# Patient Record
Sex: Female | Born: 1935
Health system: Southern US, Community
[De-identification: ages and names within clinical notes are randomized; demographics above are authoritative.]

## PROBLEM LIST (undated history)

## (undated) DIAGNOSIS — M722 Plantar fascial fibromatosis: Secondary | ICD-10-CM

## (undated) DIAGNOSIS — J45909 Unspecified asthma, uncomplicated: Secondary | ICD-10-CM

## (undated) DIAGNOSIS — I5189 Other ill-defined heart diseases: Secondary | ICD-10-CM

## (undated) DIAGNOSIS — E785 Hyperlipidemia, unspecified: Secondary | ICD-10-CM

## (undated) DIAGNOSIS — I2510 Atherosclerotic heart disease of native coronary artery without angina pectoris: Secondary | ICD-10-CM

## (undated) DIAGNOSIS — Z780 Asymptomatic menopausal state: Secondary | ICD-10-CM

## (undated) DIAGNOSIS — M1990 Unspecified osteoarthritis, unspecified site: Secondary | ICD-10-CM

## (undated) DIAGNOSIS — K219 Gastro-esophageal reflux disease without esophagitis: Secondary | ICD-10-CM

## (undated) DIAGNOSIS — L920 Granuloma annulare: Secondary | ICD-10-CM

## (undated) DIAGNOSIS — N1832 Chronic kidney disease, stage 3b: Secondary | ICD-10-CM

## (undated) DIAGNOSIS — R0600 Dyspnea, unspecified: Secondary | ICD-10-CM

## (undated) DIAGNOSIS — I480 Paroxysmal atrial fibrillation: Secondary | ICD-10-CM

## (undated) DIAGNOSIS — I10 Essential (primary) hypertension: Secondary | ICD-10-CM

## (undated) HISTORY — DX: Paroxysmal atrial fibrillation: I48.0

## (undated) HISTORY — DX: Plantar fascial fibromatosis: M72.2

## (undated) HISTORY — DX: Other ill-defined heart diseases: I51.89

## (undated) HISTORY — PX: APPENDECTOMY: SHX54

## (undated) HISTORY — DX: Granuloma annulare: L92.0

## (undated) HISTORY — DX: Atherosclerotic heart disease of native coronary artery without angina pectoris: I25.10

## (undated) HISTORY — DX: Hyperlipidemia, unspecified: E78.5

## (undated) HISTORY — PX: CATARACT EXTRACTION, BILATERAL: SHX1313

## (undated) HISTORY — PX: DILATION AND CURETTAGE OF UTERUS: SHX78

## (undated) HISTORY — DX: Essential (primary) hypertension: I10

## (undated) HISTORY — PX: VENOUS ABLATION: SHX2656

## (undated) HISTORY — DX: Asymptomatic menopausal state: Z78.0

---

## 2003-05-07 ENCOUNTER — Other Ambulatory Visit: Payer: Self-pay

## 2004-06-08 ENCOUNTER — Encounter: Payer: Self-pay | Admitting: Rheumatology

## 2004-07-06 ENCOUNTER — Encounter: Payer: Self-pay | Admitting: Rheumatology

## 2004-08-06 ENCOUNTER — Encounter: Payer: Self-pay | Admitting: Rheumatology

## 2005-04-02 ENCOUNTER — Ambulatory Visit: Payer: Self-pay | Admitting: Endocrinology

## 2005-07-08 ENCOUNTER — Ambulatory Visit: Payer: Self-pay | Admitting: Endocrinology

## 2005-07-11 ENCOUNTER — Other Ambulatory Visit: Payer: Self-pay

## 2005-07-11 ENCOUNTER — Emergency Department: Payer: Self-pay | Admitting: Unknown Physician Specialty

## 2005-07-12 ENCOUNTER — Ambulatory Visit: Payer: Self-pay | Admitting: General Practice

## 2005-08-03 ENCOUNTER — Inpatient Hospital Stay: Payer: Self-pay | Admitting: Endocrinology

## 2006-11-25 LAB — HM COLONOSCOPY: HM Colonoscopy: NORMAL

## 2007-08-11 ENCOUNTER — Ambulatory Visit: Payer: Self-pay | Admitting: Podiatry

## 2008-03-08 HISTORY — PX: SPINE SURGERY: SHX786

## 2008-06-16 ENCOUNTER — Encounter: Admission: RE | Admit: 2008-06-16 | Discharge: 2008-06-16 | Payer: Self-pay | Admitting: Neurosurgery

## 2008-07-04 ENCOUNTER — Inpatient Hospital Stay (HOSPITAL_COMMUNITY): Admission: RE | Admit: 2008-07-04 | Discharge: 2008-07-08 | Payer: Self-pay | Admitting: Neurosurgery

## 2009-03-05 ENCOUNTER — Ambulatory Visit: Payer: Self-pay | Admitting: Internal Medicine

## 2009-03-20 ENCOUNTER — Ambulatory Visit: Payer: Self-pay | Admitting: Internal Medicine

## 2009-07-15 ENCOUNTER — Ambulatory Visit: Payer: Self-pay | Admitting: Ophthalmology

## 2010-01-26 ENCOUNTER — Ambulatory Visit: Payer: Self-pay | Admitting: Internal Medicine

## 2010-01-27 LAB — HM PAP SMEAR

## 2010-06-16 LAB — URINALYSIS, ROUTINE W REFLEX MICROSCOPIC
Bilirubin Urine: NEGATIVE
Glucose, UA: NEGATIVE mg/dL
Hgb urine dipstick: NEGATIVE
Ketones, ur: NEGATIVE mg/dL
Nitrite: NEGATIVE
Protein, ur: NEGATIVE mg/dL
Specific Gravity, Urine: 1.029 (ref 1.005–1.030)
Urobilinogen, UA: 1 mg/dL (ref 0.0–1.0)
pH: 6 (ref 5.0–8.0)

## 2010-06-16 LAB — URINE MICROSCOPIC-ADD ON

## 2010-06-16 LAB — URINE CULTURE
Colony Count: NO GROWTH
Culture: NO GROWTH

## 2010-06-17 LAB — DIFFERENTIAL
Basophils Absolute: 0 10*3/uL (ref 0.0–0.1)
Eosinophils Absolute: 0.1 10*3/uL (ref 0.0–0.7)
Eosinophils Relative: 2 % (ref 0–5)
Lymphocytes Relative: 19 % (ref 12–46)
Lymphs Abs: 1.3 10*3/uL (ref 0.7–4.0)
Neutrophils Relative %: 71 % (ref 43–77)

## 2010-06-17 LAB — PROTIME-INR
INR: 1 (ref 0.00–1.49)
Prothrombin Time: 13.1 seconds (ref 11.6–15.2)

## 2010-06-17 LAB — CBC
HCT: 37.5 % (ref 36.0–46.0)
Hemoglobin: 13.1 g/dL (ref 12.0–15.0)
MCHC: 35 g/dL (ref 30.0–36.0)
MCV: 89.5 fL (ref 78.0–100.0)
RBC: 4.19 MIL/uL (ref 3.87–5.11)
WBC: 6.6 10*3/uL (ref 4.0–10.5)

## 2010-06-17 LAB — URINALYSIS, ROUTINE W REFLEX MICROSCOPIC
Bilirubin Urine: NEGATIVE
Glucose, UA: NEGATIVE mg/dL
Hgb urine dipstick: NEGATIVE
Specific Gravity, Urine: 1.01 (ref 1.005–1.030)
pH: 7 (ref 5.0–8.0)

## 2010-06-17 LAB — COMPREHENSIVE METABOLIC PANEL
CO2: 26 mEq/L (ref 19–32)
Calcium: 8.9 mg/dL (ref 8.4–10.5)
Chloride: 106 mEq/L (ref 96–112)
Creatinine, Ser: 1.05 mg/dL (ref 0.4–1.2)
GFR calc non Af Amer: 52 mL/min — ABNORMAL LOW (ref >60)
Glucose, Bld: 101 mg/dL — ABNORMAL HIGH (ref 70–99)
Total Bilirubin: 0.7 mg/dL (ref 0.3–1.2)

## 2010-07-21 NOTE — Op Note (Signed)
NAMEESTALEE, MCCANDLISH                ACCOUNT NO.:  [id number]   MEDICAL RECORD NO.:  [id number]          PATIENT TYPE:  INP   LOCATION:  3017                         FACILITY:  [facility]   PHYSICIAN:  Clydene Fake, M.D.  DATE OF BIRTH:  01-29-1936   DATE OF PROCEDURE:  07/04/2008  DATE OF DISCHARGE:                               OPERATIVE REPORT   PREOPERATIVE DIAGNOSES:  Lumbar stenosis, spondylosis, herniated nucleus  pulposus L3-4 and 4-5 with radiculopathy.   POSTOPERATIVE DIAGNOSES:  Lumbar stenosis, spondylosis, herniated  nucleus pulposus L3-4 and 4-5 with radiculopathy.   PROCEDURES:  Decompressive laminectomy decompressing the L3, L4, and L5  roots bilaterally (3 levels), left.  Bilateral L4-5 diskectomy,  microdissection with microscope.   SURGEON:  Clydene Fake, MD   ASSISTANT:  Hilda Lias, MD   ANESTHESIA:  General endotracheal tube anesthesia.   ESTIMATED BLOOD LOSS:  200 mL.   BLOOD GIVEN:  None.   DRAINS:  None.   COMPLICATIONS:  Dural tear occurred left side L4-5, repaired with  Tisseel tissue glue.   REASON FOR PROCEDURE:  The patient is a 75 year old woman with back and  leg pain, worse than the right, who has lumbar stenosis, spondylosis L3-  4 and L4-5 with a large chronic spondylotic disk protrusion at L4-5  centrally, also enhanced the stenosis with newer free fragment to the  right side causing a compression, the patient brought in for  decompression.   PROCEDURE IN DETAIL:  The patient was brought into the operating room  and general anesthesia was induced.  The patient was placed in prone  position on Wilson frame with all pressure points padded.  Needle was  placed in interspace.  X-rays obtained showing the needle was pointed at  the L4 spinous process.  An incision was then made centered where the  needle was.  Incision was taken down to the fascia.  Hemostasis was  obtained with Bovie cauterization.  The fascia was incised, L3, 4, and  5  spinous process and subperiosteal dissection was done over the 3, 4, and  5 spinous processes and lamina out to the facets bilaterally.  Self-  retaining retractor was placed.  Markers were placed at the 3-4 and 4-5  interspaces.  X-rays were obtained and this confirmed our positioning.  Starting at the left side, decompressive hemilaminectomies were started  first at 3-4 and 4-5.  A high-speed drill was used to remove the lamina  of medial facet and this completed with Kerrison punch, and ligamentum  flavum was removed to decompress the central canal.  Anterior  foraminotomy was done over the inferior root, it was done both at the 3-  4 and 4-5 space and then explored the nerve roots at 3-4 and 4-5 roots.  Starting at 3 and 4, roots were well decompressed and 4 was well  decompressed and 5 was impinged from the disk herniation there.  We  carefully dissected down the epidural space and disk space.  Large  subligamentous disk herniation seen and incised the disk space starting  the diskectomy.  This area was  very tight due to the central disk  herniation and as we started removing some disk material.  Retractor  caused a small tear in the dura and CSF was obtained.  When we removed  the retractor, no further CSF was seen.  We explored the area more and  saw the small dural tear, it was either a partial-thickness or the  arachnoid was left laying against it and no further CSF into this area.  At this point, we packed that with Gelfoam.  We went to the right side  and there we continued with the decompressive laminotomies at 3-4 and 4-  5.  Just like the opposite side, a high-speed drill with Kerrison  punches removing hypertrophic ligament of medial facets at 3-4 level.  We had good decompression of central canal and the 3 and 4 roots at 4-5  level.  We got good decompression of the central canal.  We did a wide  foraminotomy inferiorly at the L5 lamina and over the 5 root because it   was under significant compression, and 4 was decompressed, explored the  epidural space, and a subligamentous disk herniation and feeling up  under the L5 nerve root was a free fragment disk though this was  difficult to mobilize, and incising the disk space, we started  diskectomy and then reached up onto the L5 root and we were able to pull  fragments of disk out and removing this free fragment, we loosened up  the dura and then the nerve roots, we could retract a little bit easier.  We used curettes, Kerrison punches, and pituitary rongeurs to continue  pushing disk material back down the disk space, removing the disk, and  performing the diskectomy.  When we were finished, we had good central  decompression, good decompression of all canal, and the 4 and 5 roots on  the left side.  Good hemostasis with Gelfoam and thrombin.  This was  then irrigated out.  We turned our attention back to the right side and  explored 4-5 epidural space, 5 root was well decompressed, central canal  decompressed along with the L4 root, saw the dural tear.  Again, no CSF  was coming out of this.  It was either partial-thickness or it was  thought that some arachnoid compressing up against it.  We placed  Tisseel tissue glue along the area to seal the area further.  The  retractor was removed.  We had good hemostasis.  The fascia was closed  with 0 Vicryl interrupted sutures.  Subcutaneous tissue was closed with  0, 2-0, and 3-0 Vicryl interrupted sutures.  Skin was closed with  benzoin and Steri-Strips.  Dressing was placed.  The patient was placed  back in supine position, awakened from anesthesia, and transferred to  recovery room in stable condition.           ______________________________  Clydene Fake, M.D.     JRH/MEDQ  D:  07/04/2008  T:  07/05/2008  Job:  [id number]

## 2010-07-21 NOTE — Discharge Summary (Signed)
Jamie Anderson, Jamie Anderson                ACCOUNT NO.:  [id number]   MEDICAL RECORD NO.:  [id number]          PATIENT TYPE:  INP   LOCATION:  3017                         FACILITY:  [facility]   PHYSICIAN:  Hilda Lias, M.D.   DATE OF BIRTH:  December 03, 1935   DATE OF ADMISSION:  07/04/2008  DATE OF DISCHARGE:  07/08/2008                               DISCHARGE SUMMARY   ADMISSION DIAGNOSIS:  L3-4, 4-5 stenosis with a herniated disk.   FINAL DIAGNOSIS:  L3-4, 4-5 stenosis with a herniated disk.   CLINICAL HISTORY:  The patient was admitted because of back pain with  radiation to both legs.  The patient has failed conservative treatment.  Surgery was advised.  Laboratory normal.   COURSE IN THE HOSPITAL:  The patient was taken to surgery and  decompression laminectomy at L3, 4, 5 with diskectomy at 4-5 was done.  Today, she is doing well, has no headache.  She is ambulating, and she  is ready to go home.   CONDITION ON DISCHARGE:  Improvement.   MEDICATIONS:  Percocet, diazepam.   DIET:  Regular.   ACTIVITY:  Not to drive for at least 2 weeks.   FOLLOWUP:  To call Dr. Phoebe Perch for a followup appointment in 2-3 weeks.           ______________________________  Hilda Lias, M.D.     EB/MEDQ  D:  07/08/2008  T:  07/08/2008  Job:  [id number]

## 2010-11-20 ENCOUNTER — Telehealth: Payer: Self-pay | Admitting: Internal Medicine

## 2010-11-20 ENCOUNTER — Ambulatory Visit: Payer: Medicare Other

## 2010-11-20 DIAGNOSIS — N390 Urinary tract infection, site not specified: Secondary | ICD-10-CM

## 2010-11-20 LAB — POCT URINALYSIS DIPSTICK
Ketones, UA: NEGATIVE
Protein, UA: NEGATIVE
Spec Grav, UA: 1.015
Urobilinogen, UA: 1
pH, UA: 6.5

## 2010-11-20 MED ORDER — CIPROFLOXACIN HCL 250 MG PO TABS: 250.0000 mg | ORAL_TABLET | Freq: Two times a day (BID) | ORAL | Status: DC

## 2010-11-20 NOTE — Telephone Encounter (Signed)
Patient dropped off a urine specimen, she wanted me to ask you if you could call something in before the weekend.   I do not know the symptoms she was having because she did not make the appt with me.  Please advise.

## 2010-11-21 ENCOUNTER — Other Ambulatory Visit: Payer: Self-pay | Admitting: *Deleted

## 2010-11-21 DIAGNOSIS — N390 Urinary tract infection, site not specified: Secondary | ICD-10-CM

## 2010-11-21 MED ORDER — CIPROFLOXACIN HCL 250 MG PO TABS: 250.0000 mg | ORAL_TABLET | Freq: Two times a day (BID) | ORAL | Status: DC

## 2010-11-21 MED ORDER — CIPROFLOXACIN HCL 250 MG PO TABS: 250.0000 mg | ORAL_TABLET | Freq: Two times a day (BID) | ORAL | Status: AC

## 2010-11-21 NOTE — Telephone Encounter (Signed)
Patient apparently dropped off urine specimen at the [facility] office yesterday, but there was no pharmacy on file for the patient. She called today to advise her pharmacy. Abx that were ordered yesterday by Dr. Darrick Huntsman were sent electronically to [facility] as requested by the patient.

## 2010-11-21 NOTE — Telephone Encounter (Signed)
Resent electronically. Printed in error initially.

## 2010-11-21 NOTE — Telephone Encounter (Signed)
Rx for cipro faxed to [facility].

## 2010-11-21 NOTE — Telephone Encounter (Signed)
Resent electronically. 

## 2010-11-22 DIAGNOSIS — N390 Urinary tract infection, site not specified: Secondary | ICD-10-CM | POA: Insufficient documentation

## 2010-11-22 NOTE — Assessment & Plan Note (Signed)
Symptomatic, positive UA.    Will treat  Empirically with ciprofloxacin.

## 2010-11-24 LAB — URINE CULTURE

## 2011-01-05 ENCOUNTER — Other Ambulatory Visit: Payer: Self-pay | Admitting: Internal Medicine

## 2011-01-05 MED ORDER — LOSARTAN POTASSIUM-HCTZ 100-25 MG PO TABS: 1.0000 | ORAL_TABLET | Freq: Every day | ORAL | Status: DC

## 2011-01-06 ENCOUNTER — Other Ambulatory Visit: Payer: Self-pay | Admitting: Internal Medicine

## 2011-01-06 MED ORDER — LOSARTAN POTASSIUM-HCTZ 100-25 MG PO TABS: 1.0000 | ORAL_TABLET | Freq: Every day | ORAL | Status: DC

## 2011-01-29 ENCOUNTER — Encounter: Payer: Medicare Other | Admitting: Internal Medicine

## 2011-03-15 DIAGNOSIS — H2510 Age-related nuclear cataract, unspecified eye: Secondary | ICD-10-CM | POA: Diagnosis not present

## 2011-05-11 ENCOUNTER — Encounter: Payer: Medicare Other | Admitting: Internal Medicine

## 2011-05-11 DIAGNOSIS — L989 Disorder of the skin and subcutaneous tissue, unspecified: Secondary | ICD-10-CM | POA: Diagnosis not present

## 2011-05-11 DIAGNOSIS — L820 Inflamed seborrheic keratosis: Secondary | ICD-10-CM | POA: Diagnosis not present

## 2011-05-25 ENCOUNTER — Encounter: Payer: Self-pay | Admitting: Internal Medicine

## 2011-05-25 ENCOUNTER — Ambulatory Visit (INDEPENDENT_AMBULATORY_CARE_PROVIDER_SITE_OTHER): Payer: Medicare Other | Admitting: Internal Medicine

## 2011-05-25 VITALS — BP 120/80 | HR 78 | Temp 97.7°F | Wt 195.0 lb

## 2011-05-25 DIAGNOSIS — M25519 Pain in unspecified shoulder: Secondary | ICD-10-CM | POA: Diagnosis not present

## 2011-05-25 DIAGNOSIS — F320 Major depressive disorder, single episode, mild: Secondary | ICD-10-CM | POA: Insufficient documentation

## 2011-05-25 DIAGNOSIS — E785 Hyperlipidemia, unspecified: Secondary | ICD-10-CM | POA: Insufficient documentation

## 2011-05-25 DIAGNOSIS — F329 Major depressive disorder, single episode, unspecified: Secondary | ICD-10-CM

## 2011-05-25 DIAGNOSIS — I10 Essential (primary) hypertension: Secondary | ICD-10-CM | POA: Insufficient documentation

## 2011-05-25 DIAGNOSIS — M898X1 Other specified disorders of bone, shoulder: Secondary | ICD-10-CM

## 2011-05-25 NOTE — Assessment & Plan Note (Signed)
She will return for fasting lipids and CMEt since she is taking a statin .

## 2011-05-25 NOTE — Assessment & Plan Note (Addendum)
Her pain has been present for over  6 months,  Not aggravated by motion  She has had an ultrasound in the past which was normal.  Will review and repeat vs do HIDA scan followed by  CT abdonen and pelvis

## 2011-05-25 NOTE — Progress Notes (Signed)
Patient ID: Jamie Anderson, female   DOB: 04-18-1935, 76 y.o.   MRN: [id number]  Patient Active Problem List  Diagnoses  . Urinary tract infection  . Other and unspecified hyperlipidemia  . Hypertension  . Depression  . Pain of right scapula    Subjective:  CC:   No chief complaint on file.   HPI:   Jamie Anderson a 76 y.o. female who presents with Right shoulder pain.   starts in shoulder blade,  at times radiates down his right arm.  Worried about her gallbladder being the source of her pain.  Her pain is constant but becomes more intense after meals at times,  Not sharp. Not bothered by exercise or range of motion.  2) Persistent Left lateral thigh rash which has been present for months,  Itchy, not palpale   Past Medical History  Diagnosis Date  . Hyperlipidemia   . Hypertension     Past Surgical History  Procedure Date  . Abdominal hysterectomy   . Spine surgery 2010         The following portions of the patient's history were reviewed and updated as appropriate: Allergies, current medications, and problem list.    Review of Systems:   12 Pt  review of systems was negative except those addressed in the HPI,     History   Social History  . Marital Status: Married    Spouse Name: N/A    Number of Children: N/A  . Years of Education: N/A   Occupational History  . Not on file.   Social History Main Topics  . Smoking status: Never Smoker   . Smokeless tobacco: Not on file  . Alcohol Use:   . Drug Use:   . Sexually Active:    Other Topics Concern  . Not on file   Social History Narrative  . No narrative on file    Objective:  BP 120/80  Pulse 78  Temp(Src) 97.7 F (36.5 C) (Oral)  Wt 195 lb (88.451 kg)  General appearance: alert, cooperative and appears stated age Ears: normal TM's and external ear canals both ears Throat: lips, mucosa, and tongue normal; teeth and gums normal Neck: no adenopathy, no carotid bruit, supple, symmetrical,  trachea midline and thyroid not enlarged, symmetric, no tenderness/mass/nodules Back: symmetric, no curvature. ROM normal. No CVA tenderness. Lungs: clear to auscultation bilaterally Heart: regular rate and rhythm, S1, S2 normal, no murmur, click, rub or gallop Abdomen: soft, non-tender; bowel sounds normal; no masses,  no organomegaly Pulses: 2+ and symmetric Skin: Skin color, texture, turgor normal. No rashes or lesions Lymph nodes: Cervical, supraclavicular, and axillary nodes normal.  Assessment and Plan:  Pain of right scapula Her pain has been present for over  6 months,  Not aggravated by motion  She has had an ultrasound in the past which was normal.  Will review and repeat vs do HIDA scan followed by  CT abdonen and pelvis  Other and unspecified hyperlipidemia She will return for fasting lipids and CMEt since she is taking a statin .     Updated Medication List Outpatient Encounter Prescriptions as of 05/25/2011  Medication Sig Dispense Refill  . citalopram (CELEXA) 20 MG tablet Take 20 mg by mouth daily.       Marland Kitchen losartan-hydrochlorothiazide (HYZAAR) 100-25 MG per tablet Take 1 tablet by mouth daily.  90 tablet  3  . omeprazole (PRILOSEC OTC) 20 MG tablet Take 20 mg by mouth daily.      Marland Kitchen  simvastatin (ZOCOR) 40 MG tablet          No orders of the defined types were placed in this encounter.    No Follow-up on file.

## 2011-05-25 NOTE — Patient Instructions (Addendum)
Your dry patch appears to be extremely dry skin.  Try a better moisturizer , for example  Eucerin or Lac Hydrin, twice daily for one week or two   I will review your prior ultrasound of the liver and repeat imaging and functional studies  ________________________________________________________________________________  Return for fasting lipids at your leisure ______________________________________________________________________________________  Consider the Low Glycemic Index Diet and 6 smaller meals daily to help manage your weight,  Triglycerides, sugar and cholesterol:   7 AM Low carbohydrate Protein  Shakes (EAS Carb Control  Or Atkins ,  Available everywhere,   In  cases at [employer] )  2.5 carbs  (Add or substitute a toasted sandwhich thin w/ peanut butter)  10 AM: Protein bar by Atkins (snack size,  Chocolate lover's variety at  [employer])    Lunch: sandwich on pita bread or flatbread (Joseph's makes a pita bread and a flat bread , available at [employer] and [employer]; Toufayah makes a low carb flatbread available at [employer] and [employer])   3 PM:  Mid day :  Another protein bar,  Or a  cheese stick, 1/4 cup of almonds, walnuts, pistachios, pecans, peanuts,  Macadamia nuts  6 PM  Dinner:  "mean and green:"  Meat/chicken/fish (unbreaded), salad, and green veggie : use ranch, vinagrette,  Blue cheese, etc  9 PM snack : Breyer's low carb fudgiscle or  ice cream bar (Carb Smart) Weight Watcher's ice cream bar , or another protein shake

## 2011-05-26 ENCOUNTER — Other Ambulatory Visit: Payer: Self-pay | Admitting: Internal Medicine

## 2011-05-26 DIAGNOSIS — R198 Other specified symptoms and signs involving the digestive system and abdomen: Secondary | ICD-10-CM | POA: Diagnosis not present

## 2011-05-26 DIAGNOSIS — Z79899 Other long term (current) drug therapy: Secondary | ICD-10-CM | POA: Diagnosis not present

## 2011-05-26 DIAGNOSIS — G8929 Other chronic pain: Secondary | ICD-10-CM

## 2011-05-26 DIAGNOSIS — E785 Hyperlipidemia, unspecified: Secondary | ICD-10-CM | POA: Diagnosis not present

## 2011-05-26 DIAGNOSIS — R1011 Right upper quadrant pain: Secondary | ICD-10-CM

## 2011-05-27 LAB — COMPREHENSIVE METABOLIC PANEL
ALT: 10 IU/L (ref 0–32)
Albumin/Globulin Ratio: 1.8 (ref 1.1–2.5)
Albumin: 4.6 g/dL (ref 3.5–4.8)
Calcium: 9.8 mg/dL (ref 8.6–10.2)
Creatinine, Ser: 1.04 mg/dL — ABNORMAL HIGH (ref 0.57–1.00)
GFR calc Af Amer: 62 mL/min/{1.73_m2} (ref >59)
GFR calc non Af Amer: 53 mL/min/{1.73_m2} — ABNORMAL LOW (ref >59)
Globulin, Total: 2.5 g/dL (ref 1.5–4.5)
Glucose: 90 mg/dL (ref 65–99)
Total Bilirubin: 0.5 mg/dL (ref 0.0–1.2)
Total Protein: 7.1 g/dL (ref 6.0–8.5)

## 2011-05-27 LAB — LIPASE: Lipase: 80 U/L — ABNORMAL HIGH (ref 0–59)

## 2011-05-27 LAB — LIPID PANEL W/O CHOL/HDL RATIO
HDL: 89 mg/dL (ref >39)
LDL Calculated: 83 mg/dL (ref 0–99)
VLDL Cholesterol Cal: 16 mg/dL (ref 5–40)

## 2011-05-27 LAB — TSH: TSH: 2.66 u[IU]/mL (ref 0.450–4.500)

## 2011-05-28 NOTE — Progress Notes (Signed)
Addended by: Duncan Dull on: 05/28/2011 01:16 PM   Modules accepted: Orders

## 2011-05-31 ENCOUNTER — Ambulatory Visit (HOSPITAL_COMMUNITY)
Admission: RE | Admit: 2011-05-31 | Discharge: 2011-05-31 | Disposition: A | Discharge status: Home / Self Care | Payer: Medicare Other | Source: Ambulatory Visit | Attending: Internal Medicine | Admitting: Internal Medicine

## 2011-05-31 ENCOUNTER — Encounter (HOSPITAL_COMMUNITY): Payer: Self-pay

## 2011-05-31 ENCOUNTER — Other Ambulatory Visit: Payer: Self-pay | Admitting: Internal Medicine

## 2011-05-31 DIAGNOSIS — M549 Dorsalgia, unspecified: Secondary | ICD-10-CM | POA: Diagnosis not present

## 2011-05-31 DIAGNOSIS — K7689 Other specified diseases of liver: Secondary | ICD-10-CM | POA: Insufficient documentation

## 2011-05-31 DIAGNOSIS — R109 Unspecified abdominal pain: Secondary | ICD-10-CM | POA: Diagnosis not present

## 2011-05-31 DIAGNOSIS — D259 Leiomyoma of uterus, unspecified: Secondary | ICD-10-CM | POA: Diagnosis not present

## 2011-05-31 DIAGNOSIS — R1011 Right upper quadrant pain: Secondary | ICD-10-CM

## 2011-05-31 MED ORDER — IOHEXOL 300 MG/ML  SOLN
100.0000 mL | Freq: Once | INTRAMUSCULAR | Status: AC | PRN
  Administered 2011-05-31: 100 mL via INTRAVENOUS

## 2011-06-01 ENCOUNTER — Other Ambulatory Visit: Payer: Self-pay | Admitting: Internal Medicine

## 2011-06-01 DIAGNOSIS — M546 Pain in thoracic spine: Secondary | ICD-10-CM

## 2011-06-03 ENCOUNTER — Other Ambulatory Visit: Payer: Self-pay | Admitting: Internal Medicine

## 2011-06-03 MED ORDER — FLUTICASONE PROPIONATE 50 MCG/ACT NA SUSP: 2.0000 | Freq: Every day | NASAL | Status: DC

## 2011-06-11 ENCOUNTER — Encounter: Payer: Self-pay | Admitting: Internal Medicine

## 2011-06-23 ENCOUNTER — Telehealth: Payer: Self-pay | Admitting: Internal Medicine

## 2011-06-23 NOTE — Telephone Encounter (Signed)
yES THANK YOU 

## 2011-06-23 NOTE — Telephone Encounter (Signed)
Dr Darrick Huntsman i was going through the open orders and called ms Weissman  About her chest 2 view/scapula right/thoracic spine w/swimmers xrays you ordered in march.  Pt stated she was using heating pad and feels better she doesn't want to go get the xrays.  Can we cancel this order Thanks  Zella Ball

## 2011-07-07 ENCOUNTER — Encounter: Payer: Medicare Other | Admitting: Internal Medicine

## 2011-07-19 ENCOUNTER — Telehealth: Payer: Self-pay | Admitting: Internal Medicine

## 2011-07-19 MED ORDER — CITALOPRAM HYDROBROMIDE 20 MG PO TABS: 20.0000 mg | ORAL_TABLET | Freq: Every day | ORAL | Status: DC

## 2011-07-19 MED ORDER — SIMVASTATIN 40 MG PO TABS: 40.0000 mg | ORAL_TABLET | Freq: Every day | ORAL | Status: DC

## 2011-07-19 MED ORDER — ESTRADIOL 0.5 MG PO TABS: 0.5000 mg | ORAL_TABLET | Freq: Every day | ORAL | Status: DC

## 2011-07-19 NOTE — Telephone Encounter (Signed)
Patient stated she also needs a refill on her estradiol but insurance doesn't want to pat for it anymore so she has to pay over $200 for it.  She wanted to know if there was something else that could be called in instead of estradiol. Please advise.

## 2011-07-20 MED ORDER — CITALOPRAM HYDROBROMIDE 20 MG PO TABS: 20.0000 mg | ORAL_TABLET | Freq: Every day | ORAL | Status: DC

## 2011-07-20 NOTE — Telephone Encounter (Signed)
Correction.,  Ok to refill celexa ,  Not celebrex

## 2011-07-20 NOTE — Telephone Encounter (Signed)
Addended by: Jobie Quaker on: 07/20/2011 04:33 PM   Modules accepted: Orders

## 2011-07-20 NOTE — Telephone Encounter (Signed)
Please let me know about the medication.

## 2011-07-20 NOTE — Telephone Encounter (Signed)
Generic estradiol is on the $4 /month drug plan at [facility] and [facility].  There is no reason her insurance should be charging her that much unless she is getting name brand only,   Print her out new rx and [facility] it to [facility] or [facility].  Ok to refill celebrex with 3 refills

## 2011-07-23 ENCOUNTER — Telehealth: Payer: Self-pay | Admitting: Internal Medicine

## 2011-07-23 NOTE — Telephone Encounter (Signed)
Pl offer Jamie Anderson  An rx for valium.  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  5 mg one tablet every 8 hours prn muscle spasm  #30. No refills  DO NOT COMBINE WITH ALCOHOL OR DRIVING.

## 2011-07-23 NOTE — Telephone Encounter (Signed)
Caller: [place]/Patient; PCP: Ronna Polio; CB#: [phone]; ; ; Call regarding Stiff Neck for Several Days.  Started using heat for relief.  States she had fallen asleep on the sofa, and woke up stiff.  States has continued to be painful.  States has been stressful, as husband was diagnosed with cancer recently.  Taking ibuprofen 400mg  q 8-12  hours; advised to continue ibuprofen, but do 600mg  q 6-8 h prn pain.  Per protocol, emergent symptoms denied; advised appt within 72 hours.  Home care advised for over the weekend; appt sched 07/26/11 1100 with Dr. Darrick Huntsman.   Patient also asking about estrace Rx.  Per Epic, Dr. Darrick Huntsman suggested [facility] or [facility], as it is on the $4/month list.  Has not been called in to [facility] per Epic.  Per Epic, Rx for estradiol 0.5mg  tab, 1 daily, RF x 11, called in to [facility] [phone].  Patient advised confirmation of $4/month; Rx will be ready for pick up within 30 min.

## 2011-07-23 NOTE — Telephone Encounter (Signed)
Left message asking patient to return my call.

## 2011-07-26 ENCOUNTER — Encounter: Payer: Self-pay | Admitting: Internal Medicine

## 2011-07-26 ENCOUNTER — Ambulatory Visit (INDEPENDENT_AMBULATORY_CARE_PROVIDER_SITE_OTHER): Payer: Medicare Other | Admitting: Internal Medicine

## 2011-07-26 VITALS — BP 154/88 | HR 73 | Temp 98.3°F | Resp 20 | Ht 65.0 in | Wt 192.8 lb

## 2011-07-26 DIAGNOSIS — M542 Cervicalgia: Secondary | ICD-10-CM | POA: Insufficient documentation

## 2011-07-26 MED ORDER — DIAZEPAM 10 MG PO TABS: 5.0000 mg | ORAL_TABLET | Freq: Two times a day (BID) | ORAL | Status: AC | PRN

## 2011-07-26 MED ORDER — PREDNISONE (PAK) 10 MG PO TABS: ORAL_TABLET | ORAL | Status: AC

## 2011-07-26 MED ORDER — HYDROCODONE-ACETAMINOPHEN 10-325 MG PO TABS: 1.0000 | ORAL_TABLET | Freq: Three times a day (TID) | ORAL | Status: AC | PRN

## 2011-07-26 NOTE — Telephone Encounter (Signed)
If pt having unbearable 10/10 pain, should go to ED.

## 2011-07-26 NOTE — Progress Notes (Signed)
Patient ID: Jamie Anderson, female   DOB: [birth year], 76 y.o.   MRN: [id number]  Patient Active Problem List  Diagnoses  . Urinary tract infection  . Other and unspecified hyperlipidemia  . Hypertension  . Depression  . Pain of right scapula  . Cervicalgia    Subjective:  CC:   Chief Complaint  Patient presents with  . neck spasm and pain    HPI:   Jamie Anderson a 76 y.o. female who presents with a 6 day history of neck spasm accompanied by occipital and tension headaches ,  Has been treating with ibuprofen.   Her husband has recently been diagnosed with cancer of lung by CT and undwent a lung biopsy, with results pending . She is emotionally stressed and cannot sleep due to neck pain and spasm   Past Medical History  Diagnosis Date  . Hyperlipidemia   . Hypertension   . Menopause     Past Surgical History  Procedure Date  . Abdominal hysterectomy   . Spine surgery 2010  . Appendectomy   . Venous ablation     bilateral  . Dilation and curettage of uterus          The following portions of the patient's history were reviewed and updated as appropriate: Allergies, current medications, and problem list.    Review of Systems:   12 Pt  review of systems was negative except those addressed in the HPI,     History   Social History  . Marital Status: Married    Spouse Name: N/A    Number of Children: N/A  . Years of Education: N/A   Occupational History  . Not on file.   Social History Main Topics  . Smoking status: Never Smoker   . Smokeless tobacco: Not on file  . Alcohol Use:   . Drug Use:   . Sexually Active:    Other Topics Concern  . Not on file   Social History Narrative  . No narrative on file    Objective:  BP 154/88  Pulse 73  Temp(Src) 98.3 F (36.8 C) (Oral)  Resp 20  Ht 5\' 5"  (1.651 m)  Wt 192 lb 12 oz (87.431 kg)  BMI 32.08 kg/m2  SpO2 99%  General appearance: alert, cooperative and appears stated age Ears: normal TM's  and external ear canals both ears Throat: lips, mucosa, and tongue normal; teeth and gums normal Neck: no adenopathy, no carotid bruit, supple, symmetrical, trachea midline and thyroid not enlarged, symmetric, no tenderness/mass/nodules Back: symmetric, no curvature. ROM normal. No CVA tenderness. Lungs: clear to auscultation bilaterally Heart: regular rate and rhythm, S1, S2 normal, no murmur, click, rub or gallop Abdomen: soft, non-tender; bowel sounds normal; no masses,  no organomegaly Pulses: 2+ and symmetric Skin: Skin color, texture, turgor normal. No rashes or lesions Lymph nodes: Cervical, supraclavicular, and axillary nodes normal.  Assessment and Plan:  Cervicalgia With left arm pain ,  shoulder pain and headache. history of bone spur.  Prednisone taper and analgesics.     Updated Medication List Outpatient Encounter Prescriptions as of 07/26/2011  Medication Sig Dispense Refill  . citalopram (CELEXA) 20 MG tablet Take 1 tablet (20 mg total) by mouth daily.  90 tablet  3  . estradiol (ESTRACE) 0.5 MG tablet Take 1 tablet (0.5 mg total) by mouth daily.  90 tablet  3  . fluticasone (FLONASE) 50 MCG/ACT nasal spray Place 2 sprays into the nose daily.  16 g  6  .  losartan-hydrochlorothiazide (HYZAAR) 100-25 MG per tablet Take 1 tablet by mouth daily.  90 tablet  3  . omeprazole (PRILOSEC OTC) 20 MG tablet Take 20 mg by mouth daily.      . simvastatin (ZOCOR) 40 MG tablet Take 1 tablet (40 mg total) by mouth at bedtime.  90 tablet  2  . diazepam (VALIUM) 10 MG tablet Take 0.5 tablets (5 mg total) by mouth every 12 (twelve) hours as needed for anxiety.  30 tablet  0  . HYDROcodone-acetaminophen (NORCO) 10-325 MG per tablet Take 1 tablet by mouth every 8 (eight) hours as needed for pain.  30 tablet  0  . predniSONE (STERAPRED UNI-PAK) 10 MG tablet 6 tablets on Day 1 , then reduce by 1 tablet daily until gone  21 tablet  0     No orders of the defined types were placed in this  encounter.    No Follow-up on file.

## 2011-07-26 NOTE — Assessment & Plan Note (Addendum)
With left arm pain ,  shoulder pain and headache. history of bone spur.  Prednisone taper and analgesics.

## 2011-07-26 NOTE — Patient Instructions (Signed)
Stop the ibuprofren.  We will start a prednisone taper for the inflammation,   Valium as a muscle relaxer (and for anxiety) , and  vicodin for the pain

## 2011-07-26 NOTE — Telephone Encounter (Signed)
Spoke w/patient, she is "a bit better" and was instructed by our triage to come in around 10:30am for 11:00 am OV, her transportation has arrived at pt's home and given the choice of ED or PCP OV patient will continue to rest until time to come in to our office/SLS

## 2011-07-26 NOTE — Telephone Encounter (Signed)
Caller: [place]/Patient; PCP: Ronna Polio; CB#: [phone];  Call regarding Head and Neck Pain.; Relates that she called on 07/23/11; has appt. at 11:00 and states she does not want to wait that long to be seen due    Caller relates pain is 10 of 10; dulled by Ibuprofen to 8 of 10. Afebrile/subjective.  Per Neck Pain or Injury protocol patient is ED disposition due to unbearable pain.  Contacted office for direction.  Spoke with Morrie Sheldon; rec'd instructions to keep office appointment. Caller informed of same.  Home care for the interim and parameters for callback per Neck Pain or Injury protocol.

## 2011-08-02 ENCOUNTER — Encounter: Payer: Self-pay | Admitting: Internal Medicine

## 2011-08-04 ENCOUNTER — Telehealth: Payer: Self-pay | Admitting: Internal Medicine

## 2011-08-04 NOTE — Telephone Encounter (Signed)
Caller: Jamie Anderson; PCP: Duncan Dull; CB#: [phone]; ; ; Call regarding Neck Pain; 1) Cont'd neck pain/tightness. OV on 08/02/11 for same. Hydrocodone and Diazepam did reduce her pain but she stopped the meds on 08/02/11. Neck feels stiff.  "I'm just aware of my neck." 2) Mild ST onset 07/31/11. Afebrile per pt. Homecare advised per Neck Pain/ST Protocols. Call back parameters reviewed. Pt will resume her meds prn.

## 2011-09-21 ENCOUNTER — Ambulatory Visit: Payer: Medicare Other | Admitting: Internal Medicine

## 2011-09-23 ENCOUNTER — Ambulatory Visit: Payer: Medicare Other | Admitting: Internal Medicine

## 2011-09-23 DIAGNOSIS — L570 Actinic keratosis: Secondary | ICD-10-CM | POA: Diagnosis not present

## 2011-09-23 DIAGNOSIS — L259 Unspecified contact dermatitis, unspecified cause: Secondary | ICD-10-CM | POA: Diagnosis not present

## 2011-10-04 ENCOUNTER — Encounter: Payer: Self-pay | Admitting: Internal Medicine

## 2011-10-27 ENCOUNTER — Telehealth: Payer: Self-pay | Admitting: Internal Medicine

## 2011-10-27 ENCOUNTER — Other Ambulatory Visit (INDEPENDENT_AMBULATORY_CARE_PROVIDER_SITE_OTHER): Payer: Medicare Other | Admitting: *Deleted

## 2011-10-27 DIAGNOSIS — N390 Urinary tract infection, site not specified: Secondary | ICD-10-CM | POA: Diagnosis not present

## 2011-10-27 LAB — POCT URINALYSIS DIPSTICK
Blood, UA: NEGATIVE
Ketones, UA: NEGATIVE
Protein, UA: 30
Spec Grav, UA: 1.015
Urobilinogen, UA: NEGATIVE

## 2011-10-27 NOTE — Telephone Encounter (Signed)
Will call her once we have resulted this.

## 2011-10-27 NOTE — Telephone Encounter (Signed)
Error

## 2011-10-27 NOTE — Telephone Encounter (Signed)
Pt wanted you to call her cell phone with ua results

## 2011-10-29 ENCOUNTER — Other Ambulatory Visit: Payer: Self-pay | Admitting: Internal Medicine

## 2011-10-29 LAB — URINE CULTURE
Colony Count: NO GROWTH
Organism ID, Bacteria: NO GROWTH

## 2011-10-29 MED ORDER — CIPROFLOXACIN HCL 250 MG PO TABS: 250.0000 mg | ORAL_TABLET | Freq: Two times a day (BID) | ORAL | Status: AC

## 2011-11-05 DIAGNOSIS — L570 Actinic keratosis: Secondary | ICD-10-CM | POA: Diagnosis not present

## 2011-11-05 DIAGNOSIS — L259 Unspecified contact dermatitis, unspecified cause: Secondary | ICD-10-CM | POA: Diagnosis not present

## 2011-11-25 ENCOUNTER — Ambulatory Visit (INDEPENDENT_AMBULATORY_CARE_PROVIDER_SITE_OTHER): Payer: Medicare Other | Admitting: Internal Medicine

## 2011-11-25 ENCOUNTER — Encounter: Payer: Self-pay | Admitting: Internal Medicine

## 2011-11-25 VITALS — BP 128/62 | HR 80 | Temp 98.0°F | Resp 16 | Ht 64.0 in | Wt 193.5 lb

## 2011-11-25 DIAGNOSIS — Z0000 Encounter for general adult medical examination without abnormal findings: Secondary | ICD-10-CM

## 2011-11-25 DIAGNOSIS — M810 Age-related osteoporosis without current pathological fracture: Secondary | ICD-10-CM

## 2011-11-25 DIAGNOSIS — M25551 Pain in right hip: Secondary | ICD-10-CM | POA: Insufficient documentation

## 2011-11-25 DIAGNOSIS — M25559 Pain in unspecified hip: Secondary | ICD-10-CM | POA: Diagnosis not present

## 2011-11-25 DIAGNOSIS — Z23 Encounter for immunization: Secondary | ICD-10-CM

## 2011-11-25 DIAGNOSIS — Z1239 Encounter for other screening for malignant neoplasm of breast: Secondary | ICD-10-CM | POA: Diagnosis not present

## 2011-11-25 DIAGNOSIS — Z79899 Other long term (current) drug therapy: Secondary | ICD-10-CM | POA: Diagnosis not present

## 2011-11-25 LAB — COMPREHENSIVE METABOLIC PANEL
ALT: 12 U/L (ref 0–35)
Alkaline Phosphatase: 100 U/L (ref 39–117)
CO2: 27 mEq/L (ref 19–32)
Creatinine, Ser: 1 mg/dL (ref 0.4–1.2)
GFR: 61.1 mL/min (ref >60.00)
Sodium: 136 mEq/L (ref 135–145)
Total Bilirubin: 0.8 mg/dL (ref 0.3–1.2)

## 2011-11-25 MED ORDER — DICLOFENAC SODIUM 75 MG PO TBEC: 75.0000 mg | DELAYED_RELEASE_TABLET | Freq: Two times a day (BID) | ORAL | Status: DC

## 2011-11-25 MED ORDER — ESTRADIOL 0.5 MG PO TABS: 0.5000 mg | ORAL_TABLET | Freq: Every day | ORAL | Status: DC

## 2011-11-25 NOTE — Progress Notes (Signed)
Patient ID: Jamie Anderson, female   DOB: [birth year], 76 y.o.   MRN: [id number] The patient is here for annual Medicare wellness examination and management of other chronic and acute problems.   New cc: Right leg pain upper thigh for several weeks ,  Resolved then recurred .  Aggravated by internal rotatin of hip,  Also having right sided sacral pain   For the past week,  Some yard work lately .  Prior back surgery 3 yrs ago,  This pain is different.  priro pain was radiating to left lateral leg.     The risk factors are reflected in the social history.  The roster of all physicians providing medical care to patient - is listed in the Snapshot section of the chart.  Activities of daily living:  The patient is 100% independent in all ADLs: dressing, toileting, feeding as well as independent mobility  Home safety : The patient has smoke detectors in the home. They wear seatbelts.  There are no firearms at home. There is no violence in the home.   There is no risks for hepatitis, STDs or HIV. There is no   history of blood transfusion. They have no travel history to infectious disease endemic areas of the world.  The patient has seen their dentist in the last six month. They have seen their eye doctor in the last year. They admit to slight hearing difficulty with regard to whispered voices and some television programs.  They have deferred audiologic testing in the last year.  They do not  have excessive sun exposure. Discussed the need for sun protection: hats, long sleeves and use of sunscreen if there is significant sun exposure.   Diet: the importance of a healthy diet is discussed. They do have a healthy diet.  The benefits of regular aerobic exercise were discussed. She walks 4 times per week ,  20 minutes.   Depression screen: there are no signs or vegative symptoms of depression- irritability, change in appetite, anhedonia, sadness/tearfullness.  Cognitive assessment: the patient manages all  their financial and personal affairs and is actively engaged. They could relate day,date,year and events; recalled 2/3 objects at 3 minutes; performed clock-face test normally.  The following portions of the patient's history were reviewed and updated as appropriate: allergies, current medications, past family history, past medical history,  past surgical history, past social history  and problem list.  Visual acuity was not assessed per patient preference since she has regular follow up with her ophthalmologist. Hearing and body mass index were assessed and reviewed.   During the course of the visit the patient was educated and counseled about appropriate screening and preventive services including : fall prevention , diabetes screening, nutrition counseling, colorectal cancer screening, and recommended immunizations.    General appearance: alert, cooperative and appears stated age Ears: normal TM's and external ear canals both ears Throat: lips, mucosa, and tongue normal; teeth and gums normal Neck: no adenopathy, no carotid bruit, supple, symmetrical, trachea midline and thyroid not enlarged, symmetric, no tenderness/mass/nodules Back: symmetric, no curvature. ROM normal. No CVA tenderness. Breasts: symmetric, no masses or tenderness.   Lungs: clear to auscultation bilaterally Heart: regular rate and rhythm, S1, S2 normal, no murmur, click, rub or gallop Abdomen: soft, non-tender; bowel sounds normal; no masses,  no organomegaly Pulses: 2+ and symmetric MSK:  Right hip ROM painful with internal rotation.  No swelling or point tenderness. Skin: Skin color, texture, turgor normal. No rashes or lesions Lymph nodes: Cervical,  supraclavicular, and axillary nodes normal.   Assessment and Plan  Hip pain, right Symptoms appear to be arthritis versus early DJD.   we discussed an empiric Trial of diclofenac. If no improvement with use of nonsteroidals we'll start workup for her degenerative joint  disease with x-rays of hip.  Routine general medical examination at a health care facility Annularis exam was done today breast exam was done the pelvic was deferred since this was done last year.   Updated Medication List Outpatient Encounter Prescriptions as of 11/25/2011  Medication Sig Dispense Refill  . citalopram (CELEXA) 20 MG tablet Take 1 tablet (20 mg total) by mouth daily.  90 tablet  3  . estradiol (ESTRACE) 0.5 MG tablet Take 1 tablet (0.5 mg total) by mouth daily.  90 tablet  3  . fluticasone (FLONASE) 50 MCG/ACT nasal spray Place 2 sprays into the nose daily.  16 g  6  . losartan-hydrochlorothiazide (HYZAAR) 100-25 MG per tablet Take 1 tablet by mouth daily.  90 tablet  3  . simvastatin (ZOCOR) 40 MG tablet Take 1 tablet (40 mg total) by mouth at bedtime.  90 tablet  2  . DISCONTD: estradiol (ESTRACE) 0.5 MG tablet Take 1 tablet (0.5 mg total) by mouth daily.  90 tablet  3  . diclofenac (VOLTAREN) 75 MG EC tablet Take 1 tablet (75 mg total) by mouth 2 (two) times daily. As needed for joint pain  60 tablet  1  . DISCONTD: diclofenac (VOLTAREN) 75 MG EC tablet Take 1 tablet (75 mg total) by mouth 2 (two) times daily. As needed for joint pain  60 tablet  1  . DISCONTD: omeprazole (PRILOSEC OTC) 20 MG tablet Take 20 mg by mouth daily.

## 2011-11-25 NOTE — Assessment & Plan Note (Addendum)
Symptoms appear to be arthritis versus early DJD.   we discussed an empiric Trial of diclofenac. If no improvement with use of nonsteroidals we'll start workup for her degenerative joint disease with x-rays of hip.

## 2011-11-25 NOTE — Patient Instructions (Addendum)
I am prescribing diclofenac which is a strong anti-inflammatory to help with your hip pain.   if it does not improve with using this medication daily for 2 weeks we will image your r hip to see if you have degenerative  Joint disease. Use okay to ibuprofen or Motrin    You have received the influenza  and the Pneumovax vaccines today.  You still need the tetanus/diphtheria/pertussis vaccine (Tdap). It costs $110 to get it done here. I suggest you call the health department to see if they will give it to you for a lesser price.  We will set you up for a mammogram and a DEXA scan at [facility].

## 2011-11-26 DIAGNOSIS — Z0000 Encounter for general adult medical examination without abnormal findings: Secondary | ICD-10-CM | POA: Insufficient documentation

## 2011-11-26 NOTE — Assessment & Plan Note (Signed)
Annularis exam was done today breast exam was done the pelvic was deferred since this was done last year.

## 2011-11-29 ENCOUNTER — Encounter: Payer: Self-pay | Admitting: *Deleted

## 2011-11-29 ENCOUNTER — Emergency Department: Payer: Self-pay | Admitting: Emergency Medicine

## 2011-11-29 ENCOUNTER — Telehealth: Payer: Self-pay | Admitting: Internal Medicine

## 2011-11-29 DIAGNOSIS — R109 Unspecified abdominal pain: Secondary | ICD-10-CM | POA: Diagnosis not present

## 2011-11-29 DIAGNOSIS — I10 Essential (primary) hypertension: Secondary | ICD-10-CM | POA: Diagnosis not present

## 2011-11-29 DIAGNOSIS — R079 Chest pain, unspecified: Secondary | ICD-10-CM | POA: Diagnosis not present

## 2011-11-29 DIAGNOSIS — K219 Gastro-esophageal reflux disease without esophagitis: Secondary | ICD-10-CM | POA: Diagnosis not present

## 2011-11-29 DIAGNOSIS — R1013 Epigastric pain: Secondary | ICD-10-CM | POA: Diagnosis not present

## 2011-11-29 DIAGNOSIS — R1011 Right upper quadrant pain: Secondary | ICD-10-CM | POA: Diagnosis not present

## 2011-11-29 DIAGNOSIS — E785 Hyperlipidemia, unspecified: Secondary | ICD-10-CM | POA: Diagnosis not present

## 2011-11-29 DIAGNOSIS — Z9089 Acquired absence of other organs: Secondary | ICD-10-CM | POA: Diagnosis not present

## 2011-11-29 LAB — COMPREHENSIVE METABOLIC PANEL
Albumin: 3.4 g/dL (ref 3.4–5.0)
Alkaline Phosphatase: 115 U/L (ref 50–136)
Anion Gap: 10 (ref 7–16)
Calcium, Total: 9.2 mg/dL (ref 8.5–10.1)
Chloride: 106 mmol/L (ref 98–107)
Co2: 26 mmol/L (ref 21–32)
EGFR (Non-African Amer.): 51 — ABNORMAL LOW
Glucose: 92 mg/dL (ref 65–99)
Osmolality: 288 (ref 275–301)
SGOT(AST): 19 U/L (ref 15–37)
Sodium: 142 mmol/L (ref 136–145)

## 2011-11-29 LAB — CBC
HCT: 38.3 % (ref 35.0–47.0)
HGB: 13.2 g/dL (ref 12.0–16.0)
MCH: 30.1 pg (ref 26.0–34.0)
MCHC: 34.5 g/dL (ref 32.0–36.0)
RBC: 4.39 10*6/uL (ref 3.80–5.20)

## 2011-11-29 LAB — CK TOTAL AND CKMB (NOT AT ARMC): CK, Total: 76 U/L (ref 21–215)

## 2011-11-29 NOTE — Telephone Encounter (Signed)
EMERGENT CALL: Caller: Costella/Patient; Patient Name: Jamie Anderson; PCP: Duncan Dull (Adults only); Best Callback Phone Number: [phone]; Reason for call: Chest Pain/Chest Discomfort; pt denies chest pain just states that she is having some heartburn; symptom started 11/26/11; was seen in the office on 11/25/11 and was feeling okay; new arthritis medication was started on 11/25/11; at present time pt is complaining of shortness of breath; chest tightness noted now as well; symptoms started as soon as she got up at 0900;  took Pepto and not helping; Triaged per Chest Pain Guideline; call 911 due to pressure, fullness or pain lasting 5 minutes or more  minutes now or wiothin the last hour; refusing 911; pt will go to [facility] via husband driving; instructed to chew ASA 325mg  now; will comply and go to ED now; office called and made aware of sending patient per practice orders

## 2011-12-02 ENCOUNTER — Telehealth: Payer: Self-pay | Admitting: Internal Medicine

## 2011-12-02 MED ORDER — DEXLANSOPRAZOLE 60 MG PO CPDR: 60.0000 mg | DELAYED_RELEASE_CAPSULE | Freq: Every day | ORAL | Status: DC

## 2011-12-02 NOTE — Telephone Encounter (Signed)
Spoke with patient via telephone, she stated that she is not having chest pain but a burning in her chest that feels like acid reflux.  She stated that she doesn't have pain in her arm.  She thinks that her discomfort is coming from starting Diclofenac.  She has not taking it today and she wants to stop it altogether.  She stated that she was not going to the ED.

## 2011-12-02 NOTE — Telephone Encounter (Signed)
Spoke with patient.  EKG and ER labs reviewed,  Nothing to suggest cardiac issue.  Will treat for gastritis with samples of Dexilant for 2 weeks.  If no better, make appt for further workup.  Samples set aside for patient

## 2011-12-02 NOTE — Telephone Encounter (Signed)
Caller: Vale/Patient is calling with a question about chest burning.  RN call back made to check on caller with burning/aching substernal chest pain who refused 911. Reluctant to go back to ED since she was there 11/29/11 for the day and diagnosed with heartburn.  Reports constant chest discomfort with belching and feeling poorly since 11/26/11.  MD stared her on new RX, Dicloranac 11/26/11 for hip pain.  RN stressed importance of immediate care for evaluation of heart muscle.  Discussed risk of cardiac irregularities, heart damage and even sudden  death without treatment.  Remains reluctant to go now; stated "will see how I feel later" before decides if will return to ED.  Information noted and sent to [facility] for follow up to recent contact, no triage required per Information Only Call guideline.

## 2011-12-02 NOTE — Telephone Encounter (Signed)
Caller: Lechelle/Patient; Patient Name: Jamie Anderson; PCP: Duncan Dull (Adults only); Best Callback Phone Number: [phone].  Reports constant burning/aching substernal chest that radiates to Right arm.  No diaphorsis.  Mild shortness of breath she relates to asthma.  Has not felt well all week with > fatigue. Called 11/29/11 seen in [place] ED; cardiac issues ruled out; diagnosed with GERD and treated with Zantac BID.  Started new RX for Diclofanac 11/26/11 for hip pain. Advised to call 911 now for chest pain lasting > 5 min now per Chest Pain guideline. Caller reluctant to go back "and spend all day in the ED"; uncertian if she will call 911 or go to ED.  Call back scheduled for 10 minutes.

## 2011-12-06 DIAGNOSIS — E2839 Other primary ovarian failure: Secondary | ICD-10-CM | POA: Diagnosis not present

## 2011-12-06 DIAGNOSIS — N951 Menopausal and female climacteric states: Secondary | ICD-10-CM | POA: Diagnosis not present

## 2011-12-06 DIAGNOSIS — N958 Other specified menopausal and perimenopausal disorders: Secondary | ICD-10-CM | POA: Diagnosis not present

## 2011-12-06 DIAGNOSIS — Z1231 Encounter for screening mammogram for malignant neoplasm of breast: Secondary | ICD-10-CM | POA: Diagnosis not present

## 2011-12-06 LAB — HM DEXA SCAN: HM Dexa Scan: NORMAL

## 2011-12-07 ENCOUNTER — Encounter: Payer: Self-pay | Admitting: Internal Medicine

## 2011-12-14 ENCOUNTER — Encounter: Payer: Self-pay | Admitting: Internal Medicine

## 2012-01-21 ENCOUNTER — Other Ambulatory Visit: Payer: Self-pay | Admitting: Internal Medicine

## 2012-01-21 MED ORDER — LOSARTAN POTASSIUM-HCTZ 100-25 MG PO TABS: 1.0000 | ORAL_TABLET | Freq: Every day | ORAL | Status: DC

## 2012-01-21 NOTE — Telephone Encounter (Signed)
Refill request for Losartan 100-25 mg. #90 3 R sent electronic to [facility].

## 2012-01-21 NOTE — Telephone Encounter (Signed)
Pt is needing refills on Losartin for b/p. She uses [facility]. She almost completely out and will need that as soon as possible.

## 2012-02-15 DIAGNOSIS — L259 Unspecified contact dermatitis, unspecified cause: Secondary | ICD-10-CM | POA: Diagnosis not present

## 2012-02-15 DIAGNOSIS — L738 Other specified follicular disorders: Secondary | ICD-10-CM | POA: Diagnosis not present

## 2012-03-16 DIAGNOSIS — H2510 Age-related nuclear cataract, unspecified eye: Secondary | ICD-10-CM | POA: Diagnosis not present

## 2012-03-21 DIAGNOSIS — R21 Rash and other nonspecific skin eruption: Secondary | ICD-10-CM | POA: Diagnosis not present

## 2012-03-21 DIAGNOSIS — L299 Pruritus, unspecified: Secondary | ICD-10-CM | POA: Diagnosis not present

## 2012-03-21 DIAGNOSIS — L538 Other specified erythematous conditions: Secondary | ICD-10-CM | POA: Diagnosis not present

## 2012-03-28 DIAGNOSIS — L538 Other specified erythematous conditions: Secondary | ICD-10-CM | POA: Diagnosis not present

## 2012-04-03 ENCOUNTER — Telehealth: Payer: Self-pay | Admitting: Internal Medicine

## 2012-04-03 ENCOUNTER — Ambulatory Visit (INDEPENDENT_AMBULATORY_CARE_PROVIDER_SITE_OTHER): Payer: Medicare Other | Admitting: Adult Health

## 2012-04-03 ENCOUNTER — Encounter: Payer: Self-pay | Admitting: Adult Health

## 2012-04-03 VITALS — BP 150/84 | HR 89 | Temp 97.7°F | Resp 16 | Ht 65.0 in | Wt 190.0 lb

## 2012-04-03 DIAGNOSIS — M1711 Unilateral primary osteoarthritis, right knee: Secondary | ICD-10-CM

## 2012-04-03 DIAGNOSIS — M25569 Pain in unspecified knee: Secondary | ICD-10-CM | POA: Diagnosis not present

## 2012-04-03 HISTORY — DX: Unilateral primary osteoarthritis, right knee: M17.11

## 2012-04-03 MED ORDER — HYDROCODONE-ACETAMINOPHEN 5-325 MG PO TABS: 1.0000 | ORAL_TABLET | Freq: Four times a day (QID) | ORAL | Status: DC | PRN

## 2012-04-03 NOTE — Telephone Encounter (Signed)
Patient Information:  Caller Name: Braley  Phone: [phone]  Patient: Jamie Anderson, Jamie Anderson  Gender: Female  DOB: 02-Mar-1936  Age: 77 Years  PCP: Duncan Dull (Adults only)  Office Follow Up:  Does the office need to follow up with this patient?: No  Instructions For The Office: N/A   Symptoms  Reason For Call & Symptoms: Has new pain behind her left knee.  Has been sitting a lot at the hospital with her husband who is in ICU.  Limping due to the pain, area in red.    Reviewed Health History In EMR: Yes  Reviewed Medications In EMR: Yes  Reviewed Allergies In EMR: Yes  Reviewed Surgeries / Procedures: Yes  Date of Onset of Symptoms: 04/02/2012  Guideline(s) Used:  Leg Pain  Disposition Per Guideline:   Go to Office Now  Reason For Disposition Reached:   Severe pain (e.g., excruciating, unable to do any normal activities)  Advice Given:  N/A  Appointment Scheduled:  04/03/2012 09:00:00 Appointment Scheduled Provider:  Orville Govern

## 2012-04-03 NOTE — Assessment & Plan Note (Signed)
Symptoms consistent with inflammation of popliteal bursa on the left. Small amount of effusion noted on left knee. Conservative treatment with ice, ROM exercises. If no resolution of symptoms may need aspiration and/or injection of joint. Norco provided for pain relief.

## 2012-04-03 NOTE — Patient Instructions (Addendum)
  Your left knee has some fluid accumulation. This can accumulate in the back of the knee and cause additional pain.  Apply ice to the area 3-4 time per day for 20 minutes at a time.  Elevate the knee/leg several times throughout the day.  At the same time, stay mobile as tolerated.  I have prescribed some Norco for your pain. Take this with food.  If the pain persists, then we will refer you to an orthopedic doctor for possible aspiration of the fluid in the knee.

## 2012-04-03 NOTE — Progress Notes (Signed)
  Subjective:    Patient ID: Jamie Anderson, female    DOB: 03-03-1936, 77 y.o.   MRN: [id number]  HPI  Patient is a 77 y/o female patient who presents to clinic with c/o of pain behind left knee. She denies any injury to the area. Her husband is currently hospitalized at [facility] and she has been staying with him at the hospital. She reports decrease of her regular activities. Pain worse with extension, bending and ambulation. She denies pain the the calf or radiating of pain anywhere other than back of knee.  Current Outpatient Prescriptions on File Prior to Visit  Medication Sig Dispense Refill  . estradiol (ESTRACE) 0.5 MG tablet Take 1 tablet (0.5 mg total) by mouth daily.  90 tablet  3  . fluticasone (FLONASE) 50 MCG/ACT nasal spray Place 2 sprays into the nose daily.  16 g  6  . losartan-hydrochlorothiazide (HYZAAR) 100-25 MG per tablet Take 1 tablet by mouth daily.  90 tablet  3  . simvastatin (ZOCOR) 40 MG tablet Take 1 tablet (40 mg total) by mouth at bedtime.  90 tablet  2  . citalopram (CELEXA) 20 MG tablet Take 1 tablet (20 mg total) by mouth daily.  90 tablet  3  . dexlansoprazole (DEXILANT) 60 MG capsule Take 1 capsule (60 mg total) by mouth daily.  13 capsule  0      Review of Systems  Constitutional: Negative for fever and chills.  Respiratory: Negative.   Cardiovascular: Negative.   Musculoskeletal: Positive for joint swelling.       Pain behind left knee  Neurological: Negative.   Psychiatric/Behavioral: Negative.     BP 150/84  Pulse 89  Temp 97.7 F (36.5 C) (Oral)  Resp 16  Ht 5\' 5"  (1.651 m)  Wt 190 lb (86.183 kg)  BMI 31.62 kg/m2  SpO2 99%     Objective:   Physical Exam  Constitutional: She is oriented to person, place, and time. She appears well-developed and well-nourished. No distress.  Cardiovascular: Normal rate and regular rhythm.   Pulmonary/Chest: Effort normal.  Musculoskeletal: She exhibits edema and tenderness.       Positive bulge sign  left knee. No pain at joint line. No crepitus appreciated.   Neurological: She is alert and oriented to person, place, and time. Coordination normal.  Skin: No rash noted. No erythema.  Psychiatric: She has a normal mood and affect. Her behavior is normal. Judgment and thought content normal.           Assessment & Plan:

## 2012-04-03 NOTE — Telephone Encounter (Signed)
i singned the refill rx for vicodin

## 2012-04-08 HISTORY — PX: RIGHT OOPHORECTOMY: SHX2359

## 2012-04-12 ENCOUNTER — Other Ambulatory Visit: Payer: Self-pay | Admitting: Internal Medicine

## 2012-04-12 NOTE — Telephone Encounter (Signed)
Med filled.  

## 2012-04-13 ENCOUNTER — Telehealth: Payer: Self-pay | Admitting: Internal Medicine

## 2012-04-13 ENCOUNTER — Encounter: Payer: Self-pay | Admitting: Adult Health

## 2012-04-13 ENCOUNTER — Ambulatory Visit (INDEPENDENT_AMBULATORY_CARE_PROVIDER_SITE_OTHER): Payer: Medicare Other | Admitting: Adult Health

## 2012-04-13 VITALS — BP 124/68 | HR 81 | Temp 97.9°F | Ht 64.0 in | Wt 187.0 lb

## 2012-04-13 DIAGNOSIS — M109 Gout, unspecified: Secondary | ICD-10-CM | POA: Diagnosis not present

## 2012-04-13 DIAGNOSIS — L089 Local infection of the skin and subcutaneous tissue, unspecified: Secondary | ICD-10-CM | POA: Diagnosis not present

## 2012-04-13 MED ORDER — MELOXICAM 7.5 MG PO TABS: 7.5000 mg | ORAL_TABLET | Freq: Every day | ORAL | Status: DC

## 2012-04-13 MED ORDER — PREDNISONE 10 MG PO TABS: 10.0000 mg | ORAL_TABLET | Freq: Every day | ORAL | Status: DC

## 2012-04-13 MED ORDER — PREDNISONE 10 MG PO TABS: ORAL_TABLET | ORAL | Status: DC

## 2012-04-13 NOTE — Progress Notes (Signed)
  Subjective:    Patient ID: Jamie Anderson, female    DOB: Oct 05, 1935, 77 y.o.   MRN: [id number]  HPI   Patient is a pleasant 77 y/o female who presents to clinic today with c/o pain L great toe. She reports eating seafood last night. She denies fever, chills. Pain has woken her during the night.   Current Outpatient Prescriptions on File Prior to Visit  Medication Sig Dispense Refill  . citalopram (CELEXA) 20 MG tablet Take 1 tablet (20 mg total) by mouth daily.  90 tablet  3  . clobetasol cream (TEMOVATE) 0.05 % Apply 1 application topically 2 (two) times daily.      Marland Kitchen estradiol (ESTRACE) 0.5 MG tablet Take 1 tablet (0.5 mg total) by mouth daily.  90 tablet  3  . fluticasone (FLONASE) 50 MCG/ACT nasal spray Place 2 sprays into the nose daily.  16 g  6  . HYDROcodone-acetaminophen (NORCO) 5-325 MG per tablet Take 1 tablet by mouth every 6 (six) hours as needed for pain.  30 tablet  0  . losartan-hydrochlorothiazide (HYZAAR) 100-25 MG per tablet Take 1 tablet by mouth daily.  90 tablet  3  . omeprazole (PRILOSEC) 20 MG capsule Take 20 mg by mouth daily.      . simvastatin (ZOCOR) 40 MG tablet TAKE 1 TABLET BY MOUTH AT BEDTIME.  90 tablet  0  . diazepam (VALIUM) 10 MG tablet Take 10 mg by mouth every 12 (twelve) hours as needed. Take 1/2 tablet every 12 hours as needed.          Review of Systems  Constitutional: Negative for fever and chills.  Respiratory: Negative.   Cardiovascular: Negative.   Musculoskeletal: Positive for joint swelling.       Pain L great toe; erythema  Neurological: Negative.   Psychiatric/Behavioral: Negative.     BP 124/68  Pulse 81  Temp 97.9 F (36.6 C)  Ht 5\' 4"  (1.626 m)  Wt 187 lb (84.823 kg)  BMI 32.10 kg/m2  SpO2 98%     Objective:   Physical Exam  Constitutional: She is oriented to person, place, and time. She appears well-developed and well-nourished.  Cardiovascular: Normal rate and regular rhythm.   Pulmonary/Chest: Effort normal.   Abdominal: Soft. Bowel sounds are normal.  Musculoskeletal: Normal range of motion. She exhibits edema and tenderness.       L great toe with erythema, inflammation and painful to touch.  Neurological: She is alert and oriented to person, place, and time.  Psychiatric: She has a normal mood and affect. Her behavior is normal. Judgment and thought content normal.          Assessment & Plan:

## 2012-04-13 NOTE — Patient Instructions (Addendum)
  Please have your labs drawn prior to leaving the office.  I am treating you for gout of your L great toe.  Start the prednisone taper. You will take 60 mg on the first day and then decrease by 10 mg daily until you are done.  Also, take meloxicam 7.5 mg daily for 10 - 14 days. Please take this with food.  Gout may be aggravated by red meats, seafood and alcohol consumption. Try to reduce the amount of these foods especially during an acute attack of gout.

## 2012-04-13 NOTE — Assessment & Plan Note (Signed)
Pain, inflammation and erythema of L great toe. Patient reports having seafood last evening. Suspect gout. Check uric acid levels and sed rate. Discussed with patient that only real way to diagnose is to aspirate from joint. We are treating empirically with prednisone taper and meloxicam x 10-14 days. She can also take Norco, which she has at home, for pain.

## 2012-04-13 NOTE — Telephone Encounter (Signed)
Just got this pt placed on your schedule this pm.

## 2012-04-13 NOTE — Telephone Encounter (Signed)
Patient Information:  Caller Name: Abi  Phone: [phone]  Patient: Jamie Anderson, Jamie Anderson  Gender: Female  DOB: Oct 31, 1935  Age: 77 Years  PCP: Duncan Dull (Adults only)  Office Follow Up:  Does the office need to follow up with this patient?: No  Instructions For The Office: N/A   Symptoms  Reason For Call & Symptoms: Left knee swelling and discomfort, seen in office Wed 1/29, given hydrocodone for pain.  Now pain in entire Left leg when got up during the night last night 2/5, big toe and part of foot swollen, red, hot, burning sensation, painful to walk on foot so limping.  Reviewed Health History In EMR: Yes  Reviewed Medications In EMR: Yes  Reviewed Allergies In EMR: Yes  Reviewed Surgeries / Procedures: Yes  Date of Onset of Symptoms: 04/12/2012  Treatments Tried: Took Hydrocodone that was prescribed for knee and has gotten some relief  Treatments Tried Worked: Yes  Guideline(s) Used:  Leg Pain  Toe Pain  Disposition Per Guideline:   See Today in Office  Reason For Disposition Reached:   Looks like a boil, infected sore, deep ulcer, or other infected rash (spreading redness, pus)  Advice Given:  N/A  Appointment Scheduled:  04/13/2012 15:15:00 Appointment Scheduled Provider:  Orville Govern

## 2012-04-18 ENCOUNTER — Other Ambulatory Visit: Payer: Self-pay | Admitting: General Practice

## 2012-04-18 MED ORDER — LOSARTAN POTASSIUM-HCTZ 100-25 MG PO TABS: 1.0000 | ORAL_TABLET | Freq: Every day | ORAL | Status: DC

## 2012-04-25 ENCOUNTER — Telehealth: Payer: Self-pay | Admitting: Internal Medicine

## 2012-04-25 NOTE — Telephone Encounter (Signed)
Post menopausal bleeding is never normal but not an emergency.  She needs either appt with me for pelvic exam.

## 2012-04-25 NOTE — Telephone Encounter (Signed)
Patient Information:  Caller Name: Krisanne  Phone: [phone]  Patient: Jamie Anderson, Jamie Anderson  Gender: Female  DOB: Jul 08, 1935  Age: 77 Years  PCP: Duncan Dull (Adults only)  Office Follow Up:  Does the office need to follow up with this patient?: No  Instructions For The Office: N/A  RN Note:  RN triaged  and reached See in 2 weeks Disposition  for continous hormone replacement therapy for more than 4 months and mild vaginal bleeding per Vaginal Bleeding  on Menopausal Hormone therapy protocol per [facility]. Pt takes Estradiol daily.    Pt stating her husband has appt  near the office 04/27/2012  and would like to make appt for that day if possible. RN advised to call back if heavy vaginal bleeding , or unbearable abdomen pain occurs and transferred to [facility] at office for appt scheduling.  Symptoms  Reason For Call & Symptoms: Today, 04/25/2012 , pt calling stating on she had onset  bright vaginal bleeding that lasted  4 days and then stopped. Today the vaginal bleeding started back this afternoon . She noticed  blood in underwear and with wiping after urinating.  Reviewed Health History In EMR: Yes  Reviewed Medications In EMR: Yes  Reviewed Allergies In EMR: Yes  Reviewed Surgeries / Procedures: Yes  Date of Onset of Symptoms: 04/19/2012  Guideline(s) Used:  No Protocol Available - Sick Adult  Disposition Per Guideline:   See Within 2 Weeks in Office  Reason For Disposition Reached:   Nursing judgment  Advice Given:  N/A

## 2012-04-26 ENCOUNTER — Ambulatory Visit (INDEPENDENT_AMBULATORY_CARE_PROVIDER_SITE_OTHER): Payer: Medicare Other | Admitting: Internal Medicine

## 2012-04-26 ENCOUNTER — Encounter: Payer: Self-pay | Admitting: Internal Medicine

## 2012-04-26 ENCOUNTER — Other Ambulatory Visit (HOSPITAL_COMMUNITY)
Admission: RE | Admit: 2012-04-26 | Discharge: 2012-04-26 | Disposition: A | Discharge status: Home / Self Care | Payer: Medicare Other | Source: Ambulatory Visit | Attending: Internal Medicine | Admitting: Internal Medicine

## 2012-04-26 VITALS — BP 124/64 | HR 97 | Temp 98.0°F | Resp 16 | Wt 188.2 lb

## 2012-04-26 DIAGNOSIS — Z124 Encounter for screening for malignant neoplasm of cervix: Secondary | ICD-10-CM | POA: Insufficient documentation

## 2012-04-26 DIAGNOSIS — Z1151 Encounter for screening for human papillomavirus (HPV): Secondary | ICD-10-CM | POA: Diagnosis not present

## 2012-04-26 DIAGNOSIS — N950 Postmenopausal bleeding: Secondary | ICD-10-CM

## 2012-04-26 NOTE — Progress Notes (Signed)
Patient ID: Jamie Anderson, female   DOB: [birth year], 77 y.o.   MRN: [id number]   Patient Active Problem List  Diagnosis  . Other and unspecified hyperlipidemia  . Hypertension  . Depression  . Pain of right scapula  . Cervicalgia  . Hip pain, right  . Routine general medical examination at a health care facility  . Knee pain  . Gout  . Postmenopausal vaginal bleeding    Subjective:  CC:   Chief Complaint  Patient presents with  . Vaginal Bleeding    HPI:   Jamie Anderson a 77 y.o. female who presents Post menopausal vaginal bleeding.  Symptoms started last week, with no history of vaginal discharge, pain, or recent sexual activity. She as been on HRT for years but her Prempro was inadvertently changed to generic estradiol in September due to insurance noncoverage for Prempro and chart inaccurately representing her surgical history as status post hysterectomy.     Past Medical History  Diagnosis Date  . Hyperlipidemia   . Hypertension   . Menopause   . Granuloma annulare     Past Surgical History  Procedure Laterality Date  . Spine surgery  2010  . Appendectomy    . Venous ablation      bilateral  . Dilation and curettage of uterus         The following portions of the patient's history were reviewed and updated as appropriate: Allergies, current medications, and problem list.    Review of Systems:   Patient denies headache, fevers, malaise, unintentional weight loss, skin rash, eye pain, sinus congestion and sinus pain, sore throat, dysphagia,  hemoptysis , cough, dyspnea, wheezing, chest pain, palpitations, orthopnea, edema, abdominal pain, nausea, melena, diarrhea, constipation, flank pain, dysuria, hematuria, urinary  Frequency, nocturia, numbness, tingling, seizures,  Focal weakness, Loss of consciousness,  Tremor, insomnia, depression, anxiety, and suicidal ideation.        History   Social History  . Marital Status: Married    Spouse Name: N/A    Number of Children: N/A  . Years of Education: N/A   Occupational History  . Not on file.   Social History Main Topics  . Smoking status: Never Smoker   . Smokeless tobacco: Not on file  . Alcohol Use: 2.4 oz/week    4 Glasses of wine per week  . Drug Use: Not on file  . Sexually Active: Not on file   Other Topics Concern  . Not on file   Social History Narrative  . No narrative on file    Objective:  BP 124/64  Pulse 97  Temp(Src) 98 F (36.7 C) (Oral)  Resp 16  Wt 188 lb 4 oz (85.39 kg)  BMI 32.3 kg/m2  SpO2 96%  General appearance: alert, cooperative and appears stated age Neck: no adenopathy, no carotid bruit, supple, symmetrical, trachea midline and thyroid not enlarged, symmetric, no tenderness/mass/nodules Lungs: clear to auscultation bilaterally Heart: regular rate and rhythm, S1, S2 normal, no murmur, click, rub or gallop Abdomen: soft, non-tender; bowel sounds normal; no masses,  no organomegaly Skin: Skin color, texture, turgor normal. No rashes or lesions Lymph nodes: Cervical, supraclavicular, and axillary nodes normal. GYN: Pelvic: cervix normal in appearance, external genitalia normal, no adnexal masses or tenderness, no cervical motion tenderness, positive findings: blood in vault, mucosa pale, rectovaginal septum normal, uterus normal size, shape, and consistency and vagina normal without discharge  Assessment and Plan:  Postmenopausal vaginal bleeding [place] due to endometrial hyperplasia from use of  unopposed oral estrogen since her insurance forced a medication change in September to her  HRT .  She has [employer] and used premarin vaginal cream for years for persistewnt hot flashes and vaginal atrophy.  Her surgical history was inaccurate; she has not had a hysterectomy, only a D & C in the past. Referral to GYN for endometrial biopsy in process.     Updated Medication List Outpatient Encounter Prescriptions as of 04/26/2012  Medication Sig  Dispense Refill  . citalopram (CELEXA) 20 MG tablet Take 1 tablet (20 mg total) by mouth daily.  90 tablet  3  . clobetasol cream (TEMOVATE) 0.05 % Apply 1 application topically 2 (two) times daily.      . diazepam (VALIUM) 10 MG tablet Take 10 mg by mouth every 12 (twelve) hours as needed. Take 1/2 tablet every 12 hours as needed.      . fluticasone (FLONASE) 50 MCG/ACT nasal spray Place 2 sprays into the nose daily.  16 g  6  . HYDROcodone-acetaminophen (NORCO) 5-325 MG per tablet Take 1 tablet by mouth every 6 (six) hours as needed for pain.  30 tablet  0  . losartan-hydrochlorothiazide (HYZAAR) 100-25 MG per tablet Take 1 tablet by mouth daily.  90 tablet  3  . meloxicam (MOBIC) 7.5 MG tablet Take 1 tablet (7.5 mg total) by mouth daily.  30 tablet  0  . omeprazole (PRILOSEC) 20 MG capsule Take 20 mg by mouth daily.      . simvastatin (ZOCOR) 40 MG tablet TAKE 1 TABLET BY MOUTH AT BEDTIME.  90 tablet  0  . [DISCONTINUED] estradiol (ESTRACE) 0.5 MG tablet Take 1 tablet (0.5 mg total) by mouth daily.  90 tablet  3  . [DISCONTINUED] predniSONE (DELTASONE) 10 MG tablet Take 60 mg on the first day and then decrease by 10 mg daily until done.  21 tablet  0   No facility-administered encounter medications on file as of 04/26/2012.     Orders Placed This Encounter  Procedures  . HM MAMMOGRAPHY  . CBC with Differential  . Protime-INR  . PTT  . HM PAP SMEAR  . Ambulatory referral to Gynecology    No Follow-up on file.

## 2012-04-26 NOTE — Telephone Encounter (Signed)
Pt has an appt this afternoon. 

## 2012-04-29 ENCOUNTER — Encounter: Payer: Self-pay | Admitting: Internal Medicine

## 2012-04-29 NOTE — Assessment & Plan Note (Signed)
Jamie Anderson due to endometrial hyperplasia from use of unopposed oral estrogen since her insurance forced a medication change in September to her  HRT .  She has [employer] and used premarin vaginal cream for years for persistewnt hot flashes and vaginal atrophy.  Her surgical history was inaccurate; she has not had a hysterectomy, only a D & C in the past. Referral to GYN for endometrial biopsy in process.

## 2012-05-05 ENCOUNTER — Other Ambulatory Visit: Payer: Self-pay | Admitting: *Deleted

## 2012-05-05 LAB — HM PAP SMEAR: HM Pap smear: NEGATIVE

## 2012-05-05 MED ORDER — MELOXICAM 7.5 MG PO TABS: 7.5000 mg | ORAL_TABLET | Freq: Every day | ORAL | Status: DC

## 2012-05-05 NOTE — Telephone Encounter (Addendum)
Received request from mail order pharmacy for meloxicam. Is this ok to refill.?

## 2012-05-05 NOTE — Telephone Encounter (Signed)
Yes, it's ok.

## 2012-05-05 NOTE — Telephone Encounter (Signed)
Last filled 04/13/12 for #30 at local pharmacy.

## 2012-05-08 DIAGNOSIS — L570 Actinic keratosis: Secondary | ICD-10-CM | POA: Diagnosis not present

## 2012-05-10 ENCOUNTER — Ambulatory Visit: Payer: Self-pay | Admitting: Ophthalmology

## 2012-05-29 ENCOUNTER — Ambulatory Visit: Payer: Self-pay | Admitting: Obstetrics & Gynecology

## 2012-05-29 DIAGNOSIS — N83209 Unspecified ovarian cyst, unspecified side: Secondary | ICD-10-CM | POA: Diagnosis not present

## 2012-05-29 DIAGNOSIS — I10 Essential (primary) hypertension: Secondary | ICD-10-CM | POA: Diagnosis not present

## 2012-05-29 DIAGNOSIS — N950 Postmenopausal bleeding: Secondary | ICD-10-CM | POA: Diagnosis not present

## 2012-05-29 DIAGNOSIS — Z01818 Encounter for other preprocedural examination: Secondary | ICD-10-CM | POA: Diagnosis not present

## 2012-05-29 DIAGNOSIS — Z01812 Encounter for preprocedural laboratory examination: Secondary | ICD-10-CM | POA: Diagnosis not present

## 2012-05-29 DIAGNOSIS — Z01810 Encounter for preprocedural cardiovascular examination: Secondary | ICD-10-CM | POA: Diagnosis not present

## 2012-05-29 DIAGNOSIS — I119 Hypertensive heart disease without heart failure: Secondary | ICD-10-CM | POA: Diagnosis not present

## 2012-05-29 LAB — BASIC METABOLIC PANEL
Anion Gap: 5 — ABNORMAL LOW (ref 7–16)
Creatinine: 0.95 mg/dL (ref 0.60–1.30)
Osmolality: 280 (ref 275–301)
Potassium: 3.8 mmol/L (ref 3.5–5.1)

## 2012-05-29 LAB — PROTIME-INR: INR: 0.9

## 2012-05-29 LAB — CBC
HCT: 38.2 % (ref 35.0–47.0)
HGB: 13.1 g/dL (ref 12.0–16.0)
Platelet: 198 10*3/uL (ref 150–440)
RDW: 14.9 % — ABNORMAL HIGH (ref 11.5–14.5)

## 2012-05-29 LAB — APTT: Activated PTT: 30.5 secs (ref 23.6–35.9)

## 2012-06-06 ENCOUNTER — Ambulatory Visit: Payer: Self-pay | Admitting: Obstetrics & Gynecology

## 2012-06-06 DIAGNOSIS — N950 Postmenopausal bleeding: Secondary | ICD-10-CM | POA: Diagnosis not present

## 2012-06-06 DIAGNOSIS — R1031 Right lower quadrant pain: Secondary | ICD-10-CM | POA: Diagnosis not present

## 2012-06-06 DIAGNOSIS — D279 Benign neoplasm of unspecified ovary: Secondary | ICD-10-CM | POA: Diagnosis not present

## 2012-06-06 DIAGNOSIS — Z79899 Other long term (current) drug therapy: Secondary | ICD-10-CM | POA: Diagnosis not present

## 2012-06-06 DIAGNOSIS — J449 Chronic obstructive pulmonary disease, unspecified: Secondary | ICD-10-CM | POA: Diagnosis not present

## 2012-06-06 DIAGNOSIS — I10 Essential (primary) hypertension: Secondary | ICD-10-CM | POA: Diagnosis not present

## 2012-06-06 DIAGNOSIS — M129 Arthropathy, unspecified: Secondary | ICD-10-CM | POA: Diagnosis not present

## 2012-06-06 DIAGNOSIS — N838 Other noninflammatory disorders of ovary, fallopian tube and broad ligament: Secondary | ICD-10-CM | POA: Diagnosis not present

## 2012-06-06 DIAGNOSIS — K219 Gastro-esophageal reflux disease without esophagitis: Secondary | ICD-10-CM | POA: Diagnosis not present

## 2012-06-06 DIAGNOSIS — N920 Excessive and frequent menstruation with regular cycle: Secondary | ICD-10-CM | POA: Diagnosis not present

## 2012-06-06 DIAGNOSIS — E785 Hyperlipidemia, unspecified: Secondary | ICD-10-CM | POA: Diagnosis not present

## 2012-06-08 LAB — PATHOLOGY REPORT

## 2012-06-14 ENCOUNTER — Telehealth: Payer: Self-pay | Admitting: Internal Medicine

## 2012-06-14 MED ORDER — NYSTATIN 100000 UNIT/ML MT SUSP: 500000.0000 [IU] | Freq: Four times a day (QID) | OROMUCOSAL | Status: DC

## 2012-06-14 NOTE — Telephone Encounter (Signed)
I will treat with an oral medication called nystatin liquid suspension to [place] to use four times daily for one week , if it does not resolve she will need to be seen

## 2012-06-14 NOTE — Telephone Encounter (Signed)
Patient Information:  Caller Name: Baya  Phone: [phone]  Patient: Jamie Anderson, Jamie Anderson  Gender: Female  DOB: 24-Jul-1935  Age: 77 Years  PCP: Duncan Dull (Adults only)  Office Follow Up:  Does the office need to follow up with this patient?: Yes  Instructions For The Office: Please call to advise if MD will treat for possible thrush without evaluation.  RN Note:  Sore/raw mouth. Tongue and gum irritated; raw under denture. Asking for Diflucan per RPh recommendation for "thrush" he feels may have resulted from use of Prednisone eye drops.  Does not see any white patches in mouth.  Two recent surgeries.    Advised to see MD within 72 hours for burning sensation in mouth without any signs of lesions per Mouth Lesions guideline. Requests MD order RX without visit. Agreed to be seen if MD will not treat without evaluation. [facility] [phone].  Please call back.  Symptoms  Reason For Call & Symptoms: Called for Rx for thrush.  Symptoms present after eye surgery 05/10/12 and use of Predisilone drops for 10 days.  Then had 2nd surgery,oophrectomy and D&C on 06/06/12. Reports sore/raw mouth, tongue very irritated.  Raw under denture.  No white patches seen.  Reviewed Health History In EMR: Yes  Reviewed Medications In EMR: Yes  Reviewed Allergies In EMR: Yes  Reviewed Surgeries / Procedures: Yes  Date of Onset of Symptoms: 05/24/2012  Treatments Tried: salt water rinse  Treatments Tried Worked: No  Guideline(s) Used:  No Protocol Available - Information Only  Disposition Per Guideline:   Discuss with PCP and Callback by Nurse Today  Reason For Disposition Reached:   Nursing judgment  Advice Given:  N/A  Patient Will Follow Care Advice:  YES

## 2012-06-14 NOTE — Telephone Encounter (Signed)
Patient notified

## 2012-07-10 DIAGNOSIS — R1031 Right lower quadrant pain: Secondary | ICD-10-CM | POA: Diagnosis not present

## 2012-07-20 ENCOUNTER — Other Ambulatory Visit: Payer: Self-pay | Admitting: *Deleted

## 2012-07-20 MED ORDER — SIMVASTATIN 40 MG PO TABS: ORAL_TABLET | ORAL | Status: DC

## 2012-07-26 ENCOUNTER — Telehealth: Payer: Self-pay | Admitting: Internal Medicine

## 2012-07-26 NOTE — Telephone Encounter (Signed)
citalopram (CELEXA) 20 MG tablet #90

## 2012-07-27 MED ORDER — CITALOPRAM HYDROBROMIDE 20 MG PO TABS: 20.0000 mg | ORAL_TABLET | Freq: Every day | ORAL | Status: DC

## 2012-07-27 NOTE — Telephone Encounter (Signed)
Ok to refill,  Authorized in epic and sent  

## 2012-07-27 NOTE — Telephone Encounter (Signed)
Last OV 2/14 please advise as to refill.

## 2012-09-02 ENCOUNTER — Other Ambulatory Visit: Payer: Self-pay | Admitting: Internal Medicine

## 2012-10-07 DIAGNOSIS — Q359 Cleft palate, unspecified: Secondary | ICD-10-CM | POA: Diagnosis not present

## 2012-10-30 ENCOUNTER — Telehealth: Payer: Self-pay | Admitting: Internal Medicine

## 2012-10-30 NOTE — Telephone Encounter (Signed)
Patient Information:  Caller Name: Jamie Anderson  Phone: [phone]  Patient: Jamie Anderson, Jamie Anderson  Gender: Female  DOB: 1936/02/02  Age: 77 Years  PCP: Duncan Dull (Adults only)  Office Follow Up:  Does the office need to follow up with this patient?: No  Instructions For The Office: N/A  RN Note:  Pt has severe Headache w/ Body aches and chills. Pt concerned, would like to know if it's the flu.  Appt scheduled.  Symptoms  Reason For Call & Symptoms: Headache and Body aches w/ Chills  Reviewed Health History In EMR: Yes  Reviewed Medications In EMR: Yes  Reviewed Allergies In EMR: Yes  Reviewed Surgeries / Procedures: Yes  Date of Onset of Symptoms: 10/29/2012  Treatments Tried: Ibuprofen 400mg  taken before bed  Treatments Tried Worked: No  Guideline(s) Used:  Influenza - Seasonal  Disposition Per Guideline:   See Today or Tomorrow in Office  Reason For Disposition Reached:   Patient wants to be seen  Advice Given:  N/A  Patient Will Follow Care Advice:  YES  Appointment Scheduled:  10/31/2012 09:30:00 Appointment Scheduled Provider:  Orville Govern

## 2012-10-30 NOTE — Telephone Encounter (Signed)
FYI appointment scheduled with Raquel Rey NP 10/31/12

## 2012-10-31 ENCOUNTER — Encounter: Payer: Self-pay | Admitting: *Deleted

## 2012-10-31 ENCOUNTER — Ambulatory Visit: Payer: Self-pay | Admitting: Adult Health

## 2012-11-09 ENCOUNTER — Telehealth: Payer: Self-pay | Admitting: *Deleted

## 2012-11-09 DIAGNOSIS — Z79899 Other long term (current) drug therapy: Secondary | ICD-10-CM

## 2012-11-09 DIAGNOSIS — E785 Hyperlipidemia, unspecified: Secondary | ICD-10-CM

## 2012-11-09 NOTE — Telephone Encounter (Signed)
Patient called wanting refill on simvastatin patient was advised in MAY that office visit needed for further refills, patient scheduled 11/21/12 after missed appointment on 10/31/12 patient will be in before medication runs out. Appointment was scheduled today. Patient was told to come fasting in case blood work needed. FYI

## 2012-11-10 NOTE — Telephone Encounter (Signed)
Labs ordered,  If she Makes fasting lab appt prior to ov,  Ok to refill zocor for 30 days

## 2012-11-14 DIAGNOSIS — L299 Pruritus, unspecified: Secondary | ICD-10-CM | POA: Diagnosis not present

## 2012-11-14 DIAGNOSIS — R209 Unspecified disturbances of skin sensation: Secondary | ICD-10-CM | POA: Diagnosis not present

## 2012-11-14 DIAGNOSIS — L538 Other specified erythematous conditions: Secondary | ICD-10-CM | POA: Diagnosis not present

## 2012-11-16 DIAGNOSIS — L299 Pruritus, unspecified: Secondary | ICD-10-CM | POA: Diagnosis not present

## 2012-11-20 DIAGNOSIS — L299 Pruritus, unspecified: Secondary | ICD-10-CM | POA: Diagnosis not present

## 2012-11-21 NOTE — Telephone Encounter (Signed)
Patient did not keep appointment.

## 2012-11-22 ENCOUNTER — Ambulatory Visit (INDEPENDENT_AMBULATORY_CARE_PROVIDER_SITE_OTHER): Payer: Medicare Other | Admitting: Internal Medicine

## 2012-11-22 ENCOUNTER — Encounter: Payer: Self-pay | Admitting: Internal Medicine

## 2012-11-22 VITALS — BP 138/72 | HR 77 | Temp 97.6°F | Resp 14 | Ht 64.0 in | Wt 184.2 lb

## 2012-11-22 DIAGNOSIS — E785 Hyperlipidemia, unspecified: Secondary | ICD-10-CM

## 2012-11-22 DIAGNOSIS — F32A Depression, unspecified: Secondary | ICD-10-CM

## 2012-11-22 DIAGNOSIS — M109 Gout, unspecified: Secondary | ICD-10-CM | POA: Diagnosis not present

## 2012-11-22 DIAGNOSIS — Z79899 Other long term (current) drug therapy: Secondary | ICD-10-CM

## 2012-11-22 DIAGNOSIS — I10 Essential (primary) hypertension: Secondary | ICD-10-CM

## 2012-11-22 DIAGNOSIS — F09 Unspecified mental disorder due to known physiological condition: Secondary | ICD-10-CM

## 2012-11-22 DIAGNOSIS — F3289 Other specified depressive episodes: Secondary | ICD-10-CM

## 2012-11-22 DIAGNOSIS — L299 Pruritus, unspecified: Secondary | ICD-10-CM | POA: Diagnosis not present

## 2012-11-22 DIAGNOSIS — F329 Major depressive disorder, single episode, unspecified: Secondary | ICD-10-CM

## 2012-11-22 DIAGNOSIS — Z23 Encounter for immunization: Secondary | ICD-10-CM

## 2012-11-22 DIAGNOSIS — N9489 Other specified conditions associated with female genital organs and menstrual cycle: Secondary | ICD-10-CM

## 2012-11-22 DIAGNOSIS — R413 Other amnesia: Secondary | ICD-10-CM

## 2012-11-22 LAB — COMPREHENSIVE METABOLIC PANEL
ALT: 14 U/L (ref 0–35)
Albumin: 4 g/dL (ref 3.5–5.2)
CO2: 29 mEq/L (ref 19–32)
Calcium: 9.8 mg/dL (ref 8.4–10.5)
Chloride: 101 mEq/L (ref 96–112)
GFR: 57.44 mL/min — ABNORMAL LOW (ref >60.00)
Sodium: 139 mEq/L (ref 135–145)
Total Protein: 7 g/dL (ref 6.0–8.3)

## 2012-11-22 MED ORDER — ZOSTER VACCINE LIVE 19400 UNT/0.65ML ~~LOC~~ SOLR: 0.6500 mL | Freq: Once | SUBCUTANEOUS | Status: DC

## 2012-11-22 MED ORDER — TETANUS-DIPHTH-ACELL PERTUSSIS 5-2.5-18.5 LF-MCG/0.5 IM SUSP: 0.5000 mL | Freq: Once | INTRAMUSCULAR | Status: DC

## 2012-11-22 NOTE — Progress Notes (Signed)
Patient ID: Jamie Anderson, female   DOB: [birth year], 77 y.o.   MRN: [id number]   Patient Active Problem List   Diagnosis Date Noted  . Postmenopausal vaginal bleeding 04/29/2012  . Gout 04/13/2012  . Knee pain 04/03/2012  . Routine general medical examination at a health care facility 11/26/2011  . Hip pain, right 11/25/2011  . Cervicalgia 07/26/2011  . Other and unspecified hyperlipidemia 05/25/2011  . Hypertension 05/25/2011  . Depression 05/25/2011    Subjective:  CC:   Chief Complaint  Patient presents with  . Follow-up    medication refills,  patient is fasting    HPI:   Jamie Anderson a 77 y.o. female who presents for followup on chronic medical issues. She underwent a right oophorectomy in March after she was referred to Dr. Tiburcio Pea for postmenopausal bleeding while on hormone therapy.  Her left ovary was left intact for unclear reasons.she is no longer taking hormone therapy and has not had any bleeding.   She continues to have recurrent episodes of gout affecting the right great toe. Her last episode was this weekend and she was treated in the walk-in clinic with Vicodin. Her pain has improved.   she feels she is developing memory loss for short-term memory. She often forgets her she is going when she is driving.     Past Medical History  Diagnosis Date  . Hyperlipidemia   . Hypertension   . Menopause   . Granuloma annulare     Past Surgical History  Procedure Laterality Date  . Spine surgery  2010  . Appendectomy    . Venous ablation      bilateral  . Dilation and curettage of uterus    . Right oophorectomy Right feb 2014    [name]       The following portions of the patient's history were reviewed and updated as appropriate: Allergies, current medications, and problem list.    Review of Systems:   12 Pt  review of systems was negative except those addressed in the HPI,     History   Social History  . Marital Status: Married    Spouse  Name: N/A    Number of Children: N/A  . Years of Education: N/A   Occupational History  . Not on file.   Social History Main Topics  . Smoking status: Never Smoker   . Smokeless tobacco: Not on file  . Alcohol Use: 2.4 oz/week    4 Glasses of wine per week  . Drug Use: Not on file  . Sexual Activity: Not on file   Other Topics Concern  . Not on file   Social History Narrative  . No narrative on file    Objective:  Filed Vitals:   11/22/12 0815  BP: 138/72  Pulse: 77  Temp: 97.6 F (36.4 C)  Resp: 14     General appearance: alert, cooperative and appears stated age Ears: normal TM's and external ear canals both ears Throat: lips, mucosa, and tongue normal; teeth and gums normal Neck: no adenopathy, no carotid bruit, supple, symmetrical, trachea midline and thyroid not enlarged, symmetric, no tenderness/mass/nodules Back: symmetric, no curvature. ROM normal. No CVA tenderness. Lungs: clear to auscultation bilaterally Heart: regular rate and rhythm, S1, S2 normal, no murmur, click, rub or gallop Abdomen: soft, non-tender; bowel sounds normal; no masses,  no organomegaly Pulses: 2+ and symmetric Skin: Skin color, texture, turgor normal. No rashes or lesions. Lymph nodes: Cervical, supraclavicular, and axillary nodes normal. MSK: WNL  except for right great toe slightly swollen and red   Assessment and Plan:  Gout Recurrent with uric acid level elevated at 8.0. Starting allopurinol 100 mg daily. Advised to take meloxicam 15 mg daily simultaneously to avoid gout flare.  Other and unspecified hyperlipidemia LDL and triglycerides are at goal on current medications. sHe has no side effects and liver enzymes are normal. No changes today   Hypertension Changing losartan HCTZ to losartan to reduce chances of recurrent gout flares.  Depression Well controlled on current regimen. no changes today.  Memory loss, short term screening for were stable causes was done today  with B12 RPR and TSH. All normal. She will return for her annual wellness exam and a full Mini-Mental Status will be done  A total of 40 minutes was spent with patient more than half of which was spent in counseling, reviewing records on [place] and coordination of care.  Updated Medication List Outpatient Encounter Prescriptions as of 11/22/2012  Medication Sig Dispense Refill  . citalopram (CELEXA) 20 MG tablet Take 1 tablet (20 mg total) by mouth daily.  90 tablet  3  . clobetasol cream (TEMOVATE) 0.05 % Apply 1 application topically 2 (two) times daily.      Marland Kitchen esomeprazole (NEXIUM) 20 MG capsule Take 20 mg by mouth daily before breakfast.      . fluticasone (FLONASE) 50 MCG/ACT nasal spray USE 2 SPRAYS IN EACH NOSTRIL EVERY DAY  48 g  6  . HYDROcodone-acetaminophen (NORCO) 5-325 MG per tablet Take 1 tablet by mouth every 6 (six) hours as needed for pain.  30 tablet  0  . [DISCONTINUED] losartan-hydrochlorothiazide (HYZAAR) 100-25 MG per tablet Take 1 tablet by mouth daily.  90 tablet  3  . allopurinol (ZYLOPRIM) 100 MG tablet Take 1 tablet (100 mg total) by mouth daily.  30 tablet  5  . diazepam (VALIUM) 10 MG tablet Take 10 mg by mouth every 12 (twelve) hours as needed. Take 1/2 tablet every 12 hours as needed.      Marland Kitchen losartan (COZAAR) 100 MG tablet Take 1 tablet (100 mg total) by mouth daily. Keep on file for next refill of losartan (without hct)  90 tablet  3  . meloxicam (MOBIC) 15 MG tablet Take 1 tablet (15 mg total) by mouth daily.  30 tablet  2  . nystatin (MYCOSTATIN) 100000 UNIT/ML suspension Take 5 mLs (500,000 Units total) by mouth 4 (four) times daily.  100 mL  0  . omeprazole (PRILOSEC) 20 MG capsule Take 20 mg by mouth daily.      . simvastatin (ZOCOR) 40 MG tablet TAKE 1 TABLET BY MOUTH AT BEDTIME.  90 tablet  2  . TDaP (BOOSTRIX) 5-2.5-18.5 LF-MCG/0.5 injection Inject 0.5 mLs into the muscle once.  0.5 mL  0  . zoster vaccine live, PF, (ZOSTAVAX) 16109 UNT/0.65ML injection  Inject 19,400 Units into the skin once.  1 each  0  . [DISCONTINUED] meloxicam (MOBIC) 7.5 MG tablet Take 1 tablet (7.5 mg total) by mouth daily.  30 tablet  0  . [DISCONTINUED] simvastatin (ZOCOR) 40 MG tablet TAKE 1 TABLET BY MOUTH AT BEDTIME.  90 tablet  0   No facility-administered encounter medications on file as of 11/22/2012.

## 2012-11-22 NOTE — Patient Instructions (Addendum)
We are chanign your losartin hct to losartan only with your next refill  Will refill the simvastatin once I see your labs from today  If your uric acid level is high I will recommend starting a daily medication  Called allopurinol to lower it,   so your gout flares occur less often   PLEASE GO GET YOUR TDAP  VACCINE!

## 2012-11-23 ENCOUNTER — Encounter: Payer: Self-pay | Admitting: Internal Medicine

## 2012-11-23 DIAGNOSIS — D270 Benign neoplasm of right ovary: Secondary | ICD-10-CM | POA: Insufficient documentation

## 2012-11-23 DIAGNOSIS — R413 Other amnesia: Secondary | ICD-10-CM | POA: Insufficient documentation

## 2012-11-23 HISTORY — DX: Benign neoplasm of right ovary: D27.0

## 2012-11-23 LAB — RPR

## 2012-11-23 MED ORDER — MELOXICAM 15 MG PO TABS: 15.0000 mg | ORAL_TABLET | Freq: Every day | ORAL | Status: DC

## 2012-11-23 MED ORDER — ALLOPURINOL 100 MG PO TABS: 100.0000 mg | ORAL_TABLET | Freq: Every day | ORAL | Status: DC

## 2012-11-23 MED ORDER — SIMVASTATIN 40 MG PO TABS: ORAL_TABLET | ORAL | Status: DC

## 2012-11-23 MED ORDER — LOSARTAN POTASSIUM 100 MG PO TABS: 100.0000 mg | ORAL_TABLET | Freq: Every day | ORAL | Status: DC

## 2012-11-23 NOTE — Assessment & Plan Note (Signed)
LDL and triglycerides are at goal on current medications. sHe has no side effects and liver enzymes are normal. No changes today  

## 2012-11-23 NOTE — Assessment & Plan Note (Signed)
screening for were stable causes was done today with B12 RPR and TSH. All normal. She will return for her annual wellness exam and a full Mini-Mental Status will be done

## 2012-11-23 NOTE — Assessment & Plan Note (Signed)
Changing losartan HCTZ to losartan to reduce chances of recurrent gout flares.

## 2012-11-23 NOTE — Assessment & Plan Note (Signed)
Recurrent with uric acid level elevated at 8.0. Starting allopurinol 100 mg daily. Advised to take meloxicam 15 mg daily simultaneously to avoid gout flare.

## 2012-11-23 NOTE — Assessment & Plan Note (Signed)
Well controlled on current regimen.  no changes today.   

## 2012-11-24 ENCOUNTER — Telehealth: Payer: Self-pay | Admitting: Internal Medicine

## 2012-11-24 ENCOUNTER — Encounter: Payer: Self-pay | Admitting: *Deleted

## 2012-11-24 ENCOUNTER — Other Ambulatory Visit: Payer: Self-pay | Admitting: *Deleted

## 2012-11-24 DIAGNOSIS — E785 Hyperlipidemia, unspecified: Secondary | ICD-10-CM

## 2012-11-24 DIAGNOSIS — L299 Pruritus, unspecified: Secondary | ICD-10-CM | POA: Diagnosis not present

## 2012-11-24 MED ORDER — SIMVASTATIN 40 MG PO TABS: ORAL_TABLET | ORAL | Status: DC

## 2012-11-24 NOTE — Telephone Encounter (Signed)
Patient came to the office regarding simvastatin Rx.  States at appt on 9/17 she had several meds needing refills.  Pt states simvastatin was to be called to her mail order pharmacy but also to be called to [facility] in [place] for at least a few days supply because she was almost out.  Pt states she is completely out as of yesterday and that [place] does not have a prescription for her.  Pt waited in office for approximately 40 minutes before leaving.  Pt asking if this can be called to [facility] in [place] asap as she is out.

## 2012-11-24 NOTE — Telephone Encounter (Signed)
0 day supply sent to [facility],

## 2012-11-27 DIAGNOSIS — L299 Pruritus, unspecified: Secondary | ICD-10-CM | POA: Diagnosis not present

## 2012-11-30 DIAGNOSIS — L299 Pruritus, unspecified: Secondary | ICD-10-CM | POA: Diagnosis not present

## 2012-12-05 DIAGNOSIS — L299 Pruritus, unspecified: Secondary | ICD-10-CM | POA: Diagnosis not present

## 2012-12-07 DIAGNOSIS — L299 Pruritus, unspecified: Secondary | ICD-10-CM | POA: Diagnosis not present

## 2012-12-11 DIAGNOSIS — L299 Pruritus, unspecified: Secondary | ICD-10-CM | POA: Diagnosis not present

## 2012-12-13 DIAGNOSIS — L299 Pruritus, unspecified: Secondary | ICD-10-CM | POA: Diagnosis not present

## 2012-12-15 DIAGNOSIS — L299 Pruritus, unspecified: Secondary | ICD-10-CM | POA: Diagnosis not present

## 2012-12-20 DIAGNOSIS — L299 Pruritus, unspecified: Secondary | ICD-10-CM | POA: Diagnosis not present

## 2012-12-21 DIAGNOSIS — H2510 Age-related nuclear cataract, unspecified eye: Secondary | ICD-10-CM | POA: Diagnosis not present

## 2012-12-21 DIAGNOSIS — Z961 Presence of intraocular lens: Secondary | ICD-10-CM | POA: Diagnosis not present

## 2012-12-27 DIAGNOSIS — L299 Pruritus, unspecified: Secondary | ICD-10-CM | POA: Diagnosis not present

## 2012-12-29 ENCOUNTER — Encounter: Payer: Self-pay | Admitting: Internal Medicine

## 2012-12-29 ENCOUNTER — Ambulatory Visit (INDEPENDENT_AMBULATORY_CARE_PROVIDER_SITE_OTHER): Payer: Medicare Other | Admitting: Internal Medicine

## 2012-12-29 VITALS — BP 172/88 | HR 83 | Temp 98.0°F | Resp 14 | Ht 65.0 in | Wt 187.2 lb

## 2012-12-29 DIAGNOSIS — Z1239 Encounter for other screening for malignant neoplasm of breast: Secondary | ICD-10-CM | POA: Diagnosis not present

## 2012-12-29 DIAGNOSIS — I10 Essential (primary) hypertension: Secondary | ICD-10-CM

## 2012-12-29 DIAGNOSIS — K9041 Non-celiac gluten sensitivity: Secondary | ICD-10-CM | POA: Insufficient documentation

## 2012-12-29 DIAGNOSIS — N9489 Other specified conditions associated with female genital organs and menstrual cycle: Secondary | ICD-10-CM

## 2012-12-29 DIAGNOSIS — Z79899 Other long term (current) drug therapy: Secondary | ICD-10-CM | POA: Diagnosis not present

## 2012-12-29 DIAGNOSIS — M25551 Pain in right hip: Secondary | ICD-10-CM

## 2012-12-29 DIAGNOSIS — E669 Obesity, unspecified: Secondary | ICD-10-CM

## 2012-12-29 DIAGNOSIS — F429 Obsessive-compulsive disorder, unspecified: Secondary | ICD-10-CM | POA: Insufficient documentation

## 2012-12-29 DIAGNOSIS — F411 Generalized anxiety disorder: Secondary | ICD-10-CM

## 2012-12-29 DIAGNOSIS — L299 Pruritus, unspecified: Secondary | ICD-10-CM | POA: Diagnosis not present

## 2012-12-29 DIAGNOSIS — Z0000 Encounter for general adult medical examination without abnormal findings: Secondary | ICD-10-CM | POA: Diagnosis not present

## 2012-12-29 LAB — BASIC METABOLIC PANEL
BUN: 22 mg/dL (ref 6–23)
Chloride: 105 mEq/L (ref 96–112)
Glucose, Bld: 83 mg/dL (ref 70–99)
Potassium: 4.7 mEq/L (ref 3.5–5.1)

## 2012-12-29 NOTE — Progress Notes (Signed)
Patient ID: Jamie Anderson, female   DOB: 08-Mar-1936, 77 y.o.   MRN: [id number]  The patient is here for annual Medicare wellness examination and management of other chronic and acute problems.  Has noticed her memory is worse since she stopped HRT sand is having hot flashes  FH has become more strongly positive for heart disease l father, half sister and brother   The risk factors are reflected in the social history.  The roster of all physicians providing medical care to patient - is listed in the Snapshot section of the chart.  Activities of daily living:  The patient is 100% independent in all ADLs: dressing, toileting, feeding as well as independent mobility  Home safety : The patient has smoke detectors in the home. They wear seatbelts.  There are no firearms at home. There is no violence in the home.   There is no risks for hepatitis, STDs or HIV. There is no   history of blood transfusion. They have no travel history to infectious disease endemic areas of the world.  The patient has seen their dentist in the last six month. They have seen their eye doctor in the last year. They admit to slight hearing difficulty with regard to whispered voices and some television programs.  They have deferred audiologic testing in the last year.  They do not  have excessive sun exposure. Discussed the need for sun protection: hats, long sleeves and use of sunscreen if there is significant sun exposure.   Diet: the importance of a healthy diet is discussed. They do have a healthy diet.  The benefits of regular aerobic exercise were discussed. She walks 4 times per week ,  20 minutes.   Depression screen: there are no signs or vegative symptoms of depression- irritability, change in appetite, anhedonia, sadness/tearfullness.  Cognitive assessment: the patient manages all their financial and personal affairs and is actively engaged. They could relate day,date,year and events; recalled 2/3 objects at 3  minutes; performed clock-face test normally.  The following portions of the patient's history were reviewed and updated as appropriate: allergies, current medications, past family history, past medical history,  past surgical history, past social history  and problem list.  Visual acuity was not assessed per patient preference since she has regular follow up with her ophthalmologist. Hearing and body mass index were assessed and reviewed.   During the course of the visit the patient was educated and counseled about appropriate screening and preventive services including : fall prevention , diabetes screening, nutrition counseling, colorectal cancer screening, and recommended immunizations.    Objective:  BP 172/88  Pulse 83  Temp(Src) 98 F (36.7 C) (Oral)  Resp 14  Ht 5\' 5"  (1.651 m)  Wt 187 lb 4 oz (84.936 kg)  BMI 31.16 kg/m2  SpO2 98%  General Appearance:    Alert, cooperative, no distress, appears stated age  Head:    Normocephalic, without obvious abnormality, atraumatic  Eyes:    PERRL, conjunctiva/corneas clear, EOM's intact, fundi    benign, both eyes  Ears:    Normal TM's and external ear canals, both ears  Nose:   Nares normal, septum midline, mucosa normal, no drainage    or sinus tenderness  Throat:   Lips, mucosa, and tongue normal; teeth and gums normal  Neck:   Supple, symmetrical, trachea midline, no adenopathy;    thyroid:  no enlargement/tenderness/nodules; no carotid   bruit or JVD  Back:     Symmetric, no curvature, ROM normal,  no CVA tenderness  Lungs:     Clear to auscultation bilaterally, respirations unlabored  Chest Wall:    No tenderness or deformity   Heart:    Regular rate and rhythm, S1 and S2 normal, no murmur, rub   or gallop  Breast Exam:    No tenderness, masses, or nipple abnormality  Abdomen:     Soft, non-tender, bowel sounds active all four quadrants,    no masses, no organomegaly  Genitalia:    Normal female without lesion, discharge or  tenderness  Rectal:    Normal tone, normal prostate, no masses or tenderness;   guaiac negative stool  Extremities:   Extremities normal, atraumatic, no cyanosis or edema  Pulses:   2+ and symmetric all extremities  Skin:   Skin color, texture, turgor normal, no rashes or lesions  Lymph nodes:   Cervical, supraclavicular, and axillary nodes normal  Neurologic:   CNII-XII intact, normal strength, sensation and reflexes    throughout   Assessment and Plan:  Cystic teratoma of right ovary She underwent right salpingo-oophorectomy on April 1 by Dr. Tiburcio Pea for removal of what turned out to be a cystic teratoma. Hysteroscopy was also done at the time due to postmenopausal bleeding. It is unclear  why the left ovary was not taken out since she is postmenopausal. Records were requested after her last visit but have not been received and  the patient has been charged $30 by their office for the requested records.  I have drafted a letter to Dr. Tiburcio Pea requesting the information as a courtesy since I am her PCP   Anxiety state, unspecified After the 45 minute visit was complete and patient passed me in the hallway,  She stopped me to ask  why she "counts " all the time. I suggested it may be due to anxiety and have asked her to make another appt to discuss if her sympotms were severe or plan to discuss at next regularly scheduled meeting in 6 months     Gluten-sensitive enteropathy Not  Confirmed.  Symptoms are not consistent and are more suggestive of IBS  Hypertension Elevated today.  Reviewed list of meds, patient is not taking OTC meds that could be causing,. It.  Have asked patient to recheck bp at home a minimum of 5 times over the next 4 weeks and call readings to office for adjustment of medications.    Hip pain, right Managed with meloxicam, however her BP is elevated.  Will suspend NSAIDs and use tramadol if needed while BP issues are clarified.   Routine general medical examination at  a health care facility Annual comprehensive exam was done including breast without pelvic and PAP smear was done . All screenings have been addressed .   Obesity, unspecified I have addressed  BMI and recommended a low glycemic index diet utilizing smaller more frequent meals to increase metabolism.  I have also recommended that patient start exercising with a goal of 30 minutes of aerobic exercise a minimum of 5 days per week. Screening for lipid disorders, thyroid and diabetes to be done today.     Updated Medication List Outpatient Encounter Prescriptions as of 12/29/2012  Medication Sig Dispense Refill  . allopurinol (ZYLOPRIM) 100 MG tablet Take 1 tablet (100 mg total) by mouth daily.  30 tablet  5  . citalopram (CELEXA) 20 MG tablet Take 1 tablet (20 mg total) by mouth daily.  90 tablet  3  . fluticasone (FLONASE) 50 MCG/ACT nasal spray  USE 2 SPRAYS IN EACH NOSTRIL EVERY DAY  48 g  6  . simvastatin (ZOCOR) 40 MG tablet TAKE 1 TABLET BY MOUTH AT BEDTIME.  30 tablet  0  . [DISCONTINUED] meloxicam (MOBIC) 15 MG tablet Take 1 tablet (15 mg total) by mouth daily.  30 tablet  2  . clobetasol cream (TEMOVATE) 0.05 % Apply 1 application topically 2 (two) times daily.      . diazepam (VALIUM) 10 MG tablet Take 10 mg by mouth every 12 (twelve) hours as needed. Take 1/2 tablet every 12 hours as needed.      Marland Kitchen esomeprazole (NEXIUM) 20 MG capsule Take 20 mg by mouth daily before breakfast.      . omeprazole (PRILOSEC) 20 MG capsule Take 20 mg by mouth daily.      . TDaP (BOOSTRIX) 5-2.5-18.5 LF-MCG/0.5 injection Inject 0.5 mLs into the muscle once.  0.5 mL  0  . zoster vaccine live, PF, (ZOSTAVAX) 40981 UNT/0.65ML injection Inject 19,400 Units into the skin once.  1 each  0  . [DISCONTINUED] HYDROcodone-acetaminophen (NORCO) 5-325 MG per tablet Take 1 tablet by mouth every 6 (six) hours as needed for pain.  30 tablet  0  . [DISCONTINUED] losartan (COZAAR) 100 MG tablet Take 1 tablet (100 mg total)  by mouth daily. Keep on file for next refill of losartan (without hct)  90 tablet  3  . [DISCONTINUED] nystatin (MYCOSTATIN) 100000 UNIT/ML suspension Take 5 mLs (500,000 Units total) by mouth 4 (four) times daily.  100 mL  0   No facility-administered encounter medications on file as of 12/29/2012.

## 2012-12-29 NOTE — Patient Instructions (Signed)
You had your annual Medicare wellness exam today  We will schedule your mammogram soon.  Please use the stool kit to send [place] back a sample to test for blood.  This is your colon CA screening test.   You need to have a TDaP vaccine.   I have given you prescriptions for these because they will be cheaper at the health Dept or at your  local pharmacy because Medicare will not reimburse for them.     We will contact you with the bloodwork results

## 2012-12-31 ENCOUNTER — Encounter: Payer: Self-pay | Admitting: Internal Medicine

## 2012-12-31 DIAGNOSIS — E669 Obesity, unspecified: Secondary | ICD-10-CM | POA: Insufficient documentation

## 2012-12-31 NOTE — Assessment & Plan Note (Signed)
I have addressed  BMI and recommended a low glycemic index diet utilizing smaller more frequent meals to increase metabolism.  I have also recommended that patient start exercising with a goal of 30 minutes of aerobic exercise a minimum of 5 days per week. Screening for lipid disorders, thyroid and diabetes to be done today.   

## 2012-12-31 NOTE — Assessment & Plan Note (Signed)
After the 45 minute visit was complete and patient passed me in the hallway,  She stopped me to ask  why she "counts " all the time. I suggested it may be due to anxiety and have asked her to make another appt to discuss if her sympotms were severe or plan to discuss at next regularly scheduled meeting in 6 months

## 2012-12-31 NOTE — Assessment & Plan Note (Signed)
Elevated today.  Reviewed list of meds, patient is not taking OTC meds that could be causing,. It.  Have asked patient to recheck bp at home a minimum of 5 times over the next 4 weeks and call readings to office for adjustment of medications.   

## 2012-12-31 NOTE — Assessment & Plan Note (Signed)
Not  Confirmed.  Symptoms are not consistent and are more suggestive of IBS

## 2012-12-31 NOTE — Assessment & Plan Note (Addendum)
She underwent right salpingo-oophorectomy on April 1 by Dr. Tiburcio Pea for removal of what turned out to be a cystic teratoma. Hysteroscopy was also done at the time due to postmenopausal bleeding. It is unclear  why the left ovary was not taken out since she is postmenopausal. Records were requested after her last visit but have not been received and  the patient has been charged $30 by their office for the requested records.  I have drafted a letter to Dr. Tiburcio Pea requesting the information as a courtesy since I am her PCP

## 2012-12-31 NOTE — Assessment & Plan Note (Signed)
Managed with meloxicam, however her BP is elevated.  Will suspend NSAIDs and use tramadol if needed while BP issues are clarified.

## 2012-12-31 NOTE — Assessment & Plan Note (Signed)
Annual comprehensive exam was done including breast without pelvic and PAP smear was done . All screenings have been addressed .

## 2013-01-02 NOTE — Telephone Encounter (Signed)
Pt notified of labs and appointment scheduled for BP check. Pt verbalized understanding of stopping Meloxicam, any NSAID x 1 week

## 2013-01-03 DIAGNOSIS — L299 Pruritus, unspecified: Secondary | ICD-10-CM | POA: Diagnosis not present

## 2013-01-10 ENCOUNTER — Telehealth: Payer: Self-pay | Admitting: Internal Medicine

## 2013-01-10 DIAGNOSIS — L299 Pruritus, unspecified: Secondary | ICD-10-CM | POA: Diagnosis not present

## 2013-01-10 NOTE — Telephone Encounter (Signed)
Dr. Darrick Huntsman I have printed out the letter again and will be glad to fax to [facility] if that is where it needs to go. The letter is on your desk in the red folder for you to sign and advise if Dr. Tiburcio Pea is at [facility].

## 2013-01-10 NOTE — Telephone Encounter (Signed)
Dr. Johnathan Hausen office from [facility] was calling and said they had received a letter on Jamie Anderson about getting records on an operation she had on her ovaries but they are saying that they have never seen this patient before. She was thinking that the letter was sent to the wrong Dr. Tiburcio Pea it should have been sent to Dr. Tiburcio Pea with [facility] ???? I wasn't sure about letters that are sent out ???

## 2013-01-11 DIAGNOSIS — Z1231 Encounter for screening mammogram for malignant neoplasm of breast: Secondary | ICD-10-CM | POA: Diagnosis not present

## 2013-01-16 DIAGNOSIS — L299 Pruritus, unspecified: Secondary | ICD-10-CM | POA: Diagnosis not present

## 2013-01-16 DIAGNOSIS — L538 Other specified erythematous conditions: Secondary | ICD-10-CM | POA: Diagnosis not present

## 2013-01-17 ENCOUNTER — Ambulatory Visit (INDEPENDENT_AMBULATORY_CARE_PROVIDER_SITE_OTHER): Payer: Medicare Other | Admitting: *Deleted

## 2013-01-17 ENCOUNTER — Telehealth: Payer: Self-pay | Admitting: *Deleted

## 2013-01-17 VITALS — BP 179/74

## 2013-01-17 DIAGNOSIS — I10 Essential (primary) hypertension: Secondary | ICD-10-CM

## 2013-01-17 MED ORDER — LOSARTAN POTASSIUM-HCTZ 50-12.5 MG PO TABS: 1.0000 | ORAL_TABLET | Freq: Every day | ORAL | Status: DC

## 2013-01-17 NOTE — Telephone Encounter (Signed)
Patient notified as requested will callback for an appointment.

## 2013-01-17 NOTE — Telephone Encounter (Signed)
Patient came to have her BP checked today, it was 179/74 on our machine and on her machine it was 188/90. However she did cough while her machine was taking her BP. Stated she was taken off her Mobic at her last visit because you wanted to make sure that wasn't the cause of her BP being high. She has not taken any since her last visit as instructed but she has been having a lot of joint aches. Wanted me to make you aware of the joint aches she has been having lately. Awaiting a response on what she should do next.

## 2013-01-17 NOTE — Telephone Encounter (Signed)
She can resume the meloxicam  since it has not made a difference in her BP.  I am going to call in a medication for her to start taking for hypertension losartan Hct,  One tablet daily in the morning.  She needs to return for a RN Visit  and lab draw one week after starting it.

## 2013-01-19 ENCOUNTER — Telehealth: Payer: Self-pay | Admitting: Internal Medicine

## 2013-01-19 NOTE — Telephone Encounter (Signed)
Pt left vm 11/13.  Has questions about medication she picked up.  Please call.

## 2013-01-25 ENCOUNTER — Ambulatory Visit: Payer: Medicare Other | Admitting: *Deleted

## 2013-01-25 ENCOUNTER — Telehealth: Payer: Self-pay | Admitting: *Deleted

## 2013-01-25 ENCOUNTER — Other Ambulatory Visit (INDEPENDENT_AMBULATORY_CARE_PROVIDER_SITE_OTHER): Payer: Medicare Other

## 2013-01-25 VITALS — BP 168/82

## 2013-01-25 DIAGNOSIS — I10 Essential (primary) hypertension: Secondary | ICD-10-CM

## 2013-01-25 DIAGNOSIS — L299 Pruritus, unspecified: Secondary | ICD-10-CM | POA: Diagnosis not present

## 2013-01-25 NOTE — Progress Notes (Deleted)
  Subjective:    Patient ID: Jamie Anderson, female    DOB: 1935/12/12, 77 y.o.   MRN: [id number]  HPI    Review of Systems     Objective:   Physical Exam        Assessment & Plan:

## 2013-01-25 NOTE — Telephone Encounter (Signed)
I would like her to increase it to 2 tablets daily for goal BP < 140/80.  Return for bp check in one week after increasing dose

## 2013-01-25 NOTE — Progress Notes (Signed)
Pt presented for BP check. Has been taking Hyzaar 50/12.5 mg 1 tablet daily x 1 week without problems. Pt restarted her Meloxicam. No other medication changes.  BP 168/82. Dr. Tullo notified. Pt having labs rechecked today as well.  

## 2013-01-25 NOTE — Telephone Encounter (Signed)
Pt presented for BP check. Has been taking Hyzaar 50/12.5 mg 1 tablet daily x 1 week without problems. Pt restarted her Meloxicam. No other medication changes.  BP 168/82. Dr. Darrick Huntsman notified. Pt having labs rechecked today as well.

## 2013-01-26 ENCOUNTER — Ambulatory Visit: Payer: Medicare Other

## 2013-01-26 ENCOUNTER — Encounter: Payer: Self-pay | Admitting: Internal Medicine

## 2013-01-26 LAB — MICROALBUMIN / CREATININE URINE RATIO
Creatinine,U: 93.7 mg/dL
Microalb Creat Ratio: 3 mg/g (ref 0.0–30.0)
Microalb, Ur: 2.8 mg/dL — ABNORMAL HIGH (ref 0.0–1.9)

## 2013-01-26 LAB — BASIC METABOLIC PANEL
Calcium: 9.6 mg/dL (ref 8.4–10.5)
Chloride: 102 mEq/L (ref 96–112)
Creatinine, Ser: 1.1 mg/dL (ref 0.4–1.2)
Sodium: 136 mEq/L (ref 135–145)

## 2013-01-26 NOTE — Telephone Encounter (Signed)
Pt notified and verbalized understanding. Repeat BP check scheduled for 02/05/13. Advised will call with labs once they are resulted.

## 2013-01-28 MED ORDER — LOSARTAN POTASSIUM-HCTZ 50-12.5 MG PO TABS: 2.0000 | ORAL_TABLET | Freq: Every day | ORAL | Status: DC

## 2013-01-28 NOTE — Addendum Note (Signed)
Addended by: Sherlene Shams on: 01/28/2013 08:37 AM   Modules accepted: Orders

## 2013-01-31 ENCOUNTER — Other Ambulatory Visit: Payer: Medicare Other

## 2013-02-05 ENCOUNTER — Telehealth: Payer: Self-pay | Admitting: *Deleted

## 2013-02-05 ENCOUNTER — Other Ambulatory Visit (INDEPENDENT_AMBULATORY_CARE_PROVIDER_SITE_OTHER): Payer: Medicare Other

## 2013-02-05 ENCOUNTER — Ambulatory Visit: Payer: Medicare Other | Admitting: *Deleted

## 2013-02-05 DIAGNOSIS — L299 Pruritus, unspecified: Secondary | ICD-10-CM | POA: Diagnosis not present

## 2013-02-05 DIAGNOSIS — I10 Essential (primary) hypertension: Secondary | ICD-10-CM

## 2013-02-05 NOTE — Telephone Encounter (Signed)
FYI-  Pt presented to office for BP recheck. Has been taking losartan HCT 100/25 mg daily without difficulty. Denies any other changes in her medications. BP at office is 128/68. Pt also had labs done today.

## 2013-02-05 NOTE — Progress Notes (Signed)
Pt presented to office for BP recheck. Has been taking losartan HCT 100/25 mg daily without difficulty. Denies any other changes in her medications. BP at office is 128/68. Pt also had labs done today. Dr. Darrick Huntsman made aware.

## 2013-02-06 ENCOUNTER — Other Ambulatory Visit: Payer: Self-pay | Admitting: Internal Medicine

## 2013-02-06 DIAGNOSIS — R195 Other fecal abnormalities: Secondary | ICD-10-CM

## 2013-02-06 LAB — BASIC METABOLIC PANEL
BUN: 28 mg/dL — ABNORMAL HIGH (ref 6–23)
Calcium: 9.8 mg/dL (ref 8.4–10.5)
Creatinine, Ser: 1 mg/dL (ref 0.4–1.2)
GFR: 56.75 mL/min — ABNORMAL LOW (ref >60.00)
Glucose, Bld: 92 mg/dL (ref 70–99)
Sodium: 137 mEq/L (ref 135–145)

## 2013-02-13 DIAGNOSIS — L299 Pruritus, unspecified: Secondary | ICD-10-CM | POA: Diagnosis not present

## 2013-02-14 DIAGNOSIS — R197 Diarrhea, unspecified: Secondary | ICD-10-CM | POA: Diagnosis not present

## 2013-02-14 DIAGNOSIS — R195 Other fecal abnormalities: Secondary | ICD-10-CM | POA: Diagnosis not present

## 2013-02-15 ENCOUNTER — Other Ambulatory Visit: Payer: Self-pay | Admitting: Internal Medicine

## 2013-02-15 NOTE — Telephone Encounter (Signed)
Refill

## 2013-03-13 DIAGNOSIS — L299 Pruritus, unspecified: Secondary | ICD-10-CM | POA: Diagnosis not present

## 2013-03-19 DIAGNOSIS — L538 Other specified erythematous conditions: Secondary | ICD-10-CM | POA: Diagnosis not present

## 2013-03-19 DIAGNOSIS — L820 Inflamed seborrheic keratosis: Secondary | ICD-10-CM | POA: Diagnosis not present

## 2013-03-19 DIAGNOSIS — R209 Unspecified disturbances of skin sensation: Secondary | ICD-10-CM | POA: Diagnosis not present

## 2013-03-19 DIAGNOSIS — L299 Pruritus, unspecified: Secondary | ICD-10-CM | POA: Diagnosis not present

## 2013-03-19 DIAGNOSIS — L821 Other seborrheic keratosis: Secondary | ICD-10-CM | POA: Diagnosis not present

## 2013-03-21 ENCOUNTER — Other Ambulatory Visit: Payer: Self-pay | Admitting: Internal Medicine

## 2013-04-02 DIAGNOSIS — L299 Pruritus, unspecified: Secondary | ICD-10-CM | POA: Diagnosis not present

## 2013-04-04 ENCOUNTER — Telehealth: Payer: Self-pay | Admitting: *Deleted

## 2013-04-04 DIAGNOSIS — L299 Pruritus, unspecified: Secondary | ICD-10-CM | POA: Diagnosis not present

## 2013-04-04 NOTE — Telephone Encounter (Signed)
Losartan hct  (per chart)  1 tabl daily

## 2013-04-04 NOTE — Telephone Encounter (Signed)
Pharmacy Note:  Patient is requesting refill for losartan 100 mg with directions 1 tab daily. Pt history of losartan/ctz 100/25 mg with directions 1 tab daily, please clarify current therapy below

## 2013-04-04 NOTE — Telephone Encounter (Signed)
Please advise on which she should be taking

## 2013-04-05 NOTE — Telephone Encounter (Signed)
Pharmacy notified.

## 2013-04-11 DIAGNOSIS — L299 Pruritus, unspecified: Secondary | ICD-10-CM | POA: Diagnosis not present

## 2013-05-02 DIAGNOSIS — L299 Pruritus, unspecified: Secondary | ICD-10-CM | POA: Diagnosis not present

## 2013-05-24 ENCOUNTER — Other Ambulatory Visit: Payer: Self-pay | Admitting: Internal Medicine

## 2013-05-24 NOTE — Telephone Encounter (Signed)
Ok refill? 

## 2013-05-29 ENCOUNTER — Telehealth: Payer: Self-pay | Admitting: Internal Medicine

## 2013-05-29 DIAGNOSIS — I10 Essential (primary) hypertension: Secondary | ICD-10-CM

## 2013-05-29 MED ORDER — LOSARTAN POTASSIUM-HCTZ 50-12.5 MG PO TABS: 2.0000 | ORAL_TABLET | Freq: Every day | ORAL | Status: DC

## 2013-05-29 MED ORDER — CITALOPRAM HYDROBROMIDE 20 MG PO TABS: 20.0000 mg | ORAL_TABLET | Freq: Every day | ORAL | Status: DC

## 2013-05-29 NOTE — Telephone Encounter (Signed)
Patient notified script sent to pharmacy,

## 2013-05-29 NOTE — Telephone Encounter (Signed)
Ok to refill,  Refill sent to [facility]

## 2013-05-29 NOTE — Telephone Encounter (Signed)
Refill sent for Losartan HCTZ others filled previously stated by patient, Patient needs citalopram ok to fill last OV 10/14.

## 2013-05-29 NOTE — Telephone Encounter (Signed)
Pt states [facility] has faxed [place] requesting citalopram, allopurinol, losartan HCTZ with no response.  States everything is running out.

## 2013-06-04 ENCOUNTER — Other Ambulatory Visit: Payer: Self-pay | Admitting: Internal Medicine

## 2013-06-25 DIAGNOSIS — L299 Pruritus, unspecified: Secondary | ICD-10-CM | POA: Diagnosis not present

## 2013-06-25 DIAGNOSIS — T148 Other injury of unspecified body region: Secondary | ICD-10-CM | POA: Diagnosis not present

## 2013-06-25 DIAGNOSIS — L538 Other specified erythematous conditions: Secondary | ICD-10-CM | POA: Diagnosis not present

## 2013-06-25 DIAGNOSIS — W57XXXA Bitten or stung by nonvenomous insect and other nonvenomous arthropods, initial encounter: Secondary | ICD-10-CM | POA: Diagnosis not present

## 2013-07-02 DIAGNOSIS — L299 Pruritus, unspecified: Secondary | ICD-10-CM | POA: Diagnosis not present

## 2013-07-04 DIAGNOSIS — L299 Pruritus, unspecified: Secondary | ICD-10-CM | POA: Diagnosis not present

## 2013-07-06 ENCOUNTER — Other Ambulatory Visit: Payer: Self-pay | Admitting: *Deleted

## 2013-07-06 DIAGNOSIS — I10 Essential (primary) hypertension: Secondary | ICD-10-CM

## 2013-07-06 MED ORDER — LOSARTAN POTASSIUM-HCTZ 50-12.5 MG PO TABS: 2.0000 | ORAL_TABLET | Freq: Every day | ORAL | Status: DC

## 2013-07-09 ENCOUNTER — Other Ambulatory Visit: Payer: Self-pay | Admitting: *Deleted

## 2013-07-09 DIAGNOSIS — I10 Essential (primary) hypertension: Secondary | ICD-10-CM

## 2013-07-11 DIAGNOSIS — L299 Pruritus, unspecified: Secondary | ICD-10-CM | POA: Diagnosis not present

## 2013-07-20 ENCOUNTER — Other Ambulatory Visit: Payer: Self-pay | Admitting: Internal Medicine

## 2013-07-20 NOTE — Telephone Encounter (Signed)
Last visit 12/29/12, refill?

## 2013-07-24 NOTE — Telephone Encounter (Signed)
Ok to refill,  Refill sent  

## 2013-07-25 DIAGNOSIS — L299 Pruritus, unspecified: Secondary | ICD-10-CM | POA: Diagnosis not present

## 2013-08-01 DIAGNOSIS — L299 Pruritus, unspecified: Secondary | ICD-10-CM | POA: Diagnosis not present

## 2013-08-08 DIAGNOSIS — L299 Pruritus, unspecified: Secondary | ICD-10-CM | POA: Diagnosis not present

## 2013-08-21 ENCOUNTER — Ambulatory Visit: Payer: Medicare Other | Admitting: Adult Health

## 2013-08-22 DIAGNOSIS — L299 Pruritus, unspecified: Secondary | ICD-10-CM | POA: Diagnosis not present

## 2013-08-27 ENCOUNTER — Encounter: Payer: Self-pay | Admitting: Adult Health

## 2013-08-27 ENCOUNTER — Ambulatory Visit (INDEPENDENT_AMBULATORY_CARE_PROVIDER_SITE_OTHER): Payer: Medicare Other | Admitting: Adult Health

## 2013-08-27 VITALS — BP 143/77 | HR 81 | Temp 97.7°F | Resp 14 | Wt 186.8 lb

## 2013-08-27 DIAGNOSIS — M546 Pain in thoracic spine: Secondary | ICD-10-CM | POA: Diagnosis not present

## 2013-08-27 DIAGNOSIS — R1011 Right upper quadrant pain: Secondary | ICD-10-CM

## 2013-08-27 MED ORDER — BACLOFEN 10 MG PO TABS: 10.0000 mg | ORAL_TABLET | Freq: Two times a day (BID) | ORAL | Status: DC | PRN

## 2013-08-27 NOTE — Progress Notes (Signed)
Pre visit review using our clinic review tool, if applicable. No additional management support is needed unless otherwise documented below in the visit note. 

## 2013-08-27 NOTE — Patient Instructions (Signed)
  Start Baclofen for muscle spasms twice a day as needed. May cause sedation. Do not drive while taking this medication.  Do not take Aleve, ibuprofen, motrin while taking meloxicam. You may take tylenol  I am sending you for thoracic spine xray and for an ultrasound of the gallbladder.  We will contact you once the results are available.

## 2013-08-27 NOTE — Progress Notes (Signed)
Subjective:    Patient ID: Jamie Anderson, female    DOB: 01-15-1936, 78 y.o.   MRN: [id number]  Back Pain    Pleasant 77 yo caucasian female with a history of hypertension and hyperlipidemia presents today for right sided thoracic pain. This has been going on since about March/April and has stayed about the same. Describes the pain with a variety including sharp, dull, achy and occasionally itchy. Does not hurt with any motions. Has used ibuprofen which has dulled the pain, but not relieved it. Concerned for a pulled muscle. Also questions whether her gall bladder could have an effect as she was previously worked up for gall stones and fluctuates between constipation and diarrhea.    Past Medical History  Diagnosis Date  . Hyperlipidemia   . Hypertension   . Menopause   . Granuloma annulare     Current Outpatient Prescriptions on File Prior to Visit  Medication Sig Dispense Refill  . allopurinol (ZYLOPRIM) 100 MG tablet TAKE 1 TABLET EVERY DAY  90 tablet  3  . citalopram (CELEXA) 20 MG tablet Take 1 tablet (20 mg total) by mouth daily.  90 tablet  3  . clobetasol cream (TEMOVATE) 3.53 % Apply 1 application topically 2 (two) times daily.      . diazepam (VALIUM) 10 MG tablet Take 10 mg by mouth every 12 (twelve) hours as needed. Take 1/2 tablet every 12 hours as needed.      Marland Kitchen esomeprazole (NEXIUM) 20 MG capsule Take 20 mg by mouth daily before breakfast.      . fluticasone (FLONASE) 50 MCG/ACT nasal spray USE 2 SPRAYS IN EACH NOSTRIL EVERY DAY  48 g  6  . losartan-hydrochlorothiazide (HYZAAR) 50-12.5 MG per tablet Take 2 tablets by mouth daily.  180 tablet  0  . meloxicam (MOBIC) 15 MG tablet TAKE 1 TABLET EVERY DAY  90 tablet  1  . omeprazole (PRILOSEC) 20 MG capsule Take 20 mg by mouth daily.      . simvastatin (ZOCOR) 40 MG tablet TAKE 1 TABLET AT BEDTIME  (NEED OFFICE VISIT FOR FURTHER REFILLS)  90 tablet  1  . TDaP (BOOSTRIX) 5-2.5-18.5 LF-MCG/0.5 injection Inject 0.5 mLs into  the muscle once.  0.5 mL  0  . zoster vaccine live, PF, (ZOSTAVAX) 29924 UNT/0.65ML injection Inject 19,400 Units into the skin once.  1 each  0   No current facility-administered medications on file prior to visit.     Review of Systems  Constitutional: Negative.   HENT: Negative.   Respiratory: Negative.   Cardiovascular: Negative.   Gastrointestinal: Positive for diarrhea and constipation.  Musculoskeletal: Positive for back pain (right sided mid-thoracic).  Allergic/Immunologic: Negative.   Neurological: Negative.   Psychiatric/Behavioral: Negative.   All other systems reviewed and are negative.      Objective:  BP 143/77  Pulse 81  Temp(Src) 97.7 F (36.5 C) (Oral)  Resp 14  Wt 186 lb 12 oz (84.709 kg)  SpO2 98%   Physical Exam  Constitutional: She is oriented to person, place, and time. No distress.  HENT:  Head: Normocephalic.  Neck: Normal range of motion. Neck supple.  Cardiovascular: Normal rate, regular rhythm and normal heart sounds.   Pulmonary/Chest: Effort normal and breath sounds normal.  Abdominal: Soft.  Murphy's sign resulted in discomfortt  Musculoskeletal: Normal range of motion. She exhibits tenderness.  Bilateral shoulder AROM/PROM full and appropriate. RROM 4+/5 in all directions bilaterally, does not reproduce pain  Neurological: She is alert  and oriented to person, place, and time.  Skin: Skin is warm and dry.  Psychiatric: She has a normal mood and affect. Her behavior is normal. Judgment and thought content normal.          Assessment & Plan:   1. Right-sided thoracic back pain R/O fracture. Given baclofen 10 mg PO BID prn for spasm. May take tylenol as needed. Educated to not take meloxicam if taking ibuprofen or vice versa.   - DG Thoracic Spine 2 View; Future  2. Abdominal pain, right upper quadrant RUQ tender to touch and Murphy's sign increased discomfort. Obtain US to r/o referred pain from gall bladder to thoracic pain.    - US Abdomen Limited RUQ; Future

## 2013-08-27 NOTE — Progress Notes (Signed)
   Subjective:    Patient ID: Jamie Anderson, female    DOB: 03/08/1936, 78 y.o.   MRN: [id number]  HPI Pleasant 78 year old Caucasian female with history of hypertension and hyperlipidemia presents today for right sided thoracic pain. This has been ongoing since March/April and has stayed about the same. Describes the pain with a variety including sharp, dull, achy and occasionally itchy. Does not hurt with any motions. Has used ibuprofen which has controlled the pain, but not relieved it. Concerned for a pulled muscle. Also questions whether her gallbladder could have an effect as she was previously worked up for gallstones and it fluctuates between constipation and diarrhea.   Review of Systems  Gastrointestinal: Positive for abdominal pain (RUQ pain/discomfort), diarrhea and constipation.  Musculoskeletal: Positive for back pain (thoracic spine area).  All other systems reviewed and are negative.      Objective:   Physical Exam  Constitutional: She is oriented to person, place, and time. No distress.  Cardiovascular: Normal rate and regular rhythm.   Pulmonary/Chest: Effort normal. No respiratory distress.  Abdominal: Soft. She exhibits no mass. There is tenderness (with palpation of RUQ). There is no rebound and no guarding.  Musculoskeletal: Normal range of motion. She exhibits tenderness (with palpation of thoracic spine. Mainly towards the right side.).  Neurological: She is alert and oriented to person, place, and time.  Skin: Skin is warm and dry.  Psychiatric: She has a normal mood and affect. Her behavior is normal. Judgment and thought content normal.       Assessment & Plan:

## 2013-08-29 DIAGNOSIS — L299 Pruritus, unspecified: Secondary | ICD-10-CM | POA: Diagnosis not present

## 2013-08-31 ENCOUNTER — Ambulatory Visit: Payer: Self-pay | Admitting: Adult Health

## 2013-08-31 DIAGNOSIS — R1011 Right upper quadrant pain: Secondary | ICD-10-CM | POA: Diagnosis not present

## 2013-08-31 DIAGNOSIS — IMO0002 Reserved for concepts with insufficient information to code with codable children: Secondary | ICD-10-CM | POA: Diagnosis not present

## 2013-08-31 DIAGNOSIS — M47814 Spondylosis without myelopathy or radiculopathy, thoracic region: Secondary | ICD-10-CM | POA: Diagnosis not present

## 2013-09-05 ENCOUNTER — Other Ambulatory Visit: Payer: Self-pay | Admitting: Adult Health

## 2013-09-05 ENCOUNTER — Telehealth: Payer: Self-pay | Admitting: Internal Medicine

## 2013-09-05 MED ORDER — TRAMADOL HCL 50 MG PO TABS: 50.0000 mg | ORAL_TABLET | Freq: Three times a day (TID) | ORAL | Status: DC | PRN

## 2013-09-05 NOTE — Telephone Encounter (Signed)
Spoke with in office to notify her of results. Patient verbalized understanding and agreed to try PT. Patient states that she does not believe the cause of her back pain is from arthritis, She would like medication to help relieve this pain. A follow up appt has been scheduled to see her PCP. Please advise about medication.

## 2013-09-05 NOTE — Telephone Encounter (Signed)
The patient is still experiencing back pain and she wanting to know what her imaging results read.

## 2013-09-05 NOTE — Progress Notes (Signed)
Rx faxed to pharmacy. Tried to notify patient no answer, unable to leave a message. Voicemail box full.

## 2013-09-06 NOTE — Telephone Encounter (Signed)
Medication prescribed - Tramadol

## 2013-09-17 ENCOUNTER — Encounter: Payer: Self-pay | Admitting: Internal Medicine

## 2013-09-17 ENCOUNTER — Ambulatory Visit (INDEPENDENT_AMBULATORY_CARE_PROVIDER_SITE_OTHER): Payer: Medicare Other | Admitting: Internal Medicine

## 2013-09-17 VITALS — BP 140/70 | HR 64 | Temp 97.7°F | Resp 16 | Ht 64.0 in | Wt 185.5 lb

## 2013-09-17 DIAGNOSIS — M899 Disorder of bone, unspecified: Secondary | ICD-10-CM

## 2013-09-17 DIAGNOSIS — M949 Disorder of cartilage, unspecified: Secondary | ICD-10-CM

## 2013-09-17 DIAGNOSIS — M898X1 Other specified disorders of bone, shoulder: Secondary | ICD-10-CM

## 2013-09-17 MED ORDER — DIAZEPAM 10 MG PO TABS: 10.0000 mg | ORAL_TABLET | Freq: Two times a day (BID) | ORAL | Status: DC | PRN

## 2013-09-17 MED ORDER — MELOXICAM 15 MG PO TABS: ORAL_TABLET | ORAL | Status: DC

## 2013-09-17 NOTE — Patient Instructions (Signed)
Your back pain seems to be from a muscle spasm  meloxicam for anti inflammatory one daily  use the diazepam twice daly for muscle relaxer  Heating pad for 15 minutes  before you stretch,  Ice afterward   Ok to add tylenol up to 2000 mg daily

## 2013-09-17 NOTE — Progress Notes (Signed)
Patient ID: Jamie Anderson, female   DOB: Jan 24, 1936, 78 y.o.   MRN: [id number]  Patient Active Problem List   Diagnosis Date Noted  . Obesity, unspecified 12/31/2012  . Gluten-sensitive enteropathy 12/29/2012  . Anxiety state, unspecified 12/29/2012  . Memory loss, short term 11/23/2012  . Cystic teratoma of right ovary 11/23/2012  . Postmenopausal vaginal bleeding 04/29/2012  . Gout 04/13/2012  . Knee pain 04/03/2012  . Routine general medical examination at a health care facility 11/26/2011  . Hip pain, right 11/25/2011  . Cervicalgia 07/26/2011  . Other and unspecified hyperlipidemia 05/25/2011  . Hypertension 05/25/2011  . Depression 05/25/2011    Subjective:  CC:   Chief Complaint  Patient presents with  . Back Pain    upper back to right scapula.    HPI:   Jamie Anderson is a 78 y.o. female who presents for Persistent pain in her Upper back over the right scapula.  Has been present  for several months with no significant improvement.  No history of falls or unusual activity but she is Very active outside and inside and may have lifted something with an outstretched arm that was too heavy.  She has been taking  Baclofen without improvement.  Gallbladder and pancreatic disease considered by [facility] and ruled out with abd ultrasound,  Fractures of vertebra ruled out with thoracic spine films.    Past Medical History  Diagnosis Date  . Hyperlipidemia   . Hypertension   . Menopause   . Granuloma annulare     Past Surgical History  Procedure Laterality Date  . Spine surgery  2010  . Appendectomy    . Venous ablation      bilateral  . Dilation and curettage of uterus    . Right oophorectomy Right feb 2014    [name]       The following portions of the patient's history were reviewed and updated as appropriate: Allergies, current medications, and problem list.    Review of Systems:   Patient denies headache, fevers, malaise, unintentional weight loss,  skin rash, eye pain, sinus congestion and sinus pain, sore throat, dysphagia,  hemoptysis , cough, dyspnea, wheezing, chest pain, palpitations, orthopnea, edema, abdominal pain, nausea, melena, diarrhea, constipation, flank pain, dysuria, hematuria, urinary  Frequency, nocturia, numbness, tingling, seizures,  Focal weakness, Loss of consciousness,  Tremor, insomnia, depression, anxiety, and suicidal ideation.     History   Social History  . Marital Status: Married    Spouse Name: N/A    Number of Children: N/A  . Years of Education: N/A   Occupational History  . Not on file.   Social History Main Topics  . Smoking status: Never Smoker   . Smokeless tobacco: Not on file  . Alcohol Use: 2.4 oz/week    4 Glasses of wine per week  . Drug Use: Not on file  . Sexual Activity: Not Currently   Other Topics Concern  . Not on file   Social History Narrative  . No narrative on file    Objective:  Filed Vitals:   09/17/13 1735  BP: 140/70  Pulse: 64  Temp: 97.7 F (36.5 C)  Resp: 16     General appearance: alert, cooperative and appears stated age Ears: normal TM's and external ear canals both ears Throat: lips, mucosa, and tongue normal; teeth and gums normal Neck: no adenopathy, no carotid bruit, supple, symmetrical, trachea midline and thyroid not enlarged, symmetric, no tenderness/mass/nodules Back: symmetric, no curvature. ROM  normal. No CVA tenderness. Tenderness over right shoulder blade noted with muscle spasm noted.  Lungs: clear to auscultation bilaterally Heart: regular rate and rhythm, S1, S2 normal, no murmur, click, rub or gallop Abdomen: soft, non-tender; bowel sounds normal; no masses,  no organomegaly Pulses: 2+ and symmetric Skin: Skin color, texture, turgor normal. No rashes or lesions Lymph nodes: Cervical, supraclavicular, and axillary nodes normal. Neuro: CNs 2-12 intact. DTRs 2+/4 in biceps, brachioradialis, patellars and achilles. Muscle strength 5/5 in  upper and lower exremities. Fine resting tremor bilaterally both hands cerebellar function normal. Romberg negative.  No pronator drift.   Gait normal.    Assessment and Plan:  Pain of right scapula Recurrent, prior workup reviewed.  exam consistent with muscle spasm of rotator cuff muscles.,  NSAID and valium ,  Activity modification.  No scoliosis or vertebral tenderness on exam.     Updated Medication List Outpatient Encounter Prescriptions as of 09/17/2013  Medication Sig  . allopurinol (ZYLOPRIM) 100 MG tablet TAKE 1 TABLET EVERY DAY  . baclofen (LIORESAL) 10 MG tablet Take 1 tablet (10 mg total) by mouth 2 (two) times daily as needed for muscle spasms.  . citalopram (CELEXA) 20 MG tablet Take 1 tablet (20 mg total) by mouth daily.  . clobetasol cream (TEMOVATE) 9.93 % Apply 1 application topically 2 (two) times daily.  . diazepam (VALIUM) 10 MG tablet Take 1 tablet (10 mg total) by mouth every 12 (twelve) hours as needed. For muscle spasm  . esomeprazole (NEXIUM) 20 MG capsule Take 20 mg by mouth daily before breakfast.  . fluticasone (FLONASE) 50 MCG/ACT nasal spray USE 2 SPRAYS IN EACH NOSTRIL EVERY DAY  . losartan-hydrochlorothiazide (HYZAAR) 50-12.5 MG per tablet Take 2 tablets by mouth daily.  . meloxicam (MOBIC) 15 MG tablet TAKE 1 TABLET EVERY DAY  . omeprazole (PRILOSEC) 20 MG capsule Take 20 mg by mouth daily.  . simvastatin (ZOCOR) 40 MG tablet TAKE 1 TABLET AT BEDTIME  (NEED OFFICE VISIT FOR FURTHER REFILLS)  . TDaP (BOOSTRIX) 5-2.5-18.5 LF-MCG/0.5 injection Inject 0.5 mLs into the muscle once.  . traMADol (ULTRAM) 50 MG tablet Take 1 tablet (50 mg total) by mouth every 8 (eight) hours as needed.  . [DISCONTINUED] diazepam (VALIUM) 10 MG tablet Take 10 mg by mouth every 12 (twelve) hours as needed. Take 1/2 tablet every 12 hours as needed.  . [DISCONTINUED] meloxicam (MOBIC) 15 MG tablet TAKE 1 TABLET EVERY DAY  . zoster vaccine live, PF, (ZOSTAVAX) 71696 UNT/0.65ML  injection Inject 19,400 Units into the skin once.     No orders of the defined types were placed in this encounter.    No Follow-up on file.

## 2013-09-17 NOTE — Progress Notes (Signed)
Pre-visit discussion using our clinic review tool. No additional management support is needed unless otherwise documented below in the visit note.  

## 2013-09-18 NOTE — Assessment & Plan Note (Signed)
Recurrent, prior workup reviewed.  exam consistent with muscle spasm of rotator cuff muscles.,  NSAID and valium ,  Activity modification.  No scoliosis or vertebral tenderness on exam.

## 2013-09-24 ENCOUNTER — Encounter: Payer: Self-pay | Admitting: Adult Health

## 2013-10-01 DIAGNOSIS — L299 Pruritus, unspecified: Secondary | ICD-10-CM | POA: Diagnosis not present

## 2013-11-16 ENCOUNTER — Other Ambulatory Visit: Payer: Self-pay | Admitting: Internal Medicine

## 2013-12-05 DIAGNOSIS — Z23 Encounter for immunization: Secondary | ICD-10-CM | POA: Diagnosis not present

## 2014-01-03 ENCOUNTER — Encounter: Payer: Self-pay | Admitting: Internal Medicine

## 2014-01-03 ENCOUNTER — Ambulatory Visit (INDEPENDENT_AMBULATORY_CARE_PROVIDER_SITE_OTHER): Payer: Medicare Other | Admitting: Internal Medicine

## 2014-01-03 VITALS — BP 138/78 | HR 84 | Temp 97.9°F | Resp 16 | Ht 64.0 in | Wt 188.2 lb

## 2014-01-03 DIAGNOSIS — Z1211 Encounter for screening for malignant neoplasm of colon: Secondary | ICD-10-CM

## 2014-01-03 DIAGNOSIS — E785 Hyperlipidemia, unspecified: Secondary | ICD-10-CM

## 2014-01-03 DIAGNOSIS — Z0000 Encounter for general adult medical examination without abnormal findings: Secondary | ICD-10-CM

## 2014-01-03 DIAGNOSIS — F411 Generalized anxiety disorder: Secondary | ICD-10-CM | POA: Diagnosis not present

## 2014-01-03 DIAGNOSIS — Z23 Encounter for immunization: Secondary | ICD-10-CM | POA: Diagnosis not present

## 2014-01-03 DIAGNOSIS — E669 Obesity, unspecified: Secondary | ICD-10-CM | POA: Diagnosis not present

## 2014-01-03 DIAGNOSIS — M1000 Idiopathic gout, unspecified site: Secondary | ICD-10-CM

## 2014-01-03 DIAGNOSIS — Z1239 Encounter for other screening for malignant neoplasm of breast: Secondary | ICD-10-CM

## 2014-01-03 DIAGNOSIS — R5383 Other fatigue: Secondary | ICD-10-CM

## 2014-01-03 DIAGNOSIS — Z1159 Encounter for screening for other viral diseases: Secondary | ICD-10-CM

## 2014-01-03 DIAGNOSIS — I10 Essential (primary) hypertension: Secondary | ICD-10-CM | POA: Diagnosis not present

## 2014-01-03 MED ORDER — SERTRALINE HCL 100 MG PO TABS: 100.0000 mg | ORAL_TABLET | Freq: Every day | ORAL | Status: DC

## 2014-01-03 MED ORDER — PANTOPRAZOLE SODIUM 40 MG PO TBEC: 40.0000 mg | DELAYED_RELEASE_TABLET | Freq: Every day | ORAL | Status: DC

## 2014-01-03 NOTE — Patient Instructions (Addendum)
I recommend getting the majority of your calcium and Vitamin D  through diet rather than supplements given the recent association of calcium supplements with increased coronary artery calcium scores (You need 1200 mg daily )   Unsweetened coconut milk is a great low calorie low carb, cholesterol free  way to increase your dietary calcium and vitamin D.   I am CHANGING YOUR CITALOPRAM TO GENERIC ZOLOFT  To help your anxiety.    I am changing [place]  Return for fasting labs AND we will provide you with a colon ca screening test     Food Choices for Gastroesophageal Reflux Disease When you have gastroesophageal reflux disease (GERD), the foods you eat and your eating habits are very important. Choosing the right foods can help ease the discomfort of GERD. WHAT GENERAL GUIDELINES DO I NEED TO FOLLOW?  Choose fruits, vegetables, whole grains, low-fat dairy products, and low-fat meat, fish, and poultry.  Limit fats such as oils, salad dressings, butter, nuts, and avocado.  Keep a food diary to identify foods that cause symptoms.  Avoid foods that cause reflux. These may be different for different people.  Eat frequent small meals instead of three large meals each day.  Eat your meals slowly, in a relaxed setting.  Limit fried foods.  Cook foods using methods other than frying.  Avoid drinking alcohol.  Avoid drinking large amounts of liquids with your meals.  Avoid bending over or lying down until 2-3 hours after eating. WHAT FOODS ARE NOT RECOMMENDED? The following are some foods and drinks that may worsen your symptoms: Vegetables Tomatoes. Tomato juice. Tomato and spaghetti sauce. Chili peppers. Onion and garlic. Horseradish. Fruits Oranges, grapefruit, and lemon (fruit and juice). Meats High-fat meats, fish, and poultry. This includes hot dogs, ribs, ham, sausage, salami, and bacon. Dairy Whole milk and chocolate milk. Sour cream. Cream.  Butter. Ice cream. Cream cheese.  Beverages Coffee and tea, with or without caffeine. Carbonated beverages or energy drinks. Condiments Hot sauce. Barbecue sauce.  Sweets/Desserts Chocolate and cocoa. Donuts. Peppermint and spearmint. Fats and Oils High-fat foods, including [place] fries and potato chips. Other Vinegar. Strong spices, such as black pepper, white pepper, red pepper, cayenne, curry powder, cloves, ginger, and chili powder. The items listed above may not be a complete list of foods and beverages to avoid. Contact your dietitian for more information. Document Released: 02/22/2005 Document Revised: 02/27/2013 Document Reviewed: 12/27/2012 [facility] Patient Information 2015 [place], [place]. This information is not intended to replace advice given to you by your health care provider. Make sure you discuss any questions you have with your health care provider.   Health Maintenance Adopting a healthy lifestyle and getting preventive care can go a long way to promote health and wellness. Talk with your health care provider about what schedule of regular examinations is right for you. This is a good chance for you to check in with your provider about disease prevention and staying healthy. In between checkups, there are plenty of things you can do on your own. Experts have done a lot of research about which lifestyle changes and preventive measures are most likely to keep you healthy. Ask your health care provider for more information. WEIGHT AND DIET  Eat a healthy diet  Be sure to include plenty of vegetables, fruits, low-fat dairy products, and lean protein.  Do not eat a lot of foods high in solid fats, added sugars, or salt.  Get regular exercise. This  is one of the most important things you can do for your health.  Most adults should exercise for at least 150 minutes each week. The exercise should increase your heart rate and make you sweat (moderate-intensity exercise).  Most  adults should also do strengthening exercises at least twice a week. This is in addition to the moderate-intensity exercise.  Maintain a healthy weight  Body mass index (BMI) is a measurement that can be used to identify possible weight problems. It estimates body fat based on height and weight. Your health care provider can help determine your BMI and help you achieve or maintain a healthy weight.  For females 15 years of age and older:   A BMI below 18.5 is considered underweight.  A BMI of 18.5 to 24.9 is normal.  A BMI of 25 to 29.9 is considered overweight.  A BMI of 30 and above is considered obese.  Watch levels of cholesterol and blood lipids  You should start having your blood tested for lipids and cholesterol at 76 years of age, then have this test every 5 years.  You may need to have your cholesterol levels checked more often if:  Your lipid or cholesterol levels are high.  You are older than 76 years of age.  You are at high risk for heart disease.  CANCER SCREENING   Lung Cancer  Lung cancer screening is recommended for adults 25-65 years old who are at high risk for lung cancer because of a history of smoking.  A yearly low-dose CT scan of the lungs is recommended for people who:  Currently smoke.  Have quit within the past 15 years.  Have at least a 30-pack-year history of smoking. A pack year is smoking an average of one pack of cigarettes a day for 1 year.  Yearly screening should continue until it has been 15 years since you quit.  Yearly screening should stop if you develop a health problem that would prevent you from having lung cancer treatment.  Breast Cancer  Practice breast self-awareness. This means understanding how your breasts normally appear and feel.  It also means doing regular breast self-exams. Let your health care provider know about any changes, no matter how small.  If you are in your 20s or 30s, you should have a clinical  breast exam (CBE) by a health care provider every 1-3 years as part of a regular health exam.  If you are 68 or older, have a CBE every year. Also consider having a breast X-ray (mammogram) every year.  If you have a family history of breast cancer, talk to your health care provider about genetic screening.  If you are at high risk for breast cancer, talk to your health care provider about having an MRI and a mammogram every year.  Breast cancer gene (BRCA) assessment is recommended for women who have family members with BRCA-related cancers. BRCA-related cancers include:  Breast.  Ovarian.  Tubal.  Peritoneal cancers.  Results of the assessment will determine the need for genetic counseling and BRCA1 and BRCA2 testing. Cervical Cancer Routine pelvic examinations to screen for cervical cancer are no longer recommended for nonpregnant women who are considered low risk for cancer of the pelvic organs (ovaries, uterus, and vagina) and who do not have symptoms. A pelvic examination may be necessary if you have symptoms including those associated with pelvic infections. Ask your health care provider if a screening pelvic exam is right for you.   The Pap test is  the screening test for cervical cancer for women who are considered at risk.  If you had a hysterectomy for a problem that was not cancer or a condition that could lead to cancer, then you no longer need Pap tests.  If you are older than 65 years, and you have had normal Pap tests for the past 10 years, you no longer need to have Pap tests.  If you have had past treatment for cervical cancer or a condition that could lead to cancer, you need Pap tests and screening for cancer for at least 20 years after your treatment.  If you no longer get a Pap test, assess your risk factors if they change (such as having a new sexual partner). This can affect whether you should start being screened again.  Some women have medical problems that  increase their chance of getting cervical cancer. If this is the case for you, your health care provider may recommend more frequent screening and Pap tests.  The human papillomavirus (HPV) test is another test that may be used for cervical cancer screening. The HPV test looks for the virus that can cause cell changes in the cervix. The cells collected during the Pap test can be tested for HPV.  The HPV test can be used to screen women 24 years of age and older. Getting tested for HPV can extend the interval between normal Pap tests from three to five years.  An HPV test also should be used to screen women of any age who have unclear Pap test results.  After 76 years of age, women should have HPV testing as often as Pap tests.  Colorectal Cancer  This type of cancer can be detected and often prevented.  Routine colorectal cancer screening usually begins at 76 years of age and continues through 76 years of age.  Your health care provider may recommend screening at an earlier age if you have risk factors for colon cancer.  Your health care provider may also recommend using home test kits to check for hidden blood in the stool.  A small camera at the end of a tube can be used to examine your colon directly (sigmoidoscopy or colonoscopy). This is done to check for the earliest forms of colorectal cancer.  Routine screening usually begins at age 71.  Direct examination of the colon should be repeated every 5-10 years through 76 years of age. However, you may need to be screened more often if early forms of precancerous polyps or small growths are found. Skin Cancer  Check your skin from head to toe regularly.  Tell your health care provider about any new moles or changes in moles, especially if there is a change in a mole's shape or color.  Also tell your health care provider if you have a mole that is larger than the size of a pencil eraser.  Always use sunscreen. Apply sunscreen  liberally and repeatedly throughout the day.  Protect yourself by wearing long sleeves, pants, a wide-brimmed hat, and sunglasses whenever you are outside. HEART DISEASE, DIABETES, AND HIGH BLOOD PRESSURE   Have your blood pressure checked at least every 1-2 years. High blood pressure causes heart disease and increases the risk of stroke.  If you are between 39 years and 59 years old, ask your health care provider if you should take aspirin to prevent strokes.  Have regular diabetes screenings. This involves taking a blood sample to check your fasting blood sugar level.  If you are  at a normal weight and have a low risk for diabetes, have this test once every three years after 76 years of age.  If you are overweight and have a high risk for diabetes, consider being tested at a younger age or more often. PREVENTING INFECTION  Hepatitis B  If you have a higher risk for hepatitis B, you should be screened for this virus. You are considered at high risk for hepatitis B if:  You were born in a country where hepatitis B is common. Ask your health care provider which countries are considered high risk.  Your parents were born in a high-risk country, and you have not been immunized against hepatitis B (hepatitis B vaccine).  You have HIV or AIDS.  You use needles to inject street drugs.  You live with someone who has hepatitis B.  You have had sex with someone who has hepatitis B.  You get hemodialysis treatment.  You take certain medicines for conditions, including cancer, organ transplantation, and autoimmune conditions. Hepatitis C  Blood testing is recommended for:  Everyone born from [birth year] through 1963.  Anyone with known risk factors for hepatitis C. Sexually transmitted infections (STIs)  You should be screened for sexually transmitted infections (STIs) including gonorrhea and chlamydia if:  You are sexually active and are younger than 76 years of age.  You are older than  76 years of age and your health care provider tells you that you are at risk for this type of infection.  Your sexual activity has changed since you were last screened and you are at an increased risk for chlamydia or gonorrhea. Ask your health care provider if you are at risk.  If you do not have HIV, but are at risk, it may be recommended that you take a prescription medicine daily to prevent HIV infection. This is called pre-exposure prophylaxis (PrEP). You are considered at risk if:  You are sexually active and do not regularly use condoms or know the HIV status of your partner(s).  You take drugs by injection.  You are sexually active with a partner who has HIV. Talk with your health care provider about whether you are at high risk of being infected with HIV. If you choose to begin PrEP, you should first be tested for HIV. You should then be tested every 3 months for as long as you are taking PrEP.  PREGNANCY   If you are premenopausal and you may become pregnant, ask your health care provider about preconception counseling.  If you may become pregnant, take 400 to 800 micrograms (mcg) of folic acid every day.  If you want to prevent pregnancy, talk to your health care provider about birth control (contraception). OSTEOPOROSIS AND MENOPAUSE   Osteoporosis is a disease in which the bones lose minerals and strength with aging. This can result in serious bone fractures. Your risk for osteoporosis can be identified using a bone density scan.  If you are 35 years of age or older, or if you are at risk for osteoporosis and fractures, ask your health care provider if you should be screened.  Ask your health care provider whether you should take a calcium or vitamin D supplement to lower your risk for osteoporosis.  Menopause may have certain physical symptoms and risks.  Hormone replacement therapy may reduce some of these symptoms and risks. Talk to your health care provider about  whether hormone replacement therapy is right for you.  HOME CARE INSTRUCTIONS   Schedule regular  health, dental, and eye exams.  Stay current with your immunizations.   Do not use any tobacco products including cigarettes, chewing tobacco, or electronic cigarettes.  If you are pregnant, do not drink alcohol.  If you are breastfeeding, limit how much and how often you drink alcohol.  Limit alcohol intake to no more than 1 drink per day for nonpregnant women. One drink equals 12 ounces of beer, 5 ounces of wine, or 1 ounces of hard liquor.  Do not use street drugs.  Do not share needles.  Ask your health care provider for help if you need support or information about quitting drugs.  Tell your health care provider if you often feel depressed.  Tell your health care provider if you have ever been abused or do not feel safe at home. Document Released: 09/07/2010 Document Revised: 07/09/2013 Document Reviewed: 01/24/2013 [facility] Patient Information 2015 [place], [place]. This information is not intended to replace advice given to you by your health care provider. Make sure you discuss any questions you have with your health care provider.

## 2014-01-03 NOTE — Progress Notes (Signed)
Pre-visit discussion using our clinic review tool. No additional management support is needed unless otherwise documented below in the visit note.  

## 2014-01-03 NOTE — Assessment & Plan Note (Addendum)
No flare since starting allopurinol last year .

## 2014-01-03 NOTE — Progress Notes (Signed)
Patient ID: Jamie Anderson, female   DOB: 10-Apr-1935, 78 y.o.   MRN: [id number]  The patient is here for annual Medicare wellness examination and management of other chronic and acute problems.   The risk factors are reflected in the social history.  The roster of all physicians providing medical care to patient - is listed in the Snapshot section of the chart.  Activities of daily living:  The patient is 100% independent in all ADLs: dressing, toileting, feeding as well as independent mobility  Home safety : The patient has smoke detectors in the home. They wear seatbelts.  There are no firearms at home. There is no violence in the home.   There is no risks for hepatitis, STDs or HIV. There is no   history of blood transfusion. They have no travel history to infectious disease endemic areas of the world.  The patient has seen their dentist in the last six month. They have seen their eye doctor in the last year. They admit to slight hearing difficulty with regard to whispered voices and some television programs.  They have deferred audiologic testing in the last year.  They do not  have excessive sun exposure. Discussed the need for sun protection: hats, long sleeves and use of sunscreen if there is significant sun exposure.   Diet: the importance of a healthy diet is discussed. They do have a healthy diet.  She has a history of  allergies to almonds and pecans, and has a "sweet tooth."  The benefits of regular aerobic exercise were discussed. She is not walking regularly due to bilateral knee pain,  Right greater than left.    Depression screen: there are no signs or vegative symptoms of depression- irritability, change in appetite, anhedonia, sadness/tearfullness. However she is having insomnia and anxiety and is still "counting" a lot.   Cognitive assessment: the patient manages all their financial and personal affairs and is actively engaged. They could relate day,date,year and events;  recalled 2/3 objects at 3 minutes; performed clock-face test normally.  The following portions of the patient's history were reviewed and updated as appropriate: allergies, current medications, past family history, past medical history,  past surgical history, past social history  and problem list.  Visual acuity was not assessed per patient preference since she has regular follow up with her ophthalmologist. Hearing and body mass index were assessed and reviewed.   During the course of the visit the patient was educated and counseled about appropriate screening and preventive services including : fall prevention , diabetes screening, nutrition counseling, colorectal cancer screening, and recommended immunizations.    Objective:  BP 138/78  Pulse 84  Temp(Src) 97.9 F (36.6 C) (Oral)  Resp 16  Ht 5\' 4"  (1.626 m)  Wt 188 lb 4 oz (85.39 kg)  BMI 32.30 kg/m2  SpO2 96%  General appearance: alert, cooperative and appears stated age Head: Normocephalic, without obvious abnormality, atraumatic Eyes: conjunctivae/corneas clear. PERRL, EOM's intact. Fundi benign. Ears: normal TM's and external ear canals both ears Nose: Nares normal. Septum midline. Mucosa normal. No drainage or sinus tenderness. Throat: lips, mucosa, and tongue normal; teeth and gums normal Neck: no adenopathy, no carotid bruit, no JVD, supple, symmetrical, trachea midline and thyroid not enlarged, symmetric, no tenderness/mass/nodules Lungs: clear to auscultation bilaterally Breasts: normal appearance, no masses or tenderness Heart: regular rate and rhythm, S1, S2 normal, no murmur, click, rub or gallop Abdomen: soft, non-tender; bowel sounds normal; no masses,  no organomegaly Extremities: extremities normal,  atraumatic, no cyanosis or edema Pulses: 2+ and symmetric Skin: Skin color, texture, turgor normal. No rashes or lesions Neurologic: Alert and oriented X 3, normal strength and tone. Normal symmetric reflexes. Normal  coordination and gait.   Assessment and Plan:  Gout No flare since starting allopurinol last year .   Obesity I have addressed  BMI and recommended wt loss of 10% of body weigh over the next 6 months using a low glycemic index diet and regular exercise a minimum of 5 days per week.    Obsessive compulsive disorder She continues to report episodes of counting that occur daily and are compulsive done. I suggested that her anxiety state may be due to OCD Discussed trial of sertaline. And stopping citalopram.    Hypertension Well controlled on current regimen. She is overdue for assessment of renal function .  No changes today   Lab Results  Component Value Date   CREATININE 1.0 02/05/2013      Hyperlipidemia LDL goal <130 LDL and triglycerides  Have been at goal on current medications. she has no side effects and is due for repeat fasting labs.  No changes today  Lab Results  Component Value Date   CHOL 189 11/22/2012   HDL 71.80 11/22/2012   LDLCALC 100* 11/22/2012   TRIG 86.0 11/22/2012   CHOLHDL 3 11/22/2012   Lab Results  Component Value Date   ALT 14 11/22/2012   AST 20 11/22/2012   ALKPHOS 97 11/22/2012   BILITOT 0.5 11/22/2012        Medicare annual wellness visit, subsequent Annual Medicare wellness  exam was done as well as a comprehensive physical exam and management of acute and chronic conditions .  During the course of the visit the patient was educated and counseled about appropriate screening and preventive services including : fall prevention , diabetes screening, nutrition counseling, colorectal cancer screening, and recommended immunizations.  Printed recommendations for health maintenance screenings was given.     Updated Medication List Outpatient Encounter Prescriptions as of 01/03/2014  Medication Sig  . allopurinol (ZYLOPRIM) 100 MG tablet TAKE 1 TABLET EVERY DAY  . baclofen (LIORESAL) 10 MG tablet Take 1 tablet (10 mg total) by mouth 2 (two) times  daily as needed for muscle spasms.  . clobetasol cream (TEMOVATE) 7.86 % Apply 1 application topically 2 (two) times daily.  . diazepam (VALIUM) 10 MG tablet Take 1 tablet (10 mg total) by mouth every 12 (twelve) hours as needed. For muscle spasm  . fluticasone (FLONASE) 50 MCG/ACT nasal spray USE 2 SPRAYS IN EACH NOSTRIL EVERY DAY  . losartan-hydrochlorothiazide (HYZAAR) 50-12.5 MG per tablet TAKE 2 TABLETS BY MOUTH DAILY.  . meloxicam (MOBIC) 15 MG tablet TAKE 1 TABLET EVERY DAY  . omeprazole (PRILOSEC) 20 MG capsule Take 20 mg by mouth daily.  . pantoprazole (PROTONIX) 40 MG tablet Take 1 tablet (40 mg total) by mouth daily.  . sertraline (ZOLOFT) 100 MG tablet Take 1 tablet (100 mg total) by mouth daily.  . simvastatin (ZOCOR) 40 MG tablet TAKE 1 TABLET AT BEDTIME  (NEED OFFICE VISIT FOR FURTHER REFILLS)  . TDaP (BOOSTRIX) 5-2.5-18.5 LF-MCG/0.5 injection Inject 0.5 mLs into the muscle once.  . traMADol (ULTRAM) 50 MG tablet Take 1 tablet (50 mg total) by mouth every 8 (eight) hours as needed.  . zoster vaccine live, PF, (ZOSTAVAX) 76720 UNT/0.65ML injection Inject 19,400 Units into the skin once.  . [DISCONTINUED] citalopram (CELEXA) 20 MG tablet Take 1 tablet (20 mg total)  by mouth daily.  . [DISCONTINUED] esomeprazole (NEXIUM) 20 MG capsule Take 20 mg by mouth daily before breakfast.   t

## 2014-01-04 ENCOUNTER — Other Ambulatory Visit: Payer: Self-pay | Admitting: Internal Medicine

## 2014-01-05 NOTE — Assessment & Plan Note (Signed)
Well controlled on current regimen. She is overdue for assessment of renal function .  No changes today   Lab Results  Component Value Date   CREATININE 1.0 02/05/2013

## 2014-01-05 NOTE — Assessment & Plan Note (Addendum)
She continues to report episodes of counting that occur daily and are compulsive done. I suggested that her anxiety state may be due to OCD Discussed trial of sertaline. And stopping citalopram.

## 2014-01-05 NOTE — Assessment & Plan Note (Signed)

## 2014-01-05 NOTE — Assessment & Plan Note (Signed)
LDL and triglycerides  Have been at goal on current medications. she has no side effects and is due for repeat fasting labs.  No changes today  Lab Results  Component Value Date   CHOL 189 11/22/2012   HDL 71.80 11/22/2012   LDLCALC 100* 11/22/2012   TRIG 86.0 11/22/2012   CHOLHDL 3 11/22/2012   Lab Results  Component Value Date   ALT 14 11/22/2012   AST 20 11/22/2012   ALKPHOS 97 11/22/2012   BILITOT 0.5 11/22/2012

## 2014-01-05 NOTE — Assessment & Plan Note (Signed)
I have addressed  BMI and recommended wt loss of 10% of body weigh over the next 6 months using a low glycemic index diet and regular exercise a minimum of 5 days per week.   

## 2014-01-10 DIAGNOSIS — Z961 Presence of intraocular lens: Secondary | ICD-10-CM | POA: Diagnosis not present

## 2014-01-15 DIAGNOSIS — L853 Xerosis cutis: Secondary | ICD-10-CM | POA: Diagnosis not present

## 2014-01-15 DIAGNOSIS — L821 Other seborrheic keratosis: Secondary | ICD-10-CM | POA: Diagnosis not present

## 2014-01-15 DIAGNOSIS — L570 Actinic keratosis: Secondary | ICD-10-CM | POA: Diagnosis not present

## 2014-01-15 DIAGNOSIS — L920 Granuloma annulare: Secondary | ICD-10-CM | POA: Diagnosis not present

## 2014-01-15 DIAGNOSIS — L578 Other skin changes due to chronic exposure to nonionizing radiation: Secondary | ICD-10-CM | POA: Diagnosis not present

## 2014-01-15 DIAGNOSIS — I8393 Asymptomatic varicose veins of bilateral lower extremities: Secondary | ICD-10-CM | POA: Diagnosis not present

## 2014-01-16 ENCOUNTER — Other Ambulatory Visit: Payer: Self-pay | Admitting: Internal Medicine

## 2014-01-17 DIAGNOSIS — Z1231 Encounter for screening mammogram for malignant neoplasm of breast: Secondary | ICD-10-CM | POA: Diagnosis not present

## 2014-01-17 LAB — HM MAMMOGRAPHY: HM MAMMO: NEGATIVE

## 2014-01-18 ENCOUNTER — Encounter: Payer: Self-pay | Admitting: *Deleted

## 2014-01-28 ENCOUNTER — Telehealth: Payer: Self-pay

## 2014-01-28 MED ORDER — CITALOPRAM HYDROBROMIDE 20 MG PO TABS: 20.0000 mg | ORAL_TABLET | Freq: Every day | ORAL | Status: DC

## 2014-01-28 NOTE — Telephone Encounter (Signed)
Pt notified and  verbalized understanding. Can she change back to the citalopram she was taking, has it at home?

## 2014-01-28 NOTE — Telephone Encounter (Signed)
Yes she can .  Start with 1/2 tablet daily for few days,  Then increase to whole tablet,  20 mg

## 2014-01-28 NOTE — Telephone Encounter (Signed)
Pt states she started Sertraline and Pantoprazole a few weeks ago, has been having nausea and intermittent upper abdominal pain x 1-2 weeks. Denies fever, no change in bowel habits. No vomiting. No sharp constant pain. Wants to know if could be related to new meds. States the pain/nausea usually comes on in the afternoons.

## 2014-01-28 NOTE — Telephone Encounter (Signed)
The patient called and stated she is having stomach discomfort.  She is wanting to know if this could be related to the two new medications she has started taking.  She did not elaborate on her stomach symptoms, except to say it felt like "discomfort"

## 2014-01-28 NOTE — Telephone Encounter (Signed)
Pt notified and verbalized understanding.

## 2014-01-28 NOTE — Telephone Encounter (Signed)
Could be the sertraline,  Have her suspend it for a week and continue the pantoprazole .  Avoid all NSAIDS

## 2014-02-06 ENCOUNTER — Emergency Department: Payer: Self-pay | Admitting: Emergency Medicine

## 2014-02-06 DIAGNOSIS — K8010 Calculus of gallbladder with chronic cholecystitis without obstruction: Secondary | ICD-10-CM | POA: Diagnosis not present

## 2014-02-06 DIAGNOSIS — D259 Leiomyoma of uterus, unspecified: Secondary | ICD-10-CM | POA: Diagnosis not present

## 2014-02-06 DIAGNOSIS — I10 Essential (primary) hypertension: Secondary | ICD-10-CM | POA: Diagnosis not present

## 2014-02-06 DIAGNOSIS — I700 Atherosclerosis of aorta: Secondary | ICD-10-CM | POA: Diagnosis not present

## 2014-02-06 DIAGNOSIS — R1012 Left upper quadrant pain: Secondary | ICD-10-CM | POA: Diagnosis not present

## 2014-02-06 DIAGNOSIS — Z79899 Other long term (current) drug therapy: Secondary | ICD-10-CM | POA: Diagnosis not present

## 2014-02-06 LAB — URINALYSIS, COMPLETE
Bilirubin,UR: NEGATIVE
Blood: NEGATIVE
Glucose,UR: NEGATIVE mg/dL (ref 0–75)
KETONE: NEGATIVE
Leukocyte Esterase: NEGATIVE
Nitrite: NEGATIVE
PH: 6 (ref 4.5–8.0)
Protein: NEGATIVE
RBC,UR: <1 /HPF (ref 0–5)
SPECIFIC GRAVITY: 1.012 (ref 1.003–1.030)
Squamous Epithelial: 2

## 2014-02-06 LAB — COMPREHENSIVE METABOLIC PANEL
ALT: 23 U/L
ANION GAP: 8 (ref 7–16)
AST: 17 U/L (ref 15–37)
Albumin: 3.7 g/dL (ref 3.4–5.0)
Alkaline Phosphatase: 134 U/L — ABNORMAL HIGH
BUN: 23 mg/dL — AB (ref 7–18)
Bilirubin,Total: 0.3 mg/dL (ref 0.2–1.0)
CALCIUM: 9.5 mg/dL (ref 8.5–10.1)
CO2: 27 mmol/L (ref 21–32)
CREATININE: 1.08 mg/dL (ref 0.60–1.30)
Chloride: 104 mmol/L (ref 98–107)
EGFR (Non-African Amer.): 52 — ABNORMAL LOW
Glucose: 89 mg/dL (ref 65–99)
Osmolality: 281 (ref 275–301)
Potassium: 4 mmol/L (ref 3.5–5.1)
SODIUM: 139 mmol/L (ref 136–145)
Total Protein: 6.9 g/dL (ref 6.4–8.2)

## 2014-02-06 LAB — CBC WITH DIFFERENTIAL/PLATELET
Basophil #: 0 10*3/uL (ref 0.0–0.1)
Basophil %: 0.6 %
EOS PCT: 1.9 %
Eosinophil #: 0.1 10*3/uL (ref 0.0–0.7)
HCT: 39.4 % (ref 35.0–47.0)
HGB: 13.1 g/dL (ref 12.0–16.0)
LYMPHS PCT: 22.2 %
Lymphocyte #: 1.6 10*3/uL (ref 1.0–3.6)
MCH: 29.6 pg (ref 26.0–34.0)
MCHC: 33.3 g/dL (ref 32.0–36.0)
MCV: 89 fL (ref 80–100)
MONOS PCT: 7.5 %
Monocyte #: 0.5 x10 3/mm (ref 0.2–0.9)
Neutrophil #: 4.7 10*3/uL (ref 1.4–6.5)
Neutrophil %: 67.8 %
PLATELETS: 194 10*3/uL (ref 150–440)
RBC: 4.43 10*6/uL (ref 3.80–5.20)
RDW: 14.6 % — AB (ref 11.5–14.5)
WBC: 7 10*3/uL (ref 3.6–11.0)

## 2014-02-06 LAB — LIPASE, BLOOD: LIPASE: 204 U/L (ref 73–393)

## 2014-02-06 LAB — TROPONIN I: Troponin-I: <0.02 ng/mL

## 2014-03-04 ENCOUNTER — Other Ambulatory Visit: Payer: Self-pay | Admitting: *Deleted

## 2014-03-04 NOTE — Telephone Encounter (Signed)
Pt left VM, requesting refill on Simvastatin. Called and advised pt she is overdue for labs. Scheduled fasting lab appt 03/06/14. Pt states she has 1 week of tablets left. Will refill once labs are back to [facility],  verbalized understanding

## 2014-03-06 ENCOUNTER — Other Ambulatory Visit (INDEPENDENT_AMBULATORY_CARE_PROVIDER_SITE_OTHER): Payer: Medicare Other

## 2014-03-06 DIAGNOSIS — R5383 Other fatigue: Secondary | ICD-10-CM | POA: Diagnosis not present

## 2014-03-06 DIAGNOSIS — Z1159 Encounter for screening for other viral diseases: Secondary | ICD-10-CM

## 2014-03-06 DIAGNOSIS — E785 Hyperlipidemia, unspecified: Secondary | ICD-10-CM

## 2014-03-06 DIAGNOSIS — I10 Essential (primary) hypertension: Secondary | ICD-10-CM

## 2014-03-06 LAB — COMPREHENSIVE METABOLIC PANEL
ALT: 22 U/L (ref 0–35)
AST: 24 U/L (ref 0–37)
Albumin: 4.2 g/dL (ref 3.5–5.2)
Alkaline Phosphatase: 109 U/L (ref 39–117)
BILIRUBIN TOTAL: 0.6 mg/dL (ref 0.2–1.2)
BUN: 21 mg/dL (ref 6–23)
CO2: 25 mEq/L (ref 19–32)
Calcium: 9.8 mg/dL (ref 8.4–10.5)
Chloride: 107 mEq/L (ref 96–112)
Creatinine, Ser: 1 mg/dL (ref 0.4–1.2)
GFR: 57.24 mL/min — ABNORMAL LOW (ref >60.00)
Glucose, Bld: 93 mg/dL (ref 70–99)
Potassium: 4.3 mEq/L (ref 3.5–5.1)
Sodium: 141 mEq/L (ref 135–145)
Total Protein: 7.1 g/dL (ref 6.0–8.3)

## 2014-03-06 LAB — CBC WITH DIFFERENTIAL/PLATELET
BASOS PCT: 0.5 % (ref 0.0–3.0)
Basophils Absolute: 0 10*3/uL (ref 0.0–0.1)
EOS ABS: 0.2 10*3/uL (ref 0.0–0.7)
Eosinophils Relative: 3.6 % (ref 0.0–5.0)
HEMATOCRIT: 40.6 % (ref 36.0–46.0)
Hemoglobin: 13.4 g/dL (ref 12.0–15.0)
LYMPHS PCT: 20 % (ref 12.0–46.0)
Lymphs Abs: 1.3 10*3/uL (ref 0.7–4.0)
MCHC: 33.1 g/dL (ref 30.0–36.0)
MCV: 88.6 fl (ref 78.0–100.0)
MONOS PCT: 6.6 % (ref 3.0–12.0)
Monocytes Absolute: 0.4 10*3/uL (ref 0.1–1.0)
NEUTROS PCT: 69.3 % (ref 43.0–77.0)
Neutro Abs: 4.4 10*3/uL (ref 1.4–7.7)
Platelets: 188 10*3/uL (ref 150.0–400.0)
RBC: 4.59 Mil/uL (ref 3.87–5.11)
RDW: 15.1 % (ref 11.5–15.5)
WBC: 6.4 10*3/uL (ref 4.0–10.5)

## 2014-03-06 LAB — LIPID PANEL
CHOL/HDL RATIO: 3
CHOLESTEROL: 199 mg/dL (ref 0–200)
HDL: 74.1 mg/dL (ref >39.00)
LDL CALC: 97 mg/dL (ref 0–99)
NonHDL: 124.9
TRIGLYCERIDES: 139 mg/dL (ref 0.0–149.0)
VLDL: 27.8 mg/dL (ref 0.0–40.0)

## 2014-03-06 LAB — TSH: TSH: 2.51 u[IU]/mL (ref 0.35–4.50)

## 2014-03-07 LAB — HEPATITIS C ANTIBODY: HCV Ab: NEGATIVE

## 2014-03-11 ENCOUNTER — Encounter: Payer: Self-pay | Admitting: *Deleted

## 2014-03-12 ENCOUNTER — Other Ambulatory Visit: Payer: Self-pay | Admitting: *Deleted

## 2014-03-12 DIAGNOSIS — E785 Hyperlipidemia, unspecified: Secondary | ICD-10-CM

## 2014-03-12 MED ORDER — SIMVASTATIN 40 MG PO TABS: ORAL_TABLET | ORAL | Status: DC

## 2014-03-28 ENCOUNTER — Other Ambulatory Visit: Payer: Self-pay | Admitting: Internal Medicine

## 2014-04-01 NOTE — Telephone Encounter (Signed)
90 day supply authorized and sent  TO [facility]

## 2014-04-01 NOTE — Telephone Encounter (Signed)
Last visit 01/03/14, ok refill? 

## 2014-04-05 ENCOUNTER — Telehealth: Payer: Self-pay | Admitting: Internal Medicine

## 2014-04-05 NOTE — Telephone Encounter (Signed)
Patient Name: Jamie Anderson DOB: [birth year] Initial Comment Caller states c/o head congestion and headache Nurse Assessment Nurse: Erlene Quan, RN, Manuela Schwartz Date/Time Eilene Ghazi Time): 04/05/2014 12:20:19 PM Confirm and document reason for call. If symptomatic, describe symptoms. ---Caller states c/o head congestion and headache - possible sinuses - started as head ache then nose became stuffed up - she has been using nasal sprays and not really helping - no fever and no swelling in face Has the patient traveled out of the country within the last 30 days? ---Not Applicable Does the patient require triage? ---Yes Related visit to physician within the last 2 weeks? ---No Does the PT have any chronic conditions? (i.e. diabetes, asthma, etc.) ---Yes List chronic conditions. ---high blood pressure Guidelines Guideline Title Affirmed Question Affirmed Notes Sinus Pain or Congestion [1] Sinus congestion as part of a cold AND [2] present < 10 days (all triage questions negative) Final Disposition User [place], RN, Manuela Schwartz

## 2014-04-08 ENCOUNTER — Encounter: Payer: Self-pay | Admitting: Internal Medicine

## 2014-04-08 ENCOUNTER — Ambulatory Visit (INDEPENDENT_AMBULATORY_CARE_PROVIDER_SITE_OTHER): Payer: Medicare Other | Admitting: Internal Medicine

## 2014-04-08 VITALS — BP 146/78 | HR 78 | Temp 97.6°F | Resp 16 | Ht 64.0 in | Wt 184.8 lb

## 2014-04-08 DIAGNOSIS — J069 Acute upper respiratory infection, unspecified: Secondary | ICD-10-CM | POA: Diagnosis not present

## 2014-04-08 MED ORDER — PREDNISONE 10 MG PO TABS: ORAL_TABLET | ORAL | Status: DC

## 2014-04-08 MED ORDER — AMOXICILLIN-POT CLAVULANATE 875-125 MG PO TABS: 1.0000 | ORAL_TABLET | Freq: Two times a day (BID) | ORAL | Status: DC

## 2014-04-08 NOTE — Patient Instructions (Addendum)
  You have a viral syndrome which is causing sinus congestion .    The post nasal drip may also be contributing to  your  Sore throat   I am prescribing a prednisone taper to manage the inflammation in your sinuses  I also advise use of the following OTC meds to help with your other symptoms.   Take generic OTC benadryl 25 mg  At bedtime for the drainage, and Afrin for congestion   flush your sinuses twice daily with Simply Saline    if you develop fever (T > 100.4,)  Green or blood streaked nasal discharge,  Or facial pain, start the antibiotic (augmentin).  Please take a probiotic ( Align, Floraque or Culturelle) while you are on the antibiotic to prevent  the  serious antibiotic associated diarrhea  Called clostridium dificile colitis and a vaginal yeast infection   You can use the diazepam for your nerves.  Start with 1/2 a tablet

## 2014-04-08 NOTE — Progress Notes (Signed)
Patient ID: Jamie Anderson, female   DOB: 06-23-1935, 79 y.o.   MRN: [id number]   Patient Active Problem List   Diagnosis Date Noted  . Acute upper respiratory infection 04/09/2014  . Obesity 12/31/2012  . Gluten-sensitive enteropathy 12/29/2012  . Obsessive compulsive disorder 12/29/2012  . Memory loss, short term 11/23/2012  . Cystic teratoma of right ovary 11/23/2012  . Postmenopausal vaginal bleeding 04/29/2012  . Gout 04/13/2012  . Knee pain 04/03/2012  . Medicare annual wellness visit, subsequent 11/26/2011  . Hip pain, right 11/25/2011  . Cervicalgia 07/26/2011  . Hyperlipidemia LDL goal <130 05/25/2011  . Hypertension 05/25/2011  . Depression 05/25/2011    Subjective:  CC:   Chief Complaint  Patient presents with  . Sinusitis    sinus congestion and headache , ear stopped up  . Cough    HPI:   Jamie Anderson is a 79 y.o. female who presents for  One week history of headache and sinus congestion,  Has been taking coricidin (combo antihistamine, detromethrophan and tylenol)  And Afrin at bedtime . After a few days she noted no  improvement,  Called Triage RN RN suggested she stop  Both and start using tylenol and resume fluticasone nasal spray.  Currently her left ear feels stopped up but not painful.  Her headache is better,  But not gone.  Her sinuses are congested and has had scant occasional bloody discharge,  maxillary sinuses are tender.    No cough or fevers ,  No nausea    Past Medical History  Diagnosis Date  . Hyperlipidemia   . Hypertension   . Menopause   . Granuloma annulare     Past Surgical History  Procedure Laterality Date  . Spine surgery  2010  . Appendectomy    . Venous ablation      bilateral  . Dilation and curettage of uterus    . Right oophorectomy Right feb 2014    [name]       The following portions of the patient's history were reviewed and updated as appropriate: Allergies, current medications, and problem  list.    Review of Systems:   Patient denies headache, fevers, malaise, unintentional weight loss, skin rash, eye pain, sinus congestion and sinus pain, sore throat, dysphagia,  hemoptysis , cough, dyspnea, wheezing, chest pain, palpitations, orthopnea, edema, abdominal pain, nausea, melena, diarrhea, constipation, flank pain, dysuria, hematuria, urinary  Frequency, nocturia, numbness, tingling, seizures,  Focal weakness, Loss of consciousness,  Tremor, insomnia, depression, anxiety, and suicidal ideation.     History   Social History  . Marital Status: Married    Spouse Name: N/A    Number of Children: N/A  . Years of Education: N/A   Occupational History  . Not on file.   Social History Main Topics  . Smoking status: Never Smoker   . Smokeless tobacco: Not on file  . Alcohol Use: 2.4 oz/week    4 Glasses of wine per week  . Drug Use: Not on file  . Sexual Activity: Not Currently   Other Topics Concern  . Not on file   Social History Narrative    Objective:  Filed Vitals:   04/08/14 1820  BP: 146/78  Pulse: 78  Temp: 97.6 F (36.4 C)  Resp: 16     General appearance: alert, cooperative and appears stated age Ears: normal TM's and external ear canals both ears Throat: lips, mucosa, and tongue normal; teeth and gums normal Neck: no adenopathy, no  carotid bruit, supple, symmetrical, trachea midline and thyroid not enlarged, symmetric, no tenderness/mass/nodules Back: symmetric, no curvature. ROM normal. No CVA tenderness. Lungs: clear to auscultation bilaterally Heart: regular rate and rhythm, S1, S2 normal, no murmur, click, rub or gallop Abdomen: soft, non-tender; bowel sounds normal; no masses,  no organomegaly Pulses: 2+ and symmetric Skin: Skin color, texture, turgor normal. No rashes or lesions Lymph nodes: Cervical, supraclavicular, and axillary nodes normal.  Assessment and Plan:  Acute upper respiratory infection Her exam and symptoms suggest viral  URI.  Will prescribe prednisone taper and resume Afrin for congestion,  Advised to start antibiotic id symptoms persist more than 3 days and take a probiotic     Updated Medication List Outpatient Encounter Prescriptions as of 04/08/2014  Medication Sig  . allopurinol (ZYLOPRIM) 100 MG tablet TAKE 1 TABLET EVERY DAY  . baclofen (LIORESAL) 10 MG tablet Take 1 tablet (10 mg total) by mouth 2 (two) times daily as needed for muscle spasms.  . fluticasone (FLONASE) 50 MCG/ACT nasal spray USE 2 SPRAYS IN EACH NOSTRIL EVERY DAY  . losartan-hydrochlorothiazide (HYZAAR) 50-12.5 MG per tablet TAKE 2 TABLETS BY MOUTH DAILY.  . meloxicam (MOBIC) 15 MG tablet TAKE 1 TABLET EVERY DAY  . omeprazole (PRILOSEC) 20 MG capsule Take 20 mg by mouth daily.  . pantoprazole (PROTONIX) 40 MG tablet Take 1 tablet (40 mg total) by mouth daily.  . sertraline (ZOLOFT) 100 MG tablet Take 100 mg by mouth daily.  . simvastatin (ZOCOR) 40 MG tablet TAKE 1 TABLET AT BEDTIME  . [DISCONTINUED] diazepam (VALIUM) 10 MG tablet Take 1 tablet (10 mg total) by mouth every 12 (twelve) hours as needed. For muscle spasm  . amoxicillin-clavulanate (AUGMENTIN) 875-125 MG per tablet Take 1 tablet by mouth 2 (two) times daily.  . clobetasol cream (TEMOVATE) 2.53 % Apply 1 application topically 2 (two) times daily.  . predniSONE (DELTASONE) 10 MG tablet 6 tablets on Day 1 , then reduce by 1 tablet daily until gone  . traMADol (ULTRAM) 50 MG tablet Take 1 tablet (50 mg total) by mouth every 8 (eight) hours as needed. (Patient not taking: Reported on 04/08/2014)  . [DISCONTINUED] citalopram (CELEXA) 20 MG tablet Take 1 tablet (20 mg total) by mouth daily. (Patient not taking: Reported on 04/08/2014)  . [DISCONTINUED] TDaP (BOOSTRIX) 5-2.5-18.5 LF-MCG/0.5 injection Inject 0.5 mLs into the muscle once.  . [DISCONTINUED] zoster vaccine live, PF, (ZOSTAVAX) 66440 UNT/0.65ML injection Inject 19,400 Units into the skin once.     No orders of the  defined types were placed in this encounter.    No Follow-up on file.

## 2014-04-08 NOTE — Progress Notes (Signed)
Pre-visit discussion using our clinic review tool. No additional management support is needed unless otherwise documented below in the visit note.  

## 2014-04-09 ENCOUNTER — Telehealth: Payer: Self-pay | Admitting: Internal Medicine

## 2014-04-09 ENCOUNTER — Encounter: Payer: Self-pay | Admitting: Internal Medicine

## 2014-04-09 DIAGNOSIS — J069 Acute upper respiratory infection, unspecified: Secondary | ICD-10-CM | POA: Insufficient documentation

## 2014-04-09 MED ORDER — DIAZEPAM 10 MG PO TABS: 10.0000 mg | ORAL_TABLET | Freq: Two times a day (BID) | ORAL | Status: DC | PRN

## 2014-04-09 MED ORDER — DIAZEPAM 5 MG PO TABS: 5.0000 mg | ORAL_TABLET | Freq: Two times a day (BID) | ORAL | Status: DC | PRN

## 2014-04-09 NOTE — Telephone Encounter (Signed)
rx faxed

## 2014-04-09 NOTE — Telephone Encounter (Signed)
I have changed it to 5 mg and reprinted

## 2014-04-09 NOTE — Telephone Encounter (Signed)
Patient arrived home checked to see if she had kept the diazepam but she had is it ok to fill? I have placed script in quick sign folder if ok?

## 2014-04-09 NOTE — Assessment & Plan Note (Signed)
Her exam and symptoms suggest viral URI.  Will prescribe prednisone taper and resume Afrin for congestion,  Advised to start antibiotic id symptoms persist more than 3 days and take a probiotic

## 2014-05-05 DIAGNOSIS — R030 Elevated blood-pressure reading, without diagnosis of hypertension: Secondary | ICD-10-CM | POA: Diagnosis not present

## 2014-05-05 DIAGNOSIS — N3001 Acute cystitis with hematuria: Secondary | ICD-10-CM | POA: Diagnosis not present

## 2014-05-05 DIAGNOSIS — R300 Dysuria: Secondary | ICD-10-CM | POA: Diagnosis not present

## 2014-05-06 ENCOUNTER — Telehealth: Payer: Self-pay | Admitting: Internal Medicine

## 2014-05-06 NOTE — Telephone Encounter (Signed)
Patient called Triage on 05/04/14 and was advised to go to Urgent care patient follow ed advised and was diagnosed with UTI . Given Cipro 500 Mg advised patient your recommendations are to take a probiotic with the ABX and to continue for at least 3 weeks patient stated she is currently taking due to last ABX given by MD she was advised to do so. FYI

## 2014-05-15 ENCOUNTER — Telehealth: Payer: Self-pay

## 2014-05-15 MED ORDER — FLUCONAZOLE 150 MG PO TABS
150.0000 mg | ORAL_TABLET | Freq: Every day | ORAL | Status: DC
Start: 2014-05-15 — End: 2015-01-15

## 2014-05-15 NOTE — Telephone Encounter (Signed)
Pt notified and verbalized understanding.

## 2014-05-15 NOTE — Telephone Encounter (Signed)
Sent rx for Fluconazole one tablet daily for 2 days,  Ito treat yeast infection.  Make an appt if not better after treatment

## 2014-05-15 NOTE — Telephone Encounter (Signed)
Vaginal area raw itching and burning, patient stated she did take a probiotic,Patient cannot leave husband due to illness, Has no sitter. Recent ABX for UTI.

## 2014-05-15 NOTE — Telephone Encounter (Signed)
The patient called and stated that after having a UTI, her vaginal area is "raw".  She was offered every upcoming appointment we have available at this office, and several appointments at the [facility] office, but refused each one.  She stated her husband was sick and she is unable to come in.  She is hoping for a call back to discuss what her options can be to get treated.

## 2014-05-17 ENCOUNTER — Encounter: Payer: Self-pay | Admitting: Internal Medicine

## 2014-05-17 ENCOUNTER — Telehealth: Payer: Self-pay | Admitting: Internal Medicine

## 2014-05-17 ENCOUNTER — Ambulatory Visit (INDEPENDENT_AMBULATORY_CARE_PROVIDER_SITE_OTHER): Payer: Medicare Other | Admitting: Internal Medicine

## 2014-05-17 VITALS — BP 138/78 | HR 67 | Temp 98.3°F | Resp 16 | Ht 64.0 in | Wt 186.0 lb

## 2014-05-17 DIAGNOSIS — R300 Dysuria: Secondary | ICD-10-CM | POA: Diagnosis not present

## 2014-05-17 DIAGNOSIS — N760 Acute vaginitis: Secondary | ICD-10-CM | POA: Diagnosis not present

## 2014-05-17 LAB — POCT URINALYSIS DIPSTICK
BILIRUBIN UA: NEGATIVE
GLUCOSE UA: NEGATIVE
Nitrite, UA: NEGATIVE
Protein, UA: NEGATIVE
RBC UA: NEGATIVE
SPEC GRAV UA: 1.015
Urobilinogen, UA: 1
pH, UA: 6.5

## 2014-05-17 MED ORDER — CLOBETASOL PROPIONATE 0.05 % EX CREA: 1.0000 "application " | TOPICAL_CREAM | Freq: Two times a day (BID) | CUTANEOUS | Status: DC

## 2014-05-17 NOTE — Progress Notes (Signed)
Patient ID: Jamie Anderson, female   DOB: Oct 08, 1935, 79 y.o.   MRN: [id number]   Patient Active Problem List   Diagnosis Date Noted  . Vaginitis and vulvovaginitis 05/19/2014  . Acute upper respiratory infection 04/09/2014  . Obesity 12/31/2012  . Gluten-sensitive enteropathy 12/29/2012  . Obsessive compulsive disorder 12/29/2012  . Memory loss, short term 11/23/2012  . Cystic teratoma of right ovary 11/23/2012  . Postmenopausal vaginal bleeding 04/29/2012  . Gout 04/13/2012  . Knee pain 04/03/2012  . Medicare annual wellness visit, subsequent 11/26/2011  . Hip pain, right 11/25/2011  . Cervicalgia 07/26/2011  . Hyperlipidemia LDL goal <130 05/25/2011  . Hypertension 05/25/2011  . Depression 05/25/2011    Subjective:  CC:   Chief Complaint  Patient presents with  . Acute Visit    treated for UTI by urgent care, then developed the burning, itching.    HPI:   Jamie Anderson is a 79 y.o. female who presents for  Vaginal irritation.  She was TREATED FOR UTI  BY URGENT CARE with ciprofloxacin 500 mg bid , which she finished FINISHED 5 days ago.  (Antibiotic identified per phone call to pharmacy).   She developed itching and burnign and was treated  empirically with fluconazole 150 mg bid, last dose today, and is here for persistent symptoms.  Denies  diischarge    Past Medical History  Diagnosis Date  . Hyperlipidemia   . Hypertension   . Menopause   . Granuloma annulare     Past Surgical History  Procedure Laterality Date  . Spine surgery  2010  . Appendectomy    . Venous ablation      bilateral  . Dilation and curettage of uterus    . Right oophorectomy Right feb 2014    [name]       The following portions of the patient's history were reviewed and updated as appropriate: Allergies, current medications, and problem list.    Review of Systems:   Patient denies headache, fevers, malaise, unintentional weight loss, skin rash, eye pain, sinus congestion and  sinus pain, sore throat, dysphagia,  hemoptysis , cough, dyspnea, wheezing, chest pain, palpitations, orthopnea, edema, abdominal pain, nausea, melena, diarrhea, constipation, flank pain, dysuria, hematuria, urinary  Frequency, nocturia, numbness, tingling, seizures,  Focal weakness, Loss of consciousness,  Tremor, insomnia, depression, anxiety, and suicidal ideation.     History   Social History  . Marital Status: Married    Spouse Name: N/A  . Number of Children: N/A  . Years of Education: N/A   Occupational History  . Not on file.   Social History Main Topics  . Smoking status: Never Smoker   . Smokeless tobacco: Not on file  . Alcohol Use: 2.4 oz/week    4 Glasses of wine per week  . Drug Use: Not on file  . Sexual Activity: Not Currently   Other Topics Concern  . Not on file   Social History Narrative    Objective:  Filed Vitals:   05/17/14 1647  BP: 138/78  Pulse: 67  Temp: 98.3 F (36.8 C)  Resp: 16     General Appearance:    Alert, cooperative, no distress, appears stated age  Head:    Normocephalic, without obvious abnormality, atraumatic  Eyes:    PERRL, conjunctiva/corneas clear, EOM's intact, fundi    benign, both eyes  Ears:    Normal TM's and external ear canals, both ears  Nose:   Nares normal, septum midline, mucosa normal,  no drainage    or sinus tenderness  Throat:   Lips, mucosa, and tongue normal; teeth and gums normal  Neck:   Supple, symmetrical, trachea midline, no adenopathy;    thyroid:  no enlargement/tenderness/nodules; no carotid   bruit or JVD  Back:     Symmetric, no curvature, ROM normal, no CVA tenderness  Lungs:     Clear to auscultation bilaterally, respirations unlabored  Chest Wall:    No tenderness or deformity   Heart:    Regular rate and rhythm, S1 and S2 normal, no murmur, rub   or gallop  Breast Exam:    No tenderness, masses, or nipple abnormality  Abdomen:     Soft, non-tender, bowel sounds active all four quadrants,     no masses, no organomegaly  Genitalia:    Pelvic: diffuse eryrhema of vulva, cervix normal in appearance,  no cervical motion tenderness, rectovaginal septum normal, uterus normal size, shape, and consistency and vagina normal without discharge  Extremities:   Extremities normal, atraumatic, no cyanosis or edema                Assessment and Plan:  Vaginitis and vulvovaginitis Exam notes erythema of vulva without vaginal discharge or erythema in the vault.  Clobetasol cream bid x 1 week.     Updated Medication List Outpatient Encounter Prescriptions as of 05/17/2014  Medication Sig  . allopurinol (ZYLOPRIM) 100 MG tablet TAKE 1 TABLET EVERY DAY  . diazepam (VALIUM) 5 MG tablet Take 1 tablet (5 mg total) by mouth every 12 (twelve) hours as needed. For muscle spasm  . dicyclomine (BENTYL) 10 MG capsule Take 10 mg by mouth 4 (four) times daily -  before meals and at bedtime.  . fluticasone (FLONASE) 50 MCG/ACT nasal spray USE 2 SPRAYS IN EACH NOSTRIL EVERY DAY  . losartan-hydrochlorothiazide (HYZAAR) 50-12.5 MG per tablet TAKE 2 TABLETS BY MOUTH DAILY.  . meloxicam (MOBIC) 15 MG tablet TAKE 1 TABLET EVERY DAY  . pantoprazole (PROTONIX) 40 MG tablet Take 1 tablet (40 mg total) by mouth daily.  . sertraline (ZOLOFT) 100 MG tablet Take 100 mg by mouth daily.  . simvastatin (ZOCOR) 40 MG tablet TAKE 1 TABLET AT BEDTIME  . traMADol (ULTRAM) 50 MG tablet Take 1 tablet (50 mg total) by mouth every 8 (eight) hours as needed.  Marland Kitchen amoxicillin-clavulanate (AUGMENTIN) 875-125 MG per tablet Take 1 tablet by mouth 2 (two) times daily. (Patient not taking: Reported on 05/17/2014)  . baclofen (LIORESAL) 10 MG tablet Take 1 tablet (10 mg total) by mouth 2 (two) times daily as needed for muscle spasms. (Patient not taking: Reported on 05/17/2014)  . clobetasol cream (TEMOVATE) 2.40 % Apply 1 application topically 2 (two) times daily. FOR ONE WEEK  . fluconazole (DIFLUCAN) 150 MG tablet Take 1 tablet (150  mg total) by mouth daily. (Patient not taking: Reported on 05/17/2014)  . omeprazole (PRILOSEC) 20 MG capsule Take 20 mg by mouth daily.  . predniSONE (DELTASONE) 10 MG tablet 6 tablets on Day 1 , then reduce by 1 tablet daily until gone (Patient not taking: Reported on 05/17/2014)  . [DISCONTINUED] clobetasol cream (TEMOVATE) 9.73 % Apply 1 application topically 2 (two) times daily.     Orders Placed This Encounter  Procedures  . Urine Culture  . Urinalysis, Routine w reflex microscopic  . Urine Microscopic  . POCT Urinalysis Dipstick    No Follow-up on file.

## 2014-05-17 NOTE — Patient Instructions (Signed)
You were treated with Cipro for you last UTI ,  But we are checking your urine again  You do not have a yeast infection,  But you ARE very irritated  I am treating you with a high potency steroid cream twice daily apply with your fingers.

## 2014-05-17 NOTE — Telephone Encounter (Signed)
[facility] Patient Name: Jamie Anderson DOB: 03-Nov-1935 Initial Comment Caller states she has not received callback, no previous call found, caller was disconnected after phone started beeping, called the caller back but no answer Nurse Assessment Nurse: Ronnald Ramp, RN, Miranda Date/Time (Eastern Time): 05/17/2014 10:11:12 AM Confirm and document reason for call. If symptomatic, describe symptoms. ---Caller states she had been having trouble calling this morning because the phone kept disconnecting. She was seen in UC for UTI and then had symptoms of yeast infection. She is still having symptoms and needs to be seen. She has already called the office and has an appt for this afternoon. Triage office and declined. Has the patient traveled out of the country within the last 30 days? ---Not Applicable Does the patient require triage? ---Declined Triage Guidelines Guideline Title Affirmed Question Affirmed Notes Final Disposition User Comments Checked EPIC and there was a mobile number listed, [phone], will try to reach pt on that number.

## 2014-05-17 NOTE — Progress Notes (Signed)
Pre-visit discussion using our clinic review tool. No additional management support is needed unless otherwise documented below in the visit note.  

## 2014-05-17 NOTE — Telephone Encounter (Signed)
fyi

## 2014-05-18 LAB — URINALYSIS, ROUTINE W REFLEX MICROSCOPIC
BILIRUBIN URINE: NEGATIVE
GLUCOSE, UA: NEGATIVE mg/dL
HGB URINE DIPSTICK: NEGATIVE
KETONES UR: NEGATIVE mg/dL
Nitrite: NEGATIVE
Protein, ur: NEGATIVE mg/dL
Specific Gravity, Urine: 1.025 (ref 1.005–1.030)
UROBILINOGEN UA: 1 mg/dL (ref 0.0–1.0)
pH: 6.5 (ref 5.0–8.0)

## 2014-05-18 LAB — URINALYSIS, MICROSCOPIC ONLY
Bacteria, UA: NONE SEEN
CASTS: NONE SEEN
Crystals: NONE SEEN

## 2014-05-19 DIAGNOSIS — N760 Acute vaginitis: Secondary | ICD-10-CM | POA: Insufficient documentation

## 2014-05-19 NOTE — Assessment & Plan Note (Signed)
Exam notes erythema of vulva without vaginal discharge or erythema in the vault.  Clobetasol cream bid x 1 week.

## 2014-05-20 LAB — URINE CULTURE

## 2014-06-24 ENCOUNTER — Other Ambulatory Visit: Payer: Self-pay | Admitting: Internal Medicine

## 2014-06-28 NOTE — Op Note (Signed)
  DATE OF BIRTH:  Nov 10, 1935  DATE OF PROCEDURE:  06/06/2012  PREOPERATIVE DIAGNOSES:   1.  Right lower quadrant pain.  2.  Ovarian cyst. 3.  Postmenopausal bleeding.   POSTOPERATIVE DIAGNOSES: 1.  Right lower quadrant pain.  2.  Ovarian cyst. 3.  Postmenopausal bleeding.   PROCEDURES:   1.  Hysteroscopy with Dprocedure.  2.  Laparoscopy with right salpingo-oophorectomy.   SURGEON:  Dr. Glean Salen  ANESTHESIA:  General.   BLOOD LOSS:  Minimal.   COMPLICATIONS:  None.   FINDINGS:  The patient has an enlarged right ovary, somewhat cystic in nature. The left ovary and uterus appeared normal, although uterus did have a slightly globular shape. Hysteroscopy revealed no intrauterine polyps or fibroids. A somewhat atrophic lining. Biopsies were performed through curettage.   DISPOSITION:  To recovery room in stable condition.   TECHNIQUE:  The patient is prepped and draped in the usual sterile fashion, after adequate anesthesia is obtained in the dorsal lithotomy position, Foley catheter is inserted. A tenaculum is placed on the cervix. Cervix is dilated with a small Hegar dilator, enough to pass a uterine sound through, with the uterus measuring to 7 cm. A tenaculum  is placed at this time.   Attention is then turned to the abdomen, where a Veress needle is inserted through a 5 mm infraumbilical incision after Marcaine is used to anesthetize the skin. Veress needle placement is confirmed using the hanging drop technique, and the abdomen is then insufflated with CO2 gas. A 5 mm trocar is then inserted under direct visualization with the laparoscope, with no injuries or bleeding noted. The patient is placed in Trendelenburg positioning.   The above-mentioned findings are visualized. A 5 mm trocar is placed in the left lower quadrant and an 11 mm trocar is placed in the right lower quadrant lateral to the inferior epigastric blood vessels with no injuries or bleeding noted. The right  ovary and fallopian tube were carefully dissected free from the infundibulopelvic ligament, and the uteroovarian blood vessels and ligaments for complete amputation of the tube and ovary, without bleeding noted from its pedicle site. There is no apparent injury or close dissection to ureter, bowel, or other structures. The ovary and tube are placed in an Endopouch and removed through the right lower quadrant incision without spillage and without difficulty. It was all sent to Pathology for further review. Examination of the pelvis reveals excellent hemostasis, with no other concerning findings. The left ovary is normal and is left in place. The gas is expelled, trocars are removed, and skin is closed with Dermabond.   Hysteroscopy is performed. The cervix is dilated to a size 18 Pratt dilator carefully, without perforation or complication. Endocervical curettage is performed first. Hysteroscope is then inserted with saline distention of the intrauterine cavity, with above-mentioned findings visualized. The hysteroscope is removed, and a banjo curette is used to perform an endometrial curettage, with specimen sent for further review. Hemostasis is noted, although there is some bleeding from the tenaculum site. This is clamped, and observed to be hemostatic at the conclusion of the case. The patient is cleansed of all blood and Betadine. The patient goes to the recovery room in stable condition. All sponge, instrument and needle counts are correct.    ____________________________ R. Barnett Applebaum, MD rph:mr D: 06/06/2012 15:44:53 ET T: 06/06/2012 20:22:18 ET JOB#: [id number]  cc: Glean Salen, MD, <Dictator> Gae Dry MD ELECTRONICALLY SIGNED 06/07/2012 7:10

## 2014-07-10 ENCOUNTER — Other Ambulatory Visit: Payer: Self-pay | Admitting: Internal Medicine

## 2014-07-25 ENCOUNTER — Other Ambulatory Visit: Payer: Self-pay | Admitting: Internal Medicine

## 2014-08-21 ENCOUNTER — Telehealth: Payer: Self-pay | Admitting: Internal Medicine

## 2014-08-21 DIAGNOSIS — E785 Hyperlipidemia, unspecified: Secondary | ICD-10-CM

## 2014-08-21 DIAGNOSIS — R4189 Other symptoms and signs involving cognitive functions and awareness: Secondary | ICD-10-CM

## 2014-08-21 DIAGNOSIS — I10 Essential (primary) hypertension: Secondary | ICD-10-CM

## 2014-08-21 NOTE — Telephone Encounter (Signed)
Pt called to schedule a lab appt. Please advise no currently lab orders/msn

## 2014-08-22 NOTE — Telephone Encounter (Signed)
Note from 1/15 stated return in 6 months for labs ordered TSH, CBC, Lipid  And CMET scheduled patient for  09/06/14 fasting ? Need anything else?

## 2014-08-22 NOTE — Telephone Encounter (Signed)
I added B12 and RPR  Thanks, please add th rest

## 2014-08-22 NOTE — Telephone Encounter (Signed)
Done

## 2014-09-06 ENCOUNTER — Other Ambulatory Visit (INDEPENDENT_AMBULATORY_CARE_PROVIDER_SITE_OTHER): Payer: Medicare Other

## 2014-09-06 DIAGNOSIS — E785 Hyperlipidemia, unspecified: Secondary | ICD-10-CM

## 2014-09-06 DIAGNOSIS — I10 Essential (primary) hypertension: Secondary | ICD-10-CM | POA: Diagnosis not present

## 2014-09-06 DIAGNOSIS — R4189 Other symptoms and signs involving cognitive functions and awareness: Secondary | ICD-10-CM

## 2014-09-06 LAB — TSH: TSH: 1.88 u[IU]/mL (ref 0.35–4.50)

## 2014-09-06 LAB — CBC WITH DIFFERENTIAL/PLATELET
Basophils Absolute: 0 10*3/uL (ref 0.0–0.1)
Basophils Relative: 0.5 % (ref 0.0–3.0)
Eosinophils Absolute: 0.2 10*3/uL (ref 0.0–0.7)
Eosinophils Relative: 3.1 % (ref 0.0–5.0)
HEMATOCRIT: 37.8 % (ref 36.0–46.0)
Hemoglobin: 12.7 g/dL (ref 12.0–15.0)
LYMPHS ABS: 1.3 10*3/uL (ref 0.7–4.0)
LYMPHS PCT: 21.3 % (ref 12.0–46.0)
MCHC: 33.7 g/dL (ref 30.0–36.0)
MCV: 86.9 fl (ref 78.0–100.0)
MONO ABS: 0.4 10*3/uL (ref 0.1–1.0)
MONOS PCT: 6.6 % (ref 3.0–12.0)
NEUTROS ABS: 4.1 10*3/uL (ref 1.4–7.7)
NEUTROS PCT: 68.5 % (ref 43.0–77.0)
PLATELETS: 180 10*3/uL (ref 150.0–400.0)
RBC: 4.35 Mil/uL (ref 3.87–5.11)
RDW: 15.4 % (ref 11.5–15.5)
WBC: 6 10*3/uL (ref 4.0–10.5)

## 2014-09-06 LAB — COMPREHENSIVE METABOLIC PANEL
ALK PHOS: 111 U/L (ref 39–117)
ALT: 17 U/L (ref 0–35)
AST: 18 U/L (ref 0–37)
Albumin: 4.1 g/dL (ref 3.5–5.2)
BUN: 26 mg/dL — ABNORMAL HIGH (ref 6–23)
CALCIUM: 9.4 mg/dL (ref 8.4–10.5)
CHLORIDE: 106 meq/L (ref 96–112)
CO2: 26 mEq/L (ref 19–32)
Creatinine, Ser: 1.1 mg/dL (ref 0.40–1.20)
GFR: 51.21 mL/min — ABNORMAL LOW (ref >60.00)
Glucose, Bld: 93 mg/dL (ref 70–99)
Potassium: 4.4 mEq/L (ref 3.5–5.1)
Sodium: 142 mEq/L (ref 135–145)
Total Bilirubin: 0.4 mg/dL (ref 0.2–1.2)
Total Protein: 6.5 g/dL (ref 6.0–8.3)

## 2014-09-06 LAB — LIPID PANEL
Cholesterol: 180 mg/dL (ref 0–200)
HDL: 74.7 mg/dL (ref >39.00)
LDL CALC: 87 mg/dL (ref 0–99)
NonHDL: 105.3
TRIGLYCERIDES: 91 mg/dL (ref 0.0–149.0)
Total CHOL/HDL Ratio: 2
VLDL: 18.2 mg/dL (ref 0.0–40.0)

## 2014-09-06 LAB — VITAMIN B12: VITAMIN B 12: 321 pg/mL (ref 211–911)

## 2014-09-07 LAB — RPR

## 2014-09-10 ENCOUNTER — Encounter: Payer: Self-pay | Admitting: *Deleted

## 2014-10-09 ENCOUNTER — Other Ambulatory Visit: Payer: Self-pay | Admitting: Internal Medicine

## 2014-10-15 ENCOUNTER — Telehealth: Payer: Self-pay | Admitting: Podiatry

## 2014-10-15 NOTE — Telephone Encounter (Signed)
Okay to add her to [facility] schedule for tomorrow at 2:45p

## 2014-10-15 NOTE — Telephone Encounter (Signed)
Pt called and would be a new pt and req to see Dr Milinda Pointer. I scheduled her for 8.17 but pt states she needs to be seen asap. She has a bunion and it is causing severe pain and is swollen. Her husband is a pt of Dr Milinda Pointer and has looked at her foot and told her she would need surgery. Pt asked if we could work her in tomorrow in [place].Also pt asked if a nurse could possible call her and give her some advise on what to do until appt for pain.

## 2014-10-16 ENCOUNTER — Ambulatory Visit (INDEPENDENT_AMBULATORY_CARE_PROVIDER_SITE_OTHER): Payer: Medicare Other | Admitting: Family Medicine

## 2014-10-16 ENCOUNTER — Encounter: Payer: Self-pay | Admitting: Family Medicine

## 2014-10-16 VITALS — BP 110/68 | HR 73 | Temp 98.2°F | Ht 64.0 in | Wt 186.2 lb

## 2014-10-16 DIAGNOSIS — M10071 Idiopathic gout, right ankle and foot: Secondary | ICD-10-CM | POA: Diagnosis not present

## 2014-10-16 DIAGNOSIS — M109 Gout, unspecified: Secondary | ICD-10-CM

## 2014-10-16 DIAGNOSIS — R21 Rash and other nonspecific skin eruption: Secondary | ICD-10-CM | POA: Insufficient documentation

## 2014-10-16 LAB — URIC ACID: Uric Acid, Serum: 6.3 mg/dL (ref 2.4–7.0)

## 2014-10-16 MED ORDER — PREDNISONE 10 MG PO TABS: ORAL_TABLET | ORAL | Status: DC

## 2014-10-16 MED ORDER — TRIAMCINOLONE ACETONIDE 0.1 % EX OINT: 1.0000 "application " | TOPICAL_OINTMENT | Freq: Two times a day (BID) | CUTANEOUS | Status: DC | PRN

## 2014-10-16 NOTE — Assessment & Plan Note (Signed)
Likey contact in origin. Patient being treated with Prednisone for gout (this should improve rash). Topical triamcinolone given to be used after prednisone course as needed.

## 2014-10-16 NOTE — Patient Instructions (Signed)
Stop the Mobic while you are on the prednisone.  Take the prednisone as prescribed (4 tablets 3 days then 3 tablets 3 days then 2 tablets 3 days then 1 tablet 3 days).  Follow up with Dr. Derrel Nip as indicated.  You can use the topical steroid after completion of your prednisone course if needed.  Take care  Dr. Lacinda Axon

## 2014-10-16 NOTE — Progress Notes (Signed)
Pre visit review using our clinic review tool, if applicable. No additional management support is needed unless otherwise documented below in the visit note. 

## 2014-10-16 NOTE — Progress Notes (Signed)
Subjective:  Patient ID: Jamie Anderson, female    DOB: 02-Apr-1935  Age: 79 y.o. MRN: [id number]  CC: Right great toe redness & pain  HPI  79 year old female with a PMH of Gout presents for an acute visit with complaints of Right great toe pain and redness.  Patient reports that yesterday she developed severe pain and associated redness of her right great toe. Pain was very severe and unrelenting. No known inciting factor. She states the pain was worse with motion/activity. She used ice as well as icy hot to help with the pain. She states she had mild improvement with this. No recent fevers or chills. No new medications. No recent fall, trauma, injury. She states that this feels similar to her prior gout attacks but worse.  Additionally, patient states she has had rash and itching of her thoracic spine near her bra line. She's been using topical cortisone little relief. No known inciting factor. No exacerbating factors. She reports associated itching. No new exposures or contacts.  Social Hx - Nonsmoker.   Review of Systems  Objective:  BP 110/68 mmHg  Pulse 73  Temp(Src) 98.2 F (36.8 C) (Oral)  Ht 5\' 4"  (1.626 m)  Wt 186 lb 4 oz (84.482 kg)  BMI 31.95 kg/m2  SpO2 97%  BP/Weight 10/16/2014 6/33/3545 08/07/5636  Systolic BP 937 342 876  Diastolic BP 68 78 78  Wt. (Lbs) 186.25 186 184.75  BMI 31.95 31.91 31.7    Physical Exam  Constitutional: She appears well-developed and well-nourished.  Cardiovascular: Normal rate and regular rhythm.   No murmur heard. Pulmonary/Chest: No respiratory distress. She has no wheezes. She has no rales.  Abdominal: Soft. She exhibits no distension. There is no tenderness. There is no rebound.  Musculoskeletal: She exhibits no edema.  R great toe and MTP - very tender to palpation. Slightly warm. Decreased range of motion secondary to pain. Bunion noted.  Skin: Skin is warm and dry.  Mild erythema overlying the first MTP joint and medial dorsum  of foot.  Back - scattered papular areas noted over the thoracic region.  Vitals reviewed.   Lab Results  Component Value Date   WBC 6.0 09/06/2014   HGB 12.7 09/06/2014   HCT 37.8 09/06/2014   PLT 180.0 09/06/2014   GLUCOSE 93 09/06/2014   CHOL 180 09/06/2014   TRIG 91.0 09/06/2014   HDL 74.70 09/06/2014   LDLCALC 87 09/06/2014   ALT 17 09/06/2014   AST 18 09/06/2014   NA 142 09/06/2014   K 4.4 09/06/2014   CL 106 09/06/2014   CREATININE 1.10 09/06/2014   BUN 26* 09/06/2014   CO2 26 09/06/2014   TSH 1.88 09/06/2014   INR 0.9 05/29/2012   MICROALBUR 2.8* 01/25/2013    Assessment & Plan:   Problem List Items Addressed This Visit    Gout - Primary    Patient's history and physical exam consistent with acute gout attack. Patient is compliant with allopurinol per her report. I advised her to continue this. I am treating her with a course of prednisone and advised her to discontinue the meloxicam while taking the prednisone (she is not sure why she takes daily meloxicam).  Checking uric acid today. Patient to follow-up with her PCP as indicated.      Relevant Medications   predniSONE (DELTASONE) 10 MG tablet   Other Relevant Orders   Uric acid   Rash    Likey contact in origin. Patient being treated with  Prednisone for gout (this should improve rash). Topical triamcinolone given to be used after prednisone course as needed.         Meds ordered this encounter  Medications  . predniSONE (DELTASONE) 10 MG tablet    Sig: 40 mg daily x 3 days, then 30 mg daily x 3 days, then 20 mg daily x 3 days, then 10 mg daily x 3 days.    Dispense:  30 tablet    Refill:  0  . triamcinolone ointment (KENALOG) 0.1 %    Sig: Apply 1 application topically 2 (two) times daily as needed.    Dispense:  30 g    Refill:  0     Follow-up: PRN/As scheduled with PCP.    Thersa Salt, DO

## 2014-10-16 NOTE — Assessment & Plan Note (Signed)
Patient's history and physical exam consistent with acute gout attack. Patient is compliant with allopurinol per her report. I advised her to continue this. I am treating her with a course of prednisone and advised her to discontinue the meloxicam while taking the prednisone (she is not sure why she takes daily meloxicam).  Checking uric acid today. Patient to follow-up with her PCP as indicated.

## 2014-10-17 ENCOUNTER — Telehealth: Payer: Self-pay

## 2014-10-17 DIAGNOSIS — M109 Gout, unspecified: Secondary | ICD-10-CM

## 2014-10-17 MED ORDER — PREDNISONE 10 MG PO TABS: ORAL_TABLET | ORAL | Status: DC

## 2014-10-17 MED ORDER — TRIAMCINOLONE ACETONIDE 0.1 % EX OINT: 1.0000 "application " | TOPICAL_OINTMENT | Freq: Two times a day (BID) | CUTANEOUS | Status: DC | PRN

## 2014-10-17 NOTE — Telephone Encounter (Signed)
Triamcinolone ointment was sent to [facility]

## 2014-10-17 NOTE — Telephone Encounter (Addendum)
Sent this medication to new pharmacy that she wanted.  The medications were canceled at [facility]

## 2014-10-17 NOTE — Telephone Encounter (Signed)
The pt called asking that you send the prednisone to [facility] because it will take [facility] a week to get that medication to her. i have changed that pharmacy and reordered it.

## 2014-10-18 ENCOUNTER — Other Ambulatory Visit: Payer: Self-pay | Admitting: Internal Medicine

## 2014-10-21 DIAGNOSIS — S0501XA Injury of conjunctiva and corneal abrasion without foreign body, right eye, initial encounter: Secondary | ICD-10-CM | POA: Diagnosis not present

## 2014-10-23 ENCOUNTER — Ambulatory Visit: Payer: Medicare Other | Admitting: Podiatry

## 2014-11-25 DIAGNOSIS — L920 Granuloma annulare: Secondary | ICD-10-CM | POA: Diagnosis not present

## 2014-11-25 DIAGNOSIS — D1800 Hemangioma unspecified site: Secondary | ICD-10-CM | POA: Diagnosis not present

## 2014-11-25 DIAGNOSIS — L570 Actinic keratosis: Secondary | ICD-10-CM | POA: Diagnosis not present

## 2014-11-25 DIAGNOSIS — L821 Other seborrheic keratosis: Secondary | ICD-10-CM | POA: Diagnosis not present

## 2014-11-25 DIAGNOSIS — L820 Inflamed seborrheic keratosis: Secondary | ICD-10-CM | POA: Diagnosis not present

## 2014-11-25 DIAGNOSIS — I8393 Asymptomatic varicose veins of bilateral lower extremities: Secondary | ICD-10-CM | POA: Diagnosis not present

## 2014-11-28 ENCOUNTER — Other Ambulatory Visit: Payer: Self-pay | Admitting: Internal Medicine

## 2014-12-18 ENCOUNTER — Other Ambulatory Visit: Payer: Self-pay | Admitting: Internal Medicine

## 2014-12-24 ENCOUNTER — Encounter: Payer: Self-pay | Admitting: Family Medicine

## 2014-12-24 ENCOUNTER — Ambulatory Visit (INDEPENDENT_AMBULATORY_CARE_PROVIDER_SITE_OTHER): Payer: Medicare Other | Admitting: Family Medicine

## 2014-12-24 VITALS — BP 120/70 | HR 85 | Temp 97.5°F | Ht 64.0 in | Wt 188.4 lb

## 2014-12-24 DIAGNOSIS — M722 Plantar fascial fibromatosis: Secondary | ICD-10-CM

## 2014-12-24 HISTORY — DX: Plantar fascial fibromatosis: M72.2

## 2014-12-24 NOTE — Progress Notes (Signed)
   Subjective:  Patient ID: Jamie Anderson, female    DOB: 10/30/1935  Age: 79 y.o. MRN: [id number]  CC: Right heel pain  HPI:  79 year old female presents to clinic today with complaints of right heel pain.  Right heel pain  Patient reports a one-week history of right heel pain.  She reports that is worse in the morning when she first steps out of bed.  No recent fall, trauma or injury.  She's been taking NSAIDs with some relief.  She's also been resting a little more some relief.  Exacerbated by physical activity.  No other complaints. No associated symptoms.  Social Hx   Social History   Social History  . Marital Status: Married    Spouse Name: N/A  . Number of Children: N/A  . Years of Education: N/A   Social History Main Topics  . Smoking status: Never Smoker   . Smokeless tobacco: None  . Alcohol Use: 2.4 oz/week    4 Glasses of wine per week  . Drug Use: None  . Sexual Activity: Not Currently   Other Topics Concern  . None   Social History Narrative   Review of Systems  Constitutional: Negative.   Musculoskeletal:       Heel pain - right.    Objective:  BP 120/70 mmHg  Pulse 85  Temp(Src) 97.5 F (36.4 C) (Oral)  Ht 5\' 4"  (1.626 m)  Wt 188 lb 6 oz (85.446 kg)  BMI 32.32 kg/m2  SpO2 97%  BP/Weight 12/24/2014 10/16/2014 0/25/4270  Systolic BP 623 762 831  Diastolic BP 70 68 78  Wt. (Lbs) 188.38 186.25 186  BMI 32.32 31.95 31.91   Physical Exam  Constitutional: She appears well-developed and well-nourished.  Musculoskeletal:  Right foot - heel with tenderness at medial heel (plantar aspect), where plantar fascia attaches.   Neurological: She is alert.  Psychiatric: She has a normal mood and affect.  Vitals reviewed.   Lab Results  Component Value Date   WBC 6.0 09/06/2014   HGB 12.7 09/06/2014   HCT 37.8 09/06/2014   PLT 180.0 09/06/2014   GLUCOSE 93 09/06/2014   CHOL 180 09/06/2014   TRIG 91.0 09/06/2014   HDL 74.70 09/06/2014     LDLCALC 87 09/06/2014   ALT 17 09/06/2014   AST 18 09/06/2014   NA 142 09/06/2014   K 4.4 09/06/2014   CL 106 09/06/2014   CREATININE 1.10 09/06/2014   BUN 26* 09/06/2014   CO2 26 09/06/2014   TSH 1.88 09/06/2014   INR 0.9 05/29/2012   MICROALBUR 2.8* 01/25/2013    Assessment & Plan:   Problem List Items Addressed This Visit    Plantar fasciitis of right foot - Primary    Advised NSAID's, heel cups/supports, stretching. Follow up as needed.           Follow-up: PRN  Thersa Salt, DO

## 2014-12-24 NOTE — Progress Notes (Signed)
Pre visit review using our clinic review tool, if applicable. No additional management support is needed unless otherwise documented below in the visit note. 

## 2014-12-24 NOTE — Assessment & Plan Note (Signed)
Advised NSAID's, heel cups/supports, stretching. Follow up as needed.

## 2014-12-24 NOTE — Patient Instructions (Signed)
Use Ibuprofen as needed (sparingly).  Be sure to wear good comfortable shoes.  Be sure to stretch the area (see below).  Plantar Fasciitis Plantar fasciitis is a painful foot condition that affects the heel. It occurs when the band of tissue that connects the toes to the heel bone (plantar fascia) becomes irritated. This can happen after exercising too much or doing other repetitive activities (overuse injury). The pain from plantar fasciitis can range from mild irritation to severe pain that makes it difficult for you to walk or move. The pain is usually worse in the morning or after you have been sitting or lying down for a while. CAUSES This condition may be caused by:  Standing for long periods of time.  Wearing shoes that do not fit.  Doing high-impact activities, including running, aerobics, and ballet.  Being overweight.  Having an abnormal way of walking (gait).  Having tight calf muscles.  Having high arches in your feet.  Starting a new athletic activity. SYMPTOMS The main symptom of this condition is heel pain. Other symptoms include:  Pain that gets worse after activity or exercise.  Pain that is worse in the morning or after resting.  Pain that goes away after you walk for a few minutes. DIAGNOSIS This condition may be diagnosed based on your signs and symptoms. Your health care provider will also do a physical exam to check for:  A tender area on the bottom of your foot.  A high arch in your foot.  Pain when you move your foot.  Difficulty moving your foot. You may also need to have imaging studies to confirm the diagnosis. These can include:  X-rays.  Ultrasound.  MRI. TREATMENT  Treatment for plantar fasciitis depends on the severity of the condition. Your treatment may include:  Rest, ice, and over-the-counter pain medicines to manage your pain.  Exercises to stretch your calves and your plantar fascia.  A splint that holds your foot in a  stretched, upward position while you sleep (night splint).  Physical therapy to relieve symptoms and prevent problems in the future.  Cortisone injections to relieve severe pain.  Extracorporeal shock wave therapy (ESWT) to stimulate damaged plantar fascia with electrical impulses. It is often used as a last resort before surgery.  Surgery, if other treatments have not worked after 12 months. HOME CARE INSTRUCTIONS  Take medicines only as directed by your health care provider.  Avoid activities that cause pain.  Roll the bottom of your foot over a bag of ice or a bottle of cold water. Do this for 20 minutes, 3-4 times a day.  Perform simple stretches as directed by your health care provider.  Try wearing athletic shoes with air-sole or gel-sole cushions or soft shoe inserts.  Wear a night splint while sleeping, if directed by your health care provider.  Keep all follow-up appointments with your health care provider. PREVENTION   Do not perform exercises or activities that cause heel pain.  Consider finding low-impact activities if you continue to have problems.  Lose weight if you need to. The best way to prevent plantar fasciitis is to avoid the activities that aggravate your plantar fascia. SEEK MEDICAL CARE IF:  Your symptoms do not go away after treatment with home care measures.  Your pain gets worse.  Your pain affects your ability to move or do your daily activities.   This information is not intended to replace advice given to you by your health care provider. Make sure  you discuss any questions you have with your health care provider.   Document Released: 11/17/2000 Document Revised: 11/13/2014 Document Reviewed: 01/02/2014 Elsevier Interactive Patient Education [employer].

## 2015-01-01 ENCOUNTER — Other Ambulatory Visit: Payer: Self-pay | Admitting: Internal Medicine

## 2015-01-15 ENCOUNTER — Ambulatory Visit (INDEPENDENT_AMBULATORY_CARE_PROVIDER_SITE_OTHER): Payer: Medicare Other | Admitting: Internal Medicine

## 2015-01-15 ENCOUNTER — Telehealth: Payer: Self-pay | Admitting: *Deleted

## 2015-01-15 ENCOUNTER — Encounter: Payer: Self-pay | Admitting: Podiatry

## 2015-01-15 ENCOUNTER — Ambulatory Visit (INDEPENDENT_AMBULATORY_CARE_PROVIDER_SITE_OTHER): Payer: Medicare Other

## 2015-01-15 ENCOUNTER — Encounter: Payer: Self-pay | Admitting: Internal Medicine

## 2015-01-15 ENCOUNTER — Ambulatory Visit (INDEPENDENT_AMBULATORY_CARE_PROVIDER_SITE_OTHER): Payer: Medicare Other | Admitting: Podiatry

## 2015-01-15 VITALS — BP 144/69 | HR 77 | Resp 16

## 2015-01-15 VITALS — BP 135/60 | HR 79 | Temp 98.0°F | Wt 188.0 lb

## 2015-01-15 DIAGNOSIS — E669 Obesity, unspecified: Secondary | ICD-10-CM | POA: Diagnosis not present

## 2015-01-15 DIAGNOSIS — I10 Essential (primary) hypertension: Secondary | ICD-10-CM | POA: Diagnosis not present

## 2015-01-15 DIAGNOSIS — Z13820 Encounter for screening for osteoporosis: Secondary | ICD-10-CM

## 2015-01-15 DIAGNOSIS — M722 Plantar fascial fibromatosis: Secondary | ICD-10-CM

## 2015-01-15 DIAGNOSIS — R5383 Other fatigue: Secondary | ICD-10-CM | POA: Diagnosis not present

## 2015-01-15 DIAGNOSIS — R739 Hyperglycemia, unspecified: Secondary | ICD-10-CM

## 2015-01-15 DIAGNOSIS — Z1211 Encounter for screening for malignant neoplasm of colon: Secondary | ICD-10-CM

## 2015-01-15 DIAGNOSIS — Z113 Encounter for screening for infections with a predominantly sexual mode of transmission: Secondary | ICD-10-CM | POA: Diagnosis not present

## 2015-01-15 DIAGNOSIS — R200 Anesthesia of skin: Secondary | ICD-10-CM

## 2015-01-15 DIAGNOSIS — F320 Major depressive disorder, single episode, mild: Secondary | ICD-10-CM

## 2015-01-15 DIAGNOSIS — E559 Vitamin D deficiency, unspecified: Secondary | ICD-10-CM

## 2015-01-15 DIAGNOSIS — E785 Hyperlipidemia, unspecified: Secondary | ICD-10-CM | POA: Diagnosis not present

## 2015-01-15 DIAGNOSIS — Z0000 Encounter for general adult medical examination without abnormal findings: Secondary | ICD-10-CM

## 2015-01-15 DIAGNOSIS — Z1239 Encounter for other screening for malignant neoplasm of breast: Secondary | ICD-10-CM

## 2015-01-15 DIAGNOSIS — R202 Paresthesia of skin: Secondary | ICD-10-CM

## 2015-01-15 DIAGNOSIS — F429 Obsessive-compulsive disorder, unspecified: Secondary | ICD-10-CM

## 2015-01-15 LAB — VITAMIN D 25 HYDROXY (VIT D DEFICIENCY, FRACTURES): VITD: 20.54 ng/mL — ABNORMAL LOW (ref 30.00–100.00)

## 2015-01-15 LAB — LIPID PANEL
CHOLESTEROL: 188 mg/dL (ref 0–200)
HDL: 74.9 mg/dL (ref >39.00)
LDL Cholesterol: 100 mg/dL — ABNORMAL HIGH (ref 0–99)
NONHDL: 113.59
TRIGLYCERIDES: 70 mg/dL (ref 0.0–149.0)
Total CHOL/HDL Ratio: 3
VLDL: 14 mg/dL (ref 0.0–40.0)

## 2015-01-15 LAB — CBC WITH DIFFERENTIAL/PLATELET
BASOS ABS: 0 10*3/uL (ref 0.0–0.1)
Basophils Relative: 0.5 % (ref 0.0–3.0)
EOS PCT: 1.7 % (ref 0.0–5.0)
Eosinophils Absolute: 0.1 10*3/uL (ref 0.0–0.7)
HEMATOCRIT: 37.3 % (ref 36.0–46.0)
HEMOGLOBIN: 12.5 g/dL (ref 12.0–15.0)
LYMPHS ABS: 1.4 10*3/uL (ref 0.7–4.0)
LYMPHS PCT: 19.4 % (ref 12.0–46.0)
MCHC: 33.5 g/dL (ref 30.0–36.0)
MCV: 87.6 fl (ref 78.0–100.0)
MONOS PCT: 6.1 % (ref 3.0–12.0)
Monocytes Absolute: 0.4 10*3/uL (ref 0.1–1.0)
Neutro Abs: 5.3 10*3/uL (ref 1.4–7.7)
Neutrophils Relative %: 72.3 % (ref 43.0–77.0)
Platelets: 189 10*3/uL (ref 150.0–400.0)
RBC: 4.26 Mil/uL (ref 3.87–5.11)
RDW: 15.4 % (ref 11.5–15.5)
WBC: 7.3 10*3/uL (ref 4.0–10.5)

## 2015-01-15 LAB — COMPREHENSIVE METABOLIC PANEL
ALT: 14 U/L (ref 0–35)
AST: 16 U/L (ref 0–37)
Albumin: 4.3 g/dL (ref 3.5–5.2)
Alkaline Phosphatase: 119 U/L — ABNORMAL HIGH (ref 39–117)
BILIRUBIN TOTAL: 0.5 mg/dL (ref 0.2–1.2)
BUN: 22 mg/dL (ref 6–23)
CHLORIDE: 105 meq/L (ref 96–112)
CO2: 28 meq/L (ref 19–32)
Calcium: 10 mg/dL (ref 8.4–10.5)
Creatinine, Ser: 0.97 mg/dL (ref 0.40–1.20)
GFR: 59.15 mL/min — AB (ref >60.00)
GLUCOSE: 95 mg/dL (ref 70–99)
Potassium: 4.1 mEq/L (ref 3.5–5.1)
Sodium: 142 mEq/L (ref 135–145)
Total Protein: 6.6 g/dL (ref 6.0–8.3)

## 2015-01-15 LAB — TSH: TSH: 1.38 u[IU]/mL (ref 0.35–4.50)

## 2015-01-15 LAB — HEMOGLOBIN A1C: Hgb A1c MFr Bld: 5.4 % (ref 4.6–6.5)

## 2015-01-15 LAB — LDL CHOLESTEROL, DIRECT: Direct LDL: 93 mg/dL

## 2015-01-15 MED ORDER — MELOXICAM 15 MG PO TABS: 15.0000 mg | ORAL_TABLET | Freq: Every day | ORAL | Status: DC

## 2015-01-15 MED ORDER — MELOXICAM 15 MG PO TABS
15.0000 mg | ORAL_TABLET | Freq: Every day | ORAL | Status: DC
Start: 2015-01-15 — End: 2015-01-15

## 2015-01-15 MED ORDER — METHYLPREDNISOLONE 4 MG PO TBPK: ORAL_TABLET | ORAL | Status: DC

## 2015-01-15 NOTE — Progress Notes (Signed)
Pre visit review using our clinic review tool, if applicable. No additional management support is needed unless otherwise documented below in the visit note. 

## 2015-01-15 NOTE — Progress Notes (Signed)
   Subjective:    Patient ID: Jamie Anderson, female    DOB: July 04, 1935, 79 y.o.   MRN: [id number]  HPI: She presents today with her daughter with a chief complaint painful right heel. She states it has been going on for several weeks but over the last couple of days has developed to the point that she can hardly put her heel down. Her primary care provider suggested ice and ibuprofen. She states that she is walking with a cane now and needs a walker in the house.    Review of Systems  Gastrointestinal: Positive for diarrhea.  Musculoskeletal: Positive for gait problem.  Allergic/Immunologic: Positive for food allergies.  All other systems reviewed and are negative.      Objective:   Physical Exam: 79 year old white female presents today cane to the right hand with an antalgic gait right. Vital signs stable blood pressure slightly elevated she is alert and oriented 3. Pulses are strongly palpable. Neurologic sensorium is intact per Semmes-Weinstein monofilament. Deep tendon reflexes are intact bilateral and muscle strength +5 over 5 dorsiflexors plantar flexors and inverters everters all intrinsic musculature is intact. Orthopedic evaluation demonstrates all joints distal to the ankle have a full range of motion without crepitation. Cutaneous evaluation demonstrates supple well-hydrated cutis no erythema edema cellulitis drainage or odor. Orthopedic evaluation does demonstrate pain on palpation medial calcaneal tubercle of the right heel. Radiographs also demonstrate a soft tissue increase in density at the plantar fascial calcaneal insertion site indicative of plantar fasciitis. I see no fractures of the calcaneus.        Assessment & Plan:  Assessment: Acute plantar fasciitis right foot.  Plan: Discussed etiology pathology conservative versus surgical therapies. Injected the right heel today with Kenalog and local anesthetic. Placed her on a Medrol Dosepak to be followed by meloxicam.  Dispensed plantar fascial braces and a single night splint. Assess appropriate shoe gear stretching exercises ice therapy and shoe gear modifications. I will follow-up with her in 1 month.  Roselind Messier DPM

## 2015-01-15 NOTE — Patient Instructions (Signed)

## 2015-01-15 NOTE — Patient Instructions (Signed)
You can continue ibuprofen at 600 mg three times daily and add tylenol 650 mg to your ibuprofen dose  We will get your mammogram and [facility]  Screening ordered  Menopause is a normal process in which your reproductive ability comes to an end. This process happens gradually over a span of months to years, usually between the ages of 61 and 10. Menopause is complete when you have missed 12 consecutive menstrual periods. It is important to talk with your health care provider about some of the most common conditions that affect postmenopausal women, such as heart disease, cancer, and bone loss (osteoporosis). Adopting a healthy lifestyle and getting preventive care can help to promote your health and wellness. Those actions can also lower your chances of developing some of these common conditions. WHAT SHOULD I KNOW ABOUT MENOPAUSE? During menopause, you may experience a number of symptoms, such as:  Moderate-to-severe hot flashes.  Night sweats.  Decrease in sex drive.  Mood swings.  Headaches.  Tiredness.  Irritability.  Memory problems.  Insomnia. Choosing to treat or not to treat menopausal changes is an individual decision that you make with your health care provider. WHAT SHOULD I KNOW ABOUT HORMONE REPLACEMENT THERAPY AND SUPPLEMENTS? Hormone therapy products are effective for treating symptoms that are associated with menopause, such as hot flashes and night sweats. Hormone replacement carries certain risks, especially as you become older. If you are thinking about using estrogen or estrogen with progestin treatments, discuss the benefits and risks with your health care provider. WHAT SHOULD I KNOW ABOUT HEART DISEASE AND STROKE? Heart disease, heart attack, and stroke become more likely as you age. This may be due, in part, to the hormonal changes that your body experiences during menopause. These can affect how your body processes dietary fats, triglycerides, and cholesterol.  Heart attack and stroke are both medical emergencies. There are many things that you can do to help prevent heart disease and stroke:  Have your blood pressure checked at least every 1-2 years. High blood pressure causes heart disease and increases the risk of stroke.  If you are 59-26 years old, ask your health care provider if you should take aspirin to prevent a heart attack or a stroke.  Do not use any tobacco products, including cigarettes, chewing tobacco, or electronic cigarettes. If you need help quitting, ask your health care provider.  It is important to eat a healthy diet and maintain a healthy weight.  Be sure to include plenty of vegetables, fruits, low-fat dairy products, and lean protein.  Avoid eating foods that are high in solid fats, added sugars, or salt (sodium).  Get regular exercise. This is one of the most important things that you can do for your health.  Try to exercise for at least 150 minutes each week. The type of exercise that you do should increase your heart rate and make you sweat. This is known as moderate-intensity exercise.  Try to do strengthening exercises at least twice each week. Do these in addition to the moderate-intensity exercise.  Know your numbers.Ask your health care provider to check your cholesterol and your blood glucose. Continue to have your blood tested as directed by your health care provider. WHAT SHOULD I KNOW ABOUT CANCER SCREENING? There are several types of cancer. Take the following steps to reduce your risk and to catch any cancer development as early as possible. Breast Cancer  Practice breast self-awareness.  This means understanding how your breasts normally appear and feel.  It  also means doing regular breast self-exams. Let your health care provider know about any changes, no matter how small.  If you are 44 or older, have a clinician do a breast exam (clinical breast exam or CBE) every year. Depending on your age,  family history, and medical history, it may be recommended that you also have a yearly breast X-ray (mammogram).  If you have a family history of breast cancer, talk with your health care provider about genetic screening.  If you are at high risk for breast cancer, talk with your health care provider about having an MRI and a mammogram every year.  Breast cancer (BRCA) gene test is recommended for women who have family members with BRCA-related cancers. Results of the assessment will determine the need for genetic counseling and BRCA1 and for BRCA2 testing. BRCA-related cancers include these types:  Breast. This occurs in males or females.  Ovarian.  Tubal. This may also be called fallopian tube cancer.  Cancer of the abdominal or pelvic lining (peritoneal cancer).  Prostate.  Pancreatic. Cervical, Uterine, and Ovarian Cancer Your health care provider may recommend that you be screened regularly for cancer of the pelvic organs. These include your ovaries, uterus, and vagina. This screening involves a pelvic exam, which includes checking for microscopic changes to the surface of your cervix (Pap test).  For women ages 21-65, health care providers may recommend a pelvic exam and a Pap test every three years. For women ages 67-65, they may recommend the Pap test and pelvic exam, combined with testing for human papilloma virus (HPV), every five years. Some types of HPV increase your risk of cervical cancer. Testing for HPV may also be done on women of any age who have unclear Pap test results.  Other health care providers may not recommend any screening for nonpregnant women who are considered low risk for pelvic cancer and have no symptoms. Ask your health care provider if a screening pelvic exam is right for you.  If you have had past treatment for cervical cancer or a condition that could lead to cancer, you need Pap tests and screening for cancer for at least 20 years after your treatment.  If Pap tests have been discontinued for you, your risk factors (such as having a new sexual partner) need to be reassessed to determine if you should start having screenings again. Some women have medical problems that increase the chance of getting cervical cancer. In these cases, your health care provider may recommend that you have screening and Pap tests more often.  If you have a family history of uterine cancer or ovarian cancer, talk with your health care provider about genetic screening.  If you have vaginal bleeding after reaching menopause, tell your health care provider.  There are currently no reliable tests available to screen for ovarian cancer. Lung Cancer Lung cancer screening is recommended for adults 90-20 years old who are at high risk for lung cancer because of a history of smoking. A yearly low-dose CT scan of the lungs is recommended if you:  Currently smoke.  Have a history of at least 30 pack-years of smoking and you currently smoke or have quit within the past 15 years. A pack-year is smoking an average of one pack of cigarettes per day for one year. Yearly screening should:  Continue until it has been 15 years since you quit.  Stop if you develop a health problem that would prevent you from having lung cancer treatment. Colorectal Cancer  This type of cancer can be detected and can often be prevented.  Routine colorectal cancer screening usually begins at age 58 and continues through age 31.  If you have risk factors for colon cancer, your health care provider may recommend that you be screened at an earlier age.  If you have a family history of colorectal cancer, talk with your health care provider about genetic screening.  Your health care provider may also recommend using home test kits to check for hidden blood in your stool.  A small camera at the end of a tube can be used to examine your colon directly (sigmoidoscopy or colonoscopy). This is done to check  for the earliest forms of colorectal cancer.  Direct examination of the colon should be repeated every 5-10 years until age 35. However, if early forms of precancerous polyps or small growths are found or if you have a family history or genetic risk for colorectal cancer, you may need to be screened more often. Skin Cancer  Check your skin from head to toe regularly.  Monitor any moles. Be sure to tell your health care provider:  About any new moles or changes in moles, especially if there is a change in a mole's shape or color.  If you have a mole that is larger than the size of a pencil eraser.  If any of your family members has a history of skin cancer, especially at a young age, talk with your health care provider about genetic screening.  Always use sunscreen. Apply sunscreen liberally and repeatedly throughout the day.  Whenever you are outside, protect yourself by wearing long sleeves, pants, a wide-brimmed hat, and sunglasses. WHAT SHOULD I KNOW ABOUT OSTEOPOROSIS? Osteoporosis is a condition in which bone destruction happens more quickly than new bone creation. After menopause, you may be at an increased risk for osteoporosis. To help prevent osteoporosis or the bone fractures that can happen because of osteoporosis, the following is recommended:  If you are 23-71 years old, get at least 1,000 mg of calcium and at least 600 mg of vitamin D per day.  If you are older than age 78 but younger than age 66, get at least 1,200 mg of calcium and at least 600 mg of vitamin D per day.  If you are older than age 68, get at least 1,200 mg of calcium and at least 800 mg of vitamin D per day. Smoking and excessive alcohol intake increase the risk of osteoporosis. Eat foods that are rich in calcium and vitamin D, and do weight-bearing exercises several times each week as directed by your health care provider. WHAT SHOULD I KNOW ABOUT HOW MENOPAUSE AFFECTS [place]? Depression may occur  at any age, but it is more common as you become older. Common symptoms of depression include:  Low or sad mood.  Changes in sleep patterns.  Changes in appetite or eating patterns.  Feeling an overall lack of motivation or enjoyment of activities that you previously enjoyed.  Frequent crying spells. Talk with your health care provider if you think that you are experiencing depression. WHAT SHOULD I KNOW ABOUT IMMUNIZATIONS? It is important that you get and maintain your immunizations. These include:  Tetanus, diphtheria, and pertussis (Tdap) booster vaccine.  Influenza every year before the flu season begins.  Pneumonia vaccine.  Shingles vaccine. Your health care provider may also recommend other immunizations.   This information is not intended to replace advice given to you by your health care  provider. Make sure you discuss any questions you have with your health care provider.   Document Released: 04/16/2005 Document Revised: 03/15/2014 Document Reviewed: 10/25/2013 Elsevier Interactive Patient Education [employer].

## 2015-01-15 NOTE — Progress Notes (Signed)
Patient ID: Jamie Anderson, female    DOB: May 20, 1935  Age: 79 y.o. MRN: [id number]  The patient is here for annual Medicare wellness examination and management of other chronic and acute problems.   The risk factors are reflected in the social history.  Overdue for colonoscopy,  FOBT was positive in 2014  But husband became ill .  Would rather do the FOBT again .cologuard discussed  mammogram normal  Jan 17 2014 [facility]  DEXA normal 2013.  Being treated by Brendolyn Patty for pruritic skin condition  No records available.  persistent itching  Without rash of the skin on her left lateral thigh and hip     The roster of all physicians providing medical care to patient - is listed in the Snapshot section of the chart.  Activities of daily living:  The patient is 100% independent in all ADLs: dressing, toileting, feeding as well as independent mobility  Home safety : The patient has smoke detectors in the home. They wear seatbelts.  There are no firearms at home. There is no violence in the home.   There is no risks for hepatitis, STDs or HIV. There is no   history of blood transfusion. They have no travel history to infectious disease endemic areas of the world.  The patient has seen their dentist in the last six month. They have seen their eye doctor in the last year. They admit to slight hearing difficulty with regard to whispered voices and some television programs.  They have deferred audiologic testing in the last year.  They do not  have excessive sun exposure. Discussed the need for sun protection: hats, long sleeves and use of sunscreen if there is significant sun exposure.   Diet: the importance of a healthy diet is discussed. They do have a healthy diet.  The benefits of regular aerobic exercise were discussed. She walks 4 times per week ,  20 minutes.   Depression screen: there are no signs or vegative symptoms of depression- irritability, change in appetite, anhedonia,  sadness/tearfullness.  Cognitive assessment: the patient manages all their financial and personal affairs and is actively engaged. They could relate day,date,year and events; recalled 2/3 objects at 3 minutes; performed clock-face test normally.  The following portions of the patient's history were reviewed and updated as appropriate: allergies, current medications, past family history, past medical history,  past surgical history, past social history  and problem list.  Visual acuity was not assessed per patient preference since she has regular follow up with her ophthalmologist. Hearing and body mass index were assessed and reviewed.   During the course of the visit the patient was educated and counseled about appropriate screening and preventive services including : fall prevention , diabetes screening, nutrition counseling, colorectal cancer screening, and recommended immunizations.    CC: The primary encounter diagnosis was Breast cancer screening. Diagnoses of Hyperlipidemia LDL goal <100, Other fatigue, Vitamin D deficiency, Essential hypertension, Screen for STD (sexually transmitted disease), Screening for osteoporosis, Hyperglycemia, Colon cancer screening, Medicare annual wellness visit, subsequent, Hyperlipidemia LDL goal <130, Plantar fasciitis of right foot, Major depressive disorder, single episode, mild ([place]), Obsessive compulsive disorder, Numbness and tingling of right arm, and Obesity were also pertinent to this visit.  Recurrent  Heel pain, right foot.  Was treated for plantar fasciitis  On Oct 18th a with ice,  Stretching and  Ibuprofen 400 mg every 4 hours for a week.  Foot started to feel better last week.  Was  walking  fine on it 3 days ago,  But 2 days ago started becoming really painful and now ca't walk on it, has appt with [place] today .  Has not seen [facility] for this i n is worse when she puts weight on foot.  Doesn't feel like gout   Right arm becomes painful and tingles  At  times .  No numbness, no loss of strength . Has arthritis in her neck   History [name] has a past medical history of Hyperlipidemia; Hypertension; Menopause; and Granuloma annulare.   She has past surgical history that includes Spine surgery (2010); Appendectomy; Venous ablation; Dilation and curettage of uterus; and Right oophorectomy (Right, feb 2014).   Her family history includes Early death in her mother; Heart disease in her father and sister. There is no history of Cancer.She reports that she has never smoked. She does not have any smokeless tobacco history on file. She reports that she drinks about 2.4 oz of alcohol per week. Her drug history is not on file.  Outpatient Prescriptions Prior to Visit  Medication Sig Dispense Refill  . allopurinol (ZYLOPRIM) 100 MG tablet TAKE 1 TABLET EVERY DAY 90 tablet 1  . clobetasol cream (TEMOVATE) 2.70 % Apply 1 application topically 2 (two) times daily. FOR ONE WEEK 30 g 2  . fluticasone (FLONASE) 50 MCG/ACT nasal spray USE 2 SPRAYS IN EACH NOSTRIL EVERY DAY 48 g 3  . losartan-hydrochlorothiazide (HYZAAR) 50-12.5 MG per tablet TAKE 2 TABLETS EVERY DAY 180 tablet 1  . omeprazole (PRILOSEC) 20 MG capsule Take 20 mg by mouth daily.    . pantoprazole (PROTONIX) 40 MG tablet TAKE 1 TABLET EVERY DAY 90 tablet 3  . sertraline (ZOLOFT) 100 MG tablet TAKE 1 TABLET EVERY DAY  (REPLACES  CITALOPRAM) 90 tablet 3  . simvastatin (ZOCOR) 40 MG tablet TAKE 1 TABLET AT BEDTIME 90 tablet 1  . triamcinolone ointment (KENALOG) 0.1 % Apply 1 application topically 2 (two) times daily as needed. 30 g 0  . baclofen (LIORESAL) 10 MG tablet Take 1 tablet (10 mg total) by mouth 2 (two) times daily as needed for muscle spasms. 30 each 0  . diazepam (VALIUM) 5 MG tablet Take 1 tablet (5 mg total) by mouth every 12 (twelve) hours as needed. For muscle spasm 60 tablet 1  . dicyclomine (BENTYL) 10 MG capsule Take 10 mg by mouth 4 (four) times daily -  before meals and at bedtime.     . fluconazole (DIFLUCAN) 150 MG tablet Take 1 tablet (150 mg total) by mouth daily. 2 tablet 0  . meloxicam (MOBIC) 15 MG tablet TAKE 1 TABLET EVERY DAY 90 tablet 1  . traMADol (ULTRAM) 50 MG tablet Take 1 tablet (50 mg total) by mouth every 8 (eight) hours as needed. 30 tablet 0  . amoxicillin-clavulanate (AUGMENTIN) 875-125 MG per tablet Take 1 tablet by mouth 2 (two) times daily. (Patient not taking: Reported on 01/15/2015) 14 tablet 0  . predniSONE (DELTASONE) 10 MG tablet 40 mg daily x 3 days, then 30 mg daily x 3 days, then 20 mg daily x 3 days, then 10 mg daily x 3 days. (Patient not taking: Reported on 01/15/2015) 30 tablet 0   No facility-administered medications prior to visit.    Review of Systems   Patient denies headache, fevers, malaise, unintentional weight loss, skin rash, eye pain, sinus congestion and sinus pain, sore throat, dysphagia,  hemoptysis , cough, dyspnea, wheezing, chest pain, palpitations, orthopnea, edema, abdominal pain,  nausea, melena, diarrhea, constipation, flank pain, dysuria, hematuria, urinary  Frequency, nocturia, numbness,  seizures,  Focal weakness, Loss of consciousness,  Tremor, insomnia, depression, anxiety, and suicidal ideation.      Objective:  BP 135/60 mmHg  Pulse 79  Temp(Src) 98 F (36.7 C)  Wt 188 lb (85.276 kg)  SpO2 97%  Physical Exam   General appearance: alert, cooperative and appears stated age Head: Normocephalic, without obvious abnormality, atraumatic Eyes: conjunctivae/corneas clear. PERRL, EOM's intact. Fundi benign. Ears: normal TM's and external ear canals both ears Nose: Nares normal. Septum midline. Mucosa normal. No drainage or sinus tenderness. Throat: lips, mucosa, and tongue normal; teeth and gums normal Neck: no adenopathy, no carotid bruit, no JVD, supple, symmetrical, trachea midline and thyroid not enlarged, symmetric, no tenderness/mass/nodules Lungs: clear to auscultation bilaterally Breasts: normal  appearance, no masses or tenderness Heart: regular rate and rhythm, S1, S2 normal, no murmur, click, rub or gallop Abdomen: soft, non-tender; bowel sounds normal; no masses,  no organomegaly Extremities: extremities normal, atraumatic, no cyanosis or edema.  Right heel warm to the touch. Not tender Pulses: 2+ and symmetric Skin: Skin color, texture, turgor normal. No rashes or lesions on left lateral thigh  Neurologic: Alert and oriented X 3, normal strength and tone. Normal symmetric reflexes. Normal coordination and gait.     Assessment & Plan:   Problem List Items Addressed This Visit    Hyperlipidemia LDL goal <130    LDL and triglycerides  Have been at goal on current medications. she has no side effects ,   No changes today  Lab Results  Component Value Date   CHOL 188 01/15/2015   HDL 74.90 01/15/2015   LDLCALC 100* 01/15/2015   LDLDIRECT 93.0 01/15/2015   TRIG 70.0 01/15/2015   CHOLHDL 3 01/15/2015   Lab Results  Component Value Date   ALT 14 01/15/2015   AST 16 01/15/2015   ALKPHOS 119* 01/15/2015   BILITOT 0.5 01/15/2015              Hypertension    Well controlled on current regimen. Normal lytes and  renal function .  No changes today   Lab Results  Component Value Date   CREATININE 0.97 01/15/2015   Lab Results  Component Value Date   NA 142 01/15/2015   K 4.1 01/15/2015   CL 105 01/15/2015   CO2 28 01/15/2015            Relevant Orders   Comprehensive metabolic panel (Completed)   Major depressive disorder, single episode    Well controlled on current regimen. no changes today.        Medicare annual wellness visit, subsequent    Annual Medicare wellness  exam was done as well as a comprehensive physical exam and management of acute and chronic conditions .  During the course of the visit the patient was educated and counseled about appropriate screening and preventive services including : fall prevention , diabetes screening,  nutrition counseling, colorectal cancer screening, and recommended immunizations.  Printed recommendations for health maintenance screenings was given.       Obsessive compulsive disorder    Improved with substitution of sertaline for citalopram.  No changes today         Obesity    I have addressed  BMI and recommended wt loss of 10% of body weigh over the next 6 months using a low glycemic index diet and regular exercise a minimum of 5 days per week.  Plantar fasciitis of right foot    No significant improvement with conservative therapy and she is now using a cane to assist in walking.  Podiatry evaluation is pending.       Colon cancer screening    patient prefers to continue noninvasive screening.  Cologaurd discussed      Numbness and tingling of right arm    Intermittent,  With normal neurologic exam .  Likely due to DDD cervical spine. Will work up if symptoms become persistent,        Other Visit Diagnoses    Breast cancer screening    -  Primary    Relevant Orders    MM DIGITAL SCREENING BILATERAL    Hyperlipidemia LDL goal <100        Relevant Orders    LDL cholesterol, direct (Completed)    Lipid panel (Completed)    Other fatigue        Relevant Orders    TSH (Completed)    CBC with Differential/Platelet (Completed)    Vitamin D deficiency        Relevant Orders    Vit D  25 hydroxy (rtn osteoporosis monitoring) (Completed)    Screen for STD (sexually transmitted disease)        Relevant Orders    Hepatitis C antibody (Completed)    HIV antibody (Completed)    Screening for osteoporosis        Relevant Orders    DG Bone Density    Hyperglycemia        Relevant Orders    Hemoglobin A1c (Completed)       I am having Ms. Faires maintain her omeprazole, clobetasol cream, allopurinol, sertraline, triamcinolone ointment, pantoprazole, losartan-hydrochlorothiazide, simvastatin, and fluticasone.  No orders of the defined types were placed in  this encounter.    There are no discontinued medications.  Follow-up: No Follow-up on file.   Crecencio Mc, MD

## 2015-01-16 DIAGNOSIS — Z1211 Encounter for screening for malignant neoplasm of colon: Secondary | ICD-10-CM | POA: Insufficient documentation

## 2015-01-16 DIAGNOSIS — R202 Paresthesia of skin: Secondary | ICD-10-CM

## 2015-01-16 DIAGNOSIS — R200 Anesthesia of skin: Secondary | ICD-10-CM | POA: Insufficient documentation

## 2015-01-16 LAB — HIV ANTIBODY (ROUTINE TESTING W REFLEX): HIV: NONREACTIVE

## 2015-01-16 LAB — HEPATITIS C ANTIBODY: HCV AB: NEGATIVE

## 2015-01-16 NOTE — Assessment & Plan Note (Signed)
Well controlled on current regimen.  no changes today.   

## 2015-01-16 NOTE — Assessment & Plan Note (Signed)
patient prefers to continue noninvasive screening.  Cologaurd discussed

## 2015-01-16 NOTE — Assessment & Plan Note (Signed)
No significant improvement with conservative therapy and she is now using a cane to assist in walking.  Podiatry evaluation is pending.

## 2015-01-16 NOTE — Assessment & Plan Note (Signed)

## 2015-01-16 NOTE — Assessment & Plan Note (Signed)
I have addressed  BMI and recommended wt loss of 10% of body weigh over the next 6 months using a low glycemic index diet and regular exercise a minimum of 5 days per week.   

## 2015-01-16 NOTE — Assessment & Plan Note (Signed)
Intermittent,  With normal neurologic exam .  Likely due to DDD cervical spine. Will work up if symptoms become persistent,

## 2015-01-16 NOTE — Assessment & Plan Note (Signed)
LDL and triglycerides  Have been at goal on current medications. she has no side effects ,   No changes today  Lab Results  Component Value Date   CHOL 188 01/15/2015   HDL 74.90 01/15/2015   LDLCALC 100* 01/15/2015   LDLDIRECT 93.0 01/15/2015   TRIG 70.0 01/15/2015   CHOLHDL 3 01/15/2015   Lab Results  Component Value Date   ALT 14 01/15/2015   AST 16 01/15/2015   ALKPHOS 119* 01/15/2015   BILITOT 0.5 01/15/2015

## 2015-01-16 NOTE — Assessment & Plan Note (Signed)
Improved with substitution of sertaline for citalopram.  No changes today

## 2015-01-16 NOTE — Assessment & Plan Note (Signed)
Well controlled on current regimen. Normal lytes and  renal function .  No changes today   Lab Results  Component Value Date   CREATININE 0.97 01/15/2015   Lab Results  Component Value Date   NA 142 01/15/2015   K 4.1 01/15/2015   CL 105 01/15/2015   CO2 28 01/15/2015

## 2015-01-17 ENCOUNTER — Other Ambulatory Visit: Payer: Self-pay | Admitting: Internal Medicine

## 2015-01-17 DIAGNOSIS — R748 Abnormal levels of other serum enzymes: Secondary | ICD-10-CM

## 2015-01-28 DIAGNOSIS — Z23 Encounter for immunization: Secondary | ICD-10-CM | POA: Diagnosis not present

## 2015-01-28 DIAGNOSIS — L920 Granuloma annulare: Secondary | ICD-10-CM | POA: Diagnosis not present

## 2015-01-28 DIAGNOSIS — L821 Other seborrheic keratosis: Secondary | ICD-10-CM | POA: Diagnosis not present

## 2015-01-28 DIAGNOSIS — L570 Actinic keratosis: Secondary | ICD-10-CM | POA: Diagnosis not present

## 2015-02-04 ENCOUNTER — Other Ambulatory Visit: Payer: Self-pay | Admitting: Internal Medicine

## 2015-02-07 DIAGNOSIS — Z1212 Encounter for screening for malignant neoplasm of rectum: Secondary | ICD-10-CM | POA: Diagnosis not present

## 2015-02-07 DIAGNOSIS — Z1211 Encounter for screening for malignant neoplasm of colon: Secondary | ICD-10-CM | POA: Diagnosis not present

## 2015-02-07 LAB — COLOGUARD: COLOGUARD: NEGATIVE

## 2015-02-10 ENCOUNTER — Other Ambulatory Visit (INDEPENDENT_AMBULATORY_CARE_PROVIDER_SITE_OTHER): Payer: Medicare Other

## 2015-02-10 DIAGNOSIS — R748 Abnormal levels of other serum enzymes: Secondary | ICD-10-CM

## 2015-02-10 LAB — HEPATIC FUNCTION PANEL
ALBUMIN: 4.1 g/dL (ref 3.5–5.2)
ALK PHOS: 109 U/L (ref 39–117)
ALT: 16 U/L (ref 0–35)
AST: 18 U/L (ref 0–37)
BILIRUBIN DIRECT: 0.1 mg/dL (ref 0.0–0.3)
BILIRUBIN TOTAL: 0.4 mg/dL (ref 0.2–1.2)
Total Protein: 6.8 g/dL (ref 6.0–8.3)

## 2015-02-14 ENCOUNTER — Encounter: Payer: Self-pay | Admitting: *Deleted

## 2015-02-17 ENCOUNTER — Ambulatory Visit: Payer: Medicare Other | Admitting: Podiatry

## 2015-02-18 DIAGNOSIS — Z1231 Encounter for screening mammogram for malignant neoplasm of breast: Secondary | ICD-10-CM | POA: Diagnosis not present

## 2015-02-18 DIAGNOSIS — E2839 Other primary ovarian failure: Secondary | ICD-10-CM | POA: Diagnosis not present

## 2015-02-18 DIAGNOSIS — M810 Age-related osteoporosis without current pathological fracture: Secondary | ICD-10-CM | POA: Diagnosis not present

## 2015-02-18 LAB — HM MAMMOGRAPHY: HM Mammogram: NEGATIVE

## 2015-02-19 ENCOUNTER — Encounter: Payer: Self-pay | Admitting: Internal Medicine

## 2015-02-20 DIAGNOSIS — Z961 Presence of intraocular lens: Secondary | ICD-10-CM | POA: Diagnosis not present

## 2015-02-27 ENCOUNTER — Telehealth: Payer: Self-pay | Admitting: Internal Medicine

## 2015-02-27 NOTE — Telephone Encounter (Signed)
DEXA scan was normal.  No osteoporosis.  Continue 1200 mg calcium daily through diet and supplements,.  1000 IUS Vit D3 daily and regular weight bearing exercise.   

## 2015-02-28 ENCOUNTER — Encounter: Payer: Self-pay | Admitting: *Deleted

## 2015-02-28 NOTE — Telephone Encounter (Signed)
Letter mailed

## 2015-03-03 NOTE — Telephone Encounter (Signed)
Entered in error

## 2015-03-05 ENCOUNTER — Encounter: Payer: Self-pay | Admitting: *Deleted

## 2015-03-27 ENCOUNTER — Ambulatory Visit (INDEPENDENT_AMBULATORY_CARE_PROVIDER_SITE_OTHER): Payer: Commercial Managed Care - HMO | Admitting: Family Medicine

## 2015-03-27 ENCOUNTER — Encounter: Payer: Self-pay | Admitting: Family Medicine

## 2015-03-27 VITALS — BP 138/72 | HR 82 | Temp 98.5°F | Ht 64.0 in | Wt 188.2 lb

## 2015-03-27 DIAGNOSIS — J209 Acute bronchitis, unspecified: Secondary | ICD-10-CM

## 2015-03-27 MED ORDER — ALBUTEROL SULFATE HFA 108 (90 BASE) MCG/ACT IN AERS: 2.0000 | INHALATION_SPRAY | Freq: Four times a day (QID) | RESPIRATORY_TRACT | Status: DC | PRN

## 2015-03-27 MED ORDER — ALBUTEROL SULFATE (2.5 MG/3ML) 0.083% IN NEBU
2.5000 mg | INHALATION_SOLUTION | Freq: Once | RESPIRATORY_TRACT | Status: AC
  Administered 2015-03-27: 2.5 mg via RESPIRATORY_TRACT

## 2015-03-27 MED ORDER — PREDNISONE 10 MG PO TABS: ORAL_TABLET | ORAL | Status: DC

## 2015-03-27 NOTE — Progress Notes (Signed)
Patient ID: Jamie Anderson, female   DOB: 1935-09-09, 80 y.o.   MRN: [id number]  Tommi Rumps, MD Phone: [phone]  Jamie Anderson is a 80 y.o. female who presents today for same-day visit.  Patient notes she started coughing on Sunday. Notes nasal congestion and postnasal drip as well. She notes she is coughing stuff up though does not know what it looks like. Has had some minimal wheezing with this. Denies fevers, shortness breath, and chest pain. She's been using over-the-counter antihistamines and Flonase. Has not improved with this.  PMH: nonsmoker.   ROS see history of present illness  Objective  Physical Exam Filed Vitals:   03/27/15 1425  BP: 138/72  Pulse: 82  Temp: 98.5 F (36.9 C)    BP Readings from Last 3 Encounters:  03/27/15 138/72  01/15/15 144/69  01/15/15 135/60   Wt Readings from Last 3 Encounters:  03/27/15 188 lb 3.2 oz (85.367 kg)  01/15/15 188 lb (85.276 kg)  12/24/14 188 lb 6 oz (85.446 kg)    Physical Exam  Constitutional: She is well-developed, well-nourished, and in no distress.  HENT:  Head: Normocephalic and atraumatic.  Right Ear: External ear normal.  Left Ear: External ear normal.  Mild posterior pharyngeal erythema and cobblestoning, no exudate, no tonsillar swelling  Eyes: Conjunctivae are normal. Pupils are equal, round, and reactive to light.  Neck: Neck supple.  Cardiovascular: Normal rate, regular rhythm and normal heart sounds.  Exam reveals no gallop and no friction rub.   No murmur heard. Pulmonary/Chest: Effort normal. No respiratory distress. She has wheezes (Faint scattered expiratory wheezes). She has no rales.  Lymphadenopathy:    She has no cervical adenopathy.  Neurological: She is alert. Gait normal.  Skin: Skin is warm and dry. She is not diaphoretic.     Assessment/Plan: Please see individual problem list.  Acute bronchitis Symptoms consistent with viral bronchitis and viral upper respiratory infection.  Patient was given an albuterol nebulizer treatment in the office. She will be sent home with an albuterol inhaler. We'll treat her with prednisone. She will continue to monitor her symptoms and stay well-hydrated. She is given return precautions.    Meds ordered this encounter  Medications  . DISCONTD: albuterol (PROVENTIL HFA;VENTOLIN HFA) 108 (90 Base) MCG/ACT inhaler    Sig: Inhale 2 puffs into the lungs every 6 (six) hours as needed for wheezing or shortness of breath.    Dispense:  1 Inhaler    Refill:  2  . predniSONE (DELTASONE) 10 MG tablet    Sig: Take 50 mg (5 tablets) by mouth daily for 3 days, then decrease by 1 tablet daily by mouth until gone    Dispense:  25 tablet    Refill:  0  . albuterol (PROVENTIL HFA;VENTOLIN HFA) 108 (90 Base) MCG/ACT inhaler    Sig: Inhale 2 puffs into the lungs every 6 (six) hours as needed for wheezing or shortness of breath.    Dispense:  1 Inhaler    Refill:  2  . albuterol (PROVENTIL) (2.5 MG/3ML) 0.083% nebulizer solution 2.5 mg    Sig:      Tommi Rumps

## 2015-03-27 NOTE — Patient Instructions (Signed)
Nice to meet you. Your symptoms are likely related to sinus infection or bronchitis. These are likely viral in nature. We will treat her with prednisone and albuterol.  If you develop chest pain, shortness of breath, fevers, cough productive of blood, or any new or change in symptoms please seek medical attention.

## 2015-03-27 NOTE — Progress Notes (Signed)
Pre visit review using our clinic review tool, if applicable. No additional management support is needed unless otherwise documented below in the visit note. 

## 2015-03-27 NOTE — Assessment & Plan Note (Signed)
Symptoms consistent with viral bronchitis and viral upper respiratory infection. Patient was given an albuterol nebulizer treatment in the office. She will be sent home with an albuterol inhaler. We'll treat her with prednisone. She will continue to monitor her symptoms and stay well-hydrated. She is given return precautions.

## 2015-03-29 ENCOUNTER — Encounter: Payer: Self-pay | Admitting: Gynecology

## 2015-03-29 ENCOUNTER — Ambulatory Visit
Admission: EM | Admit: 2015-03-29 | Discharge: 2015-03-29 | Disposition: A | Discharge status: Home / Self Care | Payer: Medicare Other | Attending: Family Medicine | Admitting: Family Medicine

## 2015-03-29 DIAGNOSIS — J209 Acute bronchitis, unspecified: Secondary | ICD-10-CM

## 2015-03-29 MED ORDER — HYDROCOD POLST-CPM POLST ER 10-8 MG/5ML PO SUER: ORAL | Status: DC

## 2015-03-29 MED ORDER — CEFUROXIME AXETIL 250 MG PO TABS: ORAL_TABLET | ORAL | Status: DC

## 2015-03-29 NOTE — ED Notes (Signed)
Patient c/o cough. Per patient was seen by her PCP x 2 days ago. Patient stated dx with bronchitis and sinus infection. Patient stated was given albuterol and Prednisone 10 mg #25 tab.Per pt. to take 5 tabs for 3 days and 1 tab every day until finish. Patient stated not feeling better/ nasal drip / coughing /head ace.

## 2015-03-29 NOTE — ED Provider Notes (Signed)
CSN: [id number]     Arrival date & time 03/29/15  1159 History   First MD Initiated Contact with Patient 03/29/15 1234     Chief Complaint  Patient presents with  . Cough   (Consider location/radiation/quality/duration/timing/severity/associated sxs/prior Treatment) HPI   This 80 year old female who presents with shortness of breath coughing states that she had a terrible night last night and was unable to get a restful sleep. She went to the pharmacy today and the pharmacist told her that she most likely needs to have an antibiotic because of wheezing that she was having. The patient was seen by her primary care physician days ago who diagnosed her with prompt bronchitis in a sinus infection and placed her on a beer all and prednisone on a tapering dose. Dates that she has 3 more days of prednisone left. Is not been running a fever or chills. He did vomit once this morning because of coughing. Today she is afebrile at 97.2 ;79  Pulse; respirations 16 pressure 150/58 ;99% O2 saturation room air. She does get out of breath easily 12 transferring from chair to table.  Past Medical History  Diagnosis Date  . Hyperlipidemia   . Hypertension   . Menopause   . Granuloma annulare    Past Surgical History  Procedure Laterality Date  . Spine surgery  2010  . Appendectomy    . Venous ablation      bilateral  . Dilation and curettage of uterus    . Right oophorectomy Right feb 2014    [name]   Family History  Problem Relation Age of Onset  . Early death Mother     durig childbirth  . Heart disease Father   . Heart disease Sister   . Cancer Neg Hx    Social History  Substance Use Topics  . Smoking status: Never Smoker   . Smokeless tobacco: None  . Alcohol Use: 2.4 oz/week    4 Glasses of wine per week   OB History    No data available     Review of Systems  Constitutional: Positive for activity change. Negative for fever, chills, diaphoresis and fatigue.  HENT: Positive for  congestion, postnasal drip, rhinorrhea, sinus pressure and sore throat.   Respiratory: Positive for cough, shortness of breath and wheezing. Negative for stridor.   All other systems reviewed and are negative.   Allergies  Biaxin; Corn-containing products; Dust mite extract; Gluten meal; Oat; and Shrimp  Home Medications   Prior to Admission medications   Medication Sig Start Date End Date Taking? Authorizing Provider  albuterol (PROVENTIL HFA;VENTOLIN HFA) 108 (90 Base) MCG/ACT inhaler Inhale 2 puffs into the lungs every 6 (six) hours as needed for wheezing or shortness of breath. 03/27/15  Yes Leone Haven, MD  allopurinol (ZYLOPRIM) 100 MG tablet TAKE 1 TABLET EVERY DAY 02/04/15  Yes Crecencio Mc, MD  clobetasol cream (TEMOVATE) [id number] % Apply 1 application topically 2 (two) times daily. FOR ONE WEEK 05/17/14  Yes Crecencio Mc, MD  fluticasone [facility]) 50 MCG/ACT nasal spray USE 2 SPRAYS IN EACH NOSTRIL EVERY DAY 01/01/15  Yes Crecencio Mc, MD  losartan-hydrochlorothiazide (HYZAAR) 50-12.5 MG per tablet TAKE 2 TABLETS EVERY DAY 11/28/14  Yes Crecencio Mc, MD  meloxicam (MOBIC) 15 MG tablet Take 1 tablet (15 mg total) by mouth daily. 01/15/15  Yes Max T Hyatt, DPM  omeprazole (PRILOSEC) 20 MG capsule Take 20 mg by mouth daily.   Yes Historical Provider, MD  pantoprazole (PROTONIX) 40 MG tablet TAKE 1 TABLET EVERY DAY 10/18/14  Yes Crecencio Mc, MD  predniSONE (DELTASONE) 10 MG tablet Take 50 mg (5 tablets) by mouth daily for 3 days, then decrease by 1 tablet daily by mouth until gone 03/27/15  Yes Leone Haven, MD  sertraline (ZOLOFT) 100 MG tablet TAKE 1 TABLET EVERY DAY  (REPLACES  CITALOPRAM) 10/10/14  Yes Crecencio Mc, MD  simvastatin (ZOCOR) 40 MG tablet TAKE 1 TABLET AT BEDTIME 12/18/14  Yes Crecencio Mc, MD  triamcinolone ointment (KENALOG) 0.1 % Apply 1 application topically 2 (two) times daily as needed. 10/17/14  Yes Coral Spikes, DO  cefUROXime (CEFTIN) 250 MG  tablet Take one tablet BID with food 03/29/15   Lorin Picket, PA-C  chlorpheniramine-HYDROcodone [facility]) 10-8 MG/5ML SUER Take 2.5 ml PO at bedtime for cough 03/29/15   Lorin Picket, PA-C   Meds Ordered and Administered this Visit  Medications - No data to display  BP 154/58 mmHg  Pulse 79  Temp(Src) 97.2 F (36.2 C) (Tympanic)  Resp 16  Ht 5\' 4"  (1.626 m)  Wt 188 lb (85.276 kg)  BMI 32.25 kg/m2  SpO2 99% No data found.   Physical Exam  Constitutional: She is oriented to person, place, and time. She appears well-developed and well-nourished. No distress.  HENT:  Head: Normocephalic and atraumatic.  Right Ear: External ear normal.  Left Ear: External ear normal.  Nose: Nose normal.  Mouth/Throat: Oropharynx is clear and moist. No oropharyngeal exudate.  Eyes: Conjunctivae are normal. Pupils are equal, round, and reactive to light.  Neck: Normal range of motion. Neck supple.  Pulmonary/Chest: Effort normal and breath sounds normal. No stridor. No respiratory distress. She has no wheezes. She has no rales.  Musculoskeletal: Normal range of motion. She exhibits no edema or tenderness.  Lymphadenopathy:    She has no cervical adenopathy.  Neurological: She is alert and oriented to person, place, and time.  Skin: Skin is warm and dry. She is not diaphoretic.  Psychiatric: She has a normal mood and affect. Her behavior is normal. Judgment and thought content normal.  Nursing note and vitals reviewed.   ED Course  Procedures (including critical care time)  Labs Review Labs Reviewed - No data to display  Imaging Review No results found.   Visual Acuity Review  Right Eye Distance:   Left Eye Distance:   Bilateral Distance:    Right Eye Near:   Left Eye Near:    Bilateral Near:         MDM   1. Acute bronchitis, unspecified organism    Discharge Medication List as of 03/29/2015  1:03 PM    START taking these medications   Details   cefUROXime (CEFTIN) 250 MG tablet Take one tablet BID with food, Print    chlorpheniramine-HYDROcodone (TUSSIONEX PENNKINETIC ER) 10-8 MG/5ML SUER Take 2.5 ml PO at bedtime for cough, Print      Plan: 1. Tiagnosis reviewed with patient 2. rx as per orders; risks, benefits, potential side effects reviewed with patient 3. Recommend supportive treatment with rest and fluids. To assist her sleeping at night I have given her low dose of Tussionex and warned her not to exceed the dosage that posted, and use for the shortest period time possible. She does also need to be aware that it can cause dizziness and be careful of falling. I have placed her on an antibiotic but have told her not  to take it until at least a week from now as this is most likely viral and might not help her symptoms which should subside on their own. Also asked her to contact her primary care physician for advise on beginning the antibiotic. She should finish her prednisone and she try to sleep in a semisitting position to help keep from pooling secretions. If she develops a fever chills or is not feeling improvement, she should return to her primary care or go to the emergency room for further evaluation  4. F/u prn if symptoms worsen or don't improve     Lorin Picket, PA-C 03/29/15 1316

## 2015-03-29 NOTE — Discharge Instructions (Signed)

## 2015-04-10 ENCOUNTER — Telehealth: Payer: Self-pay | Admitting: Family Medicine

## 2015-04-10 ENCOUNTER — Ambulatory Visit
Admission: RE | Admit: 2015-04-10 | Discharge: 2015-04-10 | Disposition: A | Discharge status: Home / Self Care | Payer: Commercial Managed Care - HMO | Source: Ambulatory Visit | Attending: Family Medicine | Admitting: Family Medicine

## 2015-04-10 ENCOUNTER — Ambulatory Visit (INDEPENDENT_AMBULATORY_CARE_PROVIDER_SITE_OTHER): Payer: Commercial Managed Care - HMO | Admitting: Family Medicine

## 2015-04-10 ENCOUNTER — Encounter: Payer: Self-pay | Admitting: Family Medicine

## 2015-04-10 VITALS — BP 136/72 | HR 94 | Temp 98.3°F | Ht 64.0 in | Wt 192.4 lb

## 2015-04-10 DIAGNOSIS — R059 Cough, unspecified: Secondary | ICD-10-CM

## 2015-04-10 DIAGNOSIS — R0989 Other specified symptoms and signs involving the circulatory and respiratory systems: Secondary | ICD-10-CM | POA: Insufficient documentation

## 2015-04-10 DIAGNOSIS — R300 Dysuria: Secondary | ICD-10-CM

## 2015-04-10 DIAGNOSIS — R05 Cough: Secondary | ICD-10-CM | POA: Insufficient documentation

## 2015-04-10 DIAGNOSIS — J209 Acute bronchitis, unspecified: Secondary | ICD-10-CM | POA: Diagnosis not present

## 2015-04-10 DIAGNOSIS — R062 Wheezing: Secondary | ICD-10-CM | POA: Diagnosis not present

## 2015-04-10 LAB — POCT URINALYSIS DIPSTICK
BILIRUBIN UA: NEGATIVE
Glucose, UA: NEGATIVE
LEUKOCYTES UA: NEGATIVE
Nitrite, UA: NEGATIVE
PH UA: 5.5
PROTEIN UA: NEGATIVE
RBC UA: NEGATIVE
Spec Grav, UA: 1.02
Urobilinogen, UA: 0.2

## 2015-04-10 MED ORDER — PREDNISONE 10 MG PO TABS: ORAL_TABLET | ORAL | Status: DC

## 2015-04-10 NOTE — Telephone Encounter (Signed)
Called and spoke with patient regarding x-ray results. Negative for pneumonia. As we discussed in the office we'll treat with a prednisone taper. She voiced understanding.

## 2015-04-10 NOTE — Progress Notes (Signed)
Patient ID: Jamie Anderson, female   DOB: [birth year], 80 y.o.   MRN: [id number]  Tommi Rumps, MD Phone: [phone]  Jamie Anderson is a 80 y.o. female who presents today for follow-up.  Patient notes continued cough. She feels congested in her chest andher nose. She continues to have postnasal drip. She she does feel little bit better after the last time I saw her. She was seen at an urgent care and advised that she likely had a viral illness. They did give her Ceftin to take if she did not improve and she has taken this and she feels slightly better after taking this. She's also completed a course of prednisone. She's been using her albuterol every 6 hours though less yesterday and today. She denies fevers, chest pain, and shortness of breath.  She additionally notes possible dysuria yesterday. She notes some frequency and minimal urgency. No dysuria today. No abdominal pain. No fevers. No vaginal discharge.  PMH: nonsmoker.   ROSsee history of present illness  Objective  Physical Exam Filed Vitals:   04/10/15 1330  BP: 136/72  Pulse: 94  Temp: 98.3 F (36.8 C)    Physical Exam  Constitutional: She is well-developed, well-nourished, and in no distress.  HENT:  Head: Normocephalic and atraumatic.  Right Ear: External ear normal.  Left Ear: External ear normal.  Mouth/Throat: No oropharyngeal exudate.  Mild posterior oropharyngeal erythema, normal TMs bilaterally  Eyes: Conjunctivae are normal. Pupils are equal, round, and reactive to light.  Neck: Neck supple.  Cardiovascular: Normal rate, regular rhythm and normal heart sounds.  Exam reveals no gallop and no friction rub.   No murmur heard. Pulmonary/Chest: Effort normal and breath sounds normal. No respiratory distress. She has no wheezes. She has no rales.  Abdominal: Soft. Bowel sounds are normal. She exhibits no distension. There is no tenderness. There is no rebound and no guarding.  Lymphadenopathy:    She has no  cervical adenopathy.  Neurological: She is alert. Gait normal.  Skin: Skin is warm and dry. She is not diaphoretic.     Assessment/Plan: Please see individual problem list.  Acute bronchitis Patient notes mildly improving cough and congestion. She is s/p treatment with prednisone and ceftin for this. She feel mildly improved. She has a benign lung exam today. She is well appearing. Her vitals are stable. Given persistence and reported wheezing we will obtain a CXR to rule out PNA as cause. Suspect post viral bronchitic cough. If negative for PNA will treat with another round of prednisone. Treat with antibiotics if positive for PNA. Given return precautions.   Addendum: CXR with no PNA. Will treat with prednisone taper.   Dysuria Mild dysuria yesterday. Some urgency and frequency. No vaginal complaints. Will check UA. If positive will treat for UTI. Given return precautions.     Orders Placed This Encounter  Procedures  . DG Chest 2 View    Standing Status: Future     Number of Occurrences: 1     Standing Expiration Date: 06/07/2016    Order Specific Question:  Reason for Exam (SYMPTOM  OR DIAGNOSIS REQUIRED)    Answer:  cough for 3 weeks, reported wheezing    Order Specific Question:  Preferred imaging location?    Answer:  [facility]  . POCT Urinalysis Dipstick    Meds ordered this encounter  Medications  . predniSONE (DELTASONE) 10 MG tablet    Sig: Take 50 mg (5 tablets) by mouth daily for 3 days, then  decrease by 1 tablet daily by mouth until gone    Dispense:  25 tablet    Refill:  0     Tommi Rumps

## 2015-04-10 NOTE — Assessment & Plan Note (Addendum)
Mild dysuria yesterday. Some urgency and frequency. No vaginal complaints. Will check UA. If positive will treat for UTI. Given return precautions.

## 2015-04-10 NOTE — Progress Notes (Signed)
Pre visit review using our clinic review tool, if applicable. No additional management support is needed unless otherwise documented below in the visit note. 

## 2015-04-10 NOTE — Assessment & Plan Note (Addendum)
Patient notes mildly improving cough and congestion. She is s/p treatment with prednisone and ceftin for this. She feel mildly improved. She has a benign lung exam today. She is well appearing. Her vitals are stable. Given persistence and reported wheezing we will obtain a CXR to rule out PNA as cause. Suspect post viral bronchitic cough. If negative for PNA will treat with another round of prednisone. Treat with antibiotics if positive for PNA. Given return precautions.   Addendum: CXR with no PNA. Will treat with prednisone taper.

## 2015-04-10 NOTE — Patient Instructions (Signed)
Nice to see you. Please go get the chest x-ray to evaluate for cause of your cough. If it reveals a pneumonia we will treat with antibiotic. If it does not reveal a pneumonia we'll treat you with another course of prednisone. Please continue to use her albuterol inhaler as needed. We will check her urine for infection. Please start taking a probiotic. If you develop chest pain, shortness of breath, fevers, abdominal pain, nausea, vomiting, diarrhea, or any new or change in symptoms please seek medical attention.

## 2015-04-22 ENCOUNTER — Telehealth: Payer: Self-pay | Admitting: Internal Medicine

## 2015-05-01 ENCOUNTER — Telehealth: Payer: Self-pay | Admitting: Internal Medicine

## 2015-05-01 NOTE — Telephone Encounter (Signed)
Pt called about having a Rash under tongue and she has a bad taste all the time. Pt thinks it's coming from all of the medication she was on. Pt wanted to know if something can be called in? Call pt @ [phone]. Thank you!

## 2015-05-01 NOTE — Telephone Encounter (Signed)
LMOMTCB

## 2015-05-02 MED ORDER — NYSTATIN 100000 UNIT/ML MT SUSP: 500000.0000 [IU] | Freq: Four times a day (QID) | OROMUCOSAL | Status: DC

## 2015-05-02 NOTE — Telephone Encounter (Signed)
Please advise. Thanks.  

## 2015-05-02 NOTE — Telephone Encounter (Signed)
Sorry for that, I should looked today at who saw the patient saw last too.  Thanks  Spoke with the patient.  Verbalized understanding for new medication

## 2015-05-02 NOTE — Telephone Encounter (Signed)
Patient requested an update She will be at [phone]

## 2015-05-02 NOTE — Telephone Encounter (Signed)
Tanya,  Patient was seen by Dr Caryl Bis on Feb 2 for cough and was given prednisone.  This could have been handled  YESTERDAY if the   Protocol  Was followed:  to send to the last doctor who treated.  I will take care of it but please ask the staff start USING PROTOCOLS   Nystatin swish and swallow sent to pharmacy for thrush

## 2015-05-12 ENCOUNTER — Other Ambulatory Visit: Payer: Self-pay | Admitting: Internal Medicine

## 2015-05-12 NOTE — Telephone Encounter (Signed)
90 day supply authorized and sent   

## 2015-05-12 NOTE — Telephone Encounter (Signed)
Refilled in November. Please advise?

## 2015-05-19 NOTE — Telephone Encounter (Signed)
Spoke with patient to clarify,  She states that the bumps are still there and she still has a horrible taste in her mouth.   Tongue is red, under her tongue in the groove the bumps are still there.  States that the nystatin helpped a little but it hasn't gone away.  Please advise?

## 2015-05-19 NOTE — Telephone Encounter (Signed)
Pt lvm stating that the taste in her mouth and bumps are not any better. Asking for something else to take to try to relieve.

## 2015-05-19 NOTE — Telephone Encounter (Signed)
She'll have to be seen by first availabel doc i cannot treat again without an exam

## 2015-05-20 NOTE — Telephone Encounter (Signed)
Would you be able to see tomorrow at 430? Thanks

## 2015-05-20 NOTE — Telephone Encounter (Signed)
Give Ms Neidhart an appt with Dr Lacinda Axon instead of the two [place] patients I will see at 4:30

## 2015-05-21 ENCOUNTER — Encounter: Payer: Self-pay | Admitting: Family Medicine

## 2015-05-21 ENCOUNTER — Telehealth: Payer: Self-pay

## 2015-05-21 ENCOUNTER — Ambulatory Visit (INDEPENDENT_AMBULATORY_CARE_PROVIDER_SITE_OTHER): Payer: Commercial Managed Care - HMO | Admitting: Family Medicine

## 2015-05-21 VITALS — BP 148/86 | HR 82 | Temp 98.4°F | Ht 64.0 in | Wt 193.4 lb

## 2015-05-21 DIAGNOSIS — R438 Other disturbances of smell and taste: Secondary | ICD-10-CM | POA: Diagnosis not present

## 2015-05-21 MED ORDER — LOSARTAN POTASSIUM-HCTZ 50-12.5 MG PO TABS: 2.0000 | ORAL_TABLET | Freq: Every day | ORAL | Status: DC

## 2015-05-21 NOTE — Progress Notes (Signed)
Pre visit review using our clinic review tool, if applicable. No additional management support is needed unless otherwise documented below in the visit note. 

## 2015-05-21 NOTE — Telephone Encounter (Signed)
Spoke with patient and scheduled to see Dr. Lacinda Axon at [facility].

## 2015-05-21 NOTE — Patient Instructions (Signed)
This is a medication side effect.  Continue good hygeine.  Take care  Dr. Lacinda Axon

## 2015-05-21 NOTE — Assessment & Plan Note (Signed)
New problem. Exam unremarkable. No ulcerations/lesions noted. Likely post viral and from medications. Reassurance provided.

## 2015-05-21 NOTE — Telephone Encounter (Signed)
Pt called about having her bp medication refilled. Bp med filled

## 2015-05-21 NOTE — Progress Notes (Signed)
   Subjective:  Patient ID: Jamie Anderson, female    DOB: December 25, 1935  Age: 80 y.o. MRN: [id number]  CC: Bad taste in mouth and mouth sores  HPI:  80 year old female presents with the above complaints.  Patient developed bronchitis in January and was treated with antibiotics, albuterol, and prednisone. Shortly after she developed a bad taste in her mouth as well as some sores on the tongue. She thought that this was medication induced and thought it would improve after she completed the courses of the medications. She continues to have a bad taste in her mouth which she describes as metallic. She also has "bumps" on her tongue. No known exacerbating factors. She's had no improvement. No associated fevers or chills. No other associated symptoms at this time.  Social Hx   Social History   Social History  . Marital Status: Widowed    Spouse Name: N/A  . Number of Children: N/A  . Years of Education: N/A   Social History Main Topics  . Smoking status: Never Smoker   . Smokeless tobacco: None  . Alcohol Use: 2.4 oz/week    4 Glasses of wine per week  . Drug Use: None  . Sexual Activity: Not Currently   Other Topics Concern  . None   Social History Narrative   Review of Systems  Constitutional: Negative.   HENT: Positive for mouth sores.        Bad taste in mouth.   Objective:  BP 148/86 mmHg  Pulse 82  Temp(Src) 98.4 F (36.9 C) (Oral)  Ht 5\' 4"  (1.626 m)  Wt 193 lb 6 oz (87.714 kg)  BMI 33.18 kg/m2  SpO2 97%  BP/Weight 05/21/2015 04/10/2015 [id number]  Systolic BP 123456 [id number] 123456  Diastolic BP 86 72 58  Wt. (Lbs) 193.38 192.4 188  BMI 33.18 33.01 32.25   Physical Exam  Constitutional: She is oriented to person, place, and time. She appears well-developed. No distress.  HENT:  Mouth/Throat: Oropharynx is clear and moist.  Normal mucosa noted. No ulcerations/mouth lesions noted.  Pulmonary/Chest: Effort normal.  Neurological: She is alert and oriented to person, place,  and time.  Psychiatric: She has a normal mood and affect.  Vitals reviewed.  Lab Results  Component Value Date   WBC 7.3 01/15/2015   HGB 12.5 01/15/2015   HCT 37.3 01/15/2015   PLT 189.0 01/15/2015   GLUCOSE 95 01/15/2015   CHOL 188 01/15/2015   TRIG 70.0 01/15/2015   HDL 74.90 01/15/2015   LDLDIRECT 93.0 01/15/2015   LDLCALC 100* 01/15/2015   ALT 16 02/10/2015   AST 18 02/10/2015   NA 142 01/15/2015   K 4.1 01/15/2015   CL 105 01/15/2015   CREATININE 0.97 01/15/2015   BUN 22 01/15/2015   CO2 28 01/15/2015   TSH 1.38 01/15/2015   INR 0.9 05/29/2012   HGBA1C 5.4 01/15/2015   MICROALBUR 2.8* 01/25/2013    Assessment & Plan:   Problem List Items Addressed This Visit    Bad taste in mouth - Primary    New problem. Exam unremarkable. No ulcerations/lesions noted. Likely post viral and from medications. Reassurance provided.         Follow-up: PRN  [place]

## 2015-07-04 ENCOUNTER — Telehealth: Payer: Self-pay | Admitting: *Deleted

## 2015-07-04 NOTE — Telephone Encounter (Signed)
Pt states she was calling to see if she could get plantar fascial brace.  I reviewed pt's Chart Review and LOV was 01/2015, left message to make an appt informing pt that 5 months was too long to continue to be in pain.

## 2015-08-12 DIAGNOSIS — L570 Actinic keratosis: Secondary | ICD-10-CM | POA: Diagnosis not present

## 2015-08-12 DIAGNOSIS — L659 Nonscarring hair loss, unspecified: Secondary | ICD-10-CM | POA: Diagnosis not present

## 2015-08-12 DIAGNOSIS — L821 Other seborrheic keratosis: Secondary | ICD-10-CM | POA: Diagnosis not present

## 2015-08-12 DIAGNOSIS — L920 Granuloma annulare: Secondary | ICD-10-CM | POA: Diagnosis not present

## 2015-08-12 DIAGNOSIS — L820 Inflamed seborrheic keratosis: Secondary | ICD-10-CM | POA: Diagnosis not present

## 2015-08-28 ENCOUNTER — Other Ambulatory Visit: Payer: Self-pay | Admitting: Internal Medicine

## 2015-09-18 ENCOUNTER — Other Ambulatory Visit: Payer: Self-pay | Admitting: Internal Medicine

## 2015-09-29 ENCOUNTER — Other Ambulatory Visit: Payer: Self-pay | Admitting: Internal Medicine

## 2015-11-04 ENCOUNTER — Telehealth: Payer: Self-pay | Admitting: *Deleted

## 2015-11-04 DIAGNOSIS — I83813 Varicose veins of bilateral lower extremities with pain: Secondary | ICD-10-CM | POA: Diagnosis not present

## 2015-11-04 DIAGNOSIS — E785 Hyperlipidemia, unspecified: Secondary | ICD-10-CM | POA: Diagnosis not present

## 2015-11-04 DIAGNOSIS — I10 Essential (primary) hypertension: Secondary | ICD-10-CM | POA: Diagnosis not present

## 2015-11-04 DIAGNOSIS — R58 Hemorrhage, not elsewhere classified: Secondary | ICD-10-CM | POA: Diagnosis not present

## 2015-11-04 NOTE — Telephone Encounter (Signed)
Pt called [facility], she scheduled an appt because of varicose vain's. [place] questioned if this appt would be needed at their office.

## 2015-11-04 NOTE — Telephone Encounter (Signed)
Message is unclear.  I have no mention of varicose veins  in problem list an I don't understand what the question is  She will need to be seen here for me to make a referral, if that's what you MEAN

## 2015-11-04 NOTE — Telephone Encounter (Signed)
Please advise if patient needs to see vein and vascular?

## 2015-11-04 NOTE — Telephone Encounter (Signed)
LVM fr pt to call office to schedule an appt

## 2015-11-04 NOTE — Telephone Encounter (Signed)
Patient needs an appt with [place] first to then get referral if needed. This was not a listed problem for them currently. thanks

## 2015-11-06 DIAGNOSIS — I83813 Varicose veins of bilateral lower extremities with pain: Secondary | ICD-10-CM | POA: Diagnosis not present

## 2015-11-06 DIAGNOSIS — I8312 Varicose veins of left lower extremity with inflammation: Secondary | ICD-10-CM | POA: Diagnosis not present

## 2015-11-06 DIAGNOSIS — R58 Hemorrhage, not elsewhere classified: Secondary | ICD-10-CM | POA: Diagnosis not present

## 2015-11-06 DIAGNOSIS — I890 Lymphedema, not elsewhere classified: Secondary | ICD-10-CM | POA: Diagnosis not present

## 2015-11-06 DIAGNOSIS — M79609 Pain in unspecified limb: Secondary | ICD-10-CM | POA: Diagnosis not present

## 2015-11-06 DIAGNOSIS — I10 Essential (primary) hypertension: Secondary | ICD-10-CM | POA: Diagnosis not present

## 2015-11-06 DIAGNOSIS — E785 Hyperlipidemia, unspecified: Secondary | ICD-10-CM | POA: Diagnosis not present

## 2015-11-06 DIAGNOSIS — M7989 Other specified soft tissue disorders: Secondary | ICD-10-CM | POA: Diagnosis not present

## 2015-11-25 ENCOUNTER — Other Ambulatory Visit: Payer: Self-pay | Admitting: Internal Medicine

## 2015-12-01 ENCOUNTER — Other Ambulatory Visit: Payer: Self-pay | Admitting: Internal Medicine

## 2015-12-02 DIAGNOSIS — E785 Hyperlipidemia, unspecified: Secondary | ICD-10-CM | POA: Diagnosis not present

## 2015-12-02 DIAGNOSIS — M7989 Other specified soft tissue disorders: Secondary | ICD-10-CM | POA: Diagnosis not present

## 2015-12-02 DIAGNOSIS — M79609 Pain in unspecified limb: Secondary | ICD-10-CM | POA: Diagnosis not present

## 2015-12-02 DIAGNOSIS — I890 Lymphedema, not elsewhere classified: Secondary | ICD-10-CM | POA: Diagnosis not present

## 2015-12-02 DIAGNOSIS — I8312 Varicose veins of left lower extremity with inflammation: Secondary | ICD-10-CM | POA: Diagnosis not present

## 2015-12-02 DIAGNOSIS — R58 Hemorrhage, not elsewhere classified: Secondary | ICD-10-CM | POA: Diagnosis not present

## 2015-12-02 DIAGNOSIS — I83813 Varicose veins of bilateral lower extremities with pain: Secondary | ICD-10-CM | POA: Diagnosis not present

## 2015-12-02 DIAGNOSIS — I10 Essential (primary) hypertension: Secondary | ICD-10-CM | POA: Diagnosis not present

## 2015-12-11 ENCOUNTER — Other Ambulatory Visit: Payer: Self-pay | Admitting: Internal Medicine

## 2015-12-11 DIAGNOSIS — E559 Vitamin D deficiency, unspecified: Secondary | ICD-10-CM

## 2015-12-11 DIAGNOSIS — R5383 Other fatigue: Secondary | ICD-10-CM

## 2015-12-11 DIAGNOSIS — I10 Essential (primary) hypertension: Secondary | ICD-10-CM

## 2015-12-11 DIAGNOSIS — E785 Hyperlipidemia, unspecified: Secondary | ICD-10-CM

## 2015-12-11 DIAGNOSIS — M1000 Idiopathic gout, unspecified site: Secondary | ICD-10-CM

## 2015-12-12 NOTE — Telephone Encounter (Deleted)
Please

## 2015-12-12 NOTE — Telephone Encounter (Signed)
Last prescribed 02/04/2015. Last visit with Dr. Derrel Nip was on 01/15/2015. Scheduled for physical on 01/16/2016.

## 2015-12-12 NOTE — Telephone Encounter (Signed)
Needs fasting labs prior to visit ,.  Ordered for any time

## 2016-01-16 ENCOUNTER — Ambulatory Visit (INDEPENDENT_AMBULATORY_CARE_PROVIDER_SITE_OTHER): Payer: Commercial Managed Care - HMO | Admitting: Internal Medicine

## 2016-01-16 VITALS — BP 150/68 | HR 76 | Temp 97.7°F | Ht 64.0 in | Wt 187.0 lb

## 2016-01-16 DIAGNOSIS — F422 Mixed obsessional thoughts and acts: Secondary | ICD-10-CM

## 2016-01-16 DIAGNOSIS — E6609 Other obesity due to excess calories: Secondary | ICD-10-CM

## 2016-01-16 DIAGNOSIS — I10 Essential (primary) hypertension: Secondary | ICD-10-CM

## 2016-01-16 DIAGNOSIS — Z0000 Encounter for general adult medical examination without abnormal findings: Secondary | ICD-10-CM

## 2016-01-16 DIAGNOSIS — N950 Postmenopausal bleeding: Secondary | ICD-10-CM | POA: Diagnosis not present

## 2016-01-16 DIAGNOSIS — M810 Age-related osteoporosis without current pathological fracture: Secondary | ICD-10-CM

## 2016-01-16 DIAGNOSIS — R300 Dysuria: Secondary | ICD-10-CM

## 2016-01-16 DIAGNOSIS — E785 Hyperlipidemia, unspecified: Secondary | ICD-10-CM | POA: Diagnosis not present

## 2016-01-16 DIAGNOSIS — M1000 Idiopathic gout, unspecified site: Secondary | ICD-10-CM

## 2016-01-16 DIAGNOSIS — N952 Postmenopausal atrophic vaginitis: Secondary | ICD-10-CM

## 2016-01-16 DIAGNOSIS — Z6831 Body mass index (BMI) 31.0-31.9, adult: Secondary | ICD-10-CM | POA: Diagnosis not present

## 2016-01-16 LAB — URIC ACID: URIC ACID, SERUM: 6.4 mg/dL (ref 2.4–7.0)

## 2016-01-16 MED ORDER — ESTROGENS, CONJUGATED 0.625 MG/GM VA CREA: 1.0000 | TOPICAL_CREAM | VAGINAL | 12 refills | Status: DC

## 2016-01-16 NOTE — Progress Notes (Signed)
Patient ID: Jamie Anderson, female    DOB: 1936/02/27  Age: 80 y.o. MRN: [id number]  The patient is here for annual Medicare wellness examination and management of other chronic and acute problems.  Last seen a year ago.     Requesting a pelvic exam /PAP (last one 2104 insufficient cells and HPV neg ) due to frequent episodes of dysuria. Last episode occurred several days ago    NORMAL COLONOSOCPY 2008 DEXA 2016 [facility] normal T scores  Mammogram Dec 2016 normal   The risk factors are reflected in the social history.  The roster of all physicians providing medical care to patient - is listed in the Snapshot section of the chart.  Activities of daily living:  The patient is 100% independent in all ADLs: dressing, toileting, feeding as well as independent mobility  Home safety : The patient has smoke detectors in the home. They wear seatbelts.  There are no firearms at home. There is no violence in the home.   There is no risks for hepatitis, STDs or HIV. There is no   history of blood transfusion. They have no travel history to infectious disease endemic areas of the world.  The patient has seen their dentist in the last six month. They have seen their eye doctor in the last year. They admit to slight hearing difficulty with regard to whispered voices and some television programs.  They have deferred audiologic testing in the last year.  They do not  have excessive sun exposure. Discussed the need for sun protection: hats, long sleeves and use of sunscreen if there is significant sun exposure.   Diet: the importance of a healthy diet is discussed. They do have a healthy diet.  The benefits of regular aerobic exercise were discussed. She walks 4 times per week ,  20 minutes.   Depression screen: there are no signs or vegative symptoms of depression- irritability, change in appetite, anhedonia, sadness/tearfullness.  Cognitive assessment: the patient manages all their financial and personal  affairs and is actively engaged. They could relate day,date,year and events; recalled 2/3 objects at 3 minutes; performed clock-face test normally.  The following portions of the patient's history were reviewed and updated as appropriate: allergies, current medications, past family history, past medical history,  past surgical history, past social history  and problem list.  Visual acuity was not assessed per patient preference since she has regular follow up with her ophthalmologist. Hearing and body mass index were assessed and reviewed.   During the course of the visit the patient was educated and counseled about appropriate screening and preventive services including : fall prevention , diabetes screening, nutrition counseling, colorectal cancer screening, and recommended immunizations.    CC: The primary encounter diagnosis was Hyperlipidemia LDL goal <130. Diagnoses of Essential hypertension, Idiopathic gout, unspecified chronicity, unspecified site, Postmenopausal vaginal bleeding, Dysuria, Post-menopausal atrophic vaginitis, Mixed obsessional thoughts and acts, Encounter for preventive health examination, Age-related osteoporosis without current pathological fracture, and Class 1 obesity due to excess calories without serious comorbidity with body mass index (BMI) of 31.0 to 31.9 in adult were also pertinent to this visit.  History [place] has a past medical history of Granuloma annulare; Hyperlipidemia; Hypertension; and Menopause.   She has a past surgical history that includes Spine surgery (2010); Appendectomy; Venous ablation; Dilation and curettage of uterus; and Right oophorectomy (Right, feb 2014).   Her family history includes Early death in her mother; Heart disease in her father and sister.She reports that she  has never smoked. She does not have any smokeless tobacco history on file. She reports that she drinks about 2.4 oz of alcohol per week . Her drug history is not on  file.  Outpatient Medications Prior to Visit  Medication Sig Dispense Refill  . allopurinol (ZYLOPRIM) 100 MG tablet TAKE 1 TABLET EVERY DAY 90 tablet 0  . fluticasone (FLONASE) 50 MCG/ACT nasal spray USE 2 SPRAYS IN EACH NOSTRIL EVERY DAY 48 g 3  . losartan-hydrochlorothiazide (HYZAAR) 50-12.5 MG tablet TAKE 2 TABLETS EVERY DAY 180 tablet 1  . meloxicam (MOBIC) 15 MG tablet TAKE 1 TABLET EVERY DAY 90 tablet 1  . pantoprazole (PROTONIX) 40 MG tablet TAKE 1 TABLET EVERY DAY 90 tablet 3  . sertraline (ZOLOFT) 100 MG tablet TAKE 1 TABLET EVERY DAY  (REPLACES  CITALOPRAM) 90 tablet 0  . simvastatin (ZOCOR) 40 MG tablet TAKE 1 TABLET AT BEDTIME 90 tablet 1  . cefUROXime (CEFTIN) 250 MG tablet Take one tablet BID with food 20 tablet 0  . clobetasol cream (TEMOVATE) [id number] % Apply 1 application topically 2 (two) times daily. FOR ONE WEEK 30 g 2  . nystatin (MYCOSTATIN) 100000 UNIT/ML suspension Take 5 mLs (500,000 Units total) by mouth 4 (four) times daily. (Patient not taking: Reported on 05/21/2015) 100 mL 0  . omeprazole (PRILOSEC) 20 MG capsule Take 20 mg by mouth daily.    . predniSONE (DELTASONE) 10 MG tablet Take 50 mg (5 tablets) by mouth daily for 3 days, then decrease by 1 tablet daily by mouth until gone 25 tablet 0  . triamcinolone ointment (KENALOG) 0.1 % Apply 1 application topically 2 (two) times daily as needed. 30 g 0   No facility-administered medications prior to visit.     Review of Systems   Patient denies headache, fevers, malaise, unintentional weight loss, skin rash, eye pain, sinus congestion and sinus pain, sore throat, dysphagia,  hemoptysis , cough, dyspnea, wheezing, chest pain, palpitations, orthopnea, edema, abdominal pain, nausea, melena, diarrhea, constipation, flank pain,hematuria, urinary  Frequency, nocturia, numbness, tingling, seizures,  Focal weakness, Loss of consciousness,  Tremor, insomnia, depression, , and suicidal ideation.      Objective:  BP (!) 150/68    Pulse 76   Temp 97.7 F (36.5 C) (Oral)   Ht 5\' 4"  (1.626 m)   Wt 187 lb (84.8 kg)   SpO2 96%   BMI 32.10 kg/m   Physical Exam   General Appearance:    Alert, cooperative, no distress, appears stated age  Head:    Normocephalic, without obvious abnormality, atraumatic  Eyes:    PERRL, conjunctiva/corneas clear, EOM's intact, fundi    benign, both eyes  Ears:    Normal TM's and external ear canals, both ears  Nose:   Nares normal, septum midline, mucosa normal, no drainage    or sinus tenderness  Throat:   Lips, mucosa, and tongue normal; teeth and gums normal  Neck:   Supple, symmetrical, trachea midline, no adenopathy;    thyroid:  no enlargement/tenderness/nodules; no carotid   bruit or JVD  Back:     Symmetric, no curvature, ROM normal, no CVA tenderness  Lungs:     Clear to auscultation bilaterally, respirations unlabored  Chest Wall:    No tenderness or deformity   Heart:    Regular rate and rhythm, S1 and S2 normal, no murmur, rub   or gallop  Breast Exam:    No tenderness, masses, or nipple abnormality  Abdomen:  Soft, non-tender, bowel sounds active all four quadrants,    no masses, no organomegaly  Genitalia:    Pelvic: cervix normal in appearance, external genitalia notable for atrophic changes , no adnexal masses or tenderness, no cervical motion tenderness, rectovaginal septum normal, uterus normal size, shape, and consistency and vagina atrophic without discharge  Extremities:   Extremities normal, atraumatic, no cyanosis or edema  Pulses:   2+ and symmetric all extremities  Skin:   Skin color, texture, turgor normal, no rashes or lesions  Lymph nodes:   Cervical, supraclavicular, and axillary nodes normal  Neurologic:   CNII-XII intact, normal strength, sensation and reflexes    throughout      Assessment & Plan:   Problem List Items Addressed This Visit    Hyperlipidemia LDL goal <130 - Primary    LDL and triglycerides  Have been at goal on current  medications, but she has not had fasting labs or liver ezymes in over a year  She has no known side effects .   She wants to suspend the medication and return after 3 months for reassessment without therapy.  Lab Results  Component Value Date   CHOL 188 01/15/2015   HDL 74.90 01/15/2015   LDLCALC 100 (H) 01/15/2015   LDLDIRECT 93.0 01/15/2015   TRIG 70.0 01/15/2015   CHOLHDL 3 01/15/2015   Lab Results  Component Value Date   ALT 16 02/10/2015   AST 18 02/10/2015   ALKPHOS 109 02/10/2015   BILITOT 0.4 02/10/2015         Hypertension    Elevated today.  Reviewed list of meds, patient is not taking OTC meds that could be causing,. It.  Have asked patient to recheck bp at home a minimum of 5 times over the next 4 weeks and call readings to office for adjustment of medications.        Gout    Patient is compliant with allopurinol per her report. I advised her to continue this.  Lab Results  Component Value Date   LABURIC 6.4 01/16/2016         Relevant Orders   Uric acid (Completed)   Obsessive compulsive disorder    Symptoms managed with zoloft.  No changes today       Obesity    I have addressed  BMI and recommended wt loss of 10% of body weight over the next 6 months using a low glycemic index diet and regular exercise a minimum of 5 days per week.        Dysuria    Recurrent , with normal UA last check in February.  Suspect atrophic vagnitis      Post-menopausal atrophic vaginitis    By history  And exam . Has been off of HRT since having PMB in 2014 .  Can resume vaginal estrogen       Encounter for preventive health examination    Annual comprehensive preventive exam was done as well as an evaluation and management of chronic conditions .  During the course of the visit the patient was educated and counseled about appropriate screening and preventive services including :  diabetes screening, lipid analysis with projected  10 year  risk for CAD , nutrition  counseling, breast, cervical and colorectal cancer screening, and recommended immunizations.  Printed recommendations for health maintenance screenings was given      RESOLVED: Osteoporosis    By recent DEXA. Will recommend alendronate.       Postmenopausal vaginal bleeding  Relevant Medications   conjugated estrogens (PREMARIN) vaginal cream (Start on 01/19/2016)      I have discontinued Ms. Terry's omeprazole, clobetasol cream, triamcinolone ointment, cefUROXime, predniSONE, and nystatin. I am also having her start on conjugated estrogens. Additionally, I am having her maintain her fluticasone, simvastatin, pantoprazole, meloxicam, sertraline, losartan-hydrochlorothiazide, and allopurinol.  Meds ordered this encounter  Medications  . conjugated estrogens (PREMARIN) vaginal cream    Sig: Place 1 Applicatorful vaginally 2 (two) times a week.    Dispense:  42.5 g    Refill:  12    Medications Discontinued During This Encounter  Medication Reason  . cefUROXime (CEFTIN) 250 MG tablet Patient has not taken in last 30 days  . clobetasol cream (TEMOVATE) 0.05 % Patient has not taken in last 30 days  . nystatin (MYCOSTATIN) 100000 UNIT/ML suspension Patient has not taken in last 30 days  . omeprazole (PRILOSEC) 20 MG capsule Patient has not taken in last 30 days  . predniSONE (DELTASONE) 10 MG tablet Patient has not taken in last 30 days  . triamcinolone ointment (KENALOG) 0.1 % Patient has not taken in last 30 days    Follow-up: No Follow-up on file.   Crecencio Mc, MD

## 2016-01-16 NOTE — Patient Instructions (Signed)
You are taking simvastatin to prevent heart attacks and strokes.  If you want to suspend it, we can recaluulate your risk of these events after 3 months if you will return for a fasting lipid panel at that time.   Your recurrent "burning" in your vagina is not likely due to infection, but due to "post menopausal atrophic vaginitis."  Resuming the premarin cream every 3-4 days (2 times per week) should help  You can submit a urine specimen the next time you have an episode that feels like a urinary tract infection  You need to see me every 6 months for blood pressure management  I would like you to lose weight.  Gaol is 175 lbs to get your BMI below the "Obesity" level (BMI < 30).  Low glycemicn index diet and REGULAR PARTICIPATION IN EXERCISE FOR 30 MINUTES DAILY IS HOW YOU DO IT  Atrophic Vaginitis Atrophic vaginitis is a condition in which the tissues that line the vagina become dry and thin. This condition is most common in women who have stopped having regular menstrual periods (menopause). This usually starts when a woman is 45-55 years old. Estrogen helps to keep the vagina moist. It stimulates the vagina to produce a clear fluid that lubricates the vagina for sexual intercourse. This fluid also protects the vagina from infection. Lack of estrogen can cause the lining of the vagina to get thinner and dryer. The vagina may also shrink in size. It may become less elastic. Atrophic vaginitis tends to get worse over time as a woman's estrogen level drops. CAUSES This condition is caused by the normal drop in estrogen that happens around the time of menopause. RISK FACTORS Certain conditions or situations may lower a woman's estrogen level, which increases her risk of atrophic vaginitis. These include:  Taking medicine that blocks estrogen.  Having ovaries removed surgically.  Being treated for cancer with X-ray treatment (radiation) or medicines (chemotherapy).  Exercising very hard and  often.  Having an eating disorder (anorexia).  Giving birth or breastfeeding.  Being over the age of 50.  Smoking. SYMPTOMS Symptoms of this condition include:  Pain, soreness, or bleeding during sexual intercourse (dyspareunia).  Vaginal burning, irritation, or itching.  Pain or bleeding during a vaginal examination using a speculum (pelvic exam).  Loss of interest in sexual activity.  Having burning pain when passing urine.  Vaginal discharge that is brown or yellow. In some cases, there are no symptoms. DIAGNOSIS This condition is diagnosed with a medical history and physical exam. This will include a pelvic exam that checks whether the inside of your vagina appears pale, thin, or dry. Rarely, you may also have other tests, including:  A urine test.  A test that checks the acid balance in your vaginal fluid (acid balance test). TREATMENT Treatment for this condition may depend on the severity of your symptoms. Treatment may include:  Using an over-the-counter vaginal lubricant before you have sexual intercourse.  Using a long-acting vaginal moisturizer.  Using low-dose vaginal estrogen for moderate to severe symptoms that do not respond to other treatments. Options include creams, tablets, and inserts (vaginal rings). Before using vaginal estrogen, tell your health care provider if you have a history of:  Breast cancer.  Endometrial cancer.  Blood clots.  Taking medicines. You may be able to take a daily pill for dyspareunia. Discuss all of the risks of this medicine with your health care provider. It is usually not recommended for women who have a family history   or personal history of breast cancer. If your symptoms are very mild and you are not sexually active, you may not need treatment. HOME CARE INSTRUCTIONS  Take medicines only as directed by your health care provider. Do not use herbal or alternative medicines unless your health care provider says that you  can.  Use over-the-counter creams, lubricants, or moisturizers for dryness only as directed by your health care provider.  If your atrophic vaginitis is caused by menopause, discuss all of your menopausal symptoms and treatment options with your health care provider.  Do not douche.  Do not use products that can make your vagina dry. These include:  Scented feminine sprays.  Scented tampons.  Scented soaps.  If it hurts to have sex, talk with your sexual partner. SEEK MEDICAL CARE IF:  Your discharge looks different than normal.  Your vagina has an unusual smell.  You have new symptoms.  Your symptoms do not improve with treatment.  Your symptoms get worse.   This information is not intended to replace advice given to you by your health care provider. Make sure you discuss any questions you have with your health care provider.   Document Released: 07/09/2014 Document Reviewed: 07/09/2014 Elsevier Interactive Patient Education [employer].  Menopause is a normal process in which your reproductive ability comes to an end. This process happens gradually over a span of months to years, usually between the ages of 75 and 81. Menopause is complete when you have missed 12 consecutive menstrual periods. It is important to talk with your health care provider about some of the most common conditions that affect postmenopausal women, such as heart disease, cancer, and bone loss (osteoporosis). Adopting a healthy lifestyle and getting preventive care can help to promote your health and wellness. Those actions can also lower your chances of developing some of these common conditions. WHAT SHOULD I KNOW ABOUT MENOPAUSE? During menopause, you may experience a number of symptoms, such as:  Moderate-to-severe hot flashes.  Night sweats.  Decrease in sex drive.  Mood swings.  Headaches.  Tiredness.  Irritability.  Memory problems.  Insomnia. Choosing to treat or not to  treat menopausal changes is an individual decision that you make with your health care provider. WHAT SHOULD I KNOW ABOUT HORMONE REPLACEMENT THERAPY AND SUPPLEMENTS? Hormone therapy products are effective for treating symptoms that are associated with menopause, such as hot flashes and night sweats. Hormone replacement carries certain risks, especially as you become older. If you are thinking about using estrogen or estrogen with progestin treatments, discuss the benefits and risks with your health care provider. WHAT SHOULD I KNOW ABOUT HEART DISEASE AND STROKE? Heart disease, heart attack, and stroke become more likely as you age. This may be due, in part, to the hormonal changes that your body experiences during menopause. These can affect how your body processes dietary fats, triglycerides, and cholesterol. Heart attack and stroke are both medical emergencies. There are many things that you can do to help prevent heart disease and stroke:  Have your blood pressure checked at least every 1-2 years. High blood pressure causes heart disease and increases the risk of stroke.  If you are 36-82 years old, ask your health care provider if you should take aspirin to prevent a heart attack or a stroke.  Do not use any tobacco products, including cigarettes, chewing tobacco, or electronic cigarettes. If you need help quitting, ask your health care provider.  It is important to eat a healthy diet  and maintain a healthy weight.  Be sure to include plenty of vegetables, fruits, low-fat dairy products, and lean protein.  Avoid eating foods that are high in solid fats, added sugars, or salt (sodium).  Get regular exercise. This is one of the most important things that you can do for your health.  Try to exercise for at least 150 minutes each week. The type of exercise that you do should increase your heart rate and make you sweat. This is known as moderate-intensity exercise.  Try to do strengthening  exercises at least twice each week. Do these in addition to the moderate-intensity exercise.  Know your numbers.Ask your health care provider to check your cholesterol and your blood glucose. Continue to have your blood tested as directed by your health care provider. WHAT SHOULD I KNOW ABOUT CANCER SCREENING? There are several types of cancer. Take the following steps to reduce your risk and to catch any cancer development as early as possible. Breast Cancer  Practice breast self-awareness.  This means understanding how your breasts normally appear and feel.  It also means doing regular breast self-exams. Let your health care provider know about any changes, no matter how small.  If you are 85 or older, have a clinician do a breast exam (clinical breast exam or CBE) every year. Depending on your age, family history, and medical history, it may be recommended that you also have a yearly breast X-ray (mammogram).  If you have a family history of breast cancer, talk with your health care provider about genetic screening.  If you are at high risk for breast cancer, talk with your health care provider about having an MRI and a mammogram every year.  Breast cancer (BRCA) gene test is recommended for women who have family members with BRCA-related cancers. Results of the assessment will determine the need for genetic counseling and BRCA1 and for BRCA2 testing. BRCA-related cancers include these types:  Breast. This occurs in males or females.  Ovarian.  Tubal. This may also be called fallopian tube cancer.  Cancer of the abdominal or pelvic lining (peritoneal cancer).  Prostate.  Pancreatic. Cervical, Uterine, and Ovarian Cancer Your health care provider may recommend that you be screened regularly for cancer of the pelvic organs. These include your ovaries, uterus, and vagina. This screening involves a pelvic exam, which includes checking for microscopic changes to the surface of your  cervix (Pap test).  For women ages 21-65, health care providers may recommend a pelvic exam and a Pap test every three years. For women ages 44-65, they may recommend the Pap test and pelvic exam, combined with testing for human papilloma virus (HPV), every five years. Some types of HPV increase your risk of cervical cancer. Testing for HPV may also be done on women of any age who have unclear Pap test results.  Other health care providers may not recommend any screening for nonpregnant women who are considered low risk for pelvic cancer and have no symptoms. Ask your health care provider if a screening pelvic exam is right for you.  If you have had past treatment for cervical cancer or a condition that could lead to cancer, you need Pap tests and screening for cancer for at least 20 years after your treatment. If Pap tests have been discontinued for you, your risk factors (such as having a new sexual partner) need to be reassessed to determine if you should start having screenings again. Some women have medical problems that increase the chance  of getting cervical cancer. In these cases, your health care provider may recommend that you have screening and Pap tests more often.  If you have a family history of uterine cancer or ovarian cancer, talk with your health care provider about genetic screening.  If you have vaginal bleeding after reaching menopause, tell your health care provider.  There are currently no reliable tests available to screen for ovarian cancer. Lung Cancer Lung cancer screening is recommended for adults 72-52 years old who are at high risk for lung cancer because of a history of smoking. A yearly low-dose CT scan of the lungs is recommended if you:  Currently smoke.  Have a history of at least 30 pack-years of smoking and you currently smoke or have quit within the past 15 years. A pack-year is smoking an average of one pack of cigarettes per day for one year. Yearly  screening should:  Continue until it has been 15 years since you quit.  Stop if you develop a health problem that would prevent you from having lung cancer treatment. Colorectal Cancer  This type of cancer can be detected and can often be prevented.  Routine colorectal cancer screening usually begins at age 80 and continues through age 30.  If you have risk factors for colon cancer, your health care provider may recommend that you be screened at an earlier age.  If you have a family history of colorectal cancer, talk with your health care provider about genetic screening.  Your health care provider may also recommend using home test kits to check for hidden blood in your stool.  A small camera at the end of a tube can be used to examine your colon directly (sigmoidoscopy or colonoscopy). This is done to check for the earliest forms of colorectal cancer.  Direct examination of the colon should be repeated every 5-10 years until age 42. However, if early forms of precancerous polyps or small growths are found or if you have a family history or genetic risk for colorectal cancer, you may need to be screened more often. Skin Cancer  Check your skin from head to toe regularly.  Monitor any moles. Be sure to tell your health care provider:  About any new moles or changes in moles, especially if there is a change in a mole's shape or color.  If you have a mole that is larger than the size of a pencil eraser.  If any of your family members has a history of skin cancer, especially at a young age, talk with your health care provider about genetic screening.  Always use sunscreen. Apply sunscreen liberally and repeatedly throughout the day.  Whenever you are outside, protect yourself by wearing long sleeves, pants, a wide-brimmed hat, and sunglasses. WHAT SHOULD I KNOW ABOUT OSTEOPOROSIS? Osteoporosis is a condition in which bone destruction happens more quickly than new bone creation. After  menopause, you may be at an increased risk for osteoporosis. To help prevent osteoporosis or the bone fractures that can happen because of osteoporosis, the following is recommended:  If you are 1-59 years old, get at least 1,000 mg of calcium and at least 600 mg of vitamin D per day.  If you are older than age 24 but younger than age 62, get at least 1,200 mg of calcium and at least 600 mg of vitamin D per day.  If you are older than age 91, get at least 1,200 mg of calcium and at least 800 mg of vitamin D  per day. Smoking and excessive alcohol intake increase the risk of osteoporosis. Eat foods that are rich in calcium and vitamin D, and do weight-bearing exercises several times each week as directed by your health care provider. WHAT SHOULD I KNOW ABOUT HOW MENOPAUSE AFFECTS [place]? Depression may occur at any age, but it is more common as you become older. Common symptoms of depression include:  Low or sad mood.  Changes in sleep patterns.  Changes in appetite or eating patterns.  Feeling an overall lack of motivation or enjoyment of activities that you previously enjoyed.  Frequent crying spells. Talk with your health care provider if you think that you are experiencing depression. WHAT SHOULD I KNOW ABOUT IMMUNIZATIONS? It is important that you get and maintain your immunizations. These include:  Tetanus, diphtheria, and pertussis (Tdap) booster vaccine.  Influenza every year before the flu season begins.  Pneumonia vaccine.  Shingles vaccine. Your health care provider may also recommend other immunizations.   This information is not intended to replace advice given to you by your health care provider. Make sure you discuss any questions you have with your health care provider.   Document Released: 04/16/2005 Document Revised: 03/15/2014 Document Reviewed: 10/25/2013 Elsevier Interactive Patient Education [employer].

## 2016-01-16 NOTE — Progress Notes (Signed)
Pre visit review using our clinic review tool, if applicable. No additional management support is needed unless otherwise documented below in the visit note. 

## 2016-01-18 DIAGNOSIS — N952 Postmenopausal atrophic vaginitis: Secondary | ICD-10-CM | POA: Insufficient documentation

## 2016-01-18 DIAGNOSIS — Z0000 Encounter for general adult medical examination without abnormal findings: Secondary | ICD-10-CM | POA: Insufficient documentation

## 2016-01-18 DIAGNOSIS — M810 Age-related osteoporosis without current pathological fracture: Secondary | ICD-10-CM | POA: Insufficient documentation

## 2016-01-18 NOTE — Assessment & Plan Note (Signed)
Annual comprehensive preventive exam was done as well as an evaluation and management of chronic conditions .  During the course of the visit the patient was educated and counseled about appropriate screening and preventive services including :  diabetes screening, lipid analysis with projected  10 year  risk for CAD , nutrition counseling, breast, cervical and colorectal cancer screening, and recommended immunizations.  Printed recommendations for health maintenance screenings was given 

## 2016-01-18 NOTE — Assessment & Plan Note (Signed)
By recent DEXA. Will recommend alendronate.

## 2016-01-18 NOTE — Assessment & Plan Note (Signed)
Recurrent , with normal UA last check in February.  Suspect atrophic vagnitis

## 2016-01-18 NOTE — Assessment & Plan Note (Signed)
By history  And exam . Has been off of HRT since having PMB in 2014 .  Can resume vaginal estrogen

## 2016-01-18 NOTE — Assessment & Plan Note (Addendum)
LDL and triglycerides  Have been at goal on current medications, but she has not had fasting labs or liver ezymes in over a year  She has no known side effects .   She wants to suspend the medication and return after 3 months for reassessment without therapy.  Lab Results  Component Value Date   CHOL 188 01/15/2015   HDL 74.90 01/15/2015   LDLCALC 100 (H) 01/15/2015   LDLDIRECT 93.0 01/15/2015   TRIG 70.0 01/15/2015   CHOLHDL 3 01/15/2015   Lab Results  Component Value Date   ALT 16 02/10/2015   AST 18 02/10/2015   ALKPHOS 109 02/10/2015   BILITOT 0.4 02/10/2015

## 2016-01-18 NOTE — Assessment & Plan Note (Signed)
Symptoms managed with zoloft.  No changes today

## 2016-01-18 NOTE — Assessment & Plan Note (Signed)
Elevated today.  Reviewed list of meds, patient is not taking OTC meds that could be causing,. It.  Have asked patient to recheck bp at home a minimum of 5 times over the next 4 weeks and call readings to office for adjustment of medications.   

## 2016-01-18 NOTE — Assessment & Plan Note (Addendum)
Patient is compliant with allopurinol per her report. I advised her to continue this.  Lab Results  Component Value Date   LABURIC 6.4 01/16/2016

## 2016-01-18 NOTE — Assessment & Plan Note (Signed)
I have addressed  BMI and recommended wt loss of 10% of body weight over the next 6 months using a low glycemic index diet and regular exercise a minimum of 5 days per week.   

## 2016-01-19 ENCOUNTER — Other Ambulatory Visit: Payer: Self-pay | Admitting: Internal Medicine

## 2016-01-21 ENCOUNTER — Other Ambulatory Visit (INDEPENDENT_AMBULATORY_CARE_PROVIDER_SITE_OTHER): Payer: Commercial Managed Care - HMO

## 2016-01-21 DIAGNOSIS — E785 Hyperlipidemia, unspecified: Secondary | ICD-10-CM

## 2016-01-21 DIAGNOSIS — R5383 Other fatigue: Secondary | ICD-10-CM

## 2016-01-21 DIAGNOSIS — E559 Vitamin D deficiency, unspecified: Secondary | ICD-10-CM

## 2016-01-21 DIAGNOSIS — I10 Essential (primary) hypertension: Secondary | ICD-10-CM | POA: Diagnosis not present

## 2016-01-21 LAB — COMPREHENSIVE METABOLIC PANEL
ALBUMIN: 4.4 g/dL (ref 3.5–5.2)
ALK PHOS: 108 U/L (ref 39–117)
ALT: 16 U/L (ref 0–35)
AST: 18 U/L (ref 0–37)
BUN: 26 mg/dL — AB (ref 6–23)
CALCIUM: 9.8 mg/dL (ref 8.4–10.5)
CHLORIDE: 107 meq/L (ref 96–112)
CO2: 27 mEq/L (ref 19–32)
Creatinine, Ser: 1 mg/dL (ref 0.40–1.20)
GFR: 56.96 mL/min — AB (ref >60.00)
Glucose, Bld: 87 mg/dL (ref 70–99)
POTASSIUM: 4.1 meq/L (ref 3.5–5.1)
SODIUM: 142 meq/L (ref 135–145)
Total Bilirubin: 0.4 mg/dL (ref 0.2–1.2)
Total Protein: 6.8 g/dL (ref 6.0–8.3)

## 2016-01-21 LAB — VITAMIN D 25 HYDROXY (VIT D DEFICIENCY, FRACTURES): VITD: 27.02 ng/mL — ABNORMAL LOW (ref 30.00–100.00)

## 2016-01-21 LAB — CBC WITH DIFFERENTIAL/PLATELET
BASOS PCT: 0.5 % (ref 0.0–3.0)
Basophils Absolute: 0 10*3/uL (ref 0.0–0.1)
EOS PCT: 2.4 % (ref 0.0–5.0)
Eosinophils Absolute: 0.2 10*3/uL (ref 0.0–0.7)
HEMATOCRIT: 37.2 % (ref 36.0–46.0)
HEMOGLOBIN: 12.4 g/dL (ref 12.0–15.0)
LYMPHS PCT: 25 % (ref 12.0–46.0)
Lymphs Abs: 1.6 10*3/uL (ref 0.7–4.0)
MCHC: 33.4 g/dL (ref 30.0–36.0)
MCV: 85.8 fl (ref 78.0–100.0)
MONOS PCT: 6.6 % (ref 3.0–12.0)
Monocytes Absolute: 0.4 10*3/uL (ref 0.1–1.0)
NEUTROS ABS: 4.1 10*3/uL (ref 1.4–7.7)
Neutrophils Relative %: 65.5 % (ref 43.0–77.0)
PLATELETS: 195 10*3/uL (ref 150.0–400.0)
RBC: 4.33 Mil/uL (ref 3.87–5.11)
RDW: 15.3 % (ref 11.5–15.5)
WBC: 6.3 10*3/uL (ref 4.0–10.5)

## 2016-01-21 LAB — LIPID PANEL
CHOLESTEROL: 198 mg/dL (ref 0–200)
HDL: 76.8 mg/dL (ref >39.00)
LDL Cholesterol: 102 mg/dL — ABNORMAL HIGH (ref 0–99)
NonHDL: 121.41
TRIGLYCERIDES: 96 mg/dL (ref 0.0–149.0)
Total CHOL/HDL Ratio: 3
VLDL: 19.2 mg/dL (ref 0.0–40.0)

## 2016-01-21 LAB — TSH: TSH: 1.63 u[IU]/mL (ref 0.35–4.50)

## 2016-01-21 LAB — LDL CHOLESTEROL, DIRECT: LDL DIRECT: 102 mg/dL

## 2016-01-26 ENCOUNTER — Encounter: Payer: Self-pay | Admitting: *Deleted

## 2016-01-27 ENCOUNTER — Telehealth: Payer: Self-pay | Admitting: *Deleted

## 2016-01-27 NOTE — Telephone Encounter (Signed)
Left message to return call to office.

## 2016-01-27 NOTE — Telephone Encounter (Signed)
Pt has requested lab results  Pt contact [phone]

## 2016-01-28 ENCOUNTER — Other Ambulatory Visit: Payer: Self-pay | Admitting: Internal Medicine

## 2016-01-28 NOTE — Telephone Encounter (Signed)
Labs were mailed to patient. thanks

## 2016-01-28 NOTE — Telephone Encounter (Signed)
Labs mailed

## 2016-01-28 NOTE — Telephone Encounter (Signed)
Pt requested lab results  She can be reached at [phone]

## 2016-02-04 ENCOUNTER — Other Ambulatory Visit: Payer: Self-pay | Admitting: Internal Medicine

## 2016-02-10 ENCOUNTER — Ambulatory Visit (INDEPENDENT_AMBULATORY_CARE_PROVIDER_SITE_OTHER): Payer: Commercial Managed Care - HMO | Admitting: Vascular Surgery

## 2016-02-17 ENCOUNTER — Other Ambulatory Visit: Payer: Self-pay | Admitting: Internal Medicine

## 2016-02-23 DIAGNOSIS — H43813 Vitreous degeneration, bilateral: Secondary | ICD-10-CM | POA: Diagnosis not present

## 2016-02-23 DIAGNOSIS — H04123 Dry eye syndrome of bilateral lacrimal glands: Secondary | ICD-10-CM | POA: Diagnosis not present

## 2016-03-29 ENCOUNTER — Telehealth: Payer: Self-pay | Admitting: Internal Medicine

## 2016-03-29 NOTE — Telephone Encounter (Signed)
Patient stated just the big great toe and feels it is a gout flare up looks like patient has HX of gout. Does patient need to be seen?

## 2016-03-29 NOTE — Telephone Encounter (Signed)
Pt called and stated that her big toe on her rt foot has been swollen for 4 days. She wants to know if Dr. Derrel Nip could call in something for gout, she had some clam chowder and shrimp and it is not getting better. Please advise, thank you!  Call pt @ [place], [place]

## 2016-03-29 NOTE — Telephone Encounter (Signed)
If no answer please call [phone]

## 2016-03-30 MED ORDER — COLCHICINE 0.6 MG PO TABS
0.6000 mg | ORAL_TABLET | Freq: Two times a day (BID) | ORAL | 1 refills | Status: DC
Start: 2016-03-30 — End: 2016-10-27

## 2016-03-30 NOTE — Telephone Encounter (Signed)
Colchicine twice daily for 7 days,  Sent to pharmacy.  Can take tylenol for pain. If no improvement in 3-4 days needs to be seen.

## 2016-03-30 NOTE — Telephone Encounter (Signed)
Patient notified and voiced understanding.

## 2016-04-16 ENCOUNTER — Telehealth: Payer: Self-pay | Admitting: Radiology

## 2016-04-16 DIAGNOSIS — M1000 Idiopathic gout, unspecified site: Secondary | ICD-10-CM

## 2016-04-16 DIAGNOSIS — E785 Hyperlipidemia, unspecified: Secondary | ICD-10-CM

## 2016-04-16 DIAGNOSIS — I10 Essential (primary) hypertension: Secondary | ICD-10-CM

## 2016-04-16 NOTE — Telephone Encounter (Signed)
Pt coming in for labs Monday, please place orders. Thank you.

## 2016-04-17 NOTE — Addendum Note (Signed)
Addended by: Crecencio Mc on: 04/17/2016 06:45 AM   Modules accepted: Orders

## 2016-04-17 NOTE — Telephone Encounter (Addendum)
She is not due for fasting  labs until May per last lab result. Labs ordered, ,but if you can reach her,  Tell her so

## 2016-04-19 ENCOUNTER — Other Ambulatory Visit: Payer: Commercial Managed Care - HMO

## 2016-04-20 ENCOUNTER — Ambulatory Visit (INDEPENDENT_AMBULATORY_CARE_PROVIDER_SITE_OTHER): Payer: Medicare Other

## 2016-04-20 ENCOUNTER — Other Ambulatory Visit (INDEPENDENT_AMBULATORY_CARE_PROVIDER_SITE_OTHER): Payer: Medicare Other

## 2016-04-20 VITALS — BP 138/78 | HR 72 | Temp 97.8°F | Resp 14 | Ht 64.0 in | Wt 187.0 lb

## 2016-04-20 DIAGNOSIS — Z0000 Encounter for general adult medical examination without abnormal findings: Secondary | ICD-10-CM | POA: Diagnosis not present

## 2016-04-20 DIAGNOSIS — I10 Essential (primary) hypertension: Secondary | ICD-10-CM

## 2016-04-20 DIAGNOSIS — E785 Hyperlipidemia, unspecified: Secondary | ICD-10-CM | POA: Diagnosis not present

## 2016-04-20 DIAGNOSIS — M1000 Idiopathic gout, unspecified site: Secondary | ICD-10-CM

## 2016-04-20 LAB — COMPREHENSIVE METABOLIC PANEL
ALBUMIN: 4.3 g/dL (ref 3.5–5.2)
ALT: 13 U/L (ref 0–35)
AST: 17 U/L (ref 0–37)
Alkaline Phosphatase: 108 U/L (ref 39–117)
BUN: 24 mg/dL — ABNORMAL HIGH (ref 6–23)
CALCIUM: 9.5 mg/dL (ref 8.4–10.5)
CHLORIDE: 106 meq/L (ref 96–112)
CO2: 28 mEq/L (ref 19–32)
CREATININE: 1.16 mg/dL (ref 0.40–1.20)
GFR: 47.96 mL/min — AB (ref >60.00)
Glucose, Bld: 91 mg/dL (ref 70–99)
Potassium: 4.7 mEq/L (ref 3.5–5.1)
Sodium: 140 mEq/L (ref 135–145)
Total Bilirubin: 0.4 mg/dL (ref 0.2–1.2)
Total Protein: 6.3 g/dL (ref 6.0–8.3)

## 2016-04-20 LAB — MICROALBUMIN / CREATININE URINE RATIO
CREATININE, U: 214.6 mg/dL
Microalb Creat Ratio: 0.3 mg/g (ref 0.0–30.0)

## 2016-04-20 LAB — LDL CHOLESTEROL, DIRECT: Direct LDL: 171 mg/dL

## 2016-04-20 LAB — LIPID PANEL
CHOLESTEROL: 280 mg/dL — AB (ref 0–200)
HDL: 70.8 mg/dL (ref >39.00)
LDL CALC: 182 mg/dL — AB (ref 0–99)
NonHDL: 209.17
TRIGLYCERIDES: 135 mg/dL (ref 0.0–149.0)
Total CHOL/HDL Ratio: 4
VLDL: 27 mg/dL (ref 0.0–40.0)

## 2016-04-20 LAB — URIC ACID: URIC ACID, SERUM: 6 mg/dL (ref 2.4–7.0)

## 2016-04-20 NOTE — Progress Notes (Signed)
Subjective:   Jamie Anderson is a 81 y.o. female who presents for Medicare Annual (Subsequent) preventive examination.  Review of Systems:  No ROS.  Medicare Wellness Visit.  Cardiac Risk Factors include: advanced age (>64men, >5 women);hypertension;obesity (BMI >30kg/m2)     Objective:     Vitals: BP 138/78 (BP Location: Left Arm, Patient Position: Sitting, Cuff Size: Normal)   Pulse 72   Temp 97.8 F (36.6 C) (Oral)   Resp 14   Ht 5\' 4"  (1.626 m)   Wt 187 lb (84.8 kg)   SpO2 98%   BMI 32.10 kg/m   Body mass index is 32.1 kg/m.   Tobacco History  Smoking Status  . Never Smoker  Smokeless Tobacco  . Never Used     Counseling given: Not Answered   Past Medical History:  Diagnosis Date  . Granuloma annulare   . Hyperlipidemia   . Hypertension   . Menopause    Past Surgical History:  Procedure Laterality Date  . APPENDECTOMY    . DILATION AND CURETTAGE OF UTERUS    . RIGHT OOPHORECTOMY Right feb 2014   [name]  . [place]  2010  . VENOUS ABLATION     bilateral   Family History  Problem Relation Age of Onset  . Early death Mother     durig childbirth  . Heart disease Father   . Heart disease Sister   . Heart disease Brother   . Cancer Brother    History  Sexual Activity  . Sexual activity: Not Currently    Outpatient Encounter Prescriptions as of 04/20/2016  Medication Sig  . allopurinol (ZYLOPRIM) 100 MG tablet TAKE 1 TABLET EVERY DAY  . colchicine 0.6 MG tablet Take 1 tablet (0.6 mg total) by mouth 2 (two) times daily.  Marland Kitchen conjugated estrogens (PREMARIN) vaginal cream Place 1 Applicatorful vaginally 2 (two) times a week.  . fluticasone (FLONASE) 50 MCG/ACT nasal spray USE 2 SPRAYS IN EACH NOSTRIL EVERY DAY  . losartan-hydrochlorothiazide (HYZAAR) 50-12.5 MG tablet TAKE 2 TABLETS EVERY DAY  . meloxicam (MOBIC) 15 MG tablet TAKE 1 TABLET EVERY DAY  . pantoprazole (PROTONIX) 40 MG tablet TAKE 1 TABLET EVERY DAY  . Probiotic Product  (PROBIOTIC DAILY PO) Take by mouth.  . sertraline (ZOLOFT) 100 MG tablet TAKE 1 TABLET EVERY DAY  (REPLACES  CITALOPRAM)  . simvastatin (ZOCOR) 40 MG tablet TAKE 1 TABLET AT BEDTIME (Patient not taking: Reported on 04/20/2016)   No facility-administered encounter medications on file as of 04/20/2016.     Activities of Daily Living In your present state of health, do you have any difficulty performing the following activities: 04/20/2016  Hearing? N  Vision? N  Difficulty concentrating or making decisions? N  Walking or climbing stairs? Y  Dressing or bathing? N  Doing errands, shopping? N  Preparing Food and eating ? N  Using the Toilet? N  In the past six months, have you accidently leaked urine? N  Do you have problems with loss of bowel control? N  Managing your Medications? N  Managing your Finances? N  Housekeeping or managing your Housekeeping? N  Some recent data might be hidden    Patient Care Team: Crecencio Mc, MD as PCP - General (Internal Medicine)    Assessment:    This is a routine wellness examination for [place]. The goal of the wellness visit is to assist the patient how to close the gaps in care and create a preventative care plan  for the patient.   Taking calcium VIT D as appropriate/Osteoporosis reviewed.  Medications reviewed; taking without issues or barriers.  Safety issues reviewed; smoke detectors in the home. No firearms in the home. Wears seatbelts when driving or riding with others. No violence in the home.  No identified risk were noted; The patient was oriented x 3; appropriate in dress and manner and no objective failures at ADL's or IADL's.   BMI; discussed the importance of a healthy diet, water intake and exercise. Educational material provided.  HTN; followed by PCP.  Health maintenance gaps; closed.  Patient Concerns: None at this time. Follow up with PCP as needed.  Exercise Activities and Dietary recommendations Current  Exercise Habits: Home exercise routine, Type of exercise: walking, Time (Minutes): 20, Frequency (Times/Week): 1, Weekly Exercise (Minutes/Week): 20, Intensity: Mild  Goals    . Increase physical activity          Join the gym  Walk for exercise 30 minutes daily, 3 days a week    . Increase water intake          Stay hydrated and drink plenty of fluids/water      Fall Risk Fall Risk  04/20/2016 01/16/2016 01/15/2015 01/03/2014 04/13/2012  Falls in the past year? No No No No No   Depression Screen PHQ 2/9 Scores 04/20/2016 01/16/2016 01/15/2015 01/03/2014  PHQ - 2 Score 0 0 0 0     Cognitive Function MMSE - Mini Mental State Exam 04/20/2016  Orientation to time 5  Orientation to Place 5  Registration 3  Attention/ Calculation 5  Recall 3  Language- name 2 objects 2  Language- repeat 1  Language- follow 3 step command 3  Language- read & follow direction 1  Write a sentence 1  Copy design 1  Total score 30        Immunization History  Administered Date(s) Administered  . Influenza Split 11/25/2011  . Influenza,inj,Quad PF,36+ Mos 11/22/2012  . Influenza-Unspecified 12/20/2013, 01/07/2015, 01/09/2016  . Pneumococcal Conjugate-13 01/03/2014  . Pneumococcal Polysaccharide-23 11/25/2011  . Tdap 12/06/2013  . Zoster 12/29/2009   Screening Tests Health Maintenance  Topic Date Due  . TETANUS/TDAP  12/07/2023  . INFLUENZA VACCINE  Completed  . DEXA SCAN  Completed  . ZOSTAVAX  Completed  . PNA vac Low Risk Adult  Completed      Plan:    End of life planning; Advance aging; Advanced directives discussed. Copy of current HCPOA/Living Will requested.  Medicare Attestation I have personally reviewed: The patient's medical and social history Their use of alcohol, tobacco or illicit drugs Their current medications and supplements The patient's functional ability including ADLs,fall risks, home safety risks, cognitive, and hearing and visual impairment Diet and physical  activities Evidence for depression   The patient's weight, height, BMI, and visual acuity have been recorded in the chart.  I have made referrals and provided education to the patient based on review of the above and I have provided the patient with a written personalized care plan for preventive services.    During the course of the visit the patient was educated and counseled about the following appropriate screening and preventive services:   Vaccines to include Pneumoccal, Influenza, Hepatitis B, Td, Zostavax, HCV  Electrocardiogram  Cardiovascular Disease  Colorectal cancer screening  Bone density screening  Diabetes screening  Glaucoma screening  Mammography/PAP  Nutrition counseling   Patient Instructions (the written plan) was given to the patient.   OBrien-Blaney, Denisa L, LPN  04/20/2016     

## 2016-04-20 NOTE — Patient Instructions (Addendum)
  Jamie Anderson , Thank you for taking time to come for your Medicare Wellness Visit. I appreciate your ongoing commitment to your health goals. Please review the following plan we discussed and let me know if I can assist you in the future.   These are the goals we discussed: Goals    . Increase physical activity          Join the gym  Walk for exercise 30 minutes daily, 3 days a week    . Increase water intake          Stay hydrated and drink plenty of fluids/water       This is a list of the screening recommended for you and due dates:  Health Maintenance  Topic Date Due  . Tetanus Vaccine  12/07/2023  . Flu Shot  Completed  . DEXA scan (bone density measurement)  Completed  . Shingles Vaccine  Completed  . Pneumonia vaccines  Completed

## 2016-04-22 NOTE — Progress Notes (Signed)
  I have reviewed the above information and agree with above.   Fontella Shan, MD 

## 2016-04-23 ENCOUNTER — Telehealth: Payer: Self-pay | Admitting: Internal Medicine

## 2016-04-23 NOTE — Telephone Encounter (Signed)
Pt called requesting lab results. Thank you!  Call pt @ [phone]

## 2016-06-07 ENCOUNTER — Encounter: Payer: Self-pay | Admitting: Family

## 2016-06-07 ENCOUNTER — Ambulatory Visit (INDEPENDENT_AMBULATORY_CARE_PROVIDER_SITE_OTHER): Payer: Medicare Other | Admitting: Family

## 2016-06-07 VITALS — BP 144/76 | HR 92 | Temp 98.1°F | Ht 64.0 in

## 2016-06-07 DIAGNOSIS — M25561 Pain in right knee: Secondary | ICD-10-CM | POA: Diagnosis not present

## 2016-06-07 DIAGNOSIS — M1711 Unilateral primary osteoarthritis, right knee: Secondary | ICD-10-CM | POA: Diagnosis not present

## 2016-06-07 MED ORDER — SERTRALINE HCL 100 MG PO TABS: ORAL_TABLET | ORAL | 1 refills | Status: DC

## 2016-06-07 MED ORDER — MELOXICAM 15 MG PO TABS: 15.0000 mg | ORAL_TABLET | Freq: Every day | ORAL | 0 refills | Status: DC

## 2016-06-07 NOTE — Progress Notes (Signed)
Subjective:    Patient ID: Jamie Anderson, female    DOB: 01/12/1936, 81 y.o.   MRN: [id number]  CC: Jamie Anderson is a 81 y.o. female who presents today for an acute visit.    HPI: CC: right knee pain for 10 days, unchanged. Think she got out of bed and 'may have twisted.' Swollen. No fever, chills. No injury. Using icy hot and ice with no relief. Pain worse with trying to cross legs or when walking. Feels stiff.   No hip pain.   Requesting refills for mobic and zoloft    HISTORY:  Past Medical History:  Diagnosis Date  . Granuloma annulare   . Hyperlipidemia   . Hypertension   . Menopause    Past Surgical History:  Procedure Laterality Date  . APPENDECTOMY    . DILATION AND CURETTAGE OF UTERUS    . RIGHT OOPHORECTOMY Right feb 2014   [name]  . [place]  2010  . VENOUS ABLATION     bilateral   Family History  Problem Relation Age of Onset  . Early death Mother     durig childbirth  . Heart disease Father   . Heart disease Sister   . Heart disease Brother   . Cancer Brother     Allergies: Biaxin [clarithromycin]; Corn-containing products; Dust mite extract; Gluten meal; Oat; and Shrimp [shellfish allergy] Current Outpatient Prescriptions on File Prior to Visit  Medication Sig Dispense Refill  . allopurinol (ZYLOPRIM) 100 MG tablet TAKE 1 TABLET EVERY DAY 90 tablet 1  . colchicine 0.6 MG tablet Take 1 tablet (0.6 mg total) by mouth 2 (two) times daily. 30 tablet 1  . conjugated estrogens (PREMARIN) vaginal cream Place 1 Applicatorful vaginally 2 (two) times a week. 42.5 g 12  . fluticasone (FLONASE) 50 MCG/ACT nasal spray USE 2 SPRAYS IN EACH NOSTRIL EVERY DAY 48 g 3  . losartan-hydrochlorothiazide (HYZAAR) 50-12.5 MG tablet TAKE 2 TABLETS EVERY DAY 180 tablet 1  . meloxicam (MOBIC) 15 MG tablet TAKE 1 TABLET EVERY DAY 90 tablet 1  . pantoprazole (PROTONIX) 40 MG tablet TAKE 1 TABLET EVERY DAY 90 tablet 3  . Probiotic Product (PROBIOTIC DAILY PO) Take by  mouth.    . sertraline (ZOLOFT) 100 MG tablet TAKE 1 TABLET EVERY DAY  (REPLACES  CITALOPRAM) 90 tablet 1  . simvastatin (ZOCOR) 40 MG tablet TAKE 1 TABLET AT BEDTIME 90 tablet 1   No current facility-administered medications on file prior to visit.     Social History  Substance Use Topics  . Smoking status: Never Smoker  . Smokeless tobacco: Never Used  . Alcohol use 2.4 oz/week    4 Glasses of wine per week    Review of Systems  Constitutional: Negative for chills and fever.  Respiratory: Negative for cough.   Cardiovascular: Negative for chest pain and palpitations.  Gastrointestinal: Negative for nausea and vomiting.  Musculoskeletal: Positive for joint swelling.      Objective:    BP (!) 144/76   Pulse 92   Temp 98.1 F (36.7 C) (Oral)   Ht 5\' 4"  (1.626 m)   SpO2 95%    Physical Exam  Constitutional: She appears well-developed and well-nourished.  Eyes: Conjunctivae are normal.  Cardiovascular: Normal rate, regular rhythm, normal heart sounds and normal pulses.   Pulmonary/Chest: Effort normal and breath sounds normal. She has no wheezes. She has no rhonchi. She has no rales.  Musculoskeletal:       Right knee:  She exhibits decreased range of motion, swelling and effusion. She exhibits no ecchymosis. Tenderness found. Medial joint line tenderness noted.  Bilateral knees are symmetric. No effusion appreciated. No increase in warmth or erythema. Crepitus felt with flexion of bilateral knees.  Right knee:  Able to extend to -5 to 10 degrees. Reduction in flexion. No catching with McMurray maneuver. No patellar apprehension. Negative anterior drawer and lachman's- no laxity appreciated.  No calf tenderness of lower leg edema bilaterally.    Neurological: She is alert. She has normal reflexes.  Skin: Skin is warm and dry.  Psychiatric: She has a normal mood and affect. Her speech is normal and behavior is normal. Thought content normal.  Vitals reviewed.        Assessment & Plan:   1. Acute pain of right knee Effusion. No systemic features. Discussed with patient that I would be more comfortable if patient were to go walk in at [facility] and she may benefit from arthrocentesis. Patient agreed and decided to leave from here and go directly there.                       - sertraline (ZOLOFT) 100 MG tablet; TAKE 1 TABLET EVERY DAY  (REPLACES  CITALOPRAM)  Dispense: 90 tablet; Refill: 1 - meloxicam (MOBIC) 15 MG tablet; Take 1 tablet (15 mg total) by mouth daily.  Dispense: 90 tablet; Refill: 0    I am having [name] maintain her pantoprazole, losartan-hydrochlorothiazide, conjugated estrogens, simvastatin, sertraline, fluticasone, meloxicam, allopurinol, colchicine, and Probiotic Product (PROBIOTIC DAILY PO).   No orders of the defined types were placed in this encounter.   Return precautions given.   Risks, benefits, and alternatives of the medications and treatment plan prescribed today were discussed, and patient expressed understanding.   Education regarding symptom management and diagnosis given to patient on AVS.  Continue to follow with TULLO, Aris Everts, MD for routine health maintenance.   Jamie Anderson and I agreed with plan.   Mable Paris, FNP

## 2016-06-07 NOTE — Patient Instructions (Signed)
As discussed, please go directly to [facility]. You may need a tap to ensure no infection.   Let me know if need anything for me.   Information below:  [facility] [address]  M-F 1-7:30pm  [phone]  ** remember to be very care with Mobic and any over the counter anti inflammatories ( aleve, ibuprofen, advil) , as you have slight damage to your kidneys. **

## 2016-06-07 NOTE — Progress Notes (Signed)
Pre visit review using our clinic review tool, if applicable. No additional management support is needed unless otherwise documented below in the visit note. 

## 2016-06-08 ENCOUNTER — Encounter: Payer: Self-pay | Admitting: Family

## 2016-06-18 NOTE — Progress Notes (Signed)
Unable to leave VM. Closing note due to patient not returning call.

## 2016-06-24 ENCOUNTER — Telehealth: Payer: Self-pay | Admitting: Internal Medicine

## 2016-06-24 DIAGNOSIS — G8929 Other chronic pain: Secondary | ICD-10-CM

## 2016-06-24 DIAGNOSIS — M25561 Pain in right knee: Principal | ICD-10-CM

## 2016-06-24 NOTE — Telephone Encounter (Signed)
Pt called about wanting to inform that she no longer has [place] she now uses [facility].  Thank you!

## 2016-06-24 NOTE — Telephone Encounter (Signed)
Pt would like a referral to see the Rheumatologist. Referral needed please and thank you!  Call pt @ [phone].

## 2016-06-25 ENCOUNTER — Other Ambulatory Visit: Payer: Self-pay | Admitting: *Deleted

## 2016-06-25 DIAGNOSIS — M25561 Pain in right knee: Secondary | ICD-10-CM

## 2016-06-25 MED ORDER — MELOXICAM 15 MG PO TABS: 15.0000 mg | ORAL_TABLET | Freq: Every day | ORAL | 0 refills | Status: DC

## 2016-06-25 MED ORDER — FLUTICASONE PROPIONATE 50 MCG/ACT NA SUSP: 2.0000 | Freq: Every day | NASAL | 3 refills | Status: DC

## 2016-06-25 MED ORDER — ALLOPURINOL 100 MG PO TABS: 100.0000 mg | ORAL_TABLET | Freq: Every day | ORAL | 1 refills | Status: DC

## 2016-06-25 MED ORDER — SIMVASTATIN 40 MG PO TABS: 40.0000 mg | ORAL_TABLET | Freq: Every day | ORAL | 1 refills | Status: DC

## 2016-06-25 MED ORDER — LOSARTAN POTASSIUM-HCTZ 50-12.5 MG PO TABS: 2.0000 | ORAL_TABLET | Freq: Every day | ORAL | 1 refills | Status: DC

## 2016-06-25 NOTE — Telephone Encounter (Signed)
I don't see rationale, left a message for her to call us back to find out why, thanks

## 2016-06-25 NOTE — Telephone Encounter (Signed)
Removed humana

## 2016-06-30 NOTE — Telephone Encounter (Signed)
Call pt-  I think an ortho referral is more appropriate  Place for her knee pain  Referral placed; we will call with an appt

## 2016-06-30 NOTE — Telephone Encounter (Signed)
LMTCB. Need to let pt know that the referral for orthopedics has been ordered and that we will be contacting her with an appt date and time.

## 2016-06-30 NOTE — Telephone Encounter (Signed)
Spoke with pt and she stated that she was seen by you about a week ago and she was sent over to [facility]. They did an xray and found that her knee was bone on bone arthritis. The pt received an injection but thinks that she is going to need continuous care and would like a referral to an orthopedic. The pt thought that she would need a referral to rheumatology but I explained to pt that she does not have rheumatoid arthritis she has osteoarthritis and that is taking care of by an orthopedic. Please advise.

## 2016-06-30 NOTE — Telephone Encounter (Signed)
Spoke with pt and informed her that the referral has been ordered and we would be calling her with an appt date and time.

## 2016-07-14 ENCOUNTER — Other Ambulatory Visit: Payer: Self-pay

## 2016-07-14 ENCOUNTER — Telehealth: Payer: Self-pay | Admitting: Internal Medicine

## 2016-07-14 DIAGNOSIS — M25561 Pain in right knee: Secondary | ICD-10-CM

## 2016-07-14 MED ORDER — SERTRALINE HCL 100 MG PO TABS: ORAL_TABLET | ORAL | 1 refills | Status: DC

## 2016-07-14 NOTE — Telephone Encounter (Signed)
Pt is requesting a refill on her sertraline (ZOLOFT) 100 MG tablet. Her new pharmacy is with [facility], (mail order).

## 2016-07-14 NOTE — Telephone Encounter (Signed)
rx was sent and pt was notified

## 2016-07-26 DIAGNOSIS — M1711 Unilateral primary osteoarthritis, right knee: Secondary | ICD-10-CM | POA: Diagnosis not present

## 2016-08-13 ENCOUNTER — Other Ambulatory Visit: Payer: Self-pay | Admitting: Internal Medicine

## 2016-08-13 DIAGNOSIS — M25561 Pain in right knee: Secondary | ICD-10-CM

## 2016-08-23 DIAGNOSIS — N390 Urinary tract infection, site not specified: Secondary | ICD-10-CM | POA: Diagnosis not present

## 2016-08-24 ENCOUNTER — Ambulatory Visit: Payer: Medicare Other | Admitting: Family

## 2016-09-07 ENCOUNTER — Telehealth: Payer: Self-pay

## 2016-09-07 MED ORDER — PANTOPRAZOLE SODIUM 40 MG PO TBEC: 40.0000 mg | DELAYED_RELEASE_TABLET | Freq: Every day | ORAL | 0 refills | Status: DC

## 2016-09-07 NOTE — Telephone Encounter (Signed)
error 

## 2016-09-07 NOTE — Telephone Encounter (Signed)
refilled 

## 2016-09-14 ENCOUNTER — Other Ambulatory Visit: Payer: Self-pay

## 2016-09-14 DIAGNOSIS — M25561 Pain in right knee: Secondary | ICD-10-CM

## 2016-09-14 MED ORDER — PANTOPRAZOLE SODIUM 40 MG PO TBEC: 40.0000 mg | DELAYED_RELEASE_TABLET | Freq: Every day | ORAL | 1 refills | Status: DC

## 2016-09-14 MED ORDER — MELOXICAM 15 MG PO TABS: 15.0000 mg | ORAL_TABLET | Freq: Every day | ORAL | 0 refills | Status: DC

## 2016-10-05 ENCOUNTER — Other Ambulatory Visit: Payer: Self-pay

## 2016-10-05 MED ORDER — PANTOPRAZOLE SODIUM 40 MG PO TBEC: 40.0000 mg | DELAYED_RELEASE_TABLET | Freq: Every day | ORAL | 1 refills | Status: DC

## 2016-10-13 ENCOUNTER — Ambulatory Visit (INDEPENDENT_AMBULATORY_CARE_PROVIDER_SITE_OTHER): Payer: Medicare Other

## 2016-10-13 ENCOUNTER — Telehealth: Payer: Self-pay | Admitting: Internal Medicine

## 2016-10-13 DIAGNOSIS — R300 Dysuria: Secondary | ICD-10-CM

## 2016-10-13 NOTE — Telephone Encounter (Signed)
Spoke with the patient, symptoms have been progressively getting worse, increased urge, and painful to urinate, no color or odor noticed. No fevers.  Would like to give specimen, Juliann Pulse you okay for nurse visit? No regular appts available today?

## 2016-10-13 NOTE — Telephone Encounter (Signed)
Pt called c/o uti for about 1 weeks. Pt states that she is having pain and frequency. Please advise, thank you!  Call pt @ [phone]

## 2016-10-13 NOTE — Telephone Encounter (Signed)
Scheduled patient nurse schedule for possible uti ?

## 2016-10-14 DIAGNOSIS — R300 Dysuria: Secondary | ICD-10-CM | POA: Diagnosis not present

## 2016-10-14 LAB — POCT URINALYSIS DIPSTICK
Bilirubin, UA: NEGATIVE
Glucose, UA: NEGATIVE
Ketones, UA: NEGATIVE
NITRITE UA: NEGATIVE
PH UA: 5.5 (ref 5.0–8.0)
Spec Grav, UA: 1.01 (ref 1.010–1.025)
UROBILINOGEN UA: 0.2 U/dL

## 2016-10-14 LAB — URINALYSIS, MICROSCOPIC ONLY

## 2016-10-14 NOTE — Progress Notes (Signed)
Patient came in for possible UTI, discussed with PCP, patient left specimen. See telephone note for more details.

## 2016-10-17 LAB — URINE CULTURE

## 2016-10-21 DIAGNOSIS — M1711 Unilateral primary osteoarthritis, right knee: Secondary | ICD-10-CM | POA: Diagnosis not present

## 2016-10-25 ENCOUNTER — Other Ambulatory Visit: Payer: Self-pay | Admitting: Internal Medicine

## 2016-10-25 DIAGNOSIS — M25561 Pain in right knee: Secondary | ICD-10-CM

## 2016-10-27 ENCOUNTER — Other Ambulatory Visit (INDEPENDENT_AMBULATORY_CARE_PROVIDER_SITE_OTHER): Payer: Medicare Other

## 2016-10-27 ENCOUNTER — Telehealth: Payer: Self-pay | Admitting: Internal Medicine

## 2016-10-27 DIAGNOSIS — M62838 Other muscle spasm: Secondary | ICD-10-CM

## 2016-10-27 MED ORDER — TIZANIDINE HCL 4 MG PO CAPS: 4.0000 mg | ORAL_CAPSULE | Freq: Three times a day (TID) | ORAL | 2 refills | Status: DC

## 2016-10-27 MED ORDER — LOSARTAN POTASSIUM 100 MG PO TABS: 100.0000 mg | ORAL_TABLET | Freq: Every day | ORAL | 3 refills | Status: DC

## 2016-10-27 NOTE — Addendum Note (Signed)
Addended by: Crecencio Mc on: 10/27/2016 03:18 PM   Modules accepted: Orders

## 2016-10-27 NOTE — Telephone Encounter (Signed)
Pt called about having pain and cramping in her right leg and pt states her leg is twisting. Pt was transferred to [facility]. Thank you!

## 2016-10-27 NOTE — Telephone Encounter (Signed)
Patient notified and scheduled for lab. You had mentioned calling a muscle relaxer patient is till cramping bad.

## 2016-10-27 NOTE — Telephone Encounter (Signed)
Right leg ,arm and neck cramping since last night happened several times during the night and she thought it was better and started again today she stated the cramping is going also into her back. The bad cramping has just been the last 24 hours but she  has been having some cramps for several months . Patient says the cramp in the leg is so bad she can actually see the leg twist.

## 2016-10-27 NOTE — Telephone Encounter (Signed)
[facility] Patient Name: Jamie Anderson DOB: 06/26/1935 Initial Comment Caller states she is having cramping in bottom of right leg, feels twisted. Nurse Assessment Nurse: Markus Daft, RN, Sherre Poot Date/Time (Eastern Time): 10/27/2016 2:51:23 PM Confirm and document reason for call. If symptomatic, describe symptoms. ---1) Caller states she is having cramping in bottom of right leg in calf/foot/ankle, feels like leg is being twisted, off/on. Started last night all night. Has had other times, but this is most recent episode. Some mild pain now. Leg is the worse of these complaints. 2) c/o neck pain ongoing and spreads to right arm, worse after chiropractor visit. Also, back cramping. Does the patient have any new or worsening symptoms? ---Yes Will a triage be completed? ---Yes Related visit to physician within the last 2 weeks? ---No Does the PT have any chronic conditions? (i.e. diabetes, asthma, etc.) ---Yes List chronic conditions. ---back surgery years ago, Gout, HTN Is this a behavioral health or substance abuse call? ---No Guidelines Guideline Title Affirmed Question Affirmed Notes Leg Pain [1] Thigh or calf pain AND [2] only 1 side AND [3] present > 1 hour Final Disposition User See Physician within 4 Hours (or PCP triage) Markus Daft, RN, Sherre Poot Comments No appts available at office for today. Referrals REFERRED TO PCP OFFICE Disagree/Comply: Comply

## 2016-10-27 NOTE — Telephone Encounter (Addendum)
Stop the colchicine.  She should not be taking it unless she is having a gout flare. Stop the losartan/hct it may be dropping her sodium.  Losartan 100 mg sent as alternative   increase fluid intake using Gatorade or G2 for the electolytes  BMET ordered for today .

## 2016-10-27 NOTE — Telephone Encounter (Signed)
Please call and triage/schedule if needed for tomorrow, thanks

## 2016-10-27 NOTE — Addendum Note (Signed)
Addended by: Leeanne Rio on: 10/27/2016 03:42 PM   Modules accepted: Orders

## 2016-10-27 NOTE — Telephone Encounter (Signed)
See [facility] Note.

## 2016-10-27 NOTE — Telephone Encounter (Signed)
Patient notified and voiced understanding.

## 2016-10-27 NOTE — Addendum Note (Signed)
Addended by: Crecencio Mc on: 10/27/2016 04:52 PM   Modules accepted: Orders

## 2016-10-27 NOTE — Telephone Encounter (Signed)
Tizanidine sent to [facility], [place] [address] [facility]

## 2016-10-28 ENCOUNTER — Ambulatory Visit: Payer: Medicare Other | Admitting: Family Medicine

## 2016-10-28 LAB — BASIC METABOLIC PANEL
BUN: 36 mg/dL — AB (ref 7–25)
CHLORIDE: 104 mmol/L (ref 98–110)
CO2: 21 mmol/L (ref 20–32)
CREATININE: 1.13 mg/dL — AB (ref 0.60–0.93)
Calcium: 9.2 mg/dL (ref 8.6–10.4)
Glucose, Bld: 90 mg/dL (ref 65–99)
Potassium: 3.9 mmol/L (ref 3.5–5.3)
Sodium: 138 mmol/L (ref 135–146)

## 2016-10-29 ENCOUNTER — Telehealth: Payer: Self-pay | Admitting: Internal Medicine

## 2016-10-29 NOTE — Telephone Encounter (Signed)
See result note message 

## 2016-10-29 NOTE — Telephone Encounter (Signed)
Pt called requesting lab results. Please advise, thank you!  Call pt @ [phone]

## 2016-11-03 ENCOUNTER — Other Ambulatory Visit: Payer: Self-pay | Admitting: Internal Medicine

## 2016-11-03 DIAGNOSIS — M25561 Pain in right knee: Secondary | ICD-10-CM

## 2016-11-04 ENCOUNTER — Other Ambulatory Visit: Payer: Self-pay

## 2016-11-04 MED ORDER — LOSARTAN POTASSIUM 100 MG PO TABS: 100.0000 mg | ORAL_TABLET | Freq: Every day | ORAL | 1 refills | Status: DC

## 2016-12-27 ENCOUNTER — Other Ambulatory Visit: Payer: Self-pay | Admitting: Internal Medicine

## 2016-12-28 DIAGNOSIS — I8393 Asymptomatic varicose veins of bilateral lower extremities: Secondary | ICD-10-CM | POA: Diagnosis not present

## 2016-12-28 DIAGNOSIS — L299 Pruritus, unspecified: Secondary | ICD-10-CM | POA: Diagnosis not present

## 2016-12-28 DIAGNOSIS — L300 Nummular dermatitis: Secondary | ICD-10-CM | POA: Diagnosis not present

## 2016-12-28 DIAGNOSIS — L920 Granuloma annulare: Secondary | ICD-10-CM | POA: Diagnosis not present

## 2017-01-05 DIAGNOSIS — H43813 Vitreous degeneration, bilateral: Secondary | ICD-10-CM | POA: Diagnosis not present

## 2017-01-18 ENCOUNTER — Other Ambulatory Visit: Payer: Self-pay

## 2017-01-18 ENCOUNTER — Ambulatory Visit: Payer: Self-pay | Admitting: *Deleted

## 2017-01-18 ENCOUNTER — Emergency Department
Admission: EM | Admit: 2017-01-18 | Discharge: 2017-01-18 | Disposition: A | Discharge status: Home / Self Care | Payer: Medicare Other | Attending: Emergency Medicine | Admitting: Emergency Medicine

## 2017-01-18 ENCOUNTER — Emergency Department: Payer: Medicare Other

## 2017-01-18 ENCOUNTER — Encounter: Payer: Self-pay | Admitting: *Deleted

## 2017-01-18 DIAGNOSIS — Z791 Long term (current) use of non-steroidal anti-inflammatories (NSAID): Secondary | ICD-10-CM | POA: Diagnosis not present

## 2017-01-18 DIAGNOSIS — R079 Chest pain, unspecified: Secondary | ICD-10-CM | POA: Diagnosis not present

## 2017-01-18 DIAGNOSIS — Z79899 Other long term (current) drug therapy: Secondary | ICD-10-CM | POA: Diagnosis not present

## 2017-01-18 DIAGNOSIS — I10 Essential (primary) hypertension: Secondary | ICD-10-CM | POA: Insufficient documentation

## 2017-01-18 DIAGNOSIS — R0789 Other chest pain: Secondary | ICD-10-CM | POA: Insufficient documentation

## 2017-01-18 LAB — BASIC METABOLIC PANEL
ANION GAP: 9 (ref 5–15)
BUN: 21 mg/dL — AB (ref 6–20)
CO2: 26 mmol/L (ref 22–32)
Calcium: 10 mg/dL (ref 8.9–10.3)
Chloride: 104 mmol/L (ref 101–111)
Creatinine, Ser: 0.85 mg/dL (ref 0.44–1.00)
GFR calc non Af Amer: >60 mL/min (ref >60)
Glucose, Bld: 96 mg/dL (ref 65–99)
POTASSIUM: 3.7 mmol/L (ref 3.5–5.1)
SODIUM: 139 mmol/L (ref 135–145)

## 2017-01-18 LAB — CBC
HEMATOCRIT: 37.5 % (ref 35.0–47.0)
HEMOGLOBIN: 12.6 g/dL (ref 12.0–16.0)
MCH: 29 pg (ref 26.0–34.0)
MCHC: 33.5 g/dL (ref 32.0–36.0)
MCV: 86.4 fL (ref 80.0–100.0)
Platelets: 188 10*3/uL (ref 150–440)
RBC: 4.35 MIL/uL (ref 3.80–5.20)
RDW: 15.1 % — AB (ref 11.5–14.5)
WBC: 6 10*3/uL (ref 3.6–11.0)

## 2017-01-18 LAB — TROPONIN I: Troponin I: <0.03 ng/mL (ref <0.03)

## 2017-01-18 MED ORDER — GI COCKTAIL ~~LOC~~
30.0000 mL | Freq: Once | ORAL | Status: AC
  Administered 2017-01-18: 30 mL via ORAL
  Filled 2017-01-18: qty 30

## 2017-01-18 MED ORDER — IOPAMIDOL (ISOVUE-370) INJECTION 76%
75.0000 mL | Freq: Once | INTRAVENOUS | Status: AC | PRN
  Administered 2017-01-18: 75 mL via INTRAVENOUS

## 2017-01-18 MED ORDER — HYDROCODONE-ACETAMINOPHEN 5-325 MG PO TABS: 1.0000 | ORAL_TABLET | Freq: Four times a day (QID) | ORAL | 0 refills | Status: DC | PRN

## 2017-01-18 NOTE — Telephone Encounter (Signed)
Called in c/o chest pain under left breast bone area.  She is talking in full sentences however she sounds tight and is not breathing at ease as though in pain.   Denies any other symptoms or pain radiating to other areas.  The pain is constant now.   "I think it's just bad indigestion"   I strongly urged her to call 911 and go to the ED for evaluation.   "I really don't think I'm having a heart issue just bad indigestion"   I emphasized the importance of being evaluated because indigestion and heart issues can mimic each other.   She informed me that her father had died of a heart attack and that several other members of her family had cardiac histories.   She denied having a cardiac history herself.  She did not want to go by ambulance.  She is going to call her neighbor to take her.   I again emphasized the importance of going by ambulance however she did not want to do that.  I just tried to call her back and check on her status.   I called twice and got her answering machine both times.   Reason for Disposition . [1] Chest pain lasts > 5 minutes AND [2] described as crushing, pressure-like, or heavy  Answer Assessment - Initial Assessment Questions 1. LOCATION: "Where does it hurt?"       Bottom of my left breast pain 2. RADIATION: "Does the pain go anywhere else?" (e.g., into neck, jaw, arms, back)     No pain any where else.   Right arm tingling but I've had that for a while.  Saw a chiropractor for it.   3. ONSET: "When did the chest pain begin?" (Minutes, hours or days)      Did not have pain this morning.  Only had coffee this morning. It started then. 4. PATTERN "Does the pain come and go, or has it been constant since it started?"  "Does it get worse with exertion?"      Constant.   5. DURATION: "How long does it last" (e.g., seconds, minutes, hours)     Constant now 6. SEVERITY: "How bad is the pain?"  (e.g., Scale 1-10; mild, moderate, or severe)    - MILD (1-3): doesn't interfere  with normal activities     - MODERATE (4-7): interferes with normal activities or awakens from sleep    - SEVERE (8-10): excruciating pain, unable to do any normal activities       Very uncomforable 7. CARDIAC RISK FACTORS: "Do you have any history of heart problems or risk factors for heart disease?" (e.g., prior heart attack, angina; high blood pressure, diabetes, being overweight, high cholesterol, smoking, or strong family history of heart disease)     High BP with meds, yes, to cholestera, family hx 8. PULMONARY RISK FACTORS: "Do you have any history of lung disease?"  (e.g., blood clots in lung, asthma, emphysema, birth control pills)     No  9. CAUSE: "What do you think is causing the chest pain?"     Heartburn maybe 10. OTHER SYMPTOMS: "Do you have any other symptoms?" (e.g., dizziness, nausea, vomiting, sweating, fever, difficulty breathing, cough)       Short of breath 11. PREGNANCY: "Is there any chance you are pregnant?" "When was your last menstrual period?"       Not asked  Protocols used: CHEST PAIN-A-AH

## 2017-01-18 NOTE — ED Triage Notes (Signed)
Pt to ED reporting left sided rib and chest pain radiating to her back. PT reports a hx of gastric reflux that has felt similar. No NVD reported. Pt reports her family has told her she seems more SOB than normal but pt denies feeling SOB.

## 2017-01-18 NOTE — ED Provider Notes (Signed)
[facility] Emergency Department Provider Note  Time seen: 7:03 PM  I have reviewed the triage vital signs and the nursing notes.   HISTORY  Chief Complaint Chest Pain    HPI Jamie Anderson is a 81 y.o. female with a past medical history of hypertension, hyperlipidemia, presents to the emergency department for left-sided chest pain.  According to the patient over the past 2-3 weeks she has been experiencing intermittent sharp left-sided chest pain worse with deep inspiration.  She states over the past 3 days it has been much more frequent.  Denies any shortness of breath although the daughter states she appears more short of breath to her.  Denies any nausea or diaphoresis.  Denies any leg pain or swelling.  Denies any history of DVT or blood clot.  Denies any recent cough congestion or fever.  Patient states the pain is gone and then it will occur very sharply.  During my evaluation the pain appears to be occurring every 1-2 minutes lasting seconds and resolving.  Past Medical History:  Diagnosis Date  . Granuloma annulare   . Hyperlipidemia   . Hypertension   . Menopause     Patient Active Problem List   Diagnosis Date Noted  . Post-menopausal atrophic vaginitis 01/18/2016  . Encounter for preventive health examination 01/18/2016  . Dysuria 04/10/2015  . Colon cancer screening 01/16/2015  . Numbness and tingling of right arm 01/16/2015  . Plantar fasciitis of right foot 12/24/2014  . Obesity 12/31/2012  . Gluten-sensitive enteropathy 12/29/2012  . Obsessive compulsive disorder 12/29/2012  . Cystic teratoma of right ovary 11/23/2012  . Postmenopausal vaginal bleeding 04/29/2012  . Gout 04/13/2012  . Knee pain 04/03/2012  . Medicare annual wellness visit, subsequent 11/26/2011  . Hyperlipidemia LDL goal <130 05/25/2011  . Hypertension 05/25/2011    Past Surgical History:  Procedure Laterality Date  . APPENDECTOMY    . DILATION AND CURETTAGE OF  UTERUS    . RIGHT OOPHORECTOMY Right feb 2014   [name]  . [place]  2010  . VENOUS ABLATION     bilateral    Prior to Admission medications   Medication Sig Start Date End Date Taking? Authorizing Provider  allopurinol (ZYLOPRIM) 100 MG tablet TAKE 1 TABLET BY MOUTH  DAILY 12/27/16   Crecencio Mc, MD  conjugated estrogens (PREMARIN) vaginal cream Place 1 Applicatorful vaginally 2 (two) times a week. 01/19/16   Crecencio Mc, MD  fluticasone (FLONASE) 50 MCG/ACT nasal spray Place 2 sprays into both nostrils daily. 06/25/16   Crecencio Mc, MD  losartan (COZAAR) 100 MG tablet Take 1 tablet (100 mg total) by mouth daily. 11/04/16   Crecencio Mc, MD  meloxicam (MOBIC) 15 MG tablet TAKE 1 TABLET BY MOUTH  DAILY 10/25/16   Crecencio Mc, MD  pantoprazole (PROTONIX) 40 MG tablet Take 1 tablet (40 mg total) by mouth daily. 10/05/16   Crecencio Mc, MD  Probiotic Product (PROBIOTIC DAILY PO) Take by mouth.    [provider]  sertraline (ZOLOFT) 100 MG tablet TAKE 1 TABLET BY MOUTH  EVERY DAY (REPLACES   CITALOPRAM) 11/03/16   Crecencio Mc, MD  simvastatin (ZOCOR) 40 MG tablet TAKE 1 TABLET BY MOUTH AT  BEDTIME 12/27/16   Crecencio Mc, MD  tiZANidine (ZANAFLEX) 4 MG capsule Take 1 capsule (4 mg total) by mouth 3 (three) times daily. 10/27/16   Crecencio Mc, MD    Allergies  Allergen Reactions  .  Biaxin [Clarithromycin]   . Corn-Containing Products   . Dust Mite Extract     Almonds,Mold Spores  . Gluten Meal   . Oat   . Shrimp [Shellfish Allergy]     Family History  Problem Relation Age of Onset  . Early death Mother        durig childbirth  . Heart disease Father   . Heart disease Sister   . Heart disease Brother   . Cancer Brother     Social History Social History   Tobacco Use  . Smoking status: Never Smoker  . Smokeless tobacco: Never Used  Substance Use Topics  . Alcohol use: Yes    Alcohol/week: 2.4 oz    Types: 4 Glasses of wine per week   . Drug use: Not on file    Review of Systems Constitutional: Negative for fever. Cardiovascular: Positive for intermittent left-sided chest pain worse with deep inspiration. Respiratory: Negative for shortness of breath. Gastrointestinal: Negative for abdominal pain, vomiting Musculoskeletal: Negative for leg pain or swelling Neurological: Negative for headache All other ROS negative  ____________________________________________   PHYSICAL EXAM:  VITAL SIGNS: ED Triage Vitals  Enc Vitals Group     BP 01/18/17 1649 (!) 205/84     Pulse Rate 01/18/17 1649 78     Resp 01/18/17 1649 16     Temp 01/18/17 1649 98.1 F (36.7 C)     Temp Source 01/18/17 1649 Oral     SpO2 01/18/17 1649 99 %     Weight 01/18/17 1626 180 lb (81.6 kg)     Height 01/18/17 1626 5\' 4"  (1.626 m)     Head Circumference --      Peak Flow --      Pain Score 01/18/17 1626 8     Pain Loc --      Pain Edu? --      Excl. in [place]? --    Constitutional: Alert and oriented. Well appearing and in no distress. Eyes: Normal exam ENT   Head: Normocephalic and atraumatic.   Mouth/Throat: Mucous membranes are moist. Cardiovascular: Normal rate, regular rhythm. No murmur Respiratory: Normal respiratory effort without tachypnea nor retractions. Breath sounds are clear Gastrointestinal: Soft and nontender. No distention. Musculoskeletal: Nontender with normal range of motion in all extremities. No lower extremity tenderness or edema. Neurologic:  Normal speech and language. No gross focal neurologic deficits  Skin:  Skin is warm, dry and intact.  Psychiatric: Mood and affect are normal.   ____________________________________________    EKG  EKG reviewed and interpreted by myself shows normal sinus rhythm at 72 bpm, narrow QRS, normal axis, normal intervals, no concerning ST changes.  ____________________________________________    RADIOLOGY  Chest x-ray shows no acute abnormality.  Possible  bronchitic changes.  ____________________________________________   INITIAL IMPRESSION / ASSESSMENT AND PLAN / ED COURSE  Pertinent labs & imaging results that were available during my care of the patient were reviewed by me and considered in my medical decision making (see chart for details).  Patient presents to the emergency department with chest pain intermittent over the past 2 weeks worse over the past 3 days.  Differential could include ACS, chest wall pain, PE, pneumothorax, pneumonia.  Overall the patient appears very well, unable to reproduce pain on my examination however the patient has several episodes during my evaluation occurring every 1-2 minutes lasting several seconds and then resolving.  The patient's labs are largely within normal limits including negative troponin.  Chest x-ray is  normal.  EKG is reassuring.  Given the patient sharp pain worse with deep inspiration we will obtain a CT angiography of the chest.  As a symptom have been ongoing for approximately 2 weeks although intermittent I believe one set of cardiac enzymes is sufficient.  Patient agreeable to this plan of care.  I reviewed the patient's records available, largely noncontributory to today's visit.  CT scan is negative.  Given the patient's intermittent spasm-like pain I suspect likely chest wall pain.  Patient has a follow-up appointment with her doctor on Friday.  We will prescribe a short course of pain medication to be taken at night if needed for discomfort.  Patient agreeable to plan of care.  ____________________________________________   FINAL CLINICAL IMPRESSION(S) / ED DIAGNOSES  Chest pain    Harvest Dark, MD 01/18/17 2035

## 2017-01-21 ENCOUNTER — Ambulatory Visit (INDEPENDENT_AMBULATORY_CARE_PROVIDER_SITE_OTHER): Payer: Medicare Other | Admitting: Internal Medicine

## 2017-01-21 ENCOUNTER — Encounter: Payer: Self-pay | Admitting: Internal Medicine

## 2017-01-21 VITALS — BP 144/96 | HR 76 | Temp 97.7°F | Resp 15 | Ht 64.0 in | Wt 189.6 lb

## 2017-01-21 DIAGNOSIS — M1000 Idiopathic gout, unspecified site: Secondary | ICD-10-CM | POA: Diagnosis not present

## 2017-01-21 DIAGNOSIS — R200 Anesthesia of skin: Secondary | ICD-10-CM | POA: Diagnosis not present

## 2017-01-21 DIAGNOSIS — E785 Hyperlipidemia, unspecified: Secondary | ICD-10-CM

## 2017-01-21 DIAGNOSIS — Z0000 Encounter for general adult medical examination without abnormal findings: Secondary | ICD-10-CM | POA: Diagnosis not present

## 2017-01-21 DIAGNOSIS — R0789 Other chest pain: Secondary | ICD-10-CM | POA: Diagnosis not present

## 2017-01-21 DIAGNOSIS — M1711 Unilateral primary osteoarthritis, right knee: Secondary | ICD-10-CM | POA: Diagnosis not present

## 2017-01-21 DIAGNOSIS — I700 Atherosclerosis of aorta: Secondary | ICD-10-CM

## 2017-01-21 DIAGNOSIS — R079 Chest pain, unspecified: Secondary | ICD-10-CM

## 2017-01-21 DIAGNOSIS — I10 Essential (primary) hypertension: Secondary | ICD-10-CM | POA: Diagnosis not present

## 2017-01-21 DIAGNOSIS — R0602 Shortness of breath: Secondary | ICD-10-CM | POA: Diagnosis not present

## 2017-01-21 DIAGNOSIS — R590 Localized enlarged lymph nodes: Secondary | ICD-10-CM | POA: Diagnosis not present

## 2017-01-21 DIAGNOSIS — Z1231 Encounter for screening mammogram for malignant neoplasm of breast: Secondary | ICD-10-CM

## 2017-01-21 DIAGNOSIS — R202 Paresthesia of skin: Secondary | ICD-10-CM

## 2017-01-21 DIAGNOSIS — Z1239 Encounter for other screening for malignant neoplasm of breast: Secondary | ICD-10-CM

## 2017-01-21 LAB — COMPREHENSIVE METABOLIC PANEL
ALT: 17 U/L (ref 0–35)
AST: 21 U/L (ref 0–37)
Albumin: 4.2 g/dL (ref 3.5–5.2)
Alkaline Phosphatase: 110 U/L (ref 39–117)
BUN: 23 mg/dL (ref 6–23)
CHLORIDE: 106 meq/L (ref 96–112)
CO2: 29 meq/L (ref 19–32)
CREATININE: 0.97 mg/dL (ref 0.40–1.20)
Calcium: 9.6 mg/dL (ref 8.4–10.5)
GFR: 58.84 mL/min — ABNORMAL LOW (ref >60.00)
GLUCOSE: 99 mg/dL (ref 70–99)
Potassium: 4.4 mEq/L (ref 3.5–5.1)
SODIUM: 141 meq/L (ref 135–145)
Total Bilirubin: 0.5 mg/dL (ref 0.2–1.2)
Total Protein: 6.6 g/dL (ref 6.0–8.3)

## 2017-01-21 LAB — LIPID PANEL
CHOL/HDL RATIO: 2
CHOLESTEROL: 178 mg/dL (ref 0–200)
HDL: 77.7 mg/dL (ref >39.00)
LDL CALC: 86 mg/dL (ref 0–99)
NonHDL: 100.04
Triglycerides: 70 mg/dL (ref 0.0–149.0)
VLDL: 14 mg/dL (ref 0.0–40.0)

## 2017-01-21 LAB — URIC ACID: Uric Acid, Serum: 5.4 mg/dL (ref 2.4–7.0)

## 2017-01-21 MED ORDER — METOPROLOL SUCCINATE ER 25 MG PO TB24: 25.0000 mg | ORAL_TABLET | Freq: Every day | ORAL | 0 refills | Status: DC

## 2017-01-21 NOTE — Patient Instructions (Addendum)
Dr Marry Guan and Dr Adline Mango are the two best knee surgeons in the area. Let me know if you would like a second opinion    ADDING metoprolol succinate one talblet   daily at bedtime  For blood pressure'  Goal in one week is 130/80 or less  And pulse > 60   Referral to cardiology in process.  If normal workup,  Pulmonology referral for shortness of breath would be advised.   Mammogram ordered  cologuard ordered  If you would like an MRI of your cervical spine to rule out a pinched nerve,  Let me know   Please return in 3 months   Health Maintenance for Postmenopausal Women Menopause is a normal process in which your reproductive ability comes to an end. This process happens gradually over a span of months to years, usually between the ages of 85 and 55. Menopause is complete when you have missed 12 consecutive menstrual periods. It is important to talk with your health care provider about some of the most common conditions that affect postmenopausal women, such as heart disease, cancer, and bone loss (osteoporosis). Adopting a healthy lifestyle and getting preventive care can help to promote your health and wellness. Those actions can also lower your chances of developing some of these common conditions. What should I know about menopause? During menopause, you may experience a number of symptoms, such as:  Moderate-to-severe hot flashes.  Night sweats.  Decrease in sex drive.  Mood swings.  Headaches.  Tiredness.  Irritability.  Memory problems.  Insomnia.  Choosing to treat or not to treat menopausal changes is an individual decision that you make with your health care provider. What should I know about hormone replacement therapy and supplements? Hormone therapy products are effective for treating symptoms that are associated with menopause, such as hot flashes and night sweats. Hormone replacement carries certain risks, especially as you become older. If you are  thinking about using estrogen or estrogen with progestin treatments, discuss the benefits and risks with your health care provider. What should I know about heart disease and stroke? Heart disease, heart attack, and stroke become more likely as you age. This may be due, in part, to the hormonal changes that your body experiences during menopause. These can affect how your body processes dietary fats, triglycerides, and cholesterol. Heart attack and stroke are both medical emergencies. There are many things that you can do to help prevent heart disease and stroke:  Have your blood pressure checked at least every 1-2 years. High blood pressure causes heart disease and increases the risk of stroke.  If you are 85-52 years old, ask your health care provider if you should take aspirin to prevent a heart attack or a stroke.  Do not use any tobacco products, including cigarettes, chewing tobacco, or electronic cigarettes. If you need help quitting, ask your health care provider.  It is important to eat a healthy diet and maintain a healthy weight. ? Be sure to include plenty of vegetables, fruits, low-fat dairy products, and lean protein. ? Avoid eating foods that are high in solid fats, added sugars, or salt (sodium).  Get regular exercise. This is one of the most important things that you can do for your health. ? Try to exercise for at least 150 minutes each week. The type of exercise that you do should increase your heart rate and make you sweat. This is known as moderate-intensity exercise. ? Try to do strengthening exercises at least twice  each week. Do these in addition to the moderate-intensity exercise.  Know your numbers.Ask your health care provider to check your cholesterol and your blood glucose. Continue to have your blood tested as directed by your health care provider.  What should I know about cancer screening? There are several types of cancer. Take the following steps to reduce  your risk and to catch any cancer development as early as possible. Breast Cancer  Practice breast self-awareness. ? This means understanding how your breasts normally appear and feel. ? It also means doing regular breast self-exams. Let your health care provider know about any changes, no matter how small.  If you are 40 or older, have a clinician do a breast exam (clinical breast exam or CBE) every year. Depending on your age, family history, and medical history, it may be recommended that you also have a yearly breast X-ray (mammogram).  If you have a family history of breast cancer, talk with your health care provider about genetic screening.  If you are at high risk for breast cancer, talk with your health care provider about having an MRI and a mammogram every year.  Breast cancer (BRCA) gene test is recommended for women who have family members with BRCA-related cancers. Results of the assessment will determine the need for genetic counseling and BRCA1 and for BRCA2 testing. BRCA-related cancers include these types: ? Breast. This occurs in males or females. ? Ovarian. ? Tubal. This may also be called fallopian tube cancer. ? Cancer of the abdominal or pelvic lining (peritoneal cancer). ? Prostate. ? Pancreatic.  Cervical, Uterine, and Ovarian Cancer Your health care provider may recommend that you be screened regularly for cancer of the pelvic organs. These include your ovaries, uterus, and vagina. This screening involves a pelvic exam, which includes checking for microscopic changes to the surface of your cervix (Pap test).  For women ages 21-65, health care providers may recommend a pelvic exam and a Pap test every three years. For women ages 32-65, they may recommend the Pap test and pelvic exam, combined with testing for human papilloma virus (HPV), every five years. Some types of HPV increase your risk of cervical cancer. Testing for HPV may also be done on women of any age who  have unclear Pap test results.  Other health care providers may not recommend any screening for nonpregnant women who are considered low risk for pelvic cancer and have no symptoms. Ask your health care provider if a screening pelvic exam is right for you.  If you have had past treatment for cervical cancer or a condition that could lead to cancer, you need Pap tests and screening for cancer for at least 20 years after your treatment. If Pap tests have been discontinued for you, your risk factors (such as having a new sexual partner) need to be reassessed to determine if you should start having screenings again. Some women have medical problems that increase the chance of getting cervical cancer. In these cases, your health care provider may recommend that you have screening and Pap tests more often.  If you have a family history of uterine cancer or ovarian cancer, talk with your health care provider about genetic screening.  If you have vaginal bleeding after reaching menopause, tell your health care provider.  There are currently no reliable tests available to screen for ovarian cancer.  Lung Cancer Lung cancer screening is recommended for adults 64-47 years old who are at high risk for lung cancer because of  a history of smoking. A yearly low-dose CT scan of the lungs is recommended if you:  Currently smoke.  Have a history of at least 30 pack-years of smoking and you currently smoke or have quit within the past 15 years. A pack-year is smoking an average of one pack of cigarettes per day for one year.  Yearly screening should:  Continue until it has been 15 years since you quit.  Stop if you develop a health problem that would prevent you from having lung cancer treatment.  Colorectal Cancer  This type of cancer can be detected and can often be prevented.  Routine colorectal cancer screening usually begins at age 71 and continues through age 16.  If you have risk factors for colon  cancer, your health care provider may recommend that you be screened at an earlier age.  If you have a family history of colorectal cancer, talk with your health care provider about genetic screening.  Your health care provider may also recommend using home test kits to check for hidden blood in your stool.  A small camera at the end of a tube can be used to examine your colon directly (sigmoidoscopy or colonoscopy). This is done to check for the earliest forms of colorectal cancer.  Direct examination of the colon should be repeated every 5-10 years until age 70. However, if early forms of precancerous polyps or small growths are found or if you have a family history or genetic risk for colorectal cancer, you may need to be screened more often.  Skin Cancer  Check your skin from head to toe regularly.  Monitor any moles. Be sure to tell your health care provider: ? About any new moles or changes in moles, especially if there is a change in a mole's shape or color. ? If you have a mole that is larger than the size of a pencil eraser.  If any of your family members has a history of skin cancer, especially at a young age, talk with your health care provider about genetic screening.  Always use sunscreen. Apply sunscreen liberally and repeatedly throughout the day.  Whenever you are outside, protect yourself by wearing long sleeves, pants, a wide-brimmed hat, and sunglasses.  What should I know about osteoporosis? Osteoporosis is a condition in which bone destruction happens more quickly than new bone creation. After menopause, you may be at an increased risk for osteoporosis. To help prevent osteoporosis or the bone fractures that can happen because of osteoporosis, the following is recommended:  If you are 27-38 years old, get at least 1,000 mg of calcium and at least 600 mg of vitamin D per day.  If you are older than age 81 but younger than age 15, get at least 1,200 mg of calcium and  at least 600 mg of vitamin D per day.  If you are older than age 56, get at least 1,200 mg of calcium and at least 800 mg of vitamin D per day.  Smoking and excessive alcohol intake increase the risk of osteoporosis. Eat foods that are rich in calcium and vitamin D, and do weight-bearing exercises several times each week as directed by your health care provider. What should I know about how menopause affects my mental health? Depression may occur at any age, but it is more common as you become older. Common symptoms of depression include:  Low or sad mood.  Changes in sleep patterns.  Changes in appetite or eating patterns.  Feeling an  overall lack of motivation or enjoyment of activities that you previously enjoyed.  Frequent crying spells.  Talk with your health care provider if you think that you are experiencing depression. What should I know about immunizations? It is important that you get and maintain your immunizations. These include:  Tetanus, diphtheria, and pertussis (Tdap) booster vaccine.  Influenza every year before the flu season begins.  Pneumonia vaccine.  Shingles vaccine.  Your health care provider may also recommend other immunizations. This information is not intended to replace advice given to you by your health care provider. Make sure you discuss any questions you have with your health care provider. Document Released: 04/16/2005 Document Revised: 09/12/2015 Document Reviewed: 11/26/2014 Elsevier Interactive Patient Education  2018 [employer].

## 2017-01-21 NOTE — Progress Notes (Signed)
Patient ID: Jamie Anderson, female    DOB: 13-Sep-1935  Age: 81 y.o. MRN: [id number]  The patient is here for annual preventive examination and management of other chronic and acute problems. Last seen one year ago.   Due for colon ca screening, average risk rec cologuard  Has not had mammogram since 2016 DEXA 2016: normal T scores.    The risk factors are reflected in the social history.  The roster of all physicians providing medical care to patient - is listed in the Snapshot section of the chart.  Activities of daily living:  The patient is 100% independent in all ADLs: dressing, toileting, feeding as well as independent mobility  Home safety : The patient has smoke detectors in the home. They wear seatbelts.  There are no firearms at home. There is no violence in the home.   There is no risks for hepatitis, STDs or HIV. There is no   history of blood transfusion. They have no travel history to infectious disease endemic areas of the world.  The patient has seen their dentist in the last six month. They have seen their eye doctor in the last year. They admit to slight hearing difficulty with regard to whispered voices and some television programs.  They have deferred audiologic testing in the last year.  They do not  have excessive sun exposure. Discussed the need for sun protection: hats, long sleeves and use of sunscreen if there is significant sun exposure.   Diet: the importance of a healthy diet is discussed. They do have a healthy diet.  The benefits of regular aerobic exercise were discussed. She walks 4 times per week ,  20 minutes.   Depression screen: there are no signs or vegative symptoms of depression- irritability, change in appetite, anhedonia, sadness/tearfullness.  Cognitive assessment: the patient manages all their financial and personal affairs and is actively engaged. They could relate day,date,year and events; recalled 2/3 objects at 3 minutes; performed clock-face  test normally.  The following portions of the patient's history were reviewed and updated as appropriate: allergies, current medications, past family history, past medical history,  past surgical history, past social history  and problem list.  Visual acuity was not assessed per patient preference since she has regular follow up with her ophthalmologist. Hearing and body mass index were assessed and reviewed.   During the course of the visit the patient was educated and counseled about appropriate screening and preventive services including : fall prevention , diabetes screening, nutrition counseling, colorectal cancer screening, and recommended immunizations.    CC: The primary encounter diagnosis was Hilar adenopathy. Diagnoses of Shortness of breath, Hyperlipidemia LDL goal <100, Essential hypertension, Idiopathic gout, unspecified chronicity, unspecified site, Chest pain, unspecified type, Breast cancer screening, Primary osteoarthritis of right knee, Atypical chest pain, Numbness and tingling of right arm, Encounter for preventive health examination, Hyperlipidemia LDL goal <130, and Aortic atherosclerosis ([place]) were also pertinent to this visit.   ER evaluation Nov 13 for several week history of atypical left sided chest pain that became more severe and constant  the night before presentation.  The pain may have been aggravated by yard work she had done earlier in the day. . Today she also reports that she had also been experiencing  shortness of breath  With minimal activity that had been progressively worsening as well.  Her chest pain was treated as costochondritis, and she was given a "GI cocktail" which resolved her  pain for several days, but  she has had recurrences to a lesser degree since then  Had CT angiogram in ER.  Findings reviewed in detail with patient today as they were not mentioned during ER visit: 1) Bilateral hilar LNs measuring 9 mm, and 2) unchanged right adrenal nodule seen  initially in 2015., and 3)  Aortic atheroclerosis was also noted.      Hypertension.  BP was 200/85 in ER,  Better at home since then but readings still elevated. Reports compliance with losartan.    Persistent Right arm pain radiates down to wrist.  Seeing a  Chiropractor who suggested her pain was coming from a  pinched nerve.   Seeing DR Rudene Christians for  persistent right knee pain due to severe patellofemoral DJD and chondrocalcinosis  Of the lateral compartment which has been treated thus far with c/s injections,  althought Visco has been offered byut too cost prohibitive.  No significant improvement in pain . Discussed getting a second opinion .   History Brandace has a past medical history of Granuloma annulare, Hyperlipidemia, Hypertension, and Menopause.   She has a past surgical history that includes Spine surgery (2010); Appendectomy; Venous ablation; Dilation and curettage of uterus; and Right oophorectomy (Right, feb 2014).   Her family history includes Cancer in her brother; Early death in her mother; Heart disease in her brother, father, and sister.She reports that  has never smoked. she has never used smokeless tobacco. She reports that she drinks about 2.4 oz of alcohol per week. Her drug history is not on file.  Outpatient Medications Prior to Visit  Medication Sig Dispense Refill  . allopurinol (ZYLOPRIM) 100 MG tablet TAKE 1 TABLET BY MOUTH  DAILY 90 tablet 1  . conjugated estrogens (PREMARIN) vaginal cream Place 1 Applicatorful vaginally 2 (two) times a week. 42.5 g 12  . fluticasone (FLONASE) 50 MCG/ACT nasal spray Place 2 sprays into both nostrils daily. 48 g 3  . losartan (COZAAR) 100 MG tablet Take 1 tablet (100 mg total) by mouth daily. 90 tablet 1  . meloxicam (MOBIC) 15 MG tablet TAKE 1 TABLET BY MOUTH  DAILY 90 tablet 1  . pantoprazole (PROTONIX) 40 MG tablet Take 1 tablet (40 mg total) by mouth daily. 90 tablet 1  . sertraline (ZOLOFT) 100 MG tablet TAKE 1 TABLET BY MOUTH   EVERY DAY (REPLACES   CITALOPRAM) 90 tablet 1  . simvastatin (ZOCOR) 40 MG tablet TAKE 1 TABLET BY MOUTH AT  BEDTIME 90 tablet 1  . HYDROcodone-acetaminophen (NORCO/VICODIN) 5-325 MG tablet Take 1 tablet every 6 (six) hours as needed by mouth. (Patient not taking: Reported on 01/21/2017) 10 tablet 0  . Probiotic Product (PROBIOTIC DAILY PO) Take by mouth.    Marland Kitchen tiZANidine (ZANAFLEX) 4 MG capsule Take 1 capsule (4 mg total) by mouth 3 (three) times daily. (Patient not taking: Reported on 01/21/2017) 60 capsule 2   No facility-administered medications prior to visit.     Review of Systems   Patient denies headache, fevers, malaise, unintentional weight loss, skin rash, eye pain, sinus congestion and sinus pain, sore throat, dysphagia,  hemoptysis , cough, dyspnea, wheezing, chest pain, palpitations, orthopnea, edema, abdominal pain, nausea, melena, diarrhea, constipation, flank pain, dysuria, hematuria, urinary  Frequency, nocturia, numbness, tingling, seizures,  Focal weakness, Loss of consciousness,  Tremor, insomnia, depression, anxiety, and suicidal ideation.     Objective:  BP (!) 144/96 (BP Location: Left Arm, Patient Position: Sitting, Cuff Size: Large)   Pulse 76   Temp 97.7 F (36.5 C) (  Oral)   Resp 15   Ht 5\' 4"  (1.626 m)   Wt 189 lb 9.6 oz (86 kg)   SpO2 98%   BMI 32.54 kg/m   Physical Exam   General appearance: alert, cooperative and appears stated age Head: Normocephalic, without obvious abnormality, atraumatic Eyes: conjunctivae/corneas clear. PERRL, EOM's intact. Fundi benign. Ears: normal TM's and external ear canals both ears Nose: Nares normal. Septum midline. Mucosa normal. No drainage or sinus tenderness. Throat: lips, mucosa, and tongue normal; teeth and gums normal Neck: no adenopathy, no carotid bruit, no JVD, supple, symmetrical, trachea midline and thyroid not enlarged, symmetric, no tenderness/mass/nodules Lungs: clear to auscultation bilaterally Breasts:  normal appearance, no masses or tenderness Heart: regular rate and rhythm, S1, S2 normal, no murmur, click, rub or gallop Abdomen: soft, non-tender; bowel sounds normal; no masses,  no organomegaly Extremities: extremities normal, atraumatic, no cyanosis or edema Pulses: 2+ and symmetric Skin: Skin color, texture, turgor normal. No rashes or lesions Neurologic: Alert and oriented X 3, normal strength and tone. Normal symmetric reflexes. Normal coordination and gait.      Assessment & Plan:   Problem List Items Addressed This Visit    Aortic atherosclerosis ([place])    Noted on CT angiogram done Nov 13 by ED for evaluation of atypical chest pain . Continue statin       Relevant Medications   metoprolol succinate (TOPROL-XL) 25 MG 24 hr tablet   Atypical chest pain    acconpanied by shortness of breath.  She ruled out for AMI,  But has aortic atherosclerosis.  referal to Cardiology  Advised       Encounter for preventive health examination    Annual comprehensive preventive exam was done as well as an evaluation and management of chronic conditions .  During the course of the visit the patient was educated and counseled about appropriate screening and preventive services including :  diabetes screening, lipid analysis with projected  10 year  risk for CAD , nutrition counseling, breast, cervical and colorectal cancer screening, and recommended immunizations.  Printed recommendations for health maintenance screenings was given      Gout   Relevant Orders   Uric acid (Completed)   Hilar adenopathy - Primary    Bilateral, with chest pain and dyspnea,  Consider sarcoid,  Checking ACE  Which is normal.  Plan to refer to pulmonology for PFTA and eval if cardiology evaluation is negative for cardiomyopathy and significant CA      Relevant Orders   Angiotensin converting enzyme (Completed)   Hyperlipidemia LDL goal <130    LDL and triglycerides  Are at goal on simvastatin   Lab Results   Component Value Date   CHOL 178 01/21/2017   HDL 77.70 01/21/2017   LDLCALC 86 01/21/2017   LDLDIRECT 171.0 04/20/2016   TRIG 70.0 01/21/2017   CHOLHDL 2 01/21/2017   Lab Results  Component Value Date   ALT 17 01/21/2017   AST 21 01/21/2017   ALKPHOS 110 01/21/2017   BILITOT 0.5 01/21/2017         Relevant Medications   metoprolol succinate (TOPROL-XL) 25 MG 24 hr tablet   Hypertension    not at goal on current regimen.  Adding metoprolol XR 25 mg daily       Relevant Medications   metoprolol succinate (TOPROL-XL) 25 MG 24 hr tablet   Other Relevant Orders   Comprehensive metabolic panel (Completed)   Numbness and tingling of right arm  Chronic , now with right arm pain  Attributed to radiculopathy .  MRI cervical spine needed       Right knee DJD     due to severe patellofemoral DJD and chondrocalcinosis of the lateral compartment. Receiving steroid injections from Dr Rudene Christians and Rachelle Hora with no improvement.        Other Visit Diagnoses    Shortness of breath       Relevant Orders   Angiotensin converting enzyme (Completed)   Hyperlipidemia LDL goal <100       Relevant Medications   metoprolol succinate (TOPROL-XL) 25 MG 24 hr tablet   Other Relevant Orders   Lipid panel (Completed)   Chest pain, unspecified type       Relevant Orders   Ambulatory referral to Cardiology   Breast cancer screening       Relevant Orders   MM SCREENING BREAST TOMO BILATERAL      I have discontinued Izora Gala L. Roston's Probiotic Product (PROBIOTIC DAILY PO) and tiZANidine. I am also having her start on metoprolol succinate. Additionally, I am having her maintain her conjugated estrogens, fluticasone, pantoprazole, meloxicam, sertraline, losartan, simvastatin, allopurinol, and HYDROcodone-acetaminophen.  Meds ordered this encounter  Medications  . metoprolol succinate (TOPROL-XL) 25 MG 24 hr tablet    Sig: Take 1 tablet (25 mg total) daily by mouth.    Dispense:  30 tablet     Refill:  0    Medications Discontinued During This Encounter  Medication Reason  . Probiotic Product (PROBIOTIC DAILY PO) Patient has not taken in last 30 days  . tiZANidine (ZANAFLEX) 4 MG capsule Patient has not taken in last 30 days    Follow-up: No Follow-up on file.   Crecencio Mc, MD

## 2017-01-22 ENCOUNTER — Encounter: Payer: Self-pay | Admitting: Internal Medicine

## 2017-01-22 DIAGNOSIS — I700 Atherosclerosis of aorta: Secondary | ICD-10-CM | POA: Insufficient documentation

## 2017-01-22 DIAGNOSIS — R590 Localized enlarged lymph nodes: Secondary | ICD-10-CM | POA: Insufficient documentation

## 2017-01-22 DIAGNOSIS — R0789 Other chest pain: Secondary | ICD-10-CM | POA: Insufficient documentation

## 2017-01-22 LAB — ANGIOTENSIN CONVERTING ENZYME: ANGIOTENSIN-CONVERTING ENZYME: 46 U/L (ref 9–67)

## 2017-01-22 NOTE — Assessment & Plan Note (Addendum)
acconpanied by shortness of breath.  She ruled out for AMI,  But has aortic atherosclerosis.  referal to Cardiology  Advised

## 2017-01-22 NOTE — Assessment & Plan Note (Signed)
LDL and triglycerides  Are at goal on simvastatin   Lab Results  Component Value Date   CHOL 178 01/21/2017   HDL 77.70 01/21/2017   LDLCALC 86 01/21/2017   LDLDIRECT 171.0 04/20/2016   TRIG 70.0 01/21/2017   CHOLHDL 2 01/21/2017   Lab Results  Component Value Date   ALT 17 01/21/2017   AST 21 01/21/2017   ALKPHOS 110 01/21/2017   BILITOT 0.5 01/21/2017

## 2017-01-22 NOTE — Assessment & Plan Note (Signed)
Noted on CT angiogram done Nov 13 by ED for evaluation of atypical chest pain . Continue statin

## 2017-01-22 NOTE — Assessment & Plan Note (Signed)
not at goal on current regimen.  Adding metoprolol XR 25 mg daily

## 2017-01-22 NOTE — Assessment & Plan Note (Signed)
Annual comprehensive preventive exam was done as well as an evaluation and management of chronic conditions .  During the course of the visit the patient was educated and counseled about appropriate screening and preventive services including :  diabetes screening, lipid analysis with projected  10 year  risk for CAD , nutrition counseling, breast, cervical and colorectal cancer screening, and recommended immunizations.  Printed recommendations for health maintenance screenings was given 

## 2017-01-22 NOTE — Assessment & Plan Note (Signed)
due to severe patellofemoral DJD and chondrocalcinosis of the lateral compartment. Receiving steroid injections from Dr Rudene Christians and Rachelle Hora with no improvement.

## 2017-01-22 NOTE — Assessment & Plan Note (Addendum)
Bilateral, with chest pain and dyspnea,  Consider sarcoid,  Checking ACE  Which is normal.  Plan to refer to pulmonology for PFTA and eval if cardiology evaluation is negative for cardiomyopathy and significant CA

## 2017-01-22 NOTE — Assessment & Plan Note (Signed)
Chronic , now with right arm pain  Attributed to radiculopathy .  MRI cervical spine needed

## 2017-01-25 ENCOUNTER — Other Ambulatory Visit: Payer: Self-pay | Admitting: Internal Medicine

## 2017-01-31 ENCOUNTER — Ambulatory Visit: Payer: Self-pay | Admitting: *Deleted

## 2017-01-31 NOTE — Telephone Encounter (Signed)
Did not see in your notes where she needed a follow up for chest nodules but wanted to be sure I did not overlook something?

## 2017-01-31 NOTE — Telephone Encounter (Signed)
They were not lung nodules.  They were subcentimeter ( < 1 cm )  lymph nodes noted,  1 on each side.    There were no masses.  No follow up is needed

## 2017-01-31 NOTE — Telephone Encounter (Signed)
Called in questioning follow up on the nodules in her chest that were found in the ED.  She is for a mammogram but will that show the nodules in her upper chest and what they are.   She is seeing the cardiologist in Jan.    I routed a note to the nurse pool for Dr. Derrel Nip asking them to touch base with her.

## 2017-01-31 NOTE — Telephone Encounter (Signed)
Patient is aware 

## 2017-02-01 ENCOUNTER — Telehealth: Payer: Self-pay | Admitting: Internal Medicine

## 2017-02-01 DIAGNOSIS — Z1231 Encounter for screening mammogram for malignant neoplasm of breast: Secondary | ICD-10-CM | POA: Diagnosis not present

## 2017-02-01 LAB — HM MAMMOGRAPHY

## 2017-02-01 NOTE — Telephone Encounter (Signed)
Copied from [place]. Topic: Quick Communication - See Telephone Encounter >> Feb 01, 2017 12:28 PM Ether Griffins B wrote: CRM for notification. See Telephone encounter for:  Medicare will not cover another colon guard test due to patient having one done two years ago. They only cover every three years. Colon guard states they have sent the results to the office. They will not give pt results. Colon guard states the office can call and get the results.  02/01/17.

## 2017-02-01 NOTE — Telephone Encounter (Signed)
Can you print off the results of last cologuard?

## 2017-02-02 NOTE — Telephone Encounter (Signed)
Pt cologuard from 02/07/2015 was negative. Hardcopy of result has been placed in yellow results folder.

## 2017-02-02 NOTE — Telephone Encounter (Signed)
Printed on [employer].

## 2017-02-02 NOTE — Telephone Encounter (Signed)
The results of patient's cologuard is negative.   We will repeat  It in 3 years   For colon CA screening.

## 2017-02-04 ENCOUNTER — Ambulatory Visit: Payer: Self-pay | Admitting: Physician Assistant

## 2017-02-04 ENCOUNTER — Ambulatory Visit: Payer: Self-pay

## 2017-02-04 DIAGNOSIS — B9689 Other specified bacterial agents as the cause of diseases classified elsewhere: Secondary | ICD-10-CM | POA: Diagnosis not present

## 2017-02-04 DIAGNOSIS — J019 Acute sinusitis, unspecified: Secondary | ICD-10-CM | POA: Diagnosis not present

## 2017-02-04 DIAGNOSIS — J209 Acute bronchitis, unspecified: Secondary | ICD-10-CM | POA: Diagnosis not present

## 2017-02-04 NOTE — Telephone Encounter (Signed)
  Reason for Disposition . [1] MODERATE longstanding difficulty breathing (e.g., speaks in phrases, SOB even at rest, pulse 100-120) AND [2] SAME as normal  Protocols used: BREATHING DIFFICULTY-A-AH  

## 2017-02-04 NOTE — Telephone Encounter (Signed)
After further discussion, pt. Decided to take an acute appointment at [facility].

## 2017-02-04 NOTE — Telephone Encounter (Signed)
  Reason for Disposition . [1] MILD difficulty breathing (e.g., minimal/no SOB at rest, SOB with walking, pulse <100) AND [2] NEW-onset or WORSE than normal  Answer Assessment - Initial Assessment Questions 1. RESPIRATORY STATUS: "Describe your breathing?" (e.g., wheezing, shortness of breath, unable to speak, severe coughing)      Wheezing 2. ONSET: "When did this breathing problem begin?"      Started weeks - last was worse 3. PATTERN "Does the difficult breathing come and go, or has it been constant since it started?"      Feels tight 4. SEVERITY: "How bad is your breathing?" (e.g., mild, moderate, severe)    - MILD: No SOB at rest, mild SOB with walking, speaks normally in sentences, can lay down, no retractions, pulse < 100.    - MODERATE: SOB at rest, SOB with minimal exertion and prefers to sit, cannot lie down flat, speaks in phrases, mild retractions, audible wheezing, pulse 100-120.    - SEVERE: Very SOB at rest, speaks in single words, struggling to breathe, sitting hunched forward, retractions, pulse > 120      Moderate 5. RECURRENT SYMPTOM: "Have you had difficulty breathing before?" If so, ask: "When was the last time?" and "What happened that time?"      Feels congested in her chest. 6. CARDIAC HISTORY: "Do you have any history of heart disease?" (e.g., heart attack, angina, bypass surgery, angioplasty)      No 7. LUNG HISTORY: "Do you have any history of lung disease?"  (e.g., pulmonary embolus, asthma, emphysema)     Asthma -"some asthma". Has an old inhaler 8. CAUSE: "What do you think is causing the breathing problem?"      Unsure 9. OTHER SYMPTOMS: "Do you have any other symptoms? (e.g., dizziness, runny nose, cough, chest pain, fever)     Wheezing 10. PREGNANCY: "Is there any chance you are pregnant?" "When was your last menstrual period?"       No 11. TRAVEL: "Have you traveled out of the country in the last month?" (e.g., travel history, exposures)        No  Protocols used: BREATHING DIFFICULTY-A-AH Pt. Will go to urgent care. No avaliabilty in office.

## 2017-02-16 ENCOUNTER — Telehealth: Payer: Self-pay | Admitting: Internal Medicine

## 2017-02-16 NOTE — Telephone Encounter (Signed)
Copied from [place]. Topic: Quick Communication - Lab Results >> Feb 16, 2017  3:52 PM Burnis Medin, NT wrote: Pt. Is calling because she is waiting to receive her lab results. Pt would like a call back.

## 2017-02-16 NOTE — Telephone Encounter (Signed)
Please advise 

## 2017-02-16 NOTE — Telephone Encounter (Signed)
HER LABS WERE ALL NORMAL

## 2017-02-17 ENCOUNTER — Telehealth: Payer: Self-pay

## 2017-02-17 NOTE — Telephone Encounter (Signed)
Order cancelled

## 2017-02-17 NOTE — Telephone Encounter (Signed)
Cologuard called stating that patient is out of medicare guidelines. Patients can only have cologuard testing done every 3 years. Last one was 02/08/2015. She will not be eligible until 2019. Cologuard is wanting to know if we want to proceed with testing or cancel order. If we proceed they will need medical records faxed over within 10 days.  Patient ref # [id number] Callback # [phone] Select option 1 then 2

## 2017-02-17 NOTE — Telephone Encounter (Signed)
Cancel order

## 2017-02-17 NOTE — Telephone Encounter (Signed)
Patient aware.

## 2017-02-21 ENCOUNTER — Other Ambulatory Visit: Payer: Self-pay | Admitting: Internal Medicine

## 2017-02-21 DIAGNOSIS — M25561 Pain in right knee: Secondary | ICD-10-CM

## 2017-03-01 ENCOUNTER — Other Ambulatory Visit: Payer: Self-pay | Admitting: Internal Medicine

## 2017-03-01 DIAGNOSIS — M25561 Pain in right knee: Secondary | ICD-10-CM

## 2017-03-02 NOTE — Telephone Encounter (Signed)
Last OV 01/21/17 ok to fill meloxicam?

## 2017-03-15 DIAGNOSIS — L821 Other seborrheic keratosis: Secondary | ICD-10-CM | POA: Diagnosis not present

## 2017-03-15 DIAGNOSIS — L920 Granuloma annulare: Secondary | ICD-10-CM | POA: Diagnosis not present

## 2017-03-15 DIAGNOSIS — L814 Other melanin hyperpigmentation: Secondary | ICD-10-CM | POA: Diagnosis not present

## 2017-03-15 DIAGNOSIS — L718 Other rosacea: Secondary | ICD-10-CM | POA: Diagnosis not present

## 2017-03-19 NOTE — Progress Notes (Signed)
Cardiology Office Note  Date:  03/22/2017   ID:  Jamie Anderson, DOB 02/11/1936, MRN [id number]  PCP:  Jamie Mc, MD   Chief Complaint  Patient presents with  . other    ED due to Chest pain. Meds reviewed verbally with pt.    HPI:  [name] is a pleasant 82 year old woman with past medical history of Hypertension Hyperlipidemia Chronic back pain Referred by Dr. Derrel Anderson for symptoms of chest pain  She reports that tachycardia November she had pain under her left breasts, worse with movement of her arm Symptoms lasted for several days Had been trimming bushes, thinks that may make it worse  Pain with deep inspiration Pain would go away then occur with a sharp discomfort Notes indicating she was having pain every several minutes in the emergency room lasting for seconds at a time then resolving Seen in the emergency room January 18, 2017 left-sided chest pain  hospital records reviewed with the patient in detail Workup essentially negative Markedly hypertensive in the emergency room, 205/84 EKG unrevealing   she did have a CT scan in the emergency room   moderate diffuse Aortic atherosclerosis   also with coronary calcifications noted  Since her discharge she has felt fine, has chronic shortness of breath Sedentary, no regular exercise program Weight has been running high  EKG personally reviewed by myself on todays visit Shows normal sinus rhythm rate 78 bpm no significant ST or T-wave changes  Brother with CABG Sister with "open heart surg"   PMH:   has a past medical history of Granuloma annulare, Hyperlipidemia, Hypertension, and Menopause.  PSH:    Past Surgical History:  Procedure Laterality Date  . APPENDECTOMY    . DILATION AND CURETTAGE OF UTERUS    . RIGHT OOPHORECTOMY Right feb 2014   [name]  . [place]  2010  . VENOUS ABLATION     bilateral    Current Outpatient Medications  Medication Sig Dispense Refill  . albuterol (PROVENTIL  HFA;VENTOLIN HFA) 108 (90 Base) MCG/ACT inhaler Inhale 1 puff into the lungs every 6 (six) hours as needed for wheezing or shortness of breath.    . allopurinol (ZYLOPRIM) 100 MG tablet TAKE 1 TABLET BY MOUTH  DAILY 90 tablet 1  . Coenzyme Q10 (CO Q 10 PO) Take by mouth daily.    Marland Kitchen conjugated estrogens (PREMARIN) vaginal cream Place 1 Applicatorful vaginally 2 (two) times a week. 42.5 g 12  . fluticasone (FLONASE) 50 MCG/ACT nasal spray Place 2 sprays into both nostrils daily. 48 g 3  . HYDROcodone-acetaminophen (NORCO/VICODIN) 5-325 MG tablet Take 1 tablet every 6 (six) hours as needed by mouth. 10 tablet 0  . losartan (COZAAR) 100 MG tablet Take 1 tablet (100 mg total) by mouth daily. 90 tablet 1  . meloxicam (MOBIC) 15 MG tablet TAKE 1 TABLET BY MOUTH  DAILY 90 tablet 1  . metoprolol succinate (TOPROL-XL) 25 MG 24 hr tablet Take 1 tablet (25 mg total) daily by mouth. 30 tablet 0  . pantoprazole (PROTONIX) 40 MG tablet TAKE 1 TABLET BY MOUTH  DAILY 90 tablet 0  . sertraline (ZOLOFT) 100 MG tablet TAKE 1 TABLET BY MOUTH  EVERY DAY 90 tablet 1  . simvastatin (ZOCOR) 40 MG tablet TAKE 1 TABLET BY MOUTH AT  BEDTIME 90 tablet 1  . ezetimibe (ZETIA) 10 MG tablet Take 1 tablet (10 mg total) by mouth daily. 90 tablet 4   No current facility-administered medications for this  visit.      Allergies:   Biaxin [clarithromycin]; Corn-containing products; Dust mite extract; Gluten meal; Oat; and Shrimp [shellfish allergy]   Social History:  The patient  reports that  has never smoked. she has never used smokeless tobacco. She reports that she does not drink alcohol or use drugs.   Family History:   family history includes Cancer in her brother; Early death in her mother; Heart attack in her father; Heart disease in her brother, father, and sister.    Review of Systems: Review of Systems  Constitutional: Negative.   Respiratory: Positive for shortness of breath.   Cardiovascular: Positive for chest  pain.  Gastrointestinal: Negative.   Musculoskeletal: Negative.   Neurological: Negative.   Psychiatric/Behavioral: Negative.   All other systems reviewed and are negative.    PHYSICAL EXAM: VS:  BP (!) 158/70 (BP Location: Right Arm, Patient Position: Sitting, Cuff Size: Normal)   Pulse 78   Ht 5\' 4"  (1.626 m)   Wt 188 lb 4 oz (85.4 kg)   BMI 32.31 kg/m  , BMI Body mass index is 32.31 kg/m. GEN: Well nourished, well developed, in no acute distress  obese ,  HEENT: normal  Neck: no JVD, carotid bruits, or masses Cardiac: RRR; no murmurs, rubs, or gallops,no edema  Respiratory:  clear to auscultation bilaterally, normal work of breathing GI: soft, nontender, nondistended, + BS MS: no deformity or atrophy  Skin: warm and dry, no rash Neuro:  Strength and sensation are intact Psych: euthymic mood, full affect    Recent Labs: 01/18/2017: Hemoglobin 12.6; Platelets 188 01/21/2017: ALT 17; BUN 23; Creatinine, Ser 0.97; Potassium 4.4; Sodium 141    Lipid Panel Lab Results  Component Value Date   CHOL 178 01/21/2017   HDL 77.70 01/21/2017   LDLCALC 86 01/21/2017   TRIG 70.0 01/21/2017      Wt Readings from Last 3 Encounters:  03/22/17 188 lb 4 oz (85.4 kg)  01/21/17 189 lb 9.6 oz (86 kg)  01/18/17 180 lb (81.6 kg)       ASSESSMENT AND PLAN:  Atypical chest pain - Plan: EKG 12-Lead Atypical in nature, likely musculoskeletal after trimming the bushes CT scan chest reviewed with her, images pulled up in the office,   there is coronary calcification Recommended if she has worsening shortness of breath that she call our office for stress testing Suggested she start low-dose aspirin 81 mg daily  Aortic atherosclerosis ([place]) - Plan: EKG 12-Lead Moderate aortic atherosclerosis Suggested she stay on her statin, add Zetia 10 mg daily  Essential hypertension - Plan: EKG 12-Lead Pressure elevated , mildly improved on recheck  Previous notes reviewed showing pressures  ranging from 130 at 140, occasional 150  Suggested she monitor blood pressure at home and call our office if numbers continue to run high  Additional medications could include calcium channel blockers, HCTZ among others  Hyperlipidemia Continue on statin, add Zetia 10 mg daily, goal LDL less than 70 given degree of aortic atherosclerosis and coronary calcification  Shortness of breath  suspect secondary to deconditioning, obesity   we have recommended regular walking/exercise program She can walk around her house, go to a gym near her Silver sneakers  Disposition:   F/U  as needed    Total encounter time more than 45 minutes  Greater than 50% was spent in counseling and coordination of care with the patient    Orders Placed This Encounter  Procedures  . EKG 12-Lead  Signed, Esmond Plants, M.D., Ph.D. 03/22/2017  [place], [place]

## 2017-03-22 ENCOUNTER — Encounter: Payer: Self-pay | Admitting: Cardiovascular Disease

## 2017-03-22 ENCOUNTER — Ambulatory Visit: Payer: Medicare Other | Admitting: Cardiovascular Disease

## 2017-03-22 VITALS — BP 158/70 | HR 78 | Ht 64.0 in | Wt 188.2 lb

## 2017-03-22 DIAGNOSIS — I700 Atherosclerosis of aorta: Secondary | ICD-10-CM | POA: Diagnosis not present

## 2017-03-22 DIAGNOSIS — R0602 Shortness of breath: Secondary | ICD-10-CM | POA: Insufficient documentation

## 2017-03-22 DIAGNOSIS — I10 Essential (primary) hypertension: Secondary | ICD-10-CM

## 2017-03-22 DIAGNOSIS — E785 Hyperlipidemia, unspecified: Secondary | ICD-10-CM

## 2017-03-22 DIAGNOSIS — R0789 Other chest pain: Secondary | ICD-10-CM

## 2017-03-22 MED ORDER — EZETIMIBE 10 MG PO TABS: 10.0000 mg | ORAL_TABLET | Freq: Every day | ORAL | 4 refills | Status: DC

## 2017-03-22 NOTE — Patient Instructions (Addendum)
Please monitor blood perssure Call if blood pressure remains >150   Medication Instructions:   Please start zetia one a day for cholesterol  Aspirin 81 mg coated daily  Labwork:  No new labs needed  Testing/Procedures:  No further testing at this time   Follow-Up: It was a pleasure seeing you in the office today. Please call us if you have new issues that need to be addressed before your next appt.  [phone]  Your physician wants you to follow-up in:  As needed  If you need a refill on your cardiac medications before your next appointment, please call your pharmacy.

## 2017-04-11 ENCOUNTER — Other Ambulatory Visit: Payer: Self-pay | Admitting: Internal Medicine

## 2017-04-21 ENCOUNTER — Ambulatory Visit: Payer: Medicare Other

## 2017-04-22 ENCOUNTER — Ambulatory Visit: Payer: Medicare Other

## 2017-05-11 ENCOUNTER — Other Ambulatory Visit: Payer: Self-pay | Admitting: Internal Medicine

## 2017-05-17 ENCOUNTER — Ambulatory Visit: Payer: Medicare Other

## 2017-05-31 ENCOUNTER — Ambulatory Visit (INDEPENDENT_AMBULATORY_CARE_PROVIDER_SITE_OTHER): Payer: Medicare Other

## 2017-05-31 VITALS — BP 136/70 | HR 81 | Temp 98.4°F | Resp 14 | Ht 64.0 in | Wt 193.0 lb

## 2017-05-31 DIAGNOSIS — Z0000 Encounter for general adult medical examination without abnormal findings: Secondary | ICD-10-CM | POA: Diagnosis not present

## 2017-05-31 NOTE — Progress Notes (Signed)
Subjective:   Jamie Anderson is a 82 y.o. female who presents for Medicare Annual (Subsequent) preventive examination.  Review of Systems:  No ROS.  Medicare Wellness Visit. Additional risk factors are reflected in the social history.  Cardiac Risk Factors include: advanced age (>61men, >31 women);hypertension;obesity (BMI >30kg/m2)     Objective:     Vitals: BP 136/70 (BP Location: Left Arm, Patient Position: Sitting, Cuff Size: Normal)   Pulse 81   Temp 98.4 F (36.9 C) (Oral)   Resp 14   Ht 5\' 4"  (1.626 m)   Wt 193 lb (87.5 kg)   SpO2 95%   BMI 33.13 kg/m   Body mass index is 33.13 kg/m.  Advanced Directives 05/31/2017 01/18/2017 04/20/2016  Does Patient Have a Medical Advance Directive? Yes Yes Yes  Type of Paramedic of [place];Living will [place];Living will [place];Living will  Does patient want to make changes to medical advance directive? No - Patient declined - No - Patient declined  Copy of [place] in Chart? No - copy requested No - copy requested No - copy requested  Would patient like information on creating a medical advance directive? - No - Patient declined -    Tobacco Social History   Tobacco Use  Smoking Status Never Smoker  Smokeless Tobacco Never Used     Counseling given: Not Answered   Clinical Intake:  Pre-visit preparation completed: Yes  Pain : No/denies pain     Nutritional Status: BMI > 30  Obese Diabetes: No  How often do you need to have someone help you when you read instructions, pamphlets, or other written materials from your doctor or pharmacy?: 1 - Never  Interpreter Needed?: No     Past Medical History:  Diagnosis Date  . Granuloma annulare   . Hyperlipidemia   . Hypertension   . Menopause    Past Surgical History:  Procedure Laterality Date  . APPENDECTOMY    . DILATION AND CURETTAGE OF UTERUS    . RIGHT OOPHORECTOMY  Right feb 2014   [name]  . [place]  2010  . VENOUS ABLATION     bilateral   Family History  Problem Relation Age of Onset  . Early death Mother        durig childbirth  . Heart disease Father   . Heart attack Father   . Heart disease Sister   . Cancer Brother   . Heart disease Brother        Afib   Social History   Socioeconomic History  . Marital status: Widowed    Spouse name: Not on file  . Number of children: Not on file  . Years of education: Not on file  . Highest education level: Not on file  Occupational History  . Not on file  Social Needs  . Financial resource strain: Not hard at all  . Food insecurity:    Worry: Never true    Inability: Never true  . Transportation needs:    Medical: No    Non-medical: No  Tobacco Use  . Smoking status: Never Smoker  . Smokeless tobacco: Never Used  Substance and Sexual Activity  . Alcohol use: No    Alcohol/week: 2.4 oz    Types: 4 Glasses of wine per week    Frequency: Never  . Drug use: No  . Sexual activity: Not Currently  Lifestyle  . Physical activity:  Days per week: 0 days    Minutes per session: Not on file  . Stress: Not on file  Relationships  . Social connections:    Talks on phone: Not on file    Gets together: Not on file    Attends religious service: Not on file    Active member of club or organization: Not on file    Attends meetings of clubs or organizations: Not on file    Relationship status: Not on file  Other Topics Concern  . Not on file  Social History Narrative  . Not on file    Outpatient Encounter Medications as of 05/31/2017  Medication Sig  . albuterol (PROVENTIL HFA;VENTOLIN HFA) 108 (90 Base) MCG/ACT inhaler Inhale 1 puff into the lungs every 6 (six) hours as needed for wheezing or shortness of breath.  . allopurinol (ZYLOPRIM) 100 MG tablet TAKE 1 TABLET BY MOUTH  DAILY  . Coenzyme Q10 (CO Q 10 PO) Take by mouth daily.  Marland Kitchen conjugated estrogens (PREMARIN) vaginal cream  Place 1 Applicatorful vaginally 2 (two) times a week.  . ezetimibe (ZETIA) 10 MG tablet Take 1 tablet (10 mg total) by mouth daily.  . fluticasone (FLONASE) 50 MCG/ACT nasal spray Place 2 sprays into both nostrils daily.  Marland Kitchen HYDROcodone-acetaminophen (NORCO/VICODIN) 5-325 MG tablet Take 1 tablet every 6 (six) hours as needed by mouth.  . losartan (COZAAR) 100 MG tablet TAKE 1 TABLET BY MOUTH  DAILY  . meloxicam (MOBIC) 15 MG tablet TAKE 1 TABLET BY MOUTH  DAILY  . metoprolol succinate (TOPROL-XL) 25 MG 24 hr tablet Take 1 tablet (25 mg total) daily by mouth.  . pantoprazole (PROTONIX) 40 MG tablet TAKE 1 TABLET BY MOUTH  DAILY  . sertraline (ZOLOFT) 100 MG tablet TAKE 1 TABLET BY MOUTH  EVERY DAY  . simvastatin (ZOCOR) 40 MG tablet TAKE 1 TABLET BY MOUTH AT  BEDTIME   No facility-administered encounter medications on file as of 05/31/2017.     Activities of Daily Living In your present state of health, do you have any difficulty performing the following activities: 05/31/2017  Hearing? N  Vision? N  Difficulty concentrating or making decisions? N  Walking or climbing stairs? Y  Comment knee pain, intermittent  Dressing or bathing? N  Doing errands, shopping? N  Preparing Food and eating ? N  Using the Toilet? N  In the past six months, have you accidently leaked urine? N  Do you have problems with loss of bowel control? N  Managing your Medications? N  Managing your Finances? N  Housekeeping or managing your Housekeeping? N  Some recent data might be hidden    Patient Care Team: Crecencio Mc, MD as PCP - General (Internal Medicine)    Assessment:   This is a routine wellness examination for [name].  The goal of the wellness visit is to assist the patient how to close the gaps in care and create a preventative care plan for the patient.   The roster of all physicians providing medical care to patient is listed in the Snapshot section of the chart.  Osteoporosis risk reviewed.     Safety issues reviewed; Smoke and carbon monoxide detectors in the home. No firearms in the home. Wears seatbelts when driving or riding with others. No violence in the home.  They do not have excessive sun exposure.  Discussed the need for sun protection: hats, long sleeves and the use of sunscreen if there is significant sun exposure.  Patient is alert, normal appearance, oriented to person/place/and time.  Correctly identified the president of the [place] and recalls of 1/3 words. Performs simple calculations and can read correct time from watch face.  Displays appropriate judgement.  No new identified risk were noted.  No failures at ADL's or IADL's.  Ambulates with cane as needed.  BMI- discussed the importance of a healthy diet, water intake and the benefits of aerobic exercise. Educational material provided.   24 hour diet recall: Regular diet  Dental- every 6 months.  Eye- Visual acuity not assessed per patient preference since they have regular follow up with the ophthalmologist.  Wears corrective lenses.  Sleep patterns- Sleeps through the night without issues.  .  Health maintenance gaps- closed.  Patient Concerns: None at this time. Follow up with PCP as needed.  Exercise Activities and Dietary recommendations Current Exercise Habits: The patient does not participate in regular exercise at present  Goals    . DIET - REDUCE SUGAR INTAKE     Portion control       Fall Risk Fall Risk  05/31/2017 04/20/2016 01/16/2016 01/15/2015 01/03/2014  Falls in the past year? No No No No No   Depression Screen PHQ 2/9 Scores 05/31/2017 04/20/2016 01/16/2016 01/15/2015  PHQ - 2 Score 0 0 0 0     Cognitive Function MMSE - Mini Mental State Exam 04/20/2016  Orientation to time 5  Orientation to Place 5  Registration 3  Attention/ Calculation 5  Recall 3  Language- name 2 objects 2  Language- repeat 1  Language- follow 3 step command 3  Language- read & follow direction  1  Write a sentence 1  Copy design 1  Total score 30     6CIT Screen 05/31/2017  What Year? 0 points  What month? 0 points  What time? 0 points  Count back from 20 0 points  Months in reverse 2 points  Repeat phrase 0 points  Total Score 2    Immunization History  Administered Date(s) Administered  . Influenza Split 11/25/2011  . Influenza,inj,Quad PF,6+ Mos 11/22/2012  . Influenza-Unspecified 12/20/2013, 01/07/2015, 01/09/2016, 12/15/2016  . Pneumococcal Conjugate-13 01/03/2014  . Pneumococcal Polysaccharide-23 11/25/2011  . Tdap 12/06/2013  . Zoster 12/29/2009    Screening Tests Health Maintenance  Topic Date Due  . TETANUS/TDAP  12/07/2023  . INFLUENZA VACCINE  Completed  . DEXA SCAN  Completed  . PNA vac Low Risk Adult  Completed      Plan:    End of life planning; Advance aging; Advanced directives discussed. Copy of current HCPOA/Living Will requested.    I have personally reviewed and noted the following in the patient's chart:   . Medical and social history . Use of alcohol, tobacco or illicit drugs  . Current medications and supplements . Functional ability and status . Nutritional status . Physical activity . Advanced directives . List of other physicians . Hospitalizations, surgeries, and ER visits in previous 12 months . Vitals . Screenings to include cognitive, depression, and falls . Referrals and appointments  In addition, I have reviewed and discussed with patient certain preventive protocols, quality metrics, and best practice recommendations. A written personalized care plan for preventive services as well as general preventive health recommendations were provided to patient.     Varney Biles, LPN  06/19/2438

## 2017-05-31 NOTE — Patient Instructions (Addendum)
  Ms. Hoff , Thank you for taking time to come for your Medicare Wellness Visit. I appreciate your ongoing commitment to your health goals. Please review the following plan we discussed and let me know if I can assist you in the future.   These are the goals we discussed: Goals    . DIET - REDUCE SUGAR INTAKE     Portion control       This is a list of the screening recommended for you and due dates:  Health Maintenance  Topic Date Due  . Tetanus Vaccine  12/07/2023  . Flu Shot  Completed  . DEXA scan (bone density measurement)  Completed  . Pneumonia vaccines  Completed

## 2017-06-06 ENCOUNTER — Other Ambulatory Visit: Payer: Self-pay

## 2017-06-06 ENCOUNTER — Encounter: Payer: Self-pay | Admitting: Internal Medicine

## 2017-06-06 ENCOUNTER — Ambulatory Visit (INDEPENDENT_AMBULATORY_CARE_PROVIDER_SITE_OTHER): Payer: Medicare Other | Admitting: Internal Medicine

## 2017-06-06 VITALS — BP 138/72 | HR 85 | Temp 97.8°F | Wt 190.4 lb

## 2017-06-06 DIAGNOSIS — R350 Frequency of micturition: Secondary | ICD-10-CM | POA: Diagnosis not present

## 2017-06-06 DIAGNOSIS — R590 Localized enlarged lymph nodes: Secondary | ICD-10-CM

## 2017-06-06 DIAGNOSIS — R0609 Other forms of dyspnea: Secondary | ICD-10-CM

## 2017-06-06 DIAGNOSIS — E785 Hyperlipidemia, unspecified: Secondary | ICD-10-CM | POA: Diagnosis not present

## 2017-06-06 DIAGNOSIS — R5383 Other fatigue: Secondary | ICD-10-CM

## 2017-06-06 DIAGNOSIS — I700 Atherosclerosis of aorta: Secondary | ICD-10-CM

## 2017-06-06 DIAGNOSIS — I10 Essential (primary) hypertension: Secondary | ICD-10-CM

## 2017-06-06 DIAGNOSIS — M1A0710 Idiopathic chronic gout, right ankle and foot, without tophus (tophi): Secondary | ICD-10-CM | POA: Diagnosis not present

## 2017-06-06 MED ORDER — HYDROCHLOROTHIAZIDE 25 MG PO TABS: 25.0000 mg | ORAL_TABLET | Freq: Every day | ORAL | 1 refills | Status: DC

## 2017-06-06 MED ORDER — LOSARTAN POTASSIUM-HCTZ 100-12.5 MG PO TABS
1.0000 | ORAL_TABLET | Freq: Every day | ORAL | 3 refills | Status: DC
Start: 2017-06-06 — End: 2018-01-30

## 2017-06-06 NOTE — Progress Notes (Signed)
Subjective:  Patient ID: Jamie Anderson, female    DOB: 24-Jan-1936  Age: 82 y.o. MRN: [id number]  CC: The primary encounter diagnosis was Fatigue, unspecified type. 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  HPI Jamie Anderson presents for follow up on multiple issues including exertional dyspnea, hilar adenopathy noted on Nov CT, and hypertension .  Missed her medicatio yesterday ,  Too it today at noon,    Saw Gollan in Nov after ER visit for atypical chest pain and hypertensive urgency with BP of 202/54 .  Reviewed his visit with her along with the ER visit:.  ECHO was not done     Coronary and aortic  Atherosclerosis noted on chest CT.  Primary prevention with daily  Asa 81 mg advised,  And zetia added to statin  For goal LDL 70 or less   rec adding amlodipine or hctz if needed     Not walking regularly , but has walked for 30 minutes once a few weeks ago when the weather was warmer and denies c/p,  Dyspnea.  Avoids walking due to "bad knees."  Discussed benefits of using a stationery bike   Outpatient Medications Prior to Visit  Medication Sig Dispense Refill  . albuterol (PROVENTIL HFA;VENTOLIN HFA) 108 (90 Base) MCG/ACT inhaler Inhale 1 puff into the lungs every 6 (six) hours as needed for wheezing or shortness of breath.    . allopurinol (ZYLOPRIM) 100 MG tablet TAKE 1 TABLET BY MOUTH  DAILY 90 tablet 1  . Coenzyme Q10 (CO Q 10 PO) Take by mouth daily.    Marland Kitchen conjugated estrogens (PREMARIN) vaginal cream Place 1 Applicatorful vaginally 2 (two) times a week. 42.5 g 12  . ezetimibe (ZETIA) 10 MG tablet Take 1 tablet (10 mg total) by mouth daily. 90 tablet 4  . fluticasone (FLONASE) 50 MCG/ACT nasal spray Place 2 sprays into both nostrils daily. 48 g 3  . HYDROcodone-acetaminophen  (NORCO/VICODIN) 5-325 MG tablet Take 1 tablet every 6 (six) hours as needed by mouth. 10 tablet 0  . meloxicam (MOBIC) 15 MG tablet TAKE 1 TABLET BY MOUTH  DAILY 90 tablet 1  . metoprolol succinate (TOPROL-XL) 25 MG 24 hr tablet Take 1 tablet (25 mg total) daily by mouth. 30 tablet 0  . pantoprazole (PROTONIX) 40 MG tablet TAKE 1 TABLET BY MOUTH  DAILY 90 tablet 1  . sertraline (ZOLOFT) 100 MG tablet TAKE 1 TABLET BY MOUTH  EVERY DAY 90 tablet 1  . simvastatin (ZOCOR) 40 MG tablet TAKE 1 TABLET BY MOUTH AT  BEDTIME 90 tablet 1  . losartan (COZAAR) 100 MG tablet TAKE 1 TABLET BY MOUTH  DAILY 90 tablet 1   No facility-administered medications prior to visit.     Review of Systems;  Patient denies headache, fevers, malaise, unintentional weight loss, skin rash, eye pain, sinus congestion and sinus pain, sore throat, dysphagia,  hemoptysis , cough, dyspnea, wheezing, chest pain, palpitations, orthopnea, edema, abdominal pain, nausea, melena, diarrhea, constipation, flank pain, dysuria, hematuria, urinary  Frequency, nocturia, numbness, tingling, seizures,  Focal weakness, Loss of consciousness,  Tremor, insomnia, depression, anxiety, and suicidal ideation.      Objective:  BP 138/72 (BP Location: Left Arm, Patient Position: Sitting, Cuff Size: Normal)   Pulse 85   Temp 97.8 F (36.6 C)   Wt 190 lb 6.4  oz (86.4 kg)   SpO2 98%   BMI 32.68 kg/m   BP Readings from Last 3 Encounters:  06/06/17 138/72  05/31/17 136/70  03/22/17 (!) 158/70    Wt Readings from Last 3 Encounters:  06/06/17 190 lb 6.4 oz (86.4 kg)  05/31/17 193 lb (87.5 kg)  03/22/17 188 lb 4 oz (85.4 kg)    General appearance: alert, cooperative and appears stated age Ears: normal TM's and external ear canals both ears Throat: lips, mucosa, and tongue normal; teeth and gums normal Neck: no adenopathy, no carotid bruit, supple, symmetrical, trachea midline and thyroid not enlarged, symmetric, no  tenderness/mass/nodules Back: symmetric, no curvature. ROM normal. No CVA tenderness. Lungs: clear to auscultation bilaterally Heart: regular rate and rhythm, S1, S2 normal, no murmur, click, rub or gallop Abdomen: soft, non-tender; bowel sounds normal; no masses,  no organomegaly Pulses: 2+ and symmetric Skin: Skin color, texture, turgor normal. No rashes or lesions Lymph nodes: Cervical, supraclavicular, and axillary nodes normal.  Lab Results  Component Value Date   HGBA1C 5.4 01/15/2015    Lab Results  Component Value Date   CREATININE 0.94 06/06/2017   CREATININE 0.97 01/21/2017   CREATININE 0.85 01/18/2017    Lab Results  Component Value Date   WBC 6.0 01/18/2017   HGB 12.6 01/18/2017   HCT 37.5 01/18/2017   PLT 188 01/18/2017   GLUCOSE 80 06/06/2017   CHOL 160 06/06/2017   TRIG 82.0 06/06/2017   HDL 85.60 06/06/2017   LDLDIRECT 171.0 04/20/2016   LDLCALC 58 06/06/2017   ALT 17 06/06/2017   AST 21 06/06/2017   NA 139 06/06/2017   K 3.9 06/06/2017   CL 105 06/06/2017   CREATININE 0.94 06/06/2017   BUN 23 06/06/2017   CO2 28 06/06/2017   TSH 1.72 06/06/2017   INR 0.9 05/29/2012   HGBA1C 5.4 01/15/2015   MICROALBUR <0.7 04/20/2016    Dg Chest 2 View  Result Date: 01/18/2017 CLINICAL DATA:  Chest pain EXAM: CHEST  2 VIEW COMPARISON:  04/10/2015 FINDINGS: Mild cardiomegaly with central congestion. No consolidation or effusion. Increased bronchitic changes. No pneumothorax. IMPRESSION: 1. Mild diffuse increased bronchitic changes compared to prior 2. No focal pulmonary infiltrate 3. Mild cardiomegaly with slight central vascular congestion Electronically Signed   By: Donavan Foil M.D.   On: 01/18/2017 17:09   Ct Angio Chest Pe W And/or Wo Contrast  Result Date: 01/18/2017 CLINICAL DATA:  Chest pain. EXAM: CT ANGIOGRAPHY CHEST WITH CONTRAST TECHNIQUE: Multidetector CT imaging of the chest was performed using the standard protocol during bolus administration of  intravenous contrast. Multiplanar CT image reconstructions and MIPs were obtained to evaluate the vascular anatomy. CONTRAST:  72mL ISOVUE-370 IOPAMIDOL (ISOVUE-370) INJECTION 76% COMPARISON:  None. FINDINGS: Cardiovascular: Satisfactory opacification of the pulmonary arteries to the segmental level. No evidence of pulmonary embolism. Normal heart size. No pericardial effusion. Aortic atherosclerosis noted. Mediastinum/Nodes: The trachea appears patent and is midline. Normal appearance of the esophagus. No mediastinal adenopathy. 9 mm left hilar lymph node identified. 9 mm right hilar lymph node also noted. Lungs/Pleura: No pleural effusion identified. No airspace consolidation or atelectasis. Biapical pleural and parenchymal scarring identified. Upper Abdomen: Unchanged right adrenal nodule from 02/06/2014 compatible with a benign adenoma. Musculoskeletal: No chest wall abnormality. No acute or significant osseous findings. Review of the MIP images confirms the above findings. IMPRESSION: 1. No acute cardiopulmonary abnormalities. No evidence for acute pulmonary embolus. 2.  Aortic Atherosclerosis (ICD10-I70.0). Electronically Signed   By: Lovena Le  Clovis Riley M.D.   On: 01/18/2017 20:27    Assessment & Plan:   Problem List Items Addressed This Visit    Gout   Relevant Orders   Uric acid (Completed)   Hyperlipidemia LDL goal <130   Relevant Medications   losartan-hydrochlorothiazide (HYZAAR) 100-12.5 MG tablet   Other Relevant Orders   Lipid panel (Completed)   Hypertension    Not at goal.  Adding hctz 12.5 mg daily to losartan .  Lab Results  Component Value Date   CREATININE 0.94 06/06/2017   Lab Results  Component Value Date   NA 139 06/06/2017   K 3.9 06/06/2017   CL 105 06/06/2017   CO2 28 06/06/2017         Relevant Medications   losartan-hydrochlorothiazide (HYZAAR) 100-12.5 MG tablet   Hilar adenopathy    Serum ACE was normal.  Cardiology evaluation was done ,  But no ECHO done  to evaluate dyspnea and chest pain.  Will refer to Pulmonology for their opinion       Relevant Orders   Ambulatory referral to Pulmonology   Aortic atherosclerosis ([place])    Noted on CT angiogram done Nov 13 by ED for evaluation of atypical chest pain .LL is now < 70.  Continue statin and zetia.  Repeat lipids are due  Lab Results  Component Value Date   CHOL 160 06/06/2017   HDL 85.60 06/06/2017   LDLCALC 58 06/06/2017   LDLDIRECT 171.0 04/20/2016   TRIG 82.0 06/06/2017   CHOLHDL 2 06/06/2017        Relevant Medications   losartan-hydrochlorothiazide (HYZAAR) 100-12.5 MG tablet    Other Visit Diagnoses    Fatigue, unspecified type    -  Primary   Relevant Orders   Comprehensive metabolic panel (Completed)   TSH (Completed)   Urinary frequency       Relevant Orders   Urinalysis, Routine w reflex microscopic (Completed)   Urine Culture   Exertional dyspnea       Relevant Orders   Ambulatory referral to Pulmonology      I have discontinued Izora [place]'s losartan and hydrochlorothiazide. I am also having her start on losartan-hydrochlorothiazide. Additionally, I am having her maintain her conjugated estrogens, fluticasone, allopurinol, HYDROcodone-acetaminophen, metoprolol succinate, sertraline, meloxicam, Coenzyme Q10 (CO Q 10 PO), albuterol, ezetimibe, pantoprazole, and simvastatin.  Meds ordered this encounter  Medications  . DISCONTD: hydrochlorothiazide (HYDRODIURIL) 25 MG tablet    Sig: Take 1 tablet (25 mg total) by mouth daily.    Dispense:  30 tablet    Refill:  1  . losartan-hydrochlorothiazide (HYZAAR) 100-12.5 MG tablet    Sig: Take 1 tablet by mouth daily.    Dispense:  90 tablet    Refill:  3    Medications Discontinued During This Encounter  Medication Reason  . hydrochlorothiazide (HYDRODIURIL) 25 MG tablet   . losartan (COZAAR) 100 MG tablet    A total of 25 minutes of face to face time was spent with patient more than half of which was spent  in counselling about the above mentioned conditions  and coordination of care   Follow-up: Return in about 6 months (around 12/06/2017) for hypertension, hyperlipidemia.   Crecencio Mc, MD

## 2017-06-06 NOTE — Patient Instructions (Addendum)
Take the metoprolol at bedtime   For blood pressure starting tonight!    changing losartan to losartan./hctz   With your morning meal   We are checking  lipids today  An exercise bike  Is a great alternative to walking

## 2017-06-07 ENCOUNTER — Encounter: Payer: Self-pay | Admitting: Internal Medicine

## 2017-06-07 ENCOUNTER — Telehealth: Payer: Self-pay | Admitting: Internal Medicine

## 2017-06-07 ENCOUNTER — Other Ambulatory Visit: Payer: Self-pay

## 2017-06-07 ENCOUNTER — Encounter: Payer: Self-pay | Admitting: Physician Assistant

## 2017-06-07 DIAGNOSIS — R350 Frequency of micturition: Secondary | ICD-10-CM | POA: Insufficient documentation

## 2017-06-07 LAB — URINE CULTURE
MICRO NUMBER: 90400512
SPECIMEN QUALITY:: ADEQUATE

## 2017-06-07 LAB — COMPREHENSIVE METABOLIC PANEL
ALT: 17 U/L (ref 0–35)
AST: 21 U/L (ref 0–37)
Albumin: 4.2 g/dL (ref 3.5–5.2)
Alkaline Phosphatase: 117 U/L (ref 39–117)
BUN: 23 mg/dL (ref 6–23)
CALCIUM: 9.6 mg/dL (ref 8.4–10.5)
CHLORIDE: 105 meq/L (ref 96–112)
CO2: 28 meq/L (ref 19–32)
Creatinine, Ser: 0.94 mg/dL (ref 0.40–1.20)
GFR: 60.96 mL/min (ref >60.00)
GLUCOSE: 80 mg/dL (ref 70–99)
POTASSIUM: 3.9 meq/L (ref 3.5–5.1)
Sodium: 139 mEq/L (ref 135–145)
Total Bilirubin: 0.5 mg/dL (ref 0.2–1.2)
Total Protein: 7 g/dL (ref 6.0–8.3)

## 2017-06-07 LAB — LIPID PANEL
CHOL/HDL RATIO: 2
Cholesterol: 160 mg/dL (ref 0–200)
HDL: 85.6 mg/dL (ref >39.00)
LDL CALC: 58 mg/dL (ref 0–99)
NONHDL: 74.48
TRIGLYCERIDES: 82 mg/dL (ref 0.0–149.0)
VLDL: 16.4 mg/dL (ref 0.0–40.0)

## 2017-06-07 LAB — URINALYSIS, ROUTINE W REFLEX MICROSCOPIC
BACTERIA UA: NONE SEEN
Bilirubin Urine: NEGATIVE
HGB URINE DIPSTICK: NEGATIVE
Leukocytes, UA: NEGATIVE
Nitrite: NEGATIVE
RBC / HPF: NONE SEEN (ref 0–?)
Specific Gravity, Urine: 1.02 (ref 1.000–1.030)
Total Protein, Urine: NEGATIVE
URINE GLUCOSE: NEGATIVE
UROBILINOGEN UA: 0.2 (ref 0.0–1.0)
pH: 6.5 (ref 5.0–8.0)

## 2017-06-07 LAB — TSH: TSH: 1.72 u[IU]/mL (ref 0.35–4.50)

## 2017-06-07 LAB — URIC ACID: Uric Acid, Serum: 5.5 mg/dL (ref 2.4–7.0)

## 2017-06-07 MED ORDER — METOPROLOL SUCCINATE ER 25 MG PO TB24: 25.0000 mg | ORAL_TABLET | Freq: Every day | ORAL | 1 refills | Status: DC

## 2017-06-07 NOTE — Assessment & Plan Note (Signed)
She has pyuria; culture is pending

## 2017-06-07 NOTE — Assessment & Plan Note (Signed)
Serum ACE was normal.  Cardiology evaluation was done ,  But no ECHO done to evaluate dyspnea and chest pain.  Will refer to Pulmonology for their opinion

## 2017-06-07 NOTE — Telephone Encounter (Signed)
Copied from [place]. Topic: Quick Communication - Rx Refill/Question >> Jun 07, 2017 10:59 AM Cecelia Byars, NT wrote: Medication: metoprolol succinate (TOPROL-XL) 25 MG 24 hr tablet Has the patient contacted their pharmacy? {yes  (Agent: If no, request that the patient contact the pharmacy for the refill Preferred Pharmacy (with phone number or street name):[place], [place] [phone] (Phone) [phone] (Fax) Agent: Please be advised that RX refills may take up to 3 business days. We ask that you follow-up with your pharmacy. Patient says she was not given a refill on the above medication ,with her other medications

## 2017-06-07 NOTE — Assessment & Plan Note (Signed)
Not at goal.  Adding hctz 12.5 mg daily to losartan .  Lab Results  Component Value Date   CREATININE 0.94 06/06/2017   Lab Results  Component Value Date   NA 139 06/06/2017   K 3.9 06/06/2017   CL 105 06/06/2017   CO2 28 06/06/2017

## 2017-06-07 NOTE — Assessment & Plan Note (Signed)
Noted on CT angiogram done Nov 13 by ED for evaluation of atypical chest pain .LL is now < 70.  Continue statin and zetia.  Repeat lipids are due  Lab Results  Component Value Date   CHOL 160 06/06/2017   HDL 85.60 06/06/2017   LDLCALC 58 06/06/2017   LDLDIRECT 171.0 04/20/2016   TRIG 82.0 06/06/2017   CHOLHDL 2 06/06/2017

## 2017-06-13 ENCOUNTER — Encounter: Payer: Self-pay | Admitting: Internal Medicine

## 2017-06-13 ENCOUNTER — Institutional Professional Consult (permissible substitution): Payer: Medicare Other | Admitting: Internal Medicine

## 2017-06-13 ENCOUNTER — Ambulatory Visit: Payer: Medicare Other | Admitting: Internal Medicine

## 2017-06-13 VITALS — BP 154/88 | HR 78 | Ht 64.0 in | Wt 189.0 lb

## 2017-06-13 DIAGNOSIS — J4520 Mild intermittent asthma, uncomplicated: Secondary | ICD-10-CM | POA: Diagnosis not present

## 2017-06-13 MED ORDER — ANORO ELLIPTA 62.5-25 MCG/INH IN AEPB: 1.0000 | INHALATION_SPRAY | Freq: Every day | RESPIRATORY_TRACT | 6 refills | Status: DC

## 2017-06-13 NOTE — Progress Notes (Signed)
Name: Jamie Anderson MRN: [id number] DOB: [birth year]     CONSULTATION DATE: 4.8.19 REFERRING MD : Derrel Nip  CHIEF COMPLAINT: SOB and wheezing  STUDIES:    11.18.18 CT chest Independently reviewed by Me No acute process No pneumonia No masses seen   HISTORY OF PRESENT ILLNESS: 82 yo pleasant white female seen today for intermittent wheezing and SOB +DOE as well  Symptoms started several months ago has gotten better but has persistent symptoms  Patient has 20 years of second hand smoke exposure Patient has had 40 years of environmental exposure with Upholstery  She has no fevers, chills, NVD at this time  She has no acute infections at this time  She has been prescribed albuterol as needed and has helped her symptoms She had a CT chest to r/o PE in 01/2017    PAST MEDICAL HISTORY :   has a past medical history of Granuloma annulare, Hyperlipidemia, Hypertension, and Menopause.  has a past surgical history that includes Spine surgery (2010); Appendectomy; Venous ablation; Dilation and curettage of uterus; and Right oophorectomy (Right, feb 2014). Prior to Admission medications   Medication Sig Start Date End Date Taking? Authorizing Provider  albuterol (PROVENTIL HFA;VENTOLIN HFA) 108 (90 Base) MCG/ACT inhaler Inhale 1 puff into the lungs every 6 (six) hours as needed for wheezing or shortness of breath.   Yes [provider]  allopurinol (ZYLOPRIM) 100 MG tablet TAKE 1 TABLET BY MOUTH  DAILY 12/27/16  Yes Crecencio Mc, MD  Coenzyme Q10 (CO Q 10 PO) Take by mouth daily.   Yes [provider]  conjugated estrogens (PREMARIN) vaginal cream Place 1 Applicatorful vaginally 2 (two) times a week. 01/19/16  Yes Crecencio Mc, MD  ezetimibe (ZETIA) 10 MG tablet Take 1 tablet (10 mg total) by mouth daily. 03/22/17  Yes Gollan, Kathlene November, MD  fluticasone (FLONASE) 50 MCG/ACT nasal spray Place 2 sprays into both nostrils daily. 06/25/16  Yes Crecencio Mc, MD    HYDROcodone-acetaminophen (NORCO/VICODIN) 5-325 MG tablet Take 1 tablet every 6 (six) hours as needed by mouth. 01/18/17  Yes Harvest Dark, MD  losartan-hydrochlorothiazide (HYZAAR) 100-12.5 MG tablet Take 1 tablet by mouth daily. 06/06/17  Yes Crecencio Mc, MD  meloxicam (MOBIC) 15 MG tablet TAKE 1 TABLET BY MOUTH  DAILY 03/02/17  Yes Crecencio Mc, MD  metoprolol succinate (TOPROL-XL) 25 MG 24 hr tablet Take 1 tablet (25 mg total) by mouth daily. 06/07/17  Yes Crecencio Mc, MD  pantoprazole (PROTONIX) 40 MG tablet TAKE 1 TABLET BY MOUTH  DAILY 04/11/17  Yes Crecencio Mc, MD  sertraline (ZOLOFT) 100 MG tablet TAKE 1 TABLET BY MOUTH  EVERY DAY 02/21/17  Yes Crecencio Mc, MD  simvastatin (ZOCOR) 40 MG tablet TAKE 1 TABLET BY MOUTH AT  BEDTIME 05/11/17  Yes Crecencio Mc, MD   Allergies  Allergen Reactions  . Biaxin [Clarithromycin]   . Corn-Containing Products   . Dust Mite Extract     Almonds,Mold Spores  . Gluten Meal   . Oat   . Shrimp [Shellfish Allergy]     FAMILY HISTORY:  family history includes Cancer in her brother; Early death in her mother; Heart attack in her father; Heart disease in her brother, father, and sister. SOCIAL HISTORY:  reports that she has never smoked. She has never used smokeless tobacco. She reports that she does not drink alcohol or use drugs.  REVIEW OF SYSTEMS:   Constitutional: Negative for fever, chills,  weight loss, malaise/fatigue and diaphoresis.  HENT: Negative for hearing loss, ear pain, nosebleeds, congestion, sore throat, neck pain, tinnitus and ear discharge.   Eyes: Negative for blurred vision, double vision, photophobia, pain, discharge and redness.  Respiratory: Negative for cough, -hemoptysis, -sputum production, +shortness of breath, +wheezing and stridor.   Cardiovascular: Negative for chest pain, palpitations, orthopnea, claudication, leg swelling and PND.  Gastrointestinal: Negative for heartburn, nausea, vomiting,  abdominal pain, diarrhea, constipation, blood in stool and melena.  Genitourinary: Negative for dysuria, urgency, frequency, hematuria and flank pain.  Musculoskeletal: Negative for myalgias, back pain, joint pain and falls.  Skin: Negative for itching and rash.  Neurological: Negative for dizziness, tingling, tremors, sensory change, speech change, focal weakness, seizures, loss of consciousness, weakness and headaches.  Endo/Heme/Allergies: Negative for environmental allergies and polydipsia. Does not bruise/bleed easily.  ALL OTHER ROS ARE NEGATIVE   BP (!) 154/88 (BP Location: Left Arm, Cuff Size: Normal)   Pulse 78   Ht 5\' 4"  (1.626 m)   Wt 189 lb (85.7 kg)   SpO2 95%   BMI 32.44 kg/m   Physical Examination:   GENERAL:NAD, no fevers, chills, no weakness no fatigue HEAD: Normocephalic, atraumatic.  EYES: Pupils equal, round, reactive to light. Extraocular muscles intact. No scleral icterus.  MOUTH: Moist mucosal membrane.   EAR, NOSE, THROAT: Clear without exudates. No external lesions.  NECK: Supple. No thyromegaly. No nodules. No JVD.  PULMONARY:CTA B/L no wheezes, no crackles, no rhonchi CARDIOVASCULAR: S1 and S2. Regular rate and rhythm. No murmurs, rubs, or gallops. No edema.  GASTROINTESTINAL: Soft, nontender, nondistended. No masses. Positive bowel sounds.  MUSCULOSKELETAL: No swelling, clubbing, or edema. Range of motion full in all extremities.  NEUROLOGIC: Cranial nerves II through XII are intact. No gross focal neurological deficits.  SKIN: No ulceration, lesions, rashes, or cyanosis. Skin warm and dry. Turgor intact.  PSYCHIATRIC: Mood, affect within normal limits. The patient is awake, alert and oriented x 3. Insight, judgment intact.      ASSESSMENT / PLAN: 82 yo pleseant white female seen today for SOB and Wheezing with significant history of second hand smoke exposure and environmental exposure to upholstery with signs and symptoms of reactive airways disease  with probable underlying COPD  1.Recommend STARTING ANORO 2.continue albuterol as needed 3.avoid allergens and second hand smoke 4.patient will need PFT's at next OV   Patient satisfied with Plan of action and management. All questions answered Follow up in 3 months   Vernella Niznik Patricia Pesa, M.D.  [facility]  Medical Director [place] Director [facility] Cardio-Pulmonary Department

## 2017-06-13 NOTE — Patient Instructions (Addendum)
ALBUTEROL AS NEEDED START ANORO CONTINUE PROTONIX  PFT's at next OV

## 2017-06-14 DIAGNOSIS — L920 Granuloma annulare: Secondary | ICD-10-CM | POA: Diagnosis not present

## 2017-06-14 DIAGNOSIS — L718 Other rosacea: Secondary | ICD-10-CM | POA: Diagnosis not present

## 2017-07-25 ENCOUNTER — Telehealth: Payer: Self-pay

## 2017-07-25 NOTE — Telephone Encounter (Signed)
Copied from [place] [id number]. Topic: General - Other >> Jul 25, 2017  3:19 PM Carolyn Stare wrote:  Pt is requesting a call back about her meds she said she was taken off of the losartan hydrochlorothiazide and just put on Losartan and a fluid pill. Please call pt and discuss  [phone]

## 2017-07-27 NOTE — Telephone Encounter (Signed)
LMTCB. Please transfer pt to our office.  

## 2017-07-28 ENCOUNTER — Telehealth: Payer: Self-pay

## 2017-07-28 NOTE — Telephone Encounter (Signed)
Copied from [place] [id number]. Topic: General - Other >> Jul 25, 2017  3:19 PM Carolyn Stare wrote:  Pt is requesting a call back about her meds she said she was taken off of the losartan hydrochlorothiazide and just put on Losartan and a fluid pill. Please call pt and discuss  [phone]   >> Jul 28, 2017 11:56 AM Valla Leaver wrote: Patient retuning Sonnie Alamo call. Transferred to office as mentioned in phone note but Janett Billow unavailble per "flow".

## 2017-07-28 NOTE — Telephone Encounter (Signed)
Spoke with pt and she stated that she figured out what had happened. Pt stated that the mail order pharmacy sent her a refill of the losartan and the hctz separate but then sent her another prescription for the combinations pill. Pt stated that she is currently taking the combo pill losartan/hctz which is advised per last office note. While on the phone with pt she stated that she can not remember if she has mentioned this to Dr. Derrel Nip or not but she has been having lower right side abdominal pain that comes and goes. She stated that this has been happening for a while now but that it has been more frequent over the last three days. The pt stated that she will be just be doing whatever and all of a sudden she will get a sharp pain in her right side that causes her to pause for a second. She stated that this only last for a few seconds but might happen again in an hour or so. Pt stated that she had her right ovary removed and her appendix removed many years ago. Pt is wondering if the pain could be from scar tissue.

## 2017-07-28 NOTE — Telephone Encounter (Signed)
See previous message

## 2017-07-28 NOTE — Telephone Encounter (Signed)
It's very difficult for me to give her an answer about that without seeing her.  It could be a kidney stone ,  Would she like to be seen tomorrow? I will work her in at 1:00

## 2017-07-29 ENCOUNTER — Ambulatory Visit (INDEPENDENT_AMBULATORY_CARE_PROVIDER_SITE_OTHER): Payer: Medicare Other | Admitting: Internal Medicine

## 2017-07-29 ENCOUNTER — Ambulatory Visit (INDEPENDENT_AMBULATORY_CARE_PROVIDER_SITE_OTHER): Payer: Medicare Other

## 2017-07-29 ENCOUNTER — Encounter: Payer: Self-pay | Admitting: Internal Medicine

## 2017-07-29 VITALS — BP 136/84 | HR 67 | Temp 97.9°F | Resp 15

## 2017-07-29 DIAGNOSIS — K5901 Slow transit constipation: Secondary | ICD-10-CM | POA: Diagnosis not present

## 2017-07-29 DIAGNOSIS — R109 Unspecified abdominal pain: Secondary | ICD-10-CM | POA: Diagnosis not present

## 2017-07-29 DIAGNOSIS — Z8719 Personal history of other diseases of the digestive system: Secondary | ICD-10-CM | POA: Diagnosis not present

## 2017-07-29 DIAGNOSIS — R1031 Right lower quadrant pain: Secondary | ICD-10-CM | POA: Diagnosis not present

## 2017-07-29 LAB — POCT URINALYSIS DIPSTICK
Bilirubin, UA: NEGATIVE
Glucose, UA: NEGATIVE
Ketones, UA: NEGATIVE
LEUKOCYTES UA: NEGATIVE
NITRITE UA: NEGATIVE
PH UA: 6 (ref 5.0–8.0)
PROTEIN UA: NEGATIVE
RBC UA: NEGATIVE
Spec Grav, UA: 1.02 (ref 1.010–1.025)
UROBILINOGEN UA: 0.2 U/dL

## 2017-07-29 LAB — URINALYSIS, MICROSCOPIC ONLY: RBC / HPF: NONE SEEN (ref 0–?)

## 2017-07-29 NOTE — Patient Instructions (Signed)
Your intermittent pain may be due to chronic constipation  If your x rays confirm this,  I will call in a liquid cathartic to get you "cleaned out."  Following the "cleanout," I want you to start taking Miralax in water every night  If this doesn't help , we will obtain a CT scan of your abdomen

## 2017-07-29 NOTE — Telephone Encounter (Signed)
FYI

## 2017-07-29 NOTE — Progress Notes (Signed)
Subjective:  Patient ID: Jamie Anderson, female    DOB: 01/06/1936  Age: 82 y.o. MRN: [id number]  CC: The primary encounter diagnosis was Flank pain. Diagnoses of Colicky RLQ abdominal pain, Intermittent right lower quadrant abdominal pain, and Constipation, slow transit were also pertinent to this visit.  HPI Jamie Anderson presents for evaluation of unilateral right lower quadrant abdominal pain that has been present intermittently for weeks, but  for the past 3 days has been more recurrent .  The pain is brief and feels like a "catch. " It is  not occurring daily . Has not occurred today .  Jamie Anderson bowel movements have been occurring  Irregularly. Yesterday's stool was hard  And small caliber,  But then she had a normal caliber stool last night.    Jamie Anderson surgical history (GI/GYN history ) is positive for an appendectomy and a right oophorectomy   Outpatient Medications Prior to Visit  Medication Sig Dispense Refill  . albuterol (PROVENTIL HFA;VENTOLIN HFA) 108 (90 Base) MCG/ACT inhaler Inhale 1 puff into the lungs every 6 (six) hours as needed for wheezing or shortness of breath.    . allopurinol (ZYLOPRIM) 100 MG tablet TAKE 1 TABLET BY MOUTH  DAILY 90 tablet 1  . ANORO ELLIPTA 62.5-25 MCG/INH AEPB Inhale 1 puff into the lungs daily. 1 each 6  . Coenzyme Q10 (CO Q 10 PO) Take by mouth daily.    Marland Kitchen conjugated estrogens (PREMARIN) vaginal cream Place 1 Applicatorful vaginally 2 (two) times a week. 42.5 g 12  . ezetimibe (ZETIA) 10 MG tablet Take 1 tablet (10 mg total) by mouth daily. 90 tablet 4  . fluticasone (FLONASE) 50 MCG/ACT nasal spray Place 2 sprays into both nostrils daily. 48 g 3  . HYDROcodone-acetaminophen (NORCO/VICODIN) 5-325 MG tablet Take 1 tablet every 6 (six) hours as needed by mouth. 10 tablet 0  . losartan-hydrochlorothiazide (HYZAAR) 100-12.5 MG tablet Take 1 tablet by mouth daily. 90 tablet 3  . meloxicam (MOBIC) 15 MG tablet TAKE 1 TABLET BY MOUTH  DAILY 90 tablet 1  .  metoprolol succinate (TOPROL-XL) 25 MG 24 hr tablet Take 1 tablet (25 mg total) by mouth daily. 90 tablet 1  . pantoprazole (PROTONIX) 40 MG tablet TAKE 1 TABLET BY MOUTH  DAILY 90 tablet 1  . sertraline (ZOLOFT) 100 MG tablet TAKE 1 TABLET BY MOUTH  EVERY DAY 90 tablet 1  . simvastatin (ZOCOR) 40 MG tablet TAKE 1 TABLET BY MOUTH AT  BEDTIME 90 tablet 1   No facility-administered medications prior to visit.     Review of Systems;  Patient denies headache, fevers, malaise, unintentional weight loss, skin rash, eye pain, sinus congestion and sinus pain, sore throat, dysphagia,  hemoptysis , cough, dyspnea, wheezing, chest pain, palpitations, orthopnea, edema, abdominal pain, nausea, melena, diarrhea,  dysuria, hematuria, urinary  Frequency, nocturia, numbness, tingling, seizures,  Focal weakness, Loss of consciousness,  Tremor, insomnia, depression, anxiety, and suicidal ideation.      Objective:  BP 136/84 (BP Location: Left Arm, Patient Position: Sitting, Cuff Size: Large)   Pulse 67   Temp 97.9 F (36.6 C) (Oral)   Resp 15   SpO2 98%   BP Readings from Last 3 Encounters:  07/29/17 136/84  06/13/17 (!) 154/88  06/06/17 138/72    Wt Readings from Last 3 Encounters:  06/13/17 189 lb (85.7 kg)  06/06/17 190 lb 6.4 oz (86.4 kg)  05/31/17 193 lb (87.5 kg)    General appearance: alert, cooperative  and appears stated age Ears: normal TM's and external ear canals both ears Throat: lips, mucosa, and tongue normal; teeth and gums normal Neck: no adenopathy, no carotid bruit, supple, symmetrical, trachea midline and thyroid not enlarged, symmetric, no tenderness/mass/nodules Back: symmetric, no curvature. ROM normal. No CVA tenderness. Lungs: clear to auscultation bilaterally Heart: regular rate and rhythm, S1, S2 normal, no murmur, click, rub or gallop Abdomen: soft, non-tender; protuberant,  early pannus,  No evidence of ventral or inguinal hernia;  bowel sounds normal; no masses,  no  organomegaly Pulses: 2+ and symmetric Skin: Skin color, texture, turgor normal. No rashes or lesions Lymph nodes: Cervical, supraclavicular, and axillary nodes normal.  Lab Results  Component Value Date   HGBA1C 5.4 01/15/2015    Lab Results  Component Value Date   CREATININE 0.94 06/06/2017   CREATININE 0.97 01/21/2017   CREATININE 0.85 01/18/2017    Lab Results  Component Value Date   WBC 6.0 01/18/2017   HGB 12.6 01/18/2017   HCT 37.5 01/18/2017   PLT 188 01/18/2017   GLUCOSE 80 06/06/2017   CHOL 160 06/06/2017   TRIG 82.0 06/06/2017   HDL 85.60 06/06/2017   LDLDIRECT 171.0 04/20/2016   LDLCALC 58 06/06/2017   ALT 17 06/06/2017   AST 21 06/06/2017   NA 139 06/06/2017   K 3.9 06/06/2017   CL 105 06/06/2017   CREATININE 0.94 06/06/2017   BUN 23 06/06/2017   CO2 28 06/06/2017   TSH 1.72 06/06/2017   INR 0.9 05/29/2012   HGBA1C 5.4 01/15/2015   MICROALBUR <0.7 04/20/2016    Dg Chest 2 View  Result Date: 01/18/2017 CLINICAL DATA:  Chest pain EXAM: CHEST  2 VIEW COMPARISON:  04/10/2015 FINDINGS: Mild cardiomegaly with central congestion. No consolidation or effusion. Increased bronchitic changes. No pneumothorax. IMPRESSION: 1. Mild diffuse increased bronchitic changes compared to prior 2. No focal pulmonary infiltrate 3. Mild cardiomegaly with slight central vascular congestion Electronically Signed   By: Donavan Foil M.D.   On: 01/18/2017 17:09   Ct Angio Chest Pe W And/or Wo Contrast  Result Date: 01/18/2017 CLINICAL DATA:  Chest pain. EXAM: CT ANGIOGRAPHY CHEST WITH CONTRAST TECHNIQUE: Multidetector CT imaging of the chest was performed using the standard protocol during bolus administration of intravenous contrast. Multiplanar CT image reconstructions and MIPs were obtained to evaluate the vascular anatomy. CONTRAST:  61mL ISOVUE-370 IOPAMIDOL (ISOVUE-370) INJECTION 76% COMPARISON:  None. FINDINGS: Cardiovascular: Satisfactory opacification of the pulmonary  arteries to the segmental level. No evidence of pulmonary embolism. Normal heart size. No pericardial effusion. Aortic atherosclerosis noted. Mediastinum/Nodes: The trachea appears patent and is midline. Normal appearance of the esophagus. No mediastinal adenopathy. 9 mm left hilar lymph node identified. 9 mm right hilar lymph node also noted. Lungs/Pleura: No pleural effusion identified. No airspace consolidation or atelectasis. Biapical pleural and parenchymal scarring identified. Upper Abdomen: Unchanged right adrenal nodule from 02/06/2014 compatible with a benign adenoma. Musculoskeletal: No chest wall abnormality. No acute or significant osseous findings. Review of the MIP images confirms the above findings. IMPRESSION: 1. No acute cardiopulmonary abnormalities. No evidence for acute pulmonary embolus. 2.  Aortic Atherosclerosis (ICD10-I70.0). Electronically Signed   By: Kerby Moors M.D.   On: 01/18/2017 20:27    Assessment & Plan:   Problem List Items Addressed This Visit    Intermittent right lower quadrant abdominal pain    Etiology not concernign for appendicitis or ovarian torsion given Jamie Anderson prior surgeries.  UA was normal .   IBS/constipationpredominant  suspected.  Plain films were done today and support this.  Will call in a cathartic laxative to take , followed by use of bulk forming laxataive and/or stool softener daily       Relevant Medications   Lactulose 20 GM/30ML SOLN    Other Visit Diagnoses    Flank pain    -  Primary   Relevant Orders   POCT Urinalysis Dipstick (Completed)   Urine Culture (Completed)   Urine Microscopic Only (Completed)   Colicky RLQ abdominal pain       Relevant Medications   Lactulose 20 GM/30ML SOLN   Other Relevant Orders   DG Abd 1 View (Completed)   Constipation, slow transit       Relevant Medications   Lactulose 20 GM/30ML SOLN      I am having Jamie Anderson start on Lactulose. I am also having Jamie Anderson maintain Jamie Anderson conjugated estrogens,  fluticasone, allopurinol, HYDROcodone-acetaminophen, sertraline, meloxicam, Coenzyme Q10 (CO Q 10 PO), albuterol, ezetimibe, pantoprazole, simvastatin, losartan-hydrochlorothiazide, metoprolol succinate, and ANORO ELLIPTA.  Meds ordered this encounter  Medications  . Lactulose 20 GM/30ML SOLN    Sig: 30 ml every 4 hours until constipation is relieved    Dispense:  236 mL    Refill:  3    There are no discontinued medications.  Follow-up: No follow-ups on file.   Crecencio Mc, MD

## 2017-07-29 NOTE — Telephone Encounter (Signed)
Jamie Anderson,  Patient called back and I offered her to come in at 1pm per Dr Derrel Nip. She declined. She said she will call back later if she thinks she needs to come in.

## 2017-07-29 NOTE — Telephone Encounter (Signed)
Called patient and offered her the 11:30 slot and patient accepted.

## 2017-07-29 NOTE — Telephone Encounter (Signed)
Left  Message for patient to return call to office , [facility] may schedule and advise patient.

## 2017-07-30 LAB — URINE CULTURE
MICRO NUMBER: 90633259
SPECIMEN QUALITY: ADEQUATE

## 2017-07-31 DIAGNOSIS — R1031 Right lower quadrant pain: Secondary | ICD-10-CM | POA: Insufficient documentation

## 2017-07-31 MED ORDER — LACTULOSE 20 GM/30ML PO SOLN: ORAL | 3 refills | Status: DC

## 2017-07-31 NOTE — Assessment & Plan Note (Addendum)
Etiology not concernign for appendicitis or ovarian torsion given her prior surgeries.  UA was normal .   IBS/constipationpredominant  suspected. Plain films were done today and support this.  Will call in a cathartic laxative to take , followed by use of bulk forming laxataive and/or stool softener daily

## 2017-08-19 ENCOUNTER — Other Ambulatory Visit: Payer: Self-pay | Admitting: Internal Medicine

## 2017-09-15 ENCOUNTER — Ambulatory Visit: Payer: Medicare Other | Attending: Internal Medicine

## 2017-09-15 DIAGNOSIS — J4520 Mild intermittent asthma, uncomplicated: Secondary | ICD-10-CM | POA: Diagnosis not present

## 2017-09-15 MED ORDER — ALBUTEROL SULFATE (2.5 MG/3ML) 0.083% IN NEBU
2.5000 mg | INHALATION_SOLUTION | Freq: Once | RESPIRATORY_TRACT | Status: AC
  Administered 2017-09-15: 2.5 mg via RESPIRATORY_TRACT
  Filled 2017-09-15: qty 3

## 2017-09-19 ENCOUNTER — Other Ambulatory Visit: Payer: Self-pay | Admitting: Internal Medicine

## 2017-09-19 DIAGNOSIS — M25561 Pain in right knee: Secondary | ICD-10-CM

## 2017-09-22 ENCOUNTER — Encounter: Payer: Self-pay | Admitting: Internal Medicine

## 2017-09-22 ENCOUNTER — Ambulatory Visit: Payer: Medicare Other | Admitting: Internal Medicine

## 2017-09-22 VITALS — BP 130/67 | HR 72 | Ht 64.0 in | Wt 193.0 lb

## 2017-09-22 DIAGNOSIS — J4520 Mild intermittent asthma, uncomplicated: Secondary | ICD-10-CM

## 2017-09-22 NOTE — Progress Notes (Signed)
Name: Jamie Anderson MRN: [id number] DOB: September 17, 1935     CONSULTATION DATE: 4.8.19 REFERRING MD : Derrel Nip  CHIEF COMPLAINT: SOB and wheezing  STUDIES:    11.18.18 CT chest Independently reviewed by Me No acute process No pneumonia No masses seen   HISTORY OF PRESENT ILLNESS: No SOB  No wheezing Last dose ANORO was 1 week ago No acute issues No signs of infection No signs of CHF    PAST MEDICAL HISTORY :   has a past medical history of Granuloma annulare, Hyperlipidemia, Hypertension, and Menopause.  has a past surgical history that includes Spine surgery (2010); Appendectomy; Venous ablation; Dilation and curettage of uterus; and Right oophorectomy (Right, feb 2014). Prior to Admission medications   Medication Sig Start Date End Date Taking? Authorizing Provider  albuterol (PROVENTIL HFA;VENTOLIN HFA) 108 (90 Base) MCG/ACT inhaler Inhale 1 puff into the lungs every 6 (six) hours as needed for wheezing or shortness of breath.   Yes [provider]  allopurinol (ZYLOPRIM) 100 MG tablet TAKE 1 TABLET BY MOUTH  DAILY 12/27/16  Yes Crecencio Mc, MD  Coenzyme Q10 (CO Q 10 PO) Take by mouth daily.   Yes [provider]  conjugated estrogens (PREMARIN) vaginal cream Place 1 Applicatorful vaginally 2 (two) times a week. 01/19/16  Yes Crecencio Mc, MD  ezetimibe (ZETIA) 10 MG tablet Take 1 tablet (10 mg total) by mouth daily. 03/22/17  Yes Gollan, Kathlene November, MD  fluticasone (FLONASE) 50 MCG/ACT nasal spray Place 2 sprays into both nostrils daily. 06/25/16  Yes Crecencio Mc, MD  HYDROcodone-acetaminophen (NORCO/VICODIN) 5-325 MG tablet Take 1 tablet every 6 (six) hours as needed by mouth. 01/18/17  Yes Harvest Dark, MD  losartan-hydrochlorothiazide (HYZAAR) 100-12.5 MG tablet Take 1 tablet by mouth daily. 06/06/17  Yes Crecencio Mc, MD  meloxicam (MOBIC) 15 MG tablet TAKE 1 TABLET BY MOUTH  DAILY 03/02/17  Yes Crecencio Mc, MD  metoprolol succinate  (TOPROL-XL) 25 MG 24 hr tablet Take 1 tablet (25 mg total) by mouth daily. 06/07/17  Yes Crecencio Mc, MD  pantoprazole (PROTONIX) 40 MG tablet TAKE 1 TABLET BY MOUTH  DAILY 04/11/17  Yes Crecencio Mc, MD  sertraline (ZOLOFT) 100 MG tablet TAKE 1 TABLET BY MOUTH  EVERY DAY 02/21/17  Yes Crecencio Mc, MD  simvastatin (ZOCOR) 40 MG tablet TAKE 1 TABLET BY MOUTH AT  BEDTIME 05/11/17  Yes Crecencio Mc, MD   Allergies  Allergen Reactions  . Biaxin [Clarithromycin]   . Corn-Containing Products   . Dust Mite Extract     Almonds,Mold Spores  . Gluten Meal   . Oat   . Shrimp [Shellfish Allergy]     FAMILY HISTORY:  family history includes Cancer in her brother; Early death in her mother; Heart attack in her father; Heart disease in her brother, father, and sister. SOCIAL HISTORY:  reports that she has never smoked. She has never used smokeless tobacco. She reports that she does not drink alcohol or use drugs.  REVIEW OF SYSTEMS:   Constitutional: Negative for fever, chills, weight loss, malaise/fatigue and diaphoresis.  HENT: Negative for hearing loss, ear pain, nosebleeds, congestion, sore throat, neck pain, tinnitus and ear discharge.   Eyes: Negative for blurred vision, double vision, photophobia, pain, discharge and redness.  Respiratory: Negative for cough, -hemoptysis, -sputum production, +shortness of breath, +wheezing and stridor.   Cardiovascular: Negative for chest pain, palpitations, orthopnea, claudication, leg swelling and PND.  ALL OTHER  ROS ARE NEGATIVE   BP 130/67 (BP Location: Left Arm, Cuff Size: Normal)   Pulse 72   Ht 5\' 4"  (1.626 m)   Wt 193 lb (87.5 kg)   SpO2 97%   BMI 33.13 kg/m   Physical Examination:   GENERAL:NAD, no fevers, chills, no weakness no fatigue HEAD: Normocephalic, atraumatic.  EYES: Pupils equal, round, reactive to light. Extraocular muscles intact. No scleral icterus.  MOUTH: Moist mucosal membrane.   EAR, NOSE, THROAT: Clear without  exudates. No external lesions.  NECK: Supple. No thyromegaly. No nodules. No JVD.  PULMONARY:CTA B/L no wheezes, no crackles, no rhonchi CARDIOVASCULAR: S1 and S2. Regular rate and rhythm. No murmurs, rubs, or gallops. No edema.  PSYCHIATRIC: Mood, affect within normal limits. The patient is awake, alert and oriented x 3. Insight, judgment intact.      ASSESSMENT / PLAN: 82 yo pleseant white female seen today for follow up  SOB and Wheezing with significant history of second hand smoke exposure and environmental exposure to upholstery with signs and symptoms of reactive airways disease, PFT's shows no evidence of Obstruction  1.hold Anoro 2.continue albuterol as needed 3.avoid allergens and second hand smoke   Patient satisfied with Plan of action and management. All questions answered Follow up in 6 months   Jamie Anderson Patricia Pesa, M.D.  [facility]  Medical Director [place] Director [facility] Cardio-Pulmonary Department

## 2017-09-22 NOTE — Patient Instructions (Signed)
Albuterol as needed Hold [facility]

## 2017-10-18 DIAGNOSIS — L570 Actinic keratosis: Secondary | ICD-10-CM | POA: Diagnosis not present

## 2017-10-18 DIAGNOSIS — L920 Granuloma annulare: Secondary | ICD-10-CM | POA: Diagnosis not present

## 2017-10-18 DIAGNOSIS — L578 Other skin changes due to chronic exposure to nonionizing radiation: Secondary | ICD-10-CM | POA: Diagnosis not present

## 2017-10-18 DIAGNOSIS — L820 Inflamed seborrheic keratosis: Secondary | ICD-10-CM | POA: Diagnosis not present

## 2017-11-29 ENCOUNTER — Other Ambulatory Visit: Payer: Self-pay | Admitting: Internal Medicine

## 2018-01-05 ENCOUNTER — Other Ambulatory Visit: Payer: Self-pay

## 2018-01-05 NOTE — Patient Outreach (Signed)
[facility]) Care Management  01/05/2018  Jamie Anderson 08-12-1935 [id number]   Medication Adherence call to Jamie Anderson spoke with patient she is still taking both medications Losartan/ HCTZ 100/12.5 mg and Simvastatin 40 mg patient ask if we can call [facility] an order both medication, [facility] will mail with in 7-10 business days. Jamie Anderson is showing past due under [place].   [place] Management Direct Dial [phone]  Fax [phone] [email or web address]

## 2018-01-17 ENCOUNTER — Other Ambulatory Visit: Payer: Self-pay | Admitting: Internal Medicine

## 2018-01-17 DIAGNOSIS — C44519 Basal cell carcinoma of skin of other part of trunk: Secondary | ICD-10-CM | POA: Diagnosis not present

## 2018-01-17 DIAGNOSIS — L820 Inflamed seborrheic keratosis: Secondary | ICD-10-CM | POA: Diagnosis not present

## 2018-01-17 DIAGNOSIS — L920 Granuloma annulare: Secondary | ICD-10-CM | POA: Diagnosis not present

## 2018-01-17 DIAGNOSIS — L853 Xerosis cutis: Secondary | ICD-10-CM | POA: Diagnosis not present

## 2018-01-17 DIAGNOSIS — D485 Neoplasm of uncertain behavior of skin: Secondary | ICD-10-CM | POA: Diagnosis not present

## 2018-01-17 DIAGNOSIS — C4491 Basal cell carcinoma of skin, unspecified: Secondary | ICD-10-CM

## 2018-01-17 DIAGNOSIS — L570 Actinic keratosis: Secondary | ICD-10-CM | POA: Diagnosis not present

## 2018-01-17 HISTORY — DX: Basal cell carcinoma of skin, unspecified: C44.91

## 2018-01-23 ENCOUNTER — Telehealth: Payer: Self-pay | Admitting: Internal Medicine

## 2018-01-23 NOTE — Telephone Encounter (Signed)
Copied from [place] [phone]. Topic: Quick Communication - Rx Refill/Question >> Jan 23, 2018 10:31 AM Selinda Flavin B, NT wrote: **Patient calling and states that [facility] was out of this medication and that she would like this medication sent to [facility]. States that the medication can be split into 2 medications if needed. States that she does not have a pill for today. Please advise**  Medication: losartan-hydrochlorothiazide (HYZAAR) 100-12.5 MG tablet  Has the patient contacted their pharmacy? Yes.   (Agent: If no, request that the patient contact the pharmacy for the refill.) (Agent: If yes, when and what did the pharmacy advise?)  Preferred Pharmacy (with phone number or street name): [place], [place]  Agent: Please be advised that RX refills may take up to 3 business days. We ask that you follow-up with your pharmacy.

## 2018-01-24 ENCOUNTER — Ambulatory Visit: Payer: Self-pay

## 2018-01-24 MED ORDER — HYDROCHLOROTHIAZIDE 12.5 MG PO CAPS: 12.5000 mg | ORAL_CAPSULE | Freq: Every day | ORAL | 0 refills | Status: DC

## 2018-01-24 MED ORDER — LOSARTAN POTASSIUM 100 MG PO TABS: 100.0000 mg | ORAL_TABLET | Freq: Every day | ORAL | 0 refills | Status: DC

## 2018-01-24 NOTE — Telephone Encounter (Signed)
This was taken care of in the telephone encounter on 01/23/18, no triage needed. See encounter for details.

## 2018-01-24 NOTE — Telephone Encounter (Signed)
Please advise 

## 2018-01-24 NOTE — Telephone Encounter (Signed)
Patient called and says she is completely out of her BP medication Hyzaar, she didn't have a pill for yesterday. She says [facility] doesn't have it in stock, so she called [place] and was told to have Dr. Derrel Nip to send in a prescription for 2 pills, because the 1 pill is on back order. I called yesterday and thought it would be taken care of. I advised I will send this high priority and call the office to alert them of the request. I called the office and spoke to Judson Roch, [facility].   Losartan-HCTZ 100-12.5   Pharmacy: [place], [place] [phone] (Phone) [phone] (Fax)

## 2018-01-24 NOTE — Telephone Encounter (Signed)
Patient notified that prescription was sent. 

## 2018-01-24 NOTE — Telephone Encounter (Signed)
Patient calling back to check status of this refill stating she is completely out of this medication. Please advise.

## 2018-01-24 NOTE — Telephone Encounter (Signed)
Losartan and hctz sent separately to [facility], 90 day supply.

## 2018-01-30 ENCOUNTER — Ambulatory Visit (INDEPENDENT_AMBULATORY_CARE_PROVIDER_SITE_OTHER): Payer: Medicare Other

## 2018-01-30 ENCOUNTER — Ambulatory Visit (INDEPENDENT_AMBULATORY_CARE_PROVIDER_SITE_OTHER): Payer: Medicare Other | Admitting: Internal Medicine

## 2018-01-30 ENCOUNTER — Encounter: Payer: Self-pay | Admitting: Internal Medicine

## 2018-01-30 VITALS — BP 146/74 | HR 74 | Temp 97.8°F | Resp 15 | Ht 64.0 in | Wt 194.8 lb

## 2018-01-30 DIAGNOSIS — M47816 Spondylosis without myelopathy or radiculopathy, lumbar region: Secondary | ICD-10-CM | POA: Diagnosis not present

## 2018-01-30 DIAGNOSIS — M545 Low back pain: Secondary | ICD-10-CM | POA: Diagnosis not present

## 2018-01-30 DIAGNOSIS — M5136 Other intervertebral disc degeneration, lumbar region: Secondary | ICD-10-CM | POA: Diagnosis not present

## 2018-01-30 DIAGNOSIS — G8929 Other chronic pain: Secondary | ICD-10-CM

## 2018-01-30 DIAGNOSIS — Z0000 Encounter for general adult medical examination without abnormal findings: Secondary | ICD-10-CM | POA: Diagnosis not present

## 2018-01-30 DIAGNOSIS — Z6832 Body mass index (BMI) 32.0-32.9, adult: Secondary | ICD-10-CM | POA: Diagnosis not present

## 2018-01-30 DIAGNOSIS — E785 Hyperlipidemia, unspecified: Secondary | ICD-10-CM

## 2018-01-30 DIAGNOSIS — I10 Essential (primary) hypertension: Secondary | ICD-10-CM | POA: Diagnosis not present

## 2018-01-30 DIAGNOSIS — E6609 Other obesity due to excess calories: Secondary | ICD-10-CM | POA: Diagnosis not present

## 2018-01-30 DIAGNOSIS — I700 Atherosclerosis of aorta: Secondary | ICD-10-CM

## 2018-01-30 DIAGNOSIS — Z1239 Encounter for other screening for malignant neoplasm of breast: Secondary | ICD-10-CM | POA: Diagnosis not present

## 2018-01-30 DIAGNOSIS — Z23 Encounter for immunization: Secondary | ICD-10-CM | POA: Diagnosis not present

## 2018-01-30 DIAGNOSIS — R590 Localized enlarged lymph nodes: Secondary | ICD-10-CM

## 2018-01-30 NOTE — Assessment & Plan Note (Signed)
Zetia added by DR Rockey Situ in January 2019 for goal LDL < 100.  Repeat panel is due

## 2018-01-30 NOTE — Patient Instructions (Addendum)
Return in a week or so for your final Pneumonia vaccine  You received your flu vaccine today    Your annual mammogram has been ordered.  You are encouraged (required) to call to make your appointment at [facility]   Health Maintenance for Postmenopausal Women Menopause is a normal process in which your reproductive ability comes to an end. This process happens gradually over a span of months to years, usually between the ages of 43 and 95. Menopause is complete when you have missed 12 consecutive menstrual periods. It is important to talk with your health care provider about some of the most common conditions that affect postmenopausal women, such as heart disease, cancer, and bone loss (osteoporosis). Adopting a healthy lifestyle and getting preventive care can help to promote your health and wellness. Those actions can also lower your chances of developing some of these common conditions. What should I know about menopause? During menopause, you may experience a number of symptoms, such as:  Moderate-to-severe hot flashes.  Night sweats.  Decrease in sex drive.  Mood swings.  Headaches.  Tiredness.  Irritability.  Memory problems.  Insomnia.  Choosing to treat or not to treat menopausal changes is an individual decision that you make with your health care provider. What should I know about hormone replacement therapy and supplements? Hormone therapy products are effective for treating symptoms that are associated with menopause, such as hot flashes and night sweats. Hormone replacement carries certain risks, especially as you become older. If you are thinking about using estrogen or estrogen with progestin treatments, discuss the benefits and risks with your health care provider. What should I know about heart disease and stroke? Heart disease, heart attack, and stroke become more likely as you age. This may be due, in part, to the hormonal changes that your body  experiences during menopause. These can affect how your body processes dietary fats, triglycerides, and cholesterol. Heart attack and stroke are both medical emergencies. There are many things that you can do to help prevent heart disease and stroke:  Have your blood pressure checked at least every 1-2 years. High blood pressure causes heart disease and increases the risk of stroke.  If you are 72-20 years old, ask your health care provider if you should take aspirin to prevent a heart attack or a stroke.  Do not use any tobacco products, including cigarettes, chewing tobacco, or electronic cigarettes. If you need help quitting, ask your health care provider.  It is important to eat a healthy diet and maintain a healthy weight. ? Be sure to include plenty of vegetables, fruits, low-fat dairy products, and lean protein. ? Avoid eating foods that are high in solid fats, added sugars, or salt (sodium).  Get regular exercise. This is one of the most important things that you can do for your health. ? Try to exercise for at least 150 minutes each week. The type of exercise that you do should increase your heart rate and make you sweat. This is known as moderate-intensity exercise. ? Try to do strengthening exercises at least twice each week. Do these in addition to the moderate-intensity exercise.  Know your numbers.Ask your health care provider to check your cholesterol and your blood glucose. Continue to have your blood tested as directed by your health care provider.  What should I know about cancer screening? There are several types of cancer. Take the following steps to reduce your risk and to catch any cancer development as early  as possible. Breast Cancer  Practice breast self-awareness. ? This means understanding how your breasts normally appear and feel. ? It also means doing regular breast self-exams. Let your health care provider know about any changes, no matter how small.  If you  are 57 or older, have a clinician do a breast exam (clinical breast exam or CBE) every year. Depending on your age, family history, and medical history, it may be recommended that you also have a yearly breast X-ray (mammogram).  If you have a family history of breast cancer, talk with your health care provider about genetic screening.  If you are at high risk for breast cancer, talk with your health care provider about having an MRI and a mammogram every year.  Breast cancer (BRCA) gene test is recommended for women who have family members with BRCA-related cancers. Results of the assessment will determine the need for genetic counseling and BRCA1 and for BRCA2 testing. BRCA-related cancers include these types: ? Breast. This occurs in males or females. ? Ovarian. ? Tubal. This may also be called fallopian tube cancer. ? Cancer of the abdominal or pelvic lining (peritoneal cancer). ? Prostate. ? Pancreatic.  Cervical, Uterine, and Ovarian Cancer Your health care provider may recommend that you be screened regularly for cancer of the pelvic organs. These include your ovaries, uterus, and vagina. This screening involves a pelvic exam, which includes checking for microscopic changes to the surface of your cervix (Pap test).  For women ages 21-65, health care providers may recommend a pelvic exam and a Pap test every three years. For women ages 73-65, they may recommend the Pap test and pelvic exam, combined with testing for human papilloma virus (HPV), every five years. Some types of HPV increase your risk of cervical cancer. Testing for HPV may also be done on women of any age who have unclear Pap test results.  Other health care providers may not recommend any screening for nonpregnant women who are considered low risk for pelvic cancer and have no symptoms. Ask your health care provider if a screening pelvic exam is right for you.  If you have had past treatment for cervical cancer or a  condition that could lead to cancer, you need Pap tests and screening for cancer for at least 20 years after your treatment. If Pap tests have been discontinued for you, your risk factors (such as having a new sexual partner) need to be reassessed to determine if you should start having screenings again. Some women have medical problems that increase the chance of getting cervical cancer. In these cases, your health care provider may recommend that you have screening and Pap tests more often.  If you have a family history of uterine cancer or ovarian cancer, talk with your health care provider about genetic screening.  If you have vaginal bleeding after reaching menopause, tell your health care provider.  There are currently no reliable tests available to screen for ovarian cancer.  Lung Cancer Lung cancer screening is recommended for adults 84-35 years old who are at high risk for lung cancer because of a history of smoking. A yearly low-dose CT scan of the lungs is recommended if you:  Currently smoke.  Have a history of at least 30 pack-years of smoking and you currently smoke or have quit within the past 15 years. A pack-year is smoking an average of one pack of cigarettes per day for one year.  Yearly screening should:  Continue until it has been 15  years since you quit.  Stop if you develop a health problem that would prevent you from having lung cancer treatment.  Colorectal Cancer  This type of cancer can be detected and can often be prevented.  Routine colorectal cancer screening usually begins at age 45 and continues through age 22.  If you have risk factors for colon cancer, your health care provider may recommend that you be screened at an earlier age.  If you have a family history of colorectal cancer, talk with your health care provider about genetic screening.  Your health care provider may also recommend using home test kits to check for hidden blood in your stool.  A  small camera at the end of a tube can be used to examine your colon directly (sigmoidoscopy or colonoscopy). This is done to check for the earliest forms of colorectal cancer.  Direct examination of the colon should be repeated every 5-10 years until age 58. However, if early forms of precancerous polyps or small growths are found or if you have a family history or genetic risk for colorectal cancer, you may need to be screened more often.  Skin Cancer  Check your skin from head to toe regularly.  Monitor any moles. Be sure to tell your health care provider: ? About any new moles or changes in moles, especially if there is a change in a mole's shape or color. ? If you have a mole that is larger than the size of a pencil eraser.  If any of your family members has a history of skin cancer, especially at a young age, talk with your health care provider about genetic screening.  Always use sunscreen. Apply sunscreen liberally and repeatedly throughout the day.  Whenever you are outside, protect yourself by wearing long sleeves, pants, a wide-brimmed hat, and sunglasses.  What should I know about osteoporosis? Osteoporosis is a condition in which bone destruction happens more quickly than new bone creation. After menopause, you may be at an increased risk for osteoporosis. To help prevent osteoporosis or the bone fractures that can happen because of osteoporosis, the following is recommended:  If you are 16-77 years old, get at least 1,000 mg of calcium and at least 600 mg of vitamin D per day.  If you are older than age 74 but younger than age 57, get at least 1,200 mg of calcium and at least 600 mg of vitamin D per day.  If you are older than age 105, get at least 1,200 mg of calcium and at least 800 mg of vitamin D per day.  Smoking and excessive alcohol intake increase the risk of osteoporosis. Eat foods that are rich in calcium and vitamin D, and do weight-bearing exercises several times  each week as directed by your health care provider. What should I know about how menopause affects my mental health? Depression may occur at any age, but it is more common as you become older. Common symptoms of depression include:  Low or sad mood.  Changes in sleep patterns.  Changes in appetite or eating patterns.  Feeling an overall lack of motivation or enjoyment of activities that you previously enjoyed.  Frequent crying spells.  Talk with your health care provider if you think that you are experiencing depression. What should I know about immunizations? It is important that you get and maintain your immunizations. These include:  Tetanus, diphtheria, and pertussis (Tdap) booster vaccine.  Influenza every year before the flu season begins.  Pneumonia vaccine.  Shingles vaccine.  Your health care provider may also recommend other immunizations. This information is not intended to replace advice given to you by your health care provider. Make sure you discuss any questions you have with your health care provider. Document Released: 04/16/2005 Document Revised: 09/12/2015 Document Reviewed: 11/26/2014 Elsevier Interactive Patient Education  2018 [employer].

## 2018-01-30 NOTE — Progress Notes (Signed)
Patient ID: Jamie Anderson, female    DOB: 15-Jul-1935  Age: 82 y.o. MRN: [id number]  The patient is here for annual preventive examination and management of other chronic and acute problems.   The risk factors are reflected in the social history.  The roster of all physicians providing medical care to patient - is listed in the Snapshot section of the chart.  Activities of daily living:  The patient is 100% independent in all ADLs: dressing, toileting, feeding as well as independent mobility  Home safety : The patient has smoke detectors in the home. They wear seatbelts.  There are no firearms at home. There is no violence in the home.   There is no risks for hepatitis, STDs or HIV. There is no   history of blood transfusion. They have no travel history to infectious disease endemic areas of the world.  The patient has seen their dentist in the last six month. They have seen their eye doctor in the last year. They admit to slight hearing difficulty with regard to whispered voices and some television programs.  They have deferred audiologic testing in the last year.  They do not  have excessive sun exposure. Discussed the need for sun protection: hats, long sleeves and use of sunscreen if there is significant sun exposure.   Diet: the importance of a healthy diet is discussed. They do have a healthy diet.  The benefits of regular aerobic exercise were discussed. She walks 4 times per week ,  20 minutes.   Depression screen: there are no signs or vegative symptoms of depression- irritability, change in appetite, anhedonia, sadness/tearfullness.  Cognitive assessment: the patient manages all their financial and personal affairs and is actively engaged. They could relate day,date,year and events; recalled 2/3 objects at 3 minutes; performed clock-face test normally.  The following portions of the patient's history were reviewed and updated as appropriate: allergies, current medications, past family  history, past medical history,  past surgical history, past social history  and problem list.  Visual acuity was not assessed per patient preference since she has regular follow up with her ophthalmologist. Hearing and body mass index were assessed and reviewed.   During the course of the visit the patient was educated and counseled about appropriate screening and preventive services including : fall prevention , diabetes screening, nutrition counseling, colorectal cancer screening, and recommended immunizations.    CC: The primary encounter diagnosis was Encounter for preventive health examination. Diagnoses of Chronic bilateral low back pain without sciatica, Breast cancer screening, Hyperlipidemia LDL goal <130, Essential hypertension, Class 1 obesity due to excess calories without serious comorbidity with body mass index (BMI) of 32.0 to 32.9 in adult, Hilar adenopathy, Aortic atherosclerosis ([place]), and Encounter for immunization were also pertinent to this visit.    Cc: low back pain,  aggravated by housework.  History of lumbar decompression for sciatica involving the left leg 10 yrs ago .  currenlty reports no pain in either leg . Having nocturnal leg cramps, especially in the right thigh brought on by when rolling over in bed'  Chronic constipation:: Using miralax , an apple  And drinking close to 60 ounces of water daily   Asthma:  Managed by [facility]   HTN:  Patient is taking her medications as prescribed and notes no adverse effects.  Home BP readings have been done about once per week and are  generally < 130/80 .  She is avoiding added salt in her diet and not walking  regularly for exercise  .   History Jamie Anderson has a past medical history of Granuloma annulare, Hyperlipidemia, Hypertension, Menopause, and Plantar fasciitis of right foot (12/24/2014).   She has a past surgical history that includes Spine surgery (2010); Appendectomy; Venous ablation; Dilation and curettage of uterus; and  Right oophorectomy (Right, feb 2014).   Her family history includes Cancer in her brother; Early death in her mother; Heart attack in her father; Heart disease in her brother, father, and sister.She reports that she has never smoked. She has never used smokeless tobacco. She reports that she does not drink alcohol or use drugs.  Outpatient Medications Prior to Visit  Medication Sig Dispense Refill  . albuterol (PROVENTIL HFA;VENTOLIN HFA) 108 (90 Base) MCG/ACT inhaler Inhale 1 puff into the lungs every 6 (six) hours as needed for wheezing or shortness of breath.    . allopurinol (ZYLOPRIM) 100 MG tablet TAKE 1 TABLET BY MOUTH  DAILY 90 tablet 1  . ANORO ELLIPTA 62.5-25 MCG/INH AEPB Inhale 1 puff into the lungs daily. 1 each 6  . Coenzyme Q10 (CO Q 10 PO) Take by mouth daily.    Marland Kitchen conjugated estrogens (PREMARIN) vaginal cream Place 1 Applicatorful vaginally 2 (two) times a week. 42.5 g 12  . ezetimibe (ZETIA) 10 MG tablet Take 1 tablet (10 mg total) by mouth daily. 90 tablet 4  . fluticasone (FLONASE) 50 MCG/ACT nasal spray USE 2 SPRAYS IN EACH  NOSTRIL DAILY 48 g 3  . hydrochlorothiazide (MICROZIDE) 12.5 MG capsule Take 1 capsule (12.5 mg total) by mouth daily. 90 capsule 0  . HYDROcodone-acetaminophen (NORCO/VICODIN) 5-325 MG tablet Take 1 tablet every 6 (six) hours as needed by mouth. 10 tablet 0  . Lactulose 20 GM/30ML SOLN 30 ml every 4 hours until constipation is relieved 236 mL 3  . losartan (COZAAR) 100 MG tablet Take 1 tablet (100 mg total) by mouth daily. 90 tablet 0  . meloxicam (MOBIC) 15 MG tablet TAKE 1 TABLET BY MOUTH  DAILY 90 tablet 1  . metoprolol succinate (TOPROL-XL) 25 MG 24 hr tablet TAKE 1 TABLET BY MOUTH  DAILY 90 tablet 1  . pantoprazole (PROTONIX) 40 MG tablet TAKE 1 TABLET BY MOUTH  DAILY 90 tablet 1  . sertraline (ZOLOFT) 100 MG tablet TAKE 1 TABLET BY MOUTH  EVERY DAY 90 tablet 1  . simvastatin (ZOCOR) 40 MG tablet TAKE 1 TABLET BY MOUTH AT  BEDTIME 90 tablet 1  .  losartan-hydrochlorothiazide (HYZAAR) 100-12.5 MG tablet Take 1 tablet by mouth daily. 90 tablet 3   No facility-administered medications prior to visit.     Review of Systems  Objective:  BP (!) 146/74 (BP Location: Left Arm, Patient Position: Sitting, Cuff Size: Large)   Pulse 74   Temp 97.8 F (36.6 C) (Oral)   Resp 15   Ht 5\' 4"  (1.626 m)   Wt 194 lb 12.8 oz (88.4 kg)   SpO2 97%   BMI 33.44 kg/m   Physical Exam    Assessment & Plan:   Problem List Items Addressed This Visit    Aortic atherosclerosis ([place])    Zetia added by DR Rockey Situ in January 2019 for goal LDL < 100.  Repeat panel is due        Encounter for preventive health examination - Primary    age appropriate education and counseling updated, referrals for preventative services and immunizations addressed, dietary and smoking counseling addressed, most recent labs reviewed.  I have personally reviewed and have  noted:  1) the patient's medical and social history 2) The pt's use of alcohol, tobacco, and illicit drugs 3) The patient's current medications and supplements 4) Functional ability including ADL's, fall risk, home safety risk, hearing and visual impairment 5) Diet and physical activities 6) Evidence for depression or mood disorder 7) The patient's height, weight, and BMI have been recorded in the chart  I have made referrals, and provided counseling and education based on review of the above      Hilar adenopathy    CT chest has been reviewed by Dr Mortimer Fries.  No concerns.        Hyperlipidemia LDL goal <130    LDL and triglycerides  Are at goal on simvastatin  ,  No changes today Lab Results  Component Value Date   CHOL 156 01/30/2018   HDL 75.90 01/30/2018   LDLCALC 68 01/30/2018   LDLDIRECT 59.0 01/30/2018   TRIG 62.0 01/30/2018   CHOLHDL 2 01/30/2018   Lab Results  Component Value Date   ALT 18 01/30/2018   AST 21 01/30/2018   ALKPHOS 106 01/30/2018   BILITOT 0.4 01/30/2018          Relevant Orders   Lipid panel (Completed)   Direct LDL (Completed)   Hypertension    Well controlled on current regimen. Renal function stable, no changes today.  Lab Results  Component Value Date   CREATININE 1.17 01/30/2018   Lab Results  Component Value Date   NA 138 01/30/2018   K 4.3 01/30/2018   CL 104 01/30/2018   CO2 24 01/30/2018         Relevant Orders   Comprehensive metabolic panel (Completed)   Obesity   Relevant Orders   Hemoglobin A1c (Completed)   TSH (Completed)    Other Visit Diagnoses    Chronic bilateral low back pain without sciatica       Relevant Orders   DG Lumbar Spine Complete (Completed)   Breast cancer screening       Relevant Orders   MM DIGITAL SCREENING BILATERAL   Encounter for immunization       Relevant Orders   Flu vaccine HIGH DOSE PF (Completed)      I have discontinued Izora Gala L. Panepinto's losartan-hydrochlorothiazide. I am also having her maintain her conjugated estrogens, HYDROcodone-acetaminophen, Coenzyme Q10 (CO Q 10 PO), albuterol, ezetimibe, ANORO ELLIPTA, Lactulose, pantoprazole, metoprolol succinate, sertraline, meloxicam, simvastatin, fluticasone, allopurinol, losartan, and hydrochlorothiazide.  No orders of the defined types were placed in this encounter.   Medications Discontinued During This Encounter  Medication Reason  . losartan-hydrochlorothiazide (HYZAAR) 100-12.5 MG tablet     Follow-up: No follow-ups on file.   Crecencio Mc, MD

## 2018-01-31 ENCOUNTER — Encounter: Payer: Self-pay | Admitting: Internal Medicine

## 2018-01-31 LAB — LIPID PANEL
CHOLESTEROL: 156 mg/dL (ref 0–200)
HDL: 75.9 mg/dL (ref >39.00)
LDL Cholesterol: 68 mg/dL (ref 0–99)
NonHDL: 80.27
Total CHOL/HDL Ratio: 2
Triglycerides: 62 mg/dL (ref 0.0–149.0)
VLDL: 12.4 mg/dL (ref 0.0–40.0)

## 2018-01-31 LAB — COMPREHENSIVE METABOLIC PANEL
ALK PHOS: 106 U/L (ref 39–117)
ALT: 18 U/L (ref 0–35)
AST: 21 U/L (ref 0–37)
Albumin: 4.3 g/dL (ref 3.5–5.2)
BUN: 24 mg/dL — ABNORMAL HIGH (ref 6–23)
CO2: 24 mEq/L (ref 19–32)
Calcium: 9.8 mg/dL (ref 8.4–10.5)
Chloride: 104 mEq/L (ref 96–112)
Creatinine, Ser: 1.17 mg/dL (ref 0.40–1.20)
GFR: 47.27 mL/min — AB (ref >60.00)
Glucose, Bld: 88 mg/dL (ref 70–99)
POTASSIUM: 4.3 meq/L (ref 3.5–5.1)
Sodium: 138 mEq/L (ref 135–145)
TOTAL PROTEIN: 6.7 g/dL (ref 6.0–8.3)
Total Bilirubin: 0.4 mg/dL (ref 0.2–1.2)

## 2018-01-31 LAB — TSH: TSH: 1.65 u[IU]/mL (ref 0.35–4.50)

## 2018-01-31 LAB — HEMOGLOBIN A1C: HEMOGLOBIN A1C: 5.5 % (ref 4.6–6.5)

## 2018-01-31 LAB — LDL CHOLESTEROL, DIRECT: Direct LDL: 59 mg/dL

## 2018-01-31 NOTE — Assessment & Plan Note (Signed)
CT chest has been reviewed by Dr Mortimer Fries.  No concerns.

## 2018-01-31 NOTE — Assessment & Plan Note (Signed)
LDL and triglycerides  Are at goal on simvastatin  ,  No changes today Lab Results  Component Value Date   CHOL 156 01/30/2018   HDL 75.90 01/30/2018   LDLCALC 68 01/30/2018   LDLDIRECT 59.0 01/30/2018   TRIG 62.0 01/30/2018   CHOLHDL 2 01/30/2018   Lab Results  Component Value Date   ALT 18 01/30/2018   AST 21 01/30/2018   ALKPHOS 106 01/30/2018   BILITOT 0.4 01/30/2018

## 2018-01-31 NOTE — Assessment & Plan Note (Signed)

## 2018-01-31 NOTE — Assessment & Plan Note (Signed)
Well controlled on current regimen. Renal function stable, no changes today.  Lab Results  Component Value Date   CREATININE 1.17 01/30/2018   Lab Results  Component Value Date   NA 138 01/30/2018   K 4.3 01/30/2018   CL 104 01/30/2018   CO2 24 01/30/2018

## 2018-02-06 DIAGNOSIS — C44519 Basal cell carcinoma of skin of other part of trunk: Secondary | ICD-10-CM | POA: Diagnosis not present

## 2018-02-06 DIAGNOSIS — L820 Inflamed seborrheic keratosis: Secondary | ICD-10-CM | POA: Diagnosis not present

## 2018-02-06 DIAGNOSIS — L570 Actinic keratosis: Secondary | ICD-10-CM | POA: Diagnosis not present

## 2018-02-06 DIAGNOSIS — L988 Other specified disorders of the skin and subcutaneous tissue: Secondary | ICD-10-CM | POA: Diagnosis not present

## 2018-02-07 ENCOUNTER — Other Ambulatory Visit: Payer: Self-pay | Admitting: Internal Medicine

## 2018-02-07 DIAGNOSIS — M545 Low back pain, unspecified: Secondary | ICD-10-CM

## 2018-02-07 DIAGNOSIS — G8929 Other chronic pain: Secondary | ICD-10-CM

## 2018-02-07 NOTE — Progress Notes (Signed)
amb ref to pt

## 2018-02-09 ENCOUNTER — Ambulatory Visit (INDEPENDENT_AMBULATORY_CARE_PROVIDER_SITE_OTHER): Payer: Medicare Other

## 2018-02-09 DIAGNOSIS — Z23 Encounter for immunization: Secondary | ICD-10-CM | POA: Diagnosis not present

## 2018-02-09 NOTE — Progress Notes (Signed)
Pt was seen today for a NV for the pneumococcal 23 given IM in the LD. Pt tolerated well.

## 2018-02-13 DIAGNOSIS — C44519 Basal cell carcinoma of skin of other part of trunk: Secondary | ICD-10-CM | POA: Diagnosis not present

## 2018-02-17 ENCOUNTER — Telehealth: Payer: Self-pay

## 2018-02-17 DIAGNOSIS — G8929 Other chronic pain: Secondary | ICD-10-CM

## 2018-02-17 DIAGNOSIS — M545 Low back pain, unspecified: Secondary | ICD-10-CM

## 2018-02-17 NOTE — Telephone Encounter (Signed)
Copied from [place] [id number]. Topic: General - Other >> Feb 17, 2018  3:29 PM Janace Aris A wrote: Reason for CRM: Pt called in, she says she would like to go to [facility], in [place] on [address].

## 2018-02-21 NOTE — Addendum Note (Signed)
Addended by: Crecencio Mc on: 02/21/2018 11:39 AM   Modules accepted: Orders

## 2018-02-25 ENCOUNTER — Telehealth: Payer: Self-pay | Admitting: Internal Medicine

## 2018-03-14 ENCOUNTER — Encounter: Payer: Self-pay | Admitting: Internal Medicine

## 2018-03-14 ENCOUNTER — Encounter: Payer: Medicare Other | Admitting: Internal Medicine

## 2018-03-14 ENCOUNTER — Ambulatory Visit (INDEPENDENT_AMBULATORY_CARE_PROVIDER_SITE_OTHER): Payer: Medicare Other | Admitting: Internal Medicine

## 2018-03-14 VITALS — BP 132/74 | HR 73 | Temp 97.9°F | Resp 15 | Ht 64.0 in | Wt 195.4 lb

## 2018-03-14 DIAGNOSIS — R202 Paresthesia of skin: Secondary | ICD-10-CM

## 2018-03-14 DIAGNOSIS — N183 Chronic kidney disease, stage 3 unspecified: Secondary | ICD-10-CM

## 2018-03-14 DIAGNOSIS — M5442 Lumbago with sciatica, left side: Secondary | ICD-10-CM | POA: Diagnosis not present

## 2018-03-14 DIAGNOSIS — I10 Essential (primary) hypertension: Secondary | ICD-10-CM | POA: Diagnosis not present

## 2018-03-14 DIAGNOSIS — R200 Anesthesia of skin: Secondary | ICD-10-CM

## 2018-03-14 DIAGNOSIS — G8929 Other chronic pain: Secondary | ICD-10-CM

## 2018-03-14 DIAGNOSIS — Z0000 Encounter for general adult medical examination without abnormal findings: Secondary | ICD-10-CM

## 2018-03-14 LAB — BASIC METABOLIC PANEL
BUN: 20 mg/dL (ref 6–23)
CHLORIDE: 104 meq/L (ref 96–112)
CO2: 29 meq/L (ref 19–32)
Calcium: 9.7 mg/dL (ref 8.4–10.5)
Creatinine, Ser: 1.03 mg/dL (ref 0.40–1.20)
GFR: 54.75 mL/min — ABNORMAL LOW (ref >60.00)
GLUCOSE: 89 mg/dL (ref 70–99)
POTASSIUM: 3.9 meq/L (ref 3.5–5.1)
Sodium: 138 mEq/L (ref 135–145)

## 2018-03-14 MED ORDER — TRAMADOL HCL 50 MG PO TABS: 50.0000 mg | ORAL_TABLET | Freq: Three times a day (TID) | ORAL | 0 refills | Status: DC | PRN

## 2018-03-14 NOTE — Progress Notes (Signed)
Patient ID: Jamie Anderson, female    DOB: Nov 18, 1935  Age: 83 y.o. MRN: [id number]  The patient is here for annual preventive  examination and management of other chronic and acute problems.   The risk factors are reflected in the social history.  The roster of all physicians providing medical care to patient - is listed in the Snapshot section of the chart.  Activities of daily living:  The patient is 100% independent in all ADLs: dressing, toileting, feeding as well as independent mobility  Home safety : The patient has smoke detectors in the home. They wear seatbelts.  There are no firearms at home. There is no violence in the home.   There is no risks for hepatitis, STDs or HIV. There is no   history of blood transfusion. They have no travel history to infectious disease endemic areas of the world.  The patient has not seen their dentist in the last six months due to lack of dental insurance and the "cost of teeth cleaning was the same as a full mouth even though she doesn't have many teeth.". They have an annual eye appointment on Tuesday . They have no hearing difficulty with regard to whispered voices and some television programs.  They have deferred audiologic testing in the last year.  They do not  have excessive sun exposure. Discussed the need for sun protection: hats, long sleeves and use of sunscreen if there is significant sun exposure.   Diet: the importance of a healthy diet is discussed. They do have a healthy diet.  The benefits of regular aerobic exercise were discussed. She walks 21-3 times per week, limited by back pain by persistent back pain and neck pain .   Depression screen: there are no signs or vegative symptoms of depression- irritability, change in appetite, anhedonia, sadness/tearfullness.  Cognitive assessment: the patient manages all their financial and personal affairs and is actively engaged. They could relate day,date,year and events; recalled 2/3 objects at 3  minutes; performed clock-face test normally.  The following portions of the patient's history were reviewed and updated as appropriate: allergies, current medications, past family history, past medical history,  past surgical history, past social history  and problem list.  Visual acuity was not assessed per patient preference since she has regular follow up with her ophthalmologist. Hearing and body mass index were assessed and reviewed.   During the course of the visit the patient was educated and counseled about appropriate screening and preventive services including : fall prevention , diabetes screening, nutrition counseling, colorectal cancer screening, and recommended immunizations.    CC: The primary encounter diagnosis was Essential hypertension. Diagnoses of Numbness and tingling of right arm, Chronic left-sided low back pain with left-sided sciatica, CKD (chronic kidney disease) stage 3, GFR 30-59 ml/min ([facility]), and Encounter for preventive health examination were also pertinent to this visit.  Neck pain aggravated by falling asleep in front of the TV.  Was told in the past by [facility] that she had a "growth " on her neck  Had MRI cervical spine bulging disks at C5 and C6 with no cord involvement .  Does not radiate   Low back pain :  Aggravated by standing greater than 10 minutes.  Does radiate left buttock but not below. History of prior back surgery  In 2010 after developing sciatica on the left. Has PT starting tomorrow, taking advil but has stopped this at my request and is using tylenol and ALEVE!   History Jamie Gala  has a past medical history of Granuloma annulare, Hyperlipidemia, Hypertension, Menopause, and Plantar fasciitis of right foot (12/24/2014).   She has a past surgical history that includes Spine surgery (2010); Appendectomy; Venous ablation; Dilation and curettage of uterus; and Right oophorectomy (Right, feb 2014).   Her family history includes Cancer in her brother;  Early death in her mother; Heart attack in her father; Heart disease in her brother, father, and sister.She reports that she has never smoked. She has never used smokeless tobacco. She reports that she does not drink alcohol or use drugs.  Outpatient Medications Prior to Visit  Medication Sig Dispense Refill  . albuterol (PROVENTIL HFA;VENTOLIN HFA) 108 (90 Base) MCG/ACT inhaler Inhale 1 puff into the lungs every 6 (six) hours as needed for wheezing or shortness of breath.    . allopurinol (ZYLOPRIM) 100 MG tablet TAKE 1 TABLET BY MOUTH  DAILY 90 tablet 1  . ANORO ELLIPTA 62.5-25 MCG/INH AEPB Inhale 1 puff into the lungs daily. 1 each 6  . Coenzyme Q10 (CO Q 10 PO) Take by mouth daily.    Marland Kitchen conjugated estrogens (PREMARIN) vaginal cream Place 1 Applicatorful vaginally 2 (two) times a week. 42.5 g 12  . ezetimibe (ZETIA) 10 MG tablet Take 1 tablet (10 mg total) by mouth daily. 90 tablet 4  . fluticasone (FLONASE) 50 MCG/ACT nasal spray USE 2 SPRAYS IN EACH  NOSTRIL DAILY 48 g 3  . hydrochlorothiazide (MICROZIDE) 12.5 MG capsule Take 1 capsule (12.5 mg total) by mouth daily. 90 capsule 0  . HYDROcodone-acetaminophen (NORCO/VICODIN) 5-325 MG tablet Take 1 tablet every 6 (six) hours as needed by mouth. (Patient not taking: Reported on 03/15/2018) 10 tablet 0  . Lactulose 20 GM/30ML SOLN 30 ml every 4 hours until constipation is relieved 236 mL 3  . losartan (COZAAR) 100 MG tablet Take 1 tablet (100 mg total) by mouth daily. 90 tablet 0  . metoprolol succinate (TOPROL-XL) 25 MG 24 hr tablet TAKE 1 TABLET BY MOUTH  DAILY 90 tablet 1  . pantoprazole (PROTONIX) 40 MG tablet TAKE 1 TABLET BY MOUTH  DAILY 90 tablet 1  . sertraline (ZOLOFT) 100 MG tablet TAKE 1 TABLET BY MOUTH  EVERY DAY 90 tablet 1  . simvastatin (ZOCOR) 40 MG tablet TAKE 1 TABLET BY MOUTH AT  BEDTIME 90 tablet 1  . meloxicam (MOBIC) 15 MG tablet TAKE 1 TABLET BY MOUTH  DAILY (Patient not taking: Reported on 03/15/2018) 90 tablet 1   No  facility-administered medications prior to visit.     Review of Systems   Patient denies headache, fevers, malaise, unintentional weight loss, skin rash, eye pain, sinus congestion and sinus pain, sore throat, dysphagia,  hemoptysis , cough, dyspnea, wheezing, chest pain, palpitations, orthopnea, edema, abdominal pain, nausea, melena, diarrhea, constipation, flank pain, dysuria, hematuria, urinary  Frequency, nocturia, numbness, tingling, seizures,  Focal weakness, Loss of consciousness,  Tremor, insomnia, depression, anxiety, and suicidal ideation.      Objective:  BP 132/74 (BP Location: Left Arm, Patient Position: Sitting, Cuff Size: Normal)   Pulse 73   Temp 97.9 F (36.6 C) (Oral)   Resp 15   Ht 5\' 4"  (1.626 m)   Wt 195 lb 6.4 oz (88.6 kg)   SpO2 96%   BMI 33.54 kg/m   Physical Exam   General appearance: alert, cooperative and appears stated age Head: Normocephalic, without obvious abnormality, atraumatic Eyes: conjunctivae/corneas clear. PERRL, EOM's intact. Fundi benign. Ears: normal TM's and external ear canals both ears  Nose: Nares normal. Septum midline. Mucosa normal. No drainage or sinus tenderness. Throat: lips, mucosa, and tongue normal; teeth and gums normal Neck: no adenopathy, no carotid bruit, no JVD, supple, symmetrical, trachea midline and thyroid not enlarged, symmetric, no tenderness/mass/nodules Lungs: clear to auscultation bilaterally Breasts: normal appearance, no masses or tenderness Heart: regular rate and rhythm, S1, S2 normal, no murmur, click, rub or gallop Abdomen: soft, non-tender; bowel sounds normal; no masses,  no organomegaly Extremities: extremities normal, atraumatic, no cyanosis or edema Pulses: 2+ and symmetric Skin: Skin color, texture, turgor normal. No rashes or lesions Neurologic: Alert and oriented X 3, normal strength and tone. Normal symmetric reflexes. Normal coordination and gait.      Assessment & Plan:   Problem List Items  Addressed This Visit    CKD (chronic kidney disease) stage 3, GFR 30-59 ml/min ([facility])    GFR improved with avoidance of NSAIDs      Encounter for preventive health examination    age appropriate education and counseling updated, referrals for preventative services and immunizations addressed, dietary and smoking counseling addressed, most recent labs reviewed.  I have personally reviewed and have noted:  1) the patient's medical and social history 2) The pt's use of alcohol, tobacco, and illicit drugs 3) The patient's current medications and supplements 4) Functional ability including ADL's, fall risk, home safety risk, hearing and visual impairment 5) Diet and physical activities 6) Evidence for depression or mood disorder 7) The patient's height, weight, and BMI have been recorded in the chart  I have made referrals, and provided counseling and education based on review of the above      Hypertension - Primary   Relevant Orders   Basic metabolic panel (Completed)   Low back pain    With left sided radiculopathy to buttock.  She has had lumbar decompression surgery iin 2010 and is starting PT tomorrow .  Advised to avoid NSAIDs due to recent decline in GFR      Relevant Medications   traMADol (ULTRAM) 50 MG tablet   Numbness and tingling of right arm    Intermittent,  With neck pain from known  DDD.  No workup unless symptms become constant          I have discontinued Jamie Anderson's meloxicam. I am also having her start on traMADol. Additionally, I am having her maintain her conjugated estrogens, HYDROcodone-acetaminophen, Coenzyme Q10 (CO Q 10 PO), albuterol, ezetimibe, ANORO ELLIPTA, Lactulose, pantoprazole, metoprolol succinate, sertraline, simvastatin, fluticasone, allopurinol, losartan, and hydrochlorothiazide.  Meds ordered this encounter  Medications  . traMADol (ULTRAM) 50 MG tablet    Sig: Take 1 tablet (50 mg total) by mouth every 8 (eight) hours as needed.     Dispense:  30 tablet    Refill:  0    Medications Discontinued During This Encounter  Medication Reason  . meloxicam (MOBIC) 15 MG tablet     Follow-up: Return in about 6 months (around 09/12/2018).   Crecencio Mc, MD

## 2018-03-14 NOTE — Patient Instructions (Addendum)
Your annual mammogram has been ordered since Nov 25th  .  You are responsible for  Calling [facility] to  make your appointment , per their request  [phone]  Do not take aleve,  advil or meloxicam.  Use only tylenol and tramadol for your pain   If your back pain does not improve after 6 weeks of PT,  I will order an MRI  Your neck pain is from arthritis and mild degenerative changes.  You do not need imaging unless you start having "electrical sensations"  Down your arms or on your scalp   Health Maintenance for Postmenopausal Women Menopause is a normal process in which your reproductive ability comes to an end. This process happens gradually over a span of months to years, usually between the ages of 79 and 43. Menopause is complete when you have missed 12 consecutive menstrual periods. It is important to talk with your health care provider about some of the most common conditions that affect postmenopausal women, such as heart disease, cancer, and bone loss (osteoporosis). Adopting a healthy lifestyle and getting preventive care can help to promote your health and wellness. Those actions can also lower your chances of developing some of these common conditions. What should I know about menopause? During menopause, you may experience a number of symptoms, such as:  Moderate-to-severe hot flashes.  Night sweats.  Decrease in sex drive.  Mood swings.  Headaches.  Tiredness.  Irritability.  Memory problems.  Insomnia. Choosing to treat or not to treat menopausal changes is an individual decision that you make with your health care provider. What should I know about hormone replacement therapy and supplements? Hormone therapy products are effective for treating symptoms that are associated with menopause, such as hot flashes and night sweats. Hormone replacement carries certain risks, especially as you become older. If you are thinking about using estrogen or estrogen with  progestin treatments, discuss the benefits and risks with your health care provider. What should I know about heart disease and stroke? Heart disease, heart attack, and stroke become more likely as you age. This may be due, in part, to the hormonal changes that your body experiences during menopause. These can affect how your body processes dietary fats, triglycerides, and cholesterol. Heart attack and stroke are both medical emergencies. There are many things that you can do to help prevent heart disease and stroke:  Have your blood pressure checked at least every 1-2 years. High blood pressure causes heart disease and increases the risk of stroke.  If you are 28-78 years old, ask your health care provider if you should take aspirin to prevent a heart attack or a stroke.  Do not use any tobacco products, including cigarettes, chewing tobacco, or electronic cigarettes. If you need help quitting, ask your health care provider.  It is important to eat a healthy diet and maintain a healthy weight. ? Be sure to include plenty of vegetables, fruits, low-fat dairy products, and lean protein. ? Avoid eating foods that are high in solid fats, added sugars, or salt (sodium).  Get regular exercise. This is one of the most important things that you can do for your health. ? Try to exercise for at least 150 minutes each week. The type of exercise that you do should increase your heart rate and make you sweat. This is known as moderate-intensity exercise. ? Try to do strengthening exercises at least twice each week. Do these in addition to the moderate-intensity exercise.  Know your  numbers.Ask your health care provider to check your cholesterol and your blood glucose. Continue to have your blood tested as directed by your health care provider.  What should I know about cancer screening? There are several types of cancer. Take the following steps to reduce your risk and to catch any cancer development as  early as possible. Breast Cancer  Practice breast self-awareness. ? This means understanding how your breasts normally appear and feel. ? It also means doing regular breast self-exams. Let your health care provider know about any changes, no matter how small.  If you are 23 or older, have a clinician do a breast exam (clinical breast exam or CBE) every year. Depending on your age, family history, and medical history, it may be recommended that you also have a yearly breast X-ray (mammogram).  If you have a family history of breast cancer, talk with your health care provider about genetic screening.  If you are at high risk for breast cancer, talk with your health care provider about having an MRI and a mammogram every year.  Breast cancer (BRCA) gene test is recommended for women who have family members with BRCA-related cancers. Results of the assessment will determine the need for genetic counseling and BRCA1 and for BRCA2 testing. BRCA-related cancers include these types: ? Breast. This occurs in males or females. ? Ovarian. ? Tubal. This may also be called fallopian tube cancer. ? Cancer of the abdominal or pelvic lining (peritoneal cancer). ? Prostate. ? Pancreatic. Cervical, Uterine, and Ovarian Cancer Your health care provider may recommend that you be screened regularly for cancer of the pelvic organs. These include your ovaries, uterus, and vagina. This screening involves a pelvic exam, which includes checking for microscopic changes to the surface of your cervix (Pap test).  For women ages 21-65, health care providers may recommend a pelvic exam and a Pap test every three years. For women ages 67-65, they may recommend the Pap test and pelvic exam, combined with testing for human papilloma virus (HPV), every five years. Some types of HPV increase your risk of cervical cancer. Testing for HPV may also be done on women of any age who have unclear Pap test results.  Other health care  providers may not recommend any screening for nonpregnant women who are considered low risk for pelvic cancer and have no symptoms. Ask your health care provider if a screening pelvic exam is right for you.  If you have had past treatment for cervical cancer or a condition that could lead to cancer, you need Pap tests and screening for cancer for at least 20 years after your treatment. If Pap tests have been discontinued for you, your risk factors (such as having a new sexual partner) need to be reassessed to determine if you should start having screenings again. Some women have medical problems that increase the chance of getting cervical cancer. In these cases, your health care provider may recommend that you have screening and Pap tests more often.  If you have a family history of uterine cancer or ovarian cancer, talk with your health care provider about genetic screening.  If you have vaginal bleeding after reaching menopause, tell your health care provider.  There are currently no reliable tests available to screen for ovarian cancer. Lung Cancer Lung cancer screening is recommended for adults 20-43 years old who are at high risk for lung cancer because of a history of smoking. A yearly low-dose CT scan of the lungs is recommended if  you:  Currently smoke.  Have a history of at least 30 pack-years of smoking and you currently smoke or have quit within the past 15 years. A pack-year is smoking an average of one pack of cigarettes per day for one year. Yearly screening should:  Continue until it has been 15 years since you quit.  Stop if you develop a health problem that would prevent you from having lung cancer treatment. Colorectal Cancer  This type of cancer can be detected and can often be prevented.  Routine colorectal cancer screening usually begins at age 93 and continues through age 66.  If you have risk factors for colon cancer, your health care provider may recommend that you  be screened at an earlier age.  If you have a family history of colorectal cancer, talk with your health care provider about genetic screening.  Your health care provider may also recommend using home test kits to check for hidden blood in your stool.  A small camera at the end of a tube can be used to examine your colon directly (sigmoidoscopy or colonoscopy). This is done to check for the earliest forms of colorectal cancer.  Direct examination of the colon should be repeated every 5-10 years until age 63. However, if early forms of precancerous polyps or small growths are found or if you have a family history or genetic risk for colorectal cancer, you may need to be screened more often. Skin Cancer  Check your skin from head to toe regularly.  Monitor any moles. Be sure to tell your health care provider: ? About any new moles or changes in moles, especially if there is a change in a mole's shape or color. ? If you have a mole that is larger than the size of a pencil eraser.  If any of your family members has a history of skin cancer, especially at a young age, talk with your health care provider about genetic screening.  Always use sunscreen. Apply sunscreen liberally and repeatedly throughout the day.  Whenever you are outside, protect yourself by wearing long sleeves, pants, a wide-brimmed hat, and sunglasses. What should I know about osteoporosis? Osteoporosis is a condition in which bone destruction happens more quickly than new bone creation. After menopause, you may be at an increased risk for osteoporosis. To help prevent osteoporosis or the bone fractures that can happen because of osteoporosis, the following is recommended:  If you are 37-80 years old, get at least 1,000 mg of calcium and at least 600 mg of vitamin D per day.  If you are older than age 41 but younger than age 29, get at least 1,200 mg of calcium and at least 600 mg of vitamin D per day.  If you are older than  age 35, get at least 1,200 mg of calcium and at least 800 mg of vitamin D per day. Smoking and excessive alcohol intake increase the risk of osteoporosis. Eat foods that are rich in calcium and vitamin D, and do weight-bearing exercises several times each week as directed by your health care provider. What should I know about how menopause affects my mental health? Depression may occur at any age, but it is more common as you become older. Common symptoms of depression include:  Low or sad mood.  Changes in sleep patterns.  Changes in appetite or eating patterns.  Feeling an overall lack of motivation or enjoyment of activities that you previously enjoyed.  Frequent crying spells. Talk with your health  care provider if you think that you are experiencing depression. What should I know about immunizations? It is important that you get and maintain your immunizations. These include:  Tetanus, diphtheria, and pertussis (Tdap) booster vaccine.  Influenza every year before the flu season begins.  Pneumonia vaccine.  Shingles vaccine. Your health care provider may also recommend other immunizations. This information is not intended to replace advice given to you by your health care provider. Make sure you discuss any questions you have with your health care provider. Document Released: 04/16/2005 Document Revised: 09/12/2015 Document Reviewed: 11/26/2014 Elsevier Interactive Patient Education  2019 [employer].

## 2018-03-15 ENCOUNTER — Other Ambulatory Visit: Payer: Self-pay

## 2018-03-15 ENCOUNTER — Encounter: Payer: Self-pay | Admitting: Physical Therapy

## 2018-03-15 ENCOUNTER — Ambulatory Visit: Payer: Medicare Other | Attending: Internal Medicine | Admitting: Physical Therapy

## 2018-03-15 DIAGNOSIS — M542 Cervicalgia: Secondary | ICD-10-CM | POA: Insufficient documentation

## 2018-03-15 DIAGNOSIS — M6281 Muscle weakness (generalized): Secondary | ICD-10-CM | POA: Diagnosis not present

## 2018-03-15 DIAGNOSIS — M62838 Other muscle spasm: Secondary | ICD-10-CM | POA: Diagnosis not present

## 2018-03-15 DIAGNOSIS — M545 Low back pain: Secondary | ICD-10-CM | POA: Diagnosis not present

## 2018-03-15 DIAGNOSIS — R262 Difficulty in walking, not elsewhere classified: Secondary | ICD-10-CM

## 2018-03-15 DIAGNOSIS — N183 Chronic kidney disease, stage 3 unspecified: Secondary | ICD-10-CM | POA: Insufficient documentation

## 2018-03-15 DIAGNOSIS — M48061 Spinal stenosis, lumbar region without neurogenic claudication: Secondary | ICD-10-CM | POA: Insufficient documentation

## 2018-03-15 DIAGNOSIS — G8929 Other chronic pain: Secondary | ICD-10-CM | POA: Diagnosis not present

## 2018-03-15 NOTE — Assessment & Plan Note (Signed)
With left sided radiculopathy to buttock.  She has had lumbar decompression surgery iin 2010 and is starting PT tomorrow .  Advised to avoid NSAIDs due to recent decline in GFR

## 2018-03-15 NOTE — Assessment & Plan Note (Signed)

## 2018-03-15 NOTE — Assessment & Plan Note (Signed)
Intermittent,  With neck pain from known  DDD.  No workup unless symptms become constant

## 2018-03-15 NOTE — Assessment & Plan Note (Signed)
GFR improved with avoidance of NSAIDs

## 2018-03-15 NOTE — Therapy (Signed)
[facility] [address] [place], [address] Phone: [phone]   Fax:  [phone]  Physical Therapy Evaluation  Patient Details  Name: Jamie Anderson MRN: [id number] Date of Birth: 07-Sep-1935 Referring Provider (PT): Crecencio Mc, MD   Encounter Date: 03/15/2018  PT End of Session - 03/16/18 1947    Visit Number  1    Number of Visits  12    Date for PT Re-Evaluation  04/27/18    Authorization Type  [employer] Medicare reporting period from 03/15/2018    Authorization Time Period  Current auth period: 03/15/2018 - 04/27/2018 (latest PN: 03/15/2018)    Authorization - Visit Number  1    Authorization - Number of Visits  10    PT Start Time  1430    PT Stop Time  1530    PT Time Calculation (min)  60 min    Activity Tolerance  Patient tolerated treatment well    Behavior During Therapy  [facility] for tasks assessed/performed       Past Medical History:  Diagnosis Date  . Granuloma annulare   . Hyperlipidemia   . Hypertension   . Menopause   . Plantar fasciitis of right foot 12/24/2014    Past Surgical History:  Procedure Laterality Date  . APPENDECTOMY    . DILATION AND CURETTAGE OF UTERUS    . RIGHT OOPHORECTOMY Right feb 2014   [name]  . [place]  2010  . VENOUS ABLATION     bilateral    There were no vitals filed for this visit.   Subjective Assessment - 03/15/18 1502    Subjective  Patient states she has had back pain on and off for years and a history of back surgery. She states it hurts when she stands and when she gets up and down. She has been advised not to take ibuprofen and they have prescribed other medications for her that she has yet to pick up. She states the pain started on the right side and now it is more on the left side and across the back. She feels uncomfortable if she does too much and if she rests it usually gets better. The last couple of months it has been worse. She had some  chest pain after cutting shrubbery recently. She feels like the pain is always present but gets better and worse.     Pertinent History  Patient is a 83 y.o. female who presents to outpatient physical therapy with a referral for medical diagnosis chronic midline low back pain without sciatica. This patient's chief complaints consist of pain, stiffness, and decreased activity tolerance, leading to the following functional deficits: difficulty with standing activities, getting up and down, walking, gardening, housework, and social activities. Relevant past medical history and comorbidities include previous lumbar surgery for a disc (not a fusion), hypertension, hilar adenopathy, bilateral knee arthritis (left gives way), obesity, gout, atherosclerosis, skin cancer (recently removed - no other treatment), history of foot pain. Denies diabetes, long term steroid use, diagnosis of osteopenia or osteoporosis, latex allergy.     Limitations  Walking;House hold activities;Standing;Other (comment)   gardening, housework, cooking   Currently in Pain?  Yes    Pain Score  3     Pain Location  Back    Pain Orientation  Lower    Pain Descriptors / Indicators  --   uncomfortable   Pain Type  Chronic pain    Pain Radiating Towards  She states the pain started on the right side and now it is more on the left side and across the back.    Pain Onset  More than a month ago    Pain Frequency  Constant    Aggravating Factors   standing, getting up and down, doing too much activity such as gardening, lifting, carrying, cooking.     Pain Relieving Factors  sitting, medication, heat, ice    Effect of Pain on Daily Activities  Limits ability to do valued activities such as yardwork, gardening, walking, walking up hills, community participation, home ecoration and housework, cooking.       SUBJECTIVE: HISTORY:  Patient is a 83 y.o. female who presents to outpatient physical therapy with a referral for medical diagnosis  chronic midline low back pain without sciatica. This patient's chief complaints consist of pain, stiffness, and decreased activity tolerance, leading to the following functional deficits: difficulty with standing activities, getting up and down, walking, gardening, housework, and social activities.  Relevant past medical history and comorbidities include previous lumbar surgery for a disc (not a fusion), hypertension, hilar adenopathy, bilateral knee arthritis (left gives way), obesity, gout, atherosclerosis, skin cancer (recently removed - no other treatment), history of foot pain. Denies diabetes, long term steroid use, diagnosis of osteopenia or osteoporosis, latex allergy.  Imaging: radiograph report dated 01/31/2018: "Multilevel degenerative disc disease. Mild facet joint degenerative change at L4-5 and L5-S1. No compression fracture or spondylolisthesis." Response to previously administered skilled services: had physical therapy several years ago, but unsure if it was for her back or not. Thinks it was probably helpful.      Patient states she has had back pain on and off for years and a history of back surgery. She states it hurts when she stands and when she gets up and down. She has been advised not to take ibuprofen and they have prescribed other medications for her that she has yet to pick up. She states the pain started on the right side and now it is more on the left side and across the back. She feels uncomfortable if she does too much and if she rests it usually gets better. The last couple of months it has been worse. She had some chest pain after cutting shrubbery recently. She feels like the pain is always present but gets better and worse.     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   PAIN Pain Scale:  Patient reports current pain as 3/10, at best 3/10, at worst 5/10, and during functional activity 5/10. Paresthesia: denies currently, did have some down left leg prior to surgery. Not since then.  24-hour pattern: none noted   [facility] PT Assessment - 03/16/18 0001      Assessment   Medical Diagnosis  Chronic midline low back pain without sciatica    Referring Provider (PT)  Crecencio Mc, MD    Next MD Visit  6 months from referral    Prior Therapy  had physical therapy several years ago, but unsure if it was for her back or not. Thinks it was probably helpful.          Precautions   Precautions  None      Restrictions   Weight Bearing Restrictions  No  Balance Screen   Has the patient fallen in the past 6 months  No    Has the patient had a decrease in activity level because of a fear of falling?   No    Is the patient reluctant to leave their home because of a fear of falling?   No      Home Social worker  Private residence    Living Arrangements  Alone    Available Help at Discharge  Family;Friend(s)    Type of [place]  One level      Prior Function   Level of Independence  Independent    Leisure  gardening, yardwork, walking, housework      Cognition   Overall Cognitive Status  Within Functional Limits for tasks assessed      Observation/Other Assessments   Observations  See note from 03/15/2017 for latest objective data    Focus on Therapeutic Outcomes (FOTO)   55        OBJECTIVE: OBSERVATION/INSPECTION: Patient presents with flattened lumbar lordosis  NEUROLOGICAL: Dermatomes: pt with difficulty telling if they feel the same or different on BLE especially L3, L4, and S2 left possibly diminished to light touch. Myotomes: BLE WNL except 3+/5 at left dorsiflexion Reflexes:  - Quadriceps reflex (L4): R = 0, L = 2+. - Achilles reflex (S1): R = 0, L =  0. Upper Motor Neuron Screen: Hoffman's, and Clonus (ankle) negative bilaterally.   SPINE MOTION Lumbar AROM:  *Indicates pain - Flexion: = mid shins, increased pain. - Extension: = 25% did not increase back pain. - Rotation: R = 75 right pain, L = 50%. - Side Flexion: R = 75, L = 50.  PERIPHERAL JOINT MOTION (AROM/PROM in degrees):  *Indicates pain - BLE grossly WFL  STRENGTH:  *Indicates pain -  Hip  - Flexion: R = 4/5, L = 4/5. - Extension: R = 4+/5, L = 4+/5. - Abduction: R = 4/5, L = 4/5. Knee - Ext: R = 5/5, L = 5/5. - Flex: R = 4/5, L = 4/5. Ankle (seated position) - Dorsiflexion: R = 5/5, L = 3+/5. - Able to heel and toe walk with BUE  ACCESSORY MOTION:  - tender and hypomobile to CPA throughout lumbar and thoracic spine  SPECIAL TESTS: Straight leg raise (SLR): B = positive in hamstring, sensitizing maneuver. FABER: R = + back, L = + back.  FUNCTIONAL MOBILITY: - Bed mobility: rolling and supine <> sit = I. - Transfers: sit <> stand I with pain - Gait: ambulates I household and short community ambulation.  - Stairs: not tested  Objective measurements completed on examination: See above findings.      TREATMENT:   Therapeutic exercise: to centralize symptoms and improve ROM and strength required for successful completion of functional activities.  - Education on diagnosis, prognosis, POC, anatomy and physiology of current condition.  - Education on HEP including handout  - Sidelying clam shell hip abduction/ER. Cuing to stack hips and prevent hip motion. X10, to improve hip strength.  - hooklying lower trunk rotation to improve lumbar mobility. Cuing for how to perform and how far to move leg.    HOME EXERCISE PROGRAM  Access Code: [email or web address]  URL: [email or web address]  Date: 03/15/2018  Prepared by: Rosita Kea   Exercises  Clamshell - 10-15 reps - 1 second hold - 3 Sets - 2x daily - 7x weekly  Supine Lower Trunk Rotation -  20  reps - 3 second hold - 1 Sets - 2x daily - 7x weekly  Patient response to treatment:  Pt tolerated treatment well. Pt was able to complete all exercises with minimal to no lasting increase in pain or discomfort. Pt required cuing for proper technique and to facilitate improved neuromuscular control, strength, range of motion, and functional ability.    PT Education - 03/15/18 1435    Education Details  - Education on HEP including handout - Education on diagnosis, prognosis, POC, anatomy and physiology of current condition.  exercise form/purpose    Person(s) Educated  Patient    Methods  Explanation;Demonstration;Tactile cues;Verbal cues;Handout    Comprehension  Verbalized understanding;Returned demonstration       PT Short Term Goals - 03/16/18 2004      PT SHORT TERM GOAL #1   Title  Be independent with initial home exercise program for self-management of symptoms.    Baseline  initial HEP provided at IE (03/15/2018)    Time  2    Period  Weeks    Status  New    Target Date  03/30/18        PT Long Term Goals - 03/16/18 2005      PT LONG TERM GOAL #1   Title  Be independent with a long-term home exercise program for self-management of symptoms    Baseline  initial HEP provided at IE (03/15/2018):     Time  6    Period  Weeks    Status  New    Target Date  04/27/18      PT LONG TERM GOAL #2   Title  Demonstrate improved FOTO score by 10 units to demonstrate improvement in overall condition and self-reported functional ability.     Baseline  FOTO = 55 (03/15/2018)    Time  6    Period  Weeks    Status  New    Target Date  04/27/18      PT LONG TERM GOAL #3   Title  Improve bilateral LE  strength to 4+/5 for improved ability to allow patient to complete valued functional tasks such as reaching overhead and quilting with less difficulty.     Baseline  see objective exam (03/15/2018);     Time  6    Period  Weeks    Status  New    Target Date  04/27/18      PT LONG TERM  GOAL #4   Title  Have full lumbar AROM with no compensations or increase in pain in all planes except intermittent end range discomfort to allow patient to complete valued activities with less difficulty.     Baseline  see objective exam (03/15/2018);    Time  6    Period  Weeks    Status  New    Target Date  04/27/18      PT LONG TERM GOAL #5   Title  Complete community, work and/or recreational activities without limitation due to current condition.     Baseline   limits her walking, ability to do yardwork, difficulty walking up hills, limites bending, lifting, housework.  (03/15/2018);    Time  6    Period  Weeks    Status  New    Target Date  04/27/18             Plan - 03/16/18 1957    Clinical Impression Statement  Patient is a 83 y.o. female  referred to  outpatient physical therapy with a medical diagnosis of chronic midline low back pain without sciatica who presents with signs and symptoms consistent with low back pain, decreased trunk and hip strength, stiffness, reduced activity tolerance limiting her ability to perform basic ADLs, IADLs, ambulation, gardening, and usual social and community activities. She would benefit from skilled physical therapy to address her impairments and functional limitations and return to PLOF.    History and Personal Factors relevant to plan of care:  previous lumbar surgery for a disc (not a fusion), hypertension, hilar adenopathy, bilateral knee arthritis (left gives way), obesity, gout, atherosclerosis, skin cancer (recently removed - no other treatment), history of foot pain., chronic nature of pain    Clinical Presentation  Evolving    Clinical Presentation due to:  flair up a couple of months ago that is not improving as expected    Clinical Decision Making  Moderate    Rehab Potential  Good    Clinical Impairments Affecting Rehab Potential  (+) motivation, history of improvement with PT (-) chronicity of symptoms, lack of physical  conditioning, multiple comorbidities    PT Frequency  2x / week    PT Duration  6 weeks    PT Treatment/Interventions  ADLs/Self Care Home Management;Aquatic Therapy;Cryotherapy;Electrical Stimulation;Moist Heat;Traction;Gait training;Stair training;Functional mobility training;Therapeutic activities;Therapeutic exercise;Balance training;Neuromuscular re-education;Patient/family education;Manual techniques;Passive range of motion;Dry needling;Joint Manipulations;Spinal Manipulations;Other (comment)   joint mobilizations grades I-V, pain neuroscience education   PT Next Visit Plan  assess response to HEP and progress as appropriate. More fully assess for directional preference.     PT Home Exercise Plan  Medbridge Access Code: [email or web address]     Consulted and Agree with Plan of Care  Patient         Patient will benefit from skilled therapeutic intervention in order to improve the following deficits and impairments:  Decreased activity tolerance, Decreased endurance, Decreased range of motion, Decreased strength, Hypomobility, Impaired perceived functional ability, Pain, Decreased mobility, Difficulty walking, Impaired flexibility, Obesity, Other (comment)(decreased knowledge of condition and optimal self-management techniques, lack of physical activity levels reccomended for general health)  Visit Diagnosis: Chronic bilateral low back pain, unspecified whether sciatica present  Difficulty in walking, not elsewhere classified  Muscle weakness (generalized)     Problem List Patient Active Problem List   Diagnosis Date Noted  . Low back pain 03/15/2018  . CKD (chronic kidney disease) stage 3, GFR 30-59 ml/min (HCC) 03/15/2018  . Intermittent right lower quadrant abdominal pain 07/31/2017  . Atypical chest pain 01/22/2017  . Hilar adenopathy 01/22/2017  . Aortic atherosclerosis ([place]) 01/22/2017  . Post-menopausal atrophic vaginitis 01/18/2016  . Encounter for preventive health examination  01/18/2016  . Dysuria 04/10/2015  . Colon cancer screening 01/16/2015  . Numbness and tingling of right arm 01/16/2015  . Obesity 12/31/2012  . Gluten-sensitive enteropathy 12/29/2012  . Obsessive compulsive disorder 12/29/2012  . Cystic teratoma of right ovary 11/23/2012  . Gout 04/13/2012  . Right knee DJD 04/03/2012  . Medicare annual wellness visit, subsequent 11/26/2011  . Hyperlipidemia LDL goal <130 05/25/2011  . Hypertension 05/25/2011    Kimbely Nordmann, PT, DPT 03/16/2018, 8:09 PM  [facility] [address] [place], [address] Phone: [phone]   Fax:  [phone]  Name: Jamie [facility] MRN: [id number] Date of Birth: [birth year]

## 2018-03-17 DIAGNOSIS — Z961 Presence of intraocular lens: Secondary | ICD-10-CM | POA: Diagnosis not present

## 2018-03-20 ENCOUNTER — Ambulatory Visit: Payer: Medicare Other | Admitting: Physical Therapy

## 2018-03-20 DIAGNOSIS — M542 Cervicalgia: Secondary | ICD-10-CM | POA: Diagnosis not present

## 2018-03-20 DIAGNOSIS — G8929 Other chronic pain: Secondary | ICD-10-CM

## 2018-03-20 DIAGNOSIS — M6281 Muscle weakness (generalized): Secondary | ICD-10-CM | POA: Diagnosis not present

## 2018-03-20 DIAGNOSIS — L570 Actinic keratosis: Secondary | ICD-10-CM | POA: Diagnosis not present

## 2018-03-20 DIAGNOSIS — M545 Low back pain: Secondary | ICD-10-CM | POA: Diagnosis not present

## 2018-03-20 DIAGNOSIS — M62838 Other muscle spasm: Secondary | ICD-10-CM | POA: Diagnosis not present

## 2018-03-20 DIAGNOSIS — Z85828 Personal history of other malignant neoplasm of skin: Secondary | ICD-10-CM | POA: Diagnosis not present

## 2018-03-20 DIAGNOSIS — L300 Nummular dermatitis: Secondary | ICD-10-CM | POA: Diagnosis not present

## 2018-03-20 DIAGNOSIS — L920 Granuloma annulare: Secondary | ICD-10-CM | POA: Diagnosis not present

## 2018-03-20 DIAGNOSIS — L578 Other skin changes due to chronic exposure to nonionizing radiation: Secondary | ICD-10-CM | POA: Diagnosis not present

## 2018-03-20 DIAGNOSIS — R262 Difficulty in walking, not elsewhere classified: Secondary | ICD-10-CM | POA: Diagnosis not present

## 2018-03-20 NOTE — Therapy (Signed)
[facility] [address] [place], [address] Phone: [phone]   Fax:  [phone]  Physical Therapy Treatment  Patient Details  Name: Jamie Anderson MRN: [id number] Date of Birth: Apr 26, 1935 Referring Provider (PT): Crecencio Mc, MD   Encounter Date: 03/20/2018  PT End of Session - 03/21/18 1418    Visit Number  2    Number of Visits  12    Date for PT Re-Evaluation  04/27/18    Authorization Type  [employer] Medicare reporting period from 03/15/2018    Authorization Time Period  Current auth period: 03/15/2018 - 04/27/2018 (latest PN: 03/15/2018)    Authorization - Visit Number  2    Authorization - Number of Visits  10    PT Start Time  1435    PT Stop Time  1520    PT Time Calculation (min)  45 min    Activity Tolerance  Patient tolerated treatment well    Behavior During Therapy  [facility] for tasks assessed/performed       Past Medical History:  Diagnosis Date  . Granuloma annulare   . Hyperlipidemia   . Hypertension   . Menopause   . Plantar fasciitis of right foot 12/24/2014    Past Surgical History:  Procedure Laterality Date  . APPENDECTOMY    . DILATION AND CURETTAGE OF UTERUS    . RIGHT OOPHORECTOMY Right feb 2014   [name]  . [place]  2010  . VENOUS ABLATION     bilateral    There were no vitals filed for this visit.  Subjective Assessment - 03/21/18 1415    Subjective  Patient reports she is feeling well. She has minimal to no low back pain upon arrival but states her neck has been bothering her a lot, moreso than her back. She reports no excessive pain or soreness following last treatment session. She reports compliance with HEP.     Pertinent History  Patient is a 83 y.o. female who presents to outpatient physical therapy with a referral for medical diagnosis chronic midline low back pain without sciatica. This patient's chief complaints consist of pain, stiffness, and decreased activity  tolerance, leading to the following functional deficits: difficulty with standing activities, getting up and down, walking, gardening, housework, and social activities. Relevant past medical history and comorbidities include previous lumbar surgery for a disc (not a fusion), hypertension, hilar adenopathy, bilateral knee arthritis (left gives way), obesity, gout, atherosclerosis, skin cancer (recently removed - no other treatment), history of foot pain. Denies diabetes, long term steroid use, diagnosis of osteopenia or osteoporosis, latex allergy.     Limitations  Walking;House hold activities;Standing;Other (comment)   gardening, housework, cooking   Currently in Pain?  Yes    Pain Score  1     Pain Location  Back    Pain Orientation  Lower;Mid    Pain Descriptors / Indicators  Discomfort    Pain Type  Chronic pain    Pain Radiating Towards  also neck pain that is worse when rotating head.     Pain Onset  More than a month ago    Pain Frequency  Constant        OBJECTIVE Cervical Spine screen: AROM limited to 25% in all direction except for flexion. Mild end range pain most motion. Slightly more pain on the right with left rotation, more limited in left rotation than right.  RUE = grossly 4-/10, LUE grossly 4+/10 Cervical  axial compression and distraction negative. Spurling's negative for peripheral symptoms bilaterally.  UE myotomes and dermatomes are apparently [facility]  TREATMENT:  Pt denies history of neck surgery Pt denies sensitivity to latex  Therapeutic exercise: to centralize symptoms and improve ROM and strength required for successful completion of functional activities.  Treadmill level up to 1.5 mph with SBA x 1 and 0% grade. For improved lower extremity mobility, muscular endurance, and weightbearing activity tolerance; and to induce the analgesic effect of aerobic exercise, stimulate improved joint nutrition, and prepare body structures and systems for following interventions.  X 5 minutes during subjective exam.  - repeated lumbar extension over counter 3 x 15, feels good after. Mild ERP produced during. To reduce back pain and teach exercise to make improve ability to control pain. Cuing for technique. Improved cervical spine motion. - cervical spine screen (see above) to allow pt to participate better in interventions for lumbar spine.  - seated posture correction with lumbar towel roll and instructions on how to use it at home to improve ability to stand up after sitting and reduce pain. Required cuing for proper roll placement and education on how to use at home.  - repeated cervical spine retraction x 10. Cuing for proper form. To reduce cervical spine pain to allow pt to better complete interventions for lumbar spine.  - repeated cervical spine retraction to extension x 10. Cuing for proper form. To reduce cervical spine pain to allow pt to better complete interventions for lumbar spine. Increased cervical spine pain.   - Standing rows with scapular retraction for improved postural and shoulder girdle strengthening and mobility. Required instruction for technique and cuing to retract, posteriorly tilt, and depress scapulae. x30 with red theraband.  - Education on diagnosis, prognosis, POC, anatomy and physiology of current condition.  - Education on HEP including handout    HOME EXERCISE PROGRAM Access Code: [email or web address]  URL: [email or web address]  Date: 03/20/2018  Prepared by: Rosita Kea   Exercises  Seated Posture with Lumbar Roll  Standing Lumbar Extension with Counter - 10-15 reps - 1 sets - 1 second hold - 4x daily  Seated Cervical Retraction and Extension - 10-15 reps - 1 second hold - 4x daily  Clamshell - 10-15 reps - 1 second hold - 3 Sets - 2x daily - 7x weekly  Supine Lower Trunk Rotation - 10-15 reps - 1 second hold - 3 Sets - 2x daily - 7x weekly   Patient response to treatment:  Pt tolerated treatment well. Pt was able to  complete all exercises with minimal to no lasting increase in pain or discomfort. Lumbar extension exercises were attempted with good results and improved low back pain during session, so this was added to HEP. Cervical spine was examined due to complaints of pain there that hindered her ability to complete interventions for the low back. She had some end range discomfort with cervical retraction and extension but no lasting arm symptoms. She was advised on exercises to perform for cervical spine independently at home to allow her to better participate in exercises targeting lumbar region. Patient required extra time for education. Pt required cuing for proper technique and to facilitate improved neuromuscular control, strength, range of motion, and functional ability.  PT Education - 03/21/18 1417    Education Details  - Education on [employer] including handout - Education on diagnosis, prognosis, POC, anatomy and physiology of current condition.  purpose and form correction for excercie. self-management advice.     Person(s)  Educated  Patient    Methods  Explanation;Demonstration;Tactile cues;Verbal cues;Handout    Comprehension  Verbalized understanding;Returned demonstration       PT Short Term Goals - 03/16/18 2004      PT SHORT TERM GOAL #1   Title  Be independent with initial home exercise program for self-management of symptoms.    Baseline  initial HEP provided at IE (03/15/2018)    Time  2    Period  Weeks    Status  New    Target Date  03/30/18        PT Long Term Goals - 03/16/18 2005      PT LONG TERM GOAL #1   Title  Be independent with a long-term home exercise program for self-management of symptoms    Baseline  initial HEP provided at IE (03/15/2018):     Time  6    Period  Weeks    Status  New    Target Date  04/27/18      PT LONG TERM GOAL #2   Title  Demonstrate improved FOTO score by 10 units to demonstrate improvement in overall condition and self-reported functional  ability.     Baseline  FOTO = 55 (03/15/2018)    Time  6    Period  Weeks    Status  New    Target Date  04/27/18      PT LONG TERM GOAL #3   Title  Improve bilateral LE  strength to 4+/5 for improved ability to allow patient to complete valued functional tasks such as reaching overhead and quilting with less difficulty.     Baseline  see objective exam (03/15/2018);     Time  6    Period  Weeks    Status  New    Target Date  04/27/18      PT LONG TERM GOAL #4   Title  Have full lumbar AROM with no compensations or increase in pain in all planes except intermittent end range discomfort to allow patient to complete valued activities with less difficulty.     Baseline  see objective exam (03/15/2018);    Time  6    Period  Weeks    Status  New    Target Date  04/27/18      PT LONG TERM GOAL #5   Title  Complete community, work and/or recreational activities without limitation due to current condition.     Baseline   limits her walking, ability to do yardwork, difficulty walking up hills, limites bending, lifting, housework.  (03/15/2018);    Time  6    Period  Weeks    Status  New    Target Date  04/27/18            Plan - 03/21/18 1433    Clinical Impression Statement  Patient has attended two treatment sessions this episode of care and is making progress towards goals. Her low back pain had improved by this session and was further improved with extension based exercises. However, she reports her neck has been bothering her and is hindering her ability to participate fully in exercises targeting the lumbar spine, so her cervical spine was screened and she was provided exercise to work on independently at home in her HEP to address her cervical spine pain and allow her to better participate in lumbar spine interventions. Patient is a 83 y.o. female referred to outpatient physical therapy with a medical diagnosis of chronic midline low  back pain without sciatica who presents with signs  and symptoms consistent with low back pain, decreased trunk and hip strength, stiffness, reduced activity tolerance limiting her ability to perform basic ADLs, IADLs, ambulation, gardening, and usual social and community activities. She would benefit from continued skilled physical therapy to address her impairments and functional limitations and return to PLOF.    Rehab Potential  Good    Clinical Impairments Affecting Rehab Potential  (+) motivation, history of improvement with PT (-) chronicity of symptoms, lack of physical conditioning, multiple comorbidities    PT Frequency  2x / week    PT Duration  6 weeks    PT Treatment/Interventions  ADLs/Self Care Home Management;Aquatic Therapy;Cryotherapy;Electrical Stimulation;Moist Heat;Traction;Gait training;Stair training;Functional mobility training;Therapeutic activities;Therapeutic exercise;Balance training;Neuromuscular re-education;Patient/family education;Manual techniques;Passive range of motion;Dry needling;Joint Manipulations;Spinal Manipulations;Other (comment)   joint mobilizations grades I-V, pain neuroscience education   PT Next Visit Plan  assess response to HEP and progress as appropriate. progress exercises and interventions as appropriate, focusing on specific exercises for directional preference and progressive trunk, postural, LE, and functional strengthening as tolerated.    PT Home Exercise Plan  [employer] Access Code: [email or web address]     Consulted and Agree with Plan of Care  Patient       Patient will benefit from skilled therapeutic intervention in order to improve the following deficits and impairments:  Decreased activity tolerance, Decreased endurance, Decreased range of motion, Decreased strength, Hypomobility, Impaired perceived functional ability, Pain, Decreased mobility, Difficulty walking, Impaired flexibility, Obesity, Other (comment)(decreased knowledge of condition and optimal self-management techniques, lack of physical  activity levels reccomended for general health)  Visit Diagnosis: Chronic bilateral low back pain, unspecified whether sciatica present  Difficulty in walking, not elsewhere classified  Muscle weakness (generalized)     Problem List Patient Active Problem List   Diagnosis Date Noted  . Low back pain 03/15/2018  . CKD (chronic kidney disease) stage 3, GFR 30-59 ml/min (HCC) 03/15/2018  . Intermittent right lower quadrant abdominal pain 07/31/2017  . Atypical chest pain 01/22/2017  . Hilar adenopathy 01/22/2017  . Aortic atherosclerosis ([place]) 01/22/2017  . Post-menopausal atrophic vaginitis 01/18/2016  . Encounter for preventive health examination 01/18/2016  . Dysuria 04/10/2015  . Colon cancer screening 01/16/2015  . Numbness and tingling of right arm 01/16/2015  . Obesity 12/31/2012  . Gluten-sensitive enteropathy 12/29/2012  . Obsessive compulsive disorder 12/29/2012  . Cystic teratoma of right ovary 11/23/2012  . Gout 04/13/2012  . Right knee DJD 04/03/2012  . Medicare annual wellness visit, subsequent 11/26/2011  . Hyperlipidemia LDL goal <130 05/25/2011  . Hypertension 05/25/2011    Latera Nordmann, PT, DPT 03/21/2018, 2:35 PM  [facility] [address] [place], [address] Phone: [phone]   Fax:  [phone]  Name: Jamie Anderson MRN: [id number] Date of Birth: 1936-01-14

## 2018-03-21 ENCOUNTER — Other Ambulatory Visit: Payer: Self-pay | Admitting: Internal Medicine

## 2018-03-21 ENCOUNTER — Encounter: Payer: Self-pay | Admitting: *Deleted

## 2018-03-21 ENCOUNTER — Encounter: Payer: Self-pay | Admitting: Physical Therapy

## 2018-03-21 DIAGNOSIS — Z1231 Encounter for screening mammogram for malignant neoplasm of breast: Secondary | ICD-10-CM

## 2018-03-22 ENCOUNTER — Ambulatory Visit: Payer: Medicare Other | Admitting: Physical Therapy

## 2018-03-22 DIAGNOSIS — R262 Difficulty in walking, not elsewhere classified: Secondary | ICD-10-CM | POA: Diagnosis not present

## 2018-03-22 DIAGNOSIS — G8929 Other chronic pain: Secondary | ICD-10-CM | POA: Diagnosis not present

## 2018-03-22 DIAGNOSIS — M545 Low back pain: Principal | ICD-10-CM

## 2018-03-22 DIAGNOSIS — M6281 Muscle weakness (generalized): Secondary | ICD-10-CM | POA: Diagnosis not present

## 2018-03-22 DIAGNOSIS — M542 Cervicalgia: Secondary | ICD-10-CM | POA: Diagnosis not present

## 2018-03-22 DIAGNOSIS — M62838 Other muscle spasm: Secondary | ICD-10-CM | POA: Diagnosis not present

## 2018-03-22 NOTE — Therapy (Signed)
[facility] [address] [place], [address] Phone: [phone]   Fax:  [phone]  Physical Therapy Treatment  Patient Details  Name: Jamie Anderson MRN: [id number] Date of Birth: 1935/03/18 Referring Provider (PT): Crecencio Mc, MD   Encounter Date: 03/22/2018  PT End of Session - 03/22/18 1504    Visit Number  3    Number of Visits  12    Date for PT Re-Evaluation  04/27/18    Authorization Type  [employer] Medicare reporting period from 03/15/2018    Authorization Time Period  Current auth period: 03/15/2018 - 04/27/2018 (latest PN: 03/15/2018)    Authorization - Visit Number  3    Authorization - Number of Visits  10    PT Start Time  1430    PT Stop Time  1510    PT Time Calculation (min)  40 min    Equipment Utilized During Treatment  Gait belt    Activity Tolerance  Patient tolerated treatment well;Patient limited by pain;Patient limited by fatigue    Behavior During Therapy  [facility] for tasks assessed/performed       Past Medical History:  Diagnosis Date  . Granuloma annulare   . Hyperlipidemia   . Hypertension   . Menopause   . Plantar fasciitis of right foot 12/24/2014    Past Surgical History:  Procedure Laterality Date  . APPENDECTOMY    . DILATION AND CURETTAGE OF UTERUS    . RIGHT OOPHORECTOMY Right feb 2014   [name]  . [place]  2010  . VENOUS ABLATION     bilateral    There were no vitals filed for this visit.  Subjective Assessment - 03/22/18 1434    Subjective  Patient report her neck is feeling a bit better than it was last session. She only needed one tylenol vs two. She reports her back region is also feeling better and she was able to cook this morning without pain which is better than it has been.  She feels a little stiff after getting out of the car. She reports she did some walking through the wall.  Patient reports no excessive soreness or pain following last session. HEP  is going well except she is getting some pain in her knees when doing repeated lumbar extension over the counter.  She thinks it is helping to put a roll behind her back.     Pertinent History  Patient is a 83 y.o. female who presents to outpatient physical therapy with a referral for medical diagnosis chronic midline low back pain without sciatica. This patient's chief complaints consist of pain, stiffness, and decreased activity tolerance, leading to the following functional deficits: difficulty with standing activities, getting up and down, walking, gardening, housework, and social activities. Relevant past medical history and comorbidities include previous lumbar surgery for a disc (not a fusion), hypertension, hilar adenopathy, bilateral knee arthritis (left gives way), obesity, gout, atherosclerosis, skin cancer (recently removed - no other treatment), history of foot pain. Denies diabetes, long term steroid use, diagnosis of osteopenia or osteoporosis, latex allergy.     Limitations  Walking;House hold activities;Standing;Other (comment)   gardening, housework, cooking   Currently in Pain?  Yes    Pain Score  5     Pain Location  Neck    Pain Orientation  Upper;Posterior    Pain Descriptors / Indicators  Discomfort    Pain Type  Chronic pain    Pain  Radiating Towards  low back minimally bothering her upon arrival.     Pain Onset  More than a month ago    Pain Frequency  Constant         TREATMENT: Pt denies history of neck surgery Pt denies sensitivity to latex  Therapeutic exercise:to centralize symptoms and improve ROM and strength required for successful completion of functional activities.  Treadmill level up to 0.7 mph with CGA and gait belt for safety and 0% grade. For improved lower extremity mobility, muscular endurance, and weightbearing activity tolerance; and to induce the analgesic effect of aerobic exercise, stimulate improved joint nutrition, and prepare body  structures and systems for following interventions. X 5 minutes during subjective exam. constant supervision required and frequent cuing to improve gait for safe ambulation.  - repeated lumbar extension without counter 4 x 15, feels good after. Mild ERP produced during. To reduce back pain and teach exercise to make improve ability to control pain. Cuing for technique.  - Standing rows with scapular retraction for improved postural and shoulder girdle strengthening and mobility. Required instruction for technique and cuing to retract, posteriorly tilt, and depress scapulae. x30 with green theraband. (light resistance to focus on form) - standing bilateral shoulder extension with green theraband anchored overhead. for improved postural and shoulder girdle strengthening and mobility. Required instruction for technique and cuing to retract, posteriorly tilt, and depress scapulae. x10 no handles, x 20 with handles. Required handles due to hand discomfort.  - Standing pallof press (multifidus press) with double red theraband 2x10 each side. Cuing for trunk - seated long arc quad with 5# ankle weights, x10, x15 each side. Cuing for strong quad contraction and education for purpose of exercise. To improve LE strength and provide an better base of support for lumbar spine after pt complained of knee pain after standing for band exercises. Also to provide TF distraction with gravity assist when resting.   HOME EXERCISE PROGRAM Access Code: [email or web address]  URL: [email or web address]  Date: 03/20/2018  Prepared by: Rosita Kea   Exercises   Seated Posture with Lumbar Roll   Standing Lumbar Extension with Counter - 10-15 reps - 1 sets - 1 second hold - 4x daily   Seated Cervical Retraction and Extension - 10-15 reps - 1 second hold - 4x daily   Clamshell - 10-15 reps - 1 second hold - 3 Sets - 2x daily - 7x weekly   Supine Lower Trunk Rotation - 10-15 reps - 1 second hold - 3 Sets - 2x daily - 7x  weekly   Patient response to treatment:  Pt tolerated treatment well. Pt was able to complete all exercises with minimal to no lasting increase in pain or discomfort. She was able to progress trunk and LE strengthening. She complained of bilateral knee pain that was limiting her standing tolerance, so LE strengthening was introduced as well to give her an improved base of support for her low back.  Pt required cuing for proper technique and to facilitate improved neuromuscular control, strength, range of motion, and functional ability.  PT Education - 03/22/18 1504    Education Details  exercise form/purpose. self-management techniques.     Person(s) Educated  Patient    Methods  Explanation;Demonstration;Tactile cues;Verbal cues    Comprehension  Verbalized understanding;Returned demonstration       PT Short Term Goals - 03/16/18 2004      PT SHORT TERM GOAL #1   Title  Be independent with initial home exercise program for  self-management of symptoms.    Baseline  initial HEP provided at IE (03/15/2018)    Time  2    Period  Weeks    Status  New    Target Date  03/30/18        PT Long Term Goals - 03/16/18 2005      PT LONG TERM GOAL #1   Title  Be independent with a long-term home exercise program for self-management of symptoms    Baseline  initial HEP provided at IE (03/15/2018):     Time  6    Period  Weeks    Status  New    Target Date  04/27/18      PT LONG TERM GOAL #2   Title  Demonstrate improved FOTO score by 10 units to demonstrate improvement in overall condition and self-reported functional ability.     Baseline  FOTO = 55 (03/15/2018)    Time  6    Period  Weeks    Status  New    Target Date  04/27/18      PT LONG TERM GOAL #3   Title  Improve bilateral LE  strength to 4+/5 for improved ability to allow patient to complete valued functional tasks such as reaching overhead and quilting with less difficulty.     Baseline  see objective exam (03/15/2018);     Time   6    Period  Weeks    Status  New    Target Date  04/27/18      PT LONG TERM GOAL #4   Title  Have full lumbar AROM with no compensations or increase in pain in all planes except intermittent end range discomfort to allow patient to complete valued activities with less difficulty.     Baseline  see objective exam (03/15/2018);    Time  6    Period  Weeks    Status  New    Target Date  04/27/18      PT LONG TERM GOAL #5   Title  Complete community, work and/or recreational activities without limitation due to current condition.     Baseline   limits her walking, ability to do yardwork, difficulty walking up hills, limites bending, lifting, housework.  (03/15/2018);    Time  6    Period  Weeks    Status  New    Target Date  04/27/18            Plan - 03/22/18 1511    Clinical Impression Statement  Patient has attended 3 treatment sessions this episode of care and is making progress towards goals. Her low back pain had improved by this session and was further improved with extension based exercises. She reports continued improvement with cervical spine pain. She was able to tolerate progressed exercises for trunk and LE strengthening to support long term relief from low back pain. She complains of limitations from knee pain, so LE strengthening exercises were included. Patient is a 83 y.o. female referred to outpatient physical therapy with a medical diagnosis of chronic midline low back pain without sciatica who presents with signs and symptoms consistent with low back pain, decreased trunk and hip strength, stiffness, reduced activity tolerance limiting her ability to perform basic ADLs, IADLs, ambulation, gardening, and usual social and community activities. She would benefit from continued skilled physical therapy to address her impairments and functional limitations and return to PLOF.    Rehab Potential  Good    Clinical Impairments  Affecting Rehab Potential  (+) motivation, history of  improvement with PT (-) chronicity of symptoms, lack of physical conditioning, multiple comorbidities    PT Frequency  2x / week    PT Duration  6 weeks    PT Treatment/Interventions  ADLs/Self Care Home Management;Aquatic Therapy;Cryotherapy;Electrical Stimulation;Moist Heat;Traction;Gait training;Stair training;Functional mobility training;Therapeutic activities;Therapeutic exercise;Balance training;Neuromuscular re-education;Patient/family education;Manual techniques;Passive range of motion;Dry needling;Joint Manipulations;Spinal Manipulations;Other (comment)   joint mobilizations grades I-V, pain neuroscience education   PT Next Visit Plan  assess response to HEP and progress as appropriate. progress exercises and interventions as appropriate, focusing on specific exercises for directional preference and progressive trunk, postural, LE, and functional strengthening as tolerated.    PT Home Exercise Plan  Medbridge Access Code: [email or web address]     Consulted and Agree with Plan of Care  Patient       Patient will benefit from skilled therapeutic intervention in order to improve the following deficits and impairments:  Decreased activity tolerance, Decreased endurance, Decreased range of motion, Decreased strength, Hypomobility, Impaired perceived functional ability, Pain, Decreased mobility, Difficulty walking, Impaired flexibility, Obesity, Other (comment)(decreased knowledge of condition and optimal self-management techniques, lack of physical activity levels reccomended for general health)  Visit Diagnosis: Chronic bilateral low back pain, unspecified whether sciatica present  Difficulty in walking, not elsewhere classified  Muscle weakness (generalized)     Problem List Patient Active Problem List   Diagnosis Date Noted  . Low back pain 03/15/2018  . CKD (chronic kidney disease) stage 3, GFR 30-59 ml/min (HCC) 03/15/2018  . Intermittent right lower quadrant abdominal pain 07/31/2017  .  Atypical chest pain 01/22/2017  . Hilar adenopathy 01/22/2017  . Aortic atherosclerosis ([place]) 01/22/2017  . Post-menopausal atrophic vaginitis 01/18/2016  . Encounter for preventive health examination 01/18/2016  . Dysuria 04/10/2015  . Colon cancer screening 01/16/2015  . Numbness and tingling of right arm 01/16/2015  . Obesity 12/31/2012  . Gluten-sensitive enteropathy 12/29/2012  . Obsessive compulsive disorder 12/29/2012  . Cystic teratoma of right ovary 11/23/2012  . Gout 04/13/2012  . Right knee DJD 04/03/2012  . Medicare annual wellness visit, subsequent 11/26/2011  . Hyperlipidemia LDL goal <130 05/25/2011  . Hypertension 05/25/2011    Jaianna Nordmann, PT, DPT 03/22/2018, 3:13 PM  [facility] [address], [place], [address] Phone: [phone]   Fax:  [phone]  Name: TYKISHA AREOLA MRN: [id number] Date of Birth: 13-Aug-1935

## 2018-03-27 ENCOUNTER — Ambulatory Visit: Payer: Medicare Other | Admitting: Physical Therapy

## 2018-03-27 VITALS — BP 150/60 | HR 70

## 2018-03-27 DIAGNOSIS — M545 Low back pain: Secondary | ICD-10-CM | POA: Diagnosis not present

## 2018-03-27 DIAGNOSIS — R262 Difficulty in walking, not elsewhere classified: Secondary | ICD-10-CM | POA: Diagnosis not present

## 2018-03-27 DIAGNOSIS — M542 Cervicalgia: Secondary | ICD-10-CM | POA: Diagnosis not present

## 2018-03-27 DIAGNOSIS — M6281 Muscle weakness (generalized): Secondary | ICD-10-CM

## 2018-03-27 DIAGNOSIS — M62838 Other muscle spasm: Secondary | ICD-10-CM | POA: Diagnosis not present

## 2018-03-27 DIAGNOSIS — G8929 Other chronic pain: Secondary | ICD-10-CM

## 2018-03-27 NOTE — Therapy (Signed)
[facility] [address] [place], [address] Phone: [phone]   Fax:  [phone]  Physical Therapy Treatment  Patient Details  Name: Jamie Anderson MRN: [id number] Date of Birth: 10-Oct-1935 Referring Provider (PT): Crecencio Mc, MD   Encounter Date: 03/27/2018  PT End of Session - 03/27/18 1445    Visit Number  4    Number of Visits  12    Date for PT Re-Evaluation  04/27/18    Authorization Type  [employer] Medicare reporting period from 03/15/2018    Authorization Time Period  Current auth period: 03/15/2018 - 04/27/2018 (latest PN: 03/15/2018)    Authorization - Visit Number  4    Authorization - Number of Visits  10    PT Start Time  1440    PT Stop Time  1520    PT Time Calculation (min)  40 min    Equipment Utilized During Treatment  Gait belt    Activity Tolerance  Patient tolerated treatment well;Patient limited by pain;Patient limited by fatigue    Behavior During Therapy  [facility] for tasks assessed/performed       Past Medical History:  Diagnosis Date  . Granuloma annulare   . Hyperlipidemia   . Hypertension   . Menopause   . Plantar fasciitis of right foot 12/24/2014    Past Surgical History:  Procedure Laterality Date  . APPENDECTOMY    . DILATION AND CURETTAGE OF UTERUS    . RIGHT OOPHORECTOMY Right feb 2014   [name]  . [facility]  2010  . VENOUS ABLATION     bilateral    Vitals:   03/27/18 1440  BP: (!) 150/60  Pulse: 70    Subjective Assessment - 03/27/18 1440    Subjective  Patient reports she has had a headache over the weekend after being out in the cold wind. It varies in intensity and is on the rigth side of her head. Feels like "drilling" at times or like it is coming on. She did not notice it today until she got to the clinic. She states her back is feeling pretty good with no pain upon arrival. Feels a bit weak when getting up. Patient reports muscle soreness and achiness in  the arms and legs consistent with DOMS following last session.. Moving her head makes her headache worse.  She has been performing her HEP at home and does not think the neck exericses are making her headache worse.     Pertinent History  Patient is a 83 y.o. female who presents to outpatient physical therapy with a referral for medical diagnosis chronic midline low back pain without sciatica. This patient's chief complaints consist of pain, stiffness, and decreased activity tolerance, leading to the following functional deficits: difficulty with standing activities, getting up and down, walking, gardening, housework, and social activities. Relevant past medical history and comorbidities include previous lumbar surgery for a disc (not a fusion), hypertension, hilar adenopathy, bilateral knee arthritis (left gives way), obesity, gout, atherosclerosis, skin cancer (recently removed - no other treatment), history of foot pain. Denies diabetes, long term steroid use, diagnosis of osteopenia or osteoporosis, latex allergy.     Limitations  Walking;House hold activities;Standing;Other (comment)   gardening, housework, cooking   Currently in Pain?  Yes    Pain Score  2     Pain Location  Head    Pain Orientation  Right    Pain Descriptors / Indicators  Aching  drilling   Pain Type  Acute pain    Pain Onset  More than a month ago         PT Education - 03/27/18 1444    Education Details   Exercise purpose/form. Self management techniques. Education on diagnosis, prognosis, POC, anatomy and physiology of current condition     Person(s) Educated  Patient    Methods  Explanation;Demonstration;Tactile cues;Verbal cues    Comprehension  Verbalized understanding;Returned demonstration      TREATMENT: Pt denies history of neck surgery Pt denies sensitivity to latex  Therapeutic exercise:to centralize symptoms and improve ROM and strength required for successful completion of functional  activities. - blood pressure check after 2 + min of quiet resting to determine safety of exercise today see results above). Determined safe to continue  - repeated lumbar extension without counterx 20, feels good after. Mild ERP produced during. To reduce back pain and teach exercise to make improve ability to control pain. Cuing for technique. Modified for neck pain today.  -Standing rows with scapular retraction for improved postural and shoulder girdle strengthening and mobility. Required instruction for technique and cuing to retract, posteriorly tilt, and depress scapulae.x30 with green theraband.(light resistance to focus on form) - standing bilateral shoulder extension with green theraband anchored overhead. for improved postural and shoulder girdle strengthening and mobility. Required instruction for technique and cuing to retract, posteriorly tilt, and depress scapulae.with handles, x 30 with handles. Required handles due to hand discomfort.   - Standing pallof press (multifidus press) with double green theraband 2x10 each side. Cuing for trunk - seated long arc quad with 7.5# ankle weights, 3x10 each side. Cuing for strong quad contraction and education for purpose of exercise. To improve LE strength and provide an better base of support for lumbar spine after pt complained of knee pain after standing for band exercises. Also to provide TF distraction with gravity assist when resting.   HOME EXERCISE PROGRAM Access Code: [email or web address]  URL: [email or web address]  Date: 03/27/2018  Prepared by: Rosita Kea   Exercises  Seated Posture with Lumbar Roll  Standing Lumbar Extension with Counter - 10-15 reps - 1 sets - 1 second hold - 4x daily  Seated Cervical Retraction and Extension - 10-15 reps - 1 second hold - 4x daily  Clamshell - 10-15 reps - 1 second hold - 3 Sets - 2x daily - 7x weekly  Supine Lower Trunk Rotation - 10-15 reps - 1 second hold - 3 Sets - 2x daily - 7x  weekly  Standing Bilateral Low Shoulder Row with Anchored Resistance - 10-15 reps - 1 second hold - 3 Sets - 1x daily - 3x weekly   Patient response to treatment:  Pt tolerated treatment fair. She presented with a right sided headache today that has been intermittent for the past few days. It limited her slightly in performing lumbar exercises and was more uncomfortable that lumbar pain. Her blood pressure was within safe range and usual for her stated norm. She did have increased pain when she rotating head to the right. Pt was able to complete all exercises with minimal to no lasting increase in overall pain or discomfort. Exercises were minimally progressed due to report of significant soreness following last treatment session.  LE strengthening was cuntinued to address LE pain and give her an improved base of support for her low back. Pt required cuing for proper technique and to facilitate improved neuromuscular control, strength, range of motion, and functional ability.  PT Short Term Goals - 03/27/18 1519      PT SHORT TERM GOAL #1   Title  Be independent with initial home exercise program for self-management of symptoms.    Baseline  initial HEP provided at IE (03/15/2018)    Time  2    Period  Weeks    Status  Achieved    Target Date  03/30/18        PT Long Term Goals - 03/16/18 2005      PT LONG TERM GOAL #1   Title  Be independent with a long-term home exercise program for self-management of symptoms    Baseline  initial HEP provided at IE (03/15/2018):     Time  6    Period  Weeks    Status  New    Target Date  04/27/18      PT LONG TERM GOAL #2   Title  Demonstrate improved FOTO score by 10 units to demonstrate improvement in overall condition and self-reported functional ability.     Baseline  FOTO = 55 (03/15/2018)    Time  6    Period  Weeks    Status  New    Target Date  04/27/18      PT LONG TERM GOAL #3   Title  Improve bilateral LE  strength to 4+/5 for  improved ability to allow patient to complete valued functional tasks such as reaching overhead and quilting with less difficulty.     Baseline  see objective exam (03/15/2018);     Time  6    Period  Weeks    Status  New    Target Date  04/27/18      PT LONG TERM GOAL #4   Title  Have full lumbar AROM with no compensations or increase in pain in all planes except intermittent end range discomfort to allow patient to complete valued activities with less difficulty.     Baseline  see objective exam (03/15/2018);    Time  6    Period  Weeks    Status  New    Target Date  04/27/18      PT LONG TERM GOAL #5   Title  Complete community, work and/or recreational activities without limitation due to current condition.     Baseline   limits her walking, ability to do yardwork, difficulty walking up hills, limites bending, lifting, housework.  (03/15/2018);    Time  6    Period  Weeks    Status  New    Target Date  04/27/18            Plan - 03/27/18 1505    Clinical Impression Statement  Patient has attended 4 treatment sessions this episode of care and is making progress towards goals related to low back pain. Her low back pain has been steadily improving with extension based exercises. She reports worsening of the cervical spine pain today and headache that limited her ability to participate in exercises for lumbar spine. She may benefit from further medical evaluation and PT referral for cervical spine as it is not currently included in her PT referral. . She continues to complain of limitations from knee pain, so LE strengthening exercises were continued. Patient is a 83 y.o. female referred to outpatient physical therapy with a medical diagnosis of chronic midline low back pain without sciatica who presents with signs and symptoms consistent with low back pain, decreased trunk and hip strength, stiffness, reduced activity  tolerance limiting her ability to perform basic ADLs, IADLs, ambulation,  gardening, and usual social and community activities. She would benefit from continued skilled physical therapy to address her impairments and functional limitations and return to PLOF.    Rehab Potential  Good    Clinical Impairments Affecting Rehab Potential  (+) motivation, history of improvement with PT (-) chronicity of symptoms, lack of physical conditioning, multiple comorbidities    PT Frequency  2x / week    PT Duration  6 weeks    PT Treatment/Interventions  ADLs/Self Care Home Management;Aquatic Therapy;Cryotherapy;Electrical Stimulation;Moist Heat;Traction;Gait training;Stair training;Functional mobility training;Therapeutic activities;Therapeutic exercise;Balance training;Neuromuscular re-education;Patient/family education;Manual techniques;Passive range of motion;Dry needling;Joint Manipulations;Spinal Manipulations;Other (comment)   joint mobilizations grades I-V, pain neuroscience education   PT Next Visit Plan  assess response to HEP and progress as appropriate. progress exercises and interventions as appropriate, focusing on specific exercises for directional preference and progressive trunk, postural, LE, and functional strengthening as tolerated.    PT Home Exercise Plan  Medbridge Access Code: [email or web address]     Recommended Other Services  PT referral for cervical spine    Consulted and Agree with Plan of Care  Patient       Patient will benefit from skilled therapeutic intervention in order to improve the following deficits and impairments:  Decreased activity tolerance, Decreased endurance, Decreased range of motion, Decreased strength, Hypomobility, Impaired perceived functional ability, Pain, Decreased mobility, Difficulty walking, Impaired flexibility, Obesity, Other (comment)(decreased knowledge of condition and optimal self-management techniques, lack of physical activity levels reccomended for general health)  Visit Diagnosis: Chronic bilateral low back pain, unspecified  whether sciatica present  Difficulty in walking, not elsewhere classified  Muscle weakness (generalized)     Problem List Patient Active Problem List   Diagnosis Date Noted  . Low back pain 03/15/2018  . CKD (chronic kidney disease) stage 3, GFR 30-59 ml/min (HCC) 03/15/2018  . Intermittent right lower quadrant abdominal pain 07/31/2017  . Atypical chest pain 01/22/2017  . Hilar adenopathy 01/22/2017  . Aortic atherosclerosis ([facility]) 01/22/2017  . Post-menopausal atrophic vaginitis 01/18/2016  . Encounter for preventive health examination 01/18/2016  . Dysuria 04/10/2015  . Colon cancer screening 01/16/2015  . Numbness and tingling of right arm 01/16/2015  . Obesity 12/31/2012  . Gluten-sensitive enteropathy 12/29/2012  . Obsessive compulsive disorder 12/29/2012  . Cystic teratoma of right ovary 11/23/2012  . Gout 04/13/2012  . Right knee DJD 04/03/2012  . Medicare annual wellness visit, subsequent 11/26/2011  . Hyperlipidemia LDL goal <130 05/25/2011  . Hypertension 05/25/2011    Ketrina Nordmann, PT, DPT 03/27/2018, 3:39 PM  [facility] [address], [place], [address] Phone: [phone]   Fax:  [phone]  Name: TYANNAH SANE MRN: [id number] Date of Birth: 06-04-1935

## 2018-03-28 ENCOUNTER — Telehealth: Payer: Self-pay

## 2018-03-28 DIAGNOSIS — G8929 Other chronic pain: Secondary | ICD-10-CM

## 2018-03-28 DIAGNOSIS — M542 Cervicalgia: Principal | ICD-10-CM

## 2018-03-28 NOTE — Telephone Encounter (Signed)
Copied from [place]. Topic: General - Other >> Mar 28, 2018 11:55 AM Keene Breath wrote: Reason for CRM: Patient called to inform the doctor that her PT said that she would need authorization from the doctor to do therapy on the patient's neck.  Her current referral does not authorize her to do the therapy on the neck.  Patient stated she is still having pain in the neck.  Please advise and call patient back at [phone]

## 2018-03-29 ENCOUNTER — Ambulatory Visit: Payer: Medicare Other | Admitting: Physical Therapy

## 2018-03-29 DIAGNOSIS — G8929 Other chronic pain: Secondary | ICD-10-CM

## 2018-03-29 DIAGNOSIS — M545 Low back pain, unspecified: Secondary | ICD-10-CM

## 2018-03-29 DIAGNOSIS — M62838 Other muscle spasm: Secondary | ICD-10-CM

## 2018-03-29 DIAGNOSIS — R262 Difficulty in walking, not elsewhere classified: Secondary | ICD-10-CM

## 2018-03-29 DIAGNOSIS — M542 Cervicalgia: Secondary | ICD-10-CM

## 2018-03-29 DIAGNOSIS — M6281 Muscle weakness (generalized): Secondary | ICD-10-CM | POA: Diagnosis not present

## 2018-03-29 NOTE — Telephone Encounter (Signed)
Pt is needing referral for neck pain.

## 2018-03-29 NOTE — Telephone Encounter (Signed)
Detailed message left for patient. Advised to call with questions

## 2018-03-29 NOTE — Therapy (Signed)
[facility] [address], [place], [address] Phone: [phone]   Fax:  [phone]  Physical Therapy Re-Evaluation, Re-Certification, and Treatment Reason for Re-Evaluation: received order from referring doctor to add evaluation and treatment cervical spine to POC Reporting period: 03/15/2018 - 03/29/2018   Patient Details  Name: Jamie Anderson MRN: [id number] Date of Birth: 03-23-1935 Referring Provider (PT): Crecencio Mc, MD   Encounter Date: 03/29/2018  PT End of Session - 03/30/18 1950    Visit Number  5    Number of Visits  16    Date for PT Re-Evaluation  05/11/18    Authorization Type  [employer] Medicare reporting period from 03/15/2018    Authorization Time Period  Current auth period: 03/29/2018 - 05/11/2018 (latest PN: re-eval 03/30/2018)    Authorization - Visit Number  1    Authorization - Number of Visits  10    PT Start Time  1430    PT Stop Time  1510    PT Time Calculation (min)  40 min    Equipment Utilized During Treatment  Gait belt    Activity Tolerance  Patient tolerated treatment well;Patient limited by pain;Patient limited by fatigue    Behavior During Therapy  [facility] for tasks assessed/performed       Past Medical History:  Diagnosis Date  . Granuloma annulare   . Hyperlipidemia   . Hypertension   . Menopause   . Plantar fasciitis of right foot 12/24/2014    Past Surgical History:  Procedure Laterality Date  . APPENDECTOMY    . DILATION AND CURETTAGE OF UTERUS    . RIGHT OOPHORECTOMY Right feb 2014   [name]  . [place]  2010  . VENOUS ABLATION     bilateral    There were no vitals filed for this visit.  Subjective Assessment - 03/30/18 1943    Subjective  Patient reports her back is still bothering her mildly but her doctor has added her cervical spine to her referral and she would like to focus on this today. Patient reports her neck started hurting a month or two ago with  insidious onset. She states last year she had paresthesia in the right arm that got better with chiropractic care. She doesn't remember having neck pain at that point. She is concerned about financial limitations and would like to know if she can do any of this at the gym independently. She [place] close to [employer].      Pertinent History   Patient is a 83 y.o. female who presents to outpatient physical therapy with a referral for medical diagnosis chronic midline low back pain without sciatica; neck pain, chronic. This patient's chief complaints consist of pain, stiffness, and decreased activity tolerance, leading to the following functional deficits: difficulty with standing activities, getting up and down, driving, moving head, walking, gardening, housework, and social activities. Relevant past medical history and comorbidities include previous lumbar surgery for a disc (not a fusion), hypertension, hilar adenopathy, bilateral knee arthritis (left gives way), obesity, gout, atherosclerosis, skin cancer (recently removed - no other treatment), history of foot pain. Denies diabetes, long term steroid use, diagnosis of osteopenia or osteoporosis, latex allergy.    Limitations  Walking;House hold activities;Standing;Other (comment);Sitting;Reading;Lifting   gardening, housework, cooking   Currently in Pain?  Yes    Pain Descriptors / Indicators  Dull    Pain Type  Chronic pain    Pain Radiating Towards  --  Pain Onset  More than a month ago    Pain Frequency  Intermittent    Aggravating Factors   standing, getting up and dnow, doing too much activity such as gardening, lifting, carrying, cooking    Pain Relieving Factors  sitting, medication, heat, ice    Effect of Pain on Daily Activities  Limits ability to do valued activities such as yardwork, gardening, walking, walking up hills, community participation, home decoration and housework, cooking.    Multiple Pain Sites  Yes    Pain Score  1     Pain Location  Neck    Pain Orientation  Right;Left;Lower;Posterior   Location: bilateral lower cervical spine, worse on L compared to R   Pain Descriptors / Indicators  Tightness   Quality: popping and cracking, tight, pulling; Paresthesia: denies paresthesia in hands or arms. She did have some in the right arm last year.    Pain Radiating Towards  left shoulder    Pain Onset  More than a month ago    Pain Frequency  Intermittent    Aggravating Factors   moving head,  nodding up and down and rotating side to side    Pain Relieving Factors  not moving, ice and heat, tylenol doesn't help neck.     Effect of Pain on Daily Activities  difficult to complete usual ADLs, IADLs, housework, community responsibilities and participation. difficulty turning head and checking blind spot while driving, difficulty sleeping, makes it hard complete usual activities due to pain when turning head.        OBJECTIVE: OBSERVATION/INSPECTION: Patient presents with forward head posture and rounded shoulders. Obesity. Patient presents with flattened lumbar lordosis  NEUROLOGICAL: Dermatomes: C3 and C4 slighly more feeling on right vs left. Otherwise UE dermatomes equal and intact to light touch. pt with difficulty telling if they feel the same or different on BLE especially L3, L4, and S2 left possibly diminished to light touch. Myotomes: BUE apparently WNL; BLE WNL except 3+/5 at left dorsiflexion Reflexes:   Quadriceps reflex (L4): R = 0, L = 2+.  Achilles reflex (S1): R = 0, L = 0. Upper Motor Neuron Screen: Hoffman's, and Clonus (ankle) negative bilaterally.   SPINE MOTION Cervical Spine AROM:  *Indicates pain - Flexion: = 65 - Extension: = 25. - Rotation: R= 30, L = 30 (pain at bilateral sub-occipital region). - Side Flexion: R= 15, L = 10.  Lumbar AROM:  *Indicates pain  Flexion: = mid shins, increased pain.  Extension: = 25% did not increase back pain.  Rotation: R = 75 right pain, L =  50%.  Side Flexion: R = 75, L = 50.  PERIPHERAL JOINT MOTION (AROM/PROM in degrees):  *Indicates pain - R shoulder grossly 75% limited - L shoulder grossly WFL  BLE grossly WFL  STRENGTH:  *Indicates pain Shoulder  - Flexion: R = 4/5, L = 4/5.  - Abduction: R = 4+/5, L = 4+/5. - External rotation: R = 4/5, L = 4+/5. - Internal rotation: R = 4+/5, L =4+ /5. Elbow - Flexion: R = 4+/5, L = 4+/5. - Extension: R = 3+/5, L = 4+/5. - Grip strength  = WFL R stronger than left (right handed).   Hip         Flexion: R = 4/5, L = 4/5.  Extension: R = 4+/5, L = 4+/5.  Abduction: R = 4/5, L = 4/5. Knee  Ext: R = 5/5, L = 5/5.  Flex: R = 4/5,  L = 4/5. Ankle (seated position)  Dorsiflexion: R = 5/5, L = 3+/5. Able to heel and toe walk with BUE  REPEATED MOTIONS TESTING: - repeated cervical spine retraction x 10. during = pulling at bilateral mastoid region; after = no worse - repeated cervical spine retraction to extension x 10. during = pulling at bilateral mastoid region; after = no worse    SPECIAL TESTS: Cervical axial compression: negative Cervical axial distraction: decreased local pain Spurling's: positive for local ipsilateral neck pain bilaterally. Straight leg raise (SLR): B = positive in hamstring, sensitizing maneuver. FABER: R = + back, L = + back.  ACCESSORY MOTION:  - Tender and hypomobile to CPA, UPA bilaterally, and upglides at the cervical spine and upper thoracic spine.  - tender and hypomobile to CPA throughout lumbar and thoracic spine  PALPATION: - TTP over posterior cervical spine musculature, especially at suboccipitals bilaterally and left distal upper trap  FUNCTIONAL MOBILITY:  Bed mobility: rolling and supine <> sit = I.  Transfers: sit <> stand I with pain  Gait: ambulates I household and short community ambulation.   Stairs: not tested      TREATMENT: Pt denies history of neck surgery Pt denies sensitivity to  latex  Therapeutic exercise:to centralize symptoms and improve ROM and strength required for successful completion of functional activities. - repeated lumbar extensionwithout counterx 20, feels good after. Mild ERP produced during. To reduce back pain and teach exercise to make improve ability to control pain. Cuing for technique. Modified for neck pain today.  -Standing rows with scapular retraction for improved postural and shoulder girdle strengthening and mobility. Required instruction for technique and cuing to retract, posteriorly tilt, and depress scapulae.x30 withgreentheraband.(light resistance to focus on form)  Manual therapy: to reduce pain and tissue tension, improve range of motion, neuromodulation, in order to promote improved ability to complete functional activities. - supine cervical spine joint mobilizations grades II-III for pain relief and to improve joint mobility: CPA, upglides bilaterally.  With gentle distraction as tolerated.  - supine cervical manual distraction, 5-10 seconds x 5 to decrease pain. - cervical spine STM to posterior neck musculature and bilateral SCM to relax muscles and decrease pain.  - Gentle rotation PROM of cervical spine with overpressure as tolerated by pt to increase ROM and decrease pain.    HOME EXERCISE PROGRAM Access Code: [email or web address]  URL: [email or web address]  Date: 03/27/2018  Prepared by: Rosita Kea   Exercises   Seated Posture with Lumbar Roll   Standing Lumbar Extension with Counter - 10-15 reps - 1 sets - 1 second hold - 4x daily   Seated Cervical Retraction and Extension - 10-15 reps - 1 second hold - 4x daily   Clamshell - 10-15 reps - 1 second hold - 3 Sets - 2x daily - 7x weekly   Supine Lower Trunk Rotation - 10-15 reps - 1 second hold - 3 Sets - 2x daily - 7x weekly   Standing Bilateral Low Shoulder Row with Anchored Resistance - 10-15 reps - 1 second hold - 3 Sets - 1x daily - 3x weekly    Patient response to treatment:  Pt tolerated treatment well. She reported significant decrease in neck pain following manual therapy.Pt was able to complete all exercises with minimal to no lasting increase in overall pain or discomfort.Exercise were not progressed due to re-evaluation today for cervical spine being added to MD order. Pt required cuing for proper technique and to facilitate improved neuromuscular control, strength, range of  motion, and functional ability.    PT Education - 03/30/18 1950    Education Details  - Education on HEP including handout - Education on diagnosis, prognosis, POC, anatomy and physiology of current condition. purpose and form correction for excercie. self-management advice.    Person(s) Educated  Patient    Methods  Explanation;Demonstration;Tactile cues;Verbal cues    Comprehension  Verbalized understanding;Returned demonstration       PT Short Term Goals - 03/27/18 1519      PT SHORT TERM GOAL #1   Title  Be independent with initial home exercise program for self-management of symptoms.    Baseline  initial HEP provided at IE (03/15/2018)    Time  2    Period  Weeks    Status  Achieved    Target Date  03/30/18        PT Long Term Goals - 03/30/18 2011      PT LONG TERM GOAL #1   Title  Be independent with a long-term home exercise program for self-management of symptoms    Baseline  initial HEP provided at IE (03/15/2018):     Time  6    Period  Weeks    Status  Partially Met    Target Date  05/11/18      PT LONG TERM GOAL #2   Title  Demonstrate improved FOTO score by 10 units to demonstrate improvement in overall condition and self-reported functional ability.     Baseline  FOTO = 55 (03/15/2018)    Time  6    Period  Weeks    Status  On-going    Target Date  05/11/18      PT LONG TERM GOAL #3   Title  Improve bilateral LE  strength to 4+/5 for improved ability to allow patient to complete valued functional tasks such as reaching  overhead and quilting with less difficulty.     Baseline  see objective exam (03/15/2018);     Time  6    Period  Weeks    Status  New    Target Date  05/11/18      PT LONG TERM GOAL #4   Title  Have full lumbar AROM with no compensations or increase in pain in all planes except intermittent end range discomfort to allow patient to complete valued activities with less difficulty.     Baseline  see objective exam (03/15/2018);    Time  6    Period  Weeks    Status  On-going    Target Date  05/11/18      PT LONG TERM GOAL #5   Title  Complete community, work and/or recreational activities without limitation due to current condition.     Baseline   limits her walking, ability to do yardwork, difficulty walking up hills, limites bending, lifting, housework.  (03/15/2018);    Time  6    Period  Weeks    Status  On-going    Target Date  05/11/18      Additional Long Term Goals   Additional Long Term Goals  Yes      PT LONG TERM GOAL #6   Title  Demonstrate cervical spine rotation AROM to at least 60 degrees both sides with no increase in pain in all planes except intermittent end range discomfort to allow patient to complete valued activities with less difficulty including checking blind spot when driving.      Baseline  see objective exam  Time  6    Period  Weeks    Status  New    Target Date  05/11/18            Plan - 03/30/18 2000    Clinical Impression Statement  Patient has attended 5 treatment sessions this episode of care and is making progress towards goals related to low back pain. She was re-evaluated today to include new referral for chronic neck pain. Patient is a 83 y.o. female  referred to outpatient physical therapy with a medical diagnosis of chronic midline low back pain without sciatica and chronic neck pain who presents with signs and symptoms consistent with low back pain and neck pain, decreased trunk, postural,  and hip strength, stiffness, reduced activity  tolerance limiting her ability to perform basic ADLs, IADLs, ambulation, gardening, driving, and usual social and community activities. She would benefit from skilled physical therapy to address her impairments and functional limitations and return to PLOF.    History and Personal Factors relevant to plan of care:  previous lumbar surgery for a disc (not a fusion), hypertension, hilar adenopathy, bilateral knee arthritis (left gives way), obesity, gout, atherosclerosis, skin cancer (recently removed - no other treatment, history of foot pain, chronic nature of pain, multiple body parts in referral    Clinical Presentation  Evolving    Clinical Presentation due to:  pain in back and neck continues to fluxuate    Clinical Decision Making  Moderate    Rehab Potential  Good    Clinical Impairments Affecting Rehab Potential  (+) motivation, history of improvement with PT (-) chronicity of symptoms, lack of physical conditioning, multiple comorbidities    PT Frequency  2x / week    PT Duration  6 weeks    PT Treatment/Interventions  ADLs/Self Care Home Management;Aquatic Therapy;Cryotherapy;Electrical Stimulation;Moist Heat;Traction;Gait training;Stair training;Functional mobility training;Therapeutic activities;Therapeutic exercise;Balance training;Neuromuscular re-education;Patient/family education;Manual techniques;Passive range of motion;Dry needling;Joint Manipulations;Spinal Manipulations;Other (comment)   joint mobilizations grades I-V, pain neuroscience education   PT Next Visit Plan  assess response to HEP and progress as appropriate. progress exercises and interventions as appropriate, focusing on specific exercises for directional preference and progressive trunk, postural, LE, and functional strengthening as tolerated. Manual for pain relief    PT Home Exercise Plan  Medbridge Access Code: [email or web address]     Consulted and Agree with Plan of Care  Patient       Patient will benefit from skilled  therapeutic intervention in order to improve the following deficits and impairments:  Decreased activity tolerance, Decreased endurance, Decreased range of motion, Decreased strength, Hypomobility, Impaired perceived functional ability, Pain, Decreased mobility, Difficulty walking, Impaired flexibility, Obesity, Other (comment), Postural dysfunction, Impaired UE functional use(decreased knowledge of condition and optimal self-management techniques, lack of physical activity levels reccomended for general health)  Visit Diagnosis: Chronic bilateral low back pain, unspecified whether sciatica present  Cervicalgia  Other muscle spasm  Difficulty in walking, not elsewhere classified  Muscle weakness (generalized)     Problem List Patient Active Problem List   Diagnosis Date Noted  . Low back pain 03/15/2018  . CKD (chronic kidney disease) stage 3, GFR 30-59 ml/min (HCC) 03/15/2018  . Intermittent right lower quadrant abdominal pain 07/31/2017  . Atypical chest pain 01/22/2017  . Hilar adenopathy 01/22/2017  . Aortic atherosclerosis ([place]) 01/22/2017  . Post-menopausal atrophic vaginitis 01/18/2016  . Encounter for preventive health examination 01/18/2016  . Dysuria 04/10/2015  . Colon cancer screening 01/16/2015  . Numbness and tingling of right  arm 01/16/2015  . Obesity 12/31/2012  . Gluten-sensitive enteropathy 12/29/2012  . Obsessive compulsive disorder 12/29/2012  . Cystic teratoma of right ovary 11/23/2012  . Gout 04/13/2012  . Right knee DJD 04/03/2012  . Medicare annual wellness visit, subsequent 11/26/2011  . Hyperlipidemia LDL goal <130 05/25/2011  . Hypertension 05/25/2011    Rickesha Nordmann, PT, DPT 03/30/2018, 8:14 PM  [facility] [address] [place], [address] Phone: [phone]   Fax:  [phone]  Name: SKYLEN DANIELSEN MRN: [id number] Date of Birth: 09-11-1935

## 2018-03-29 NOTE — Telephone Encounter (Signed)
Pt referral for neck pain added

## 2018-03-30 ENCOUNTER — Encounter: Payer: Self-pay | Admitting: Physical Therapy

## 2018-04-03 ENCOUNTER — Encounter: Payer: Self-pay | Admitting: Physical Therapy

## 2018-04-03 ENCOUNTER — Ambulatory Visit: Payer: Medicare Other | Admitting: Physical Therapy

## 2018-04-03 DIAGNOSIS — M545 Low back pain: Principal | ICD-10-CM

## 2018-04-03 DIAGNOSIS — G8929 Other chronic pain: Secondary | ICD-10-CM | POA: Diagnosis not present

## 2018-04-03 DIAGNOSIS — M542 Cervicalgia: Secondary | ICD-10-CM

## 2018-04-03 DIAGNOSIS — M62838 Other muscle spasm: Secondary | ICD-10-CM | POA: Diagnosis not present

## 2018-04-03 DIAGNOSIS — R262 Difficulty in walking, not elsewhere classified: Secondary | ICD-10-CM

## 2018-04-03 DIAGNOSIS — M6281 Muscle weakness (generalized): Secondary | ICD-10-CM | POA: Diagnosis not present

## 2018-04-03 NOTE — Therapy (Signed)
[facility] [address], [place], [address] Phone: [phone]   Fax:  [phone]  Physical Therapy Treatment  Patient Details  Name: Jamie Anderson MRN: [id number] Date of Birth: 03-Oct-1935 Referring Provider (PT): Crecencio Mc, MD   Encounter Date: 04/03/2018  PT End of Session - 04/04/18 2008    Visit Number  6    Number of Visits  16    Date for PT Re-Evaluation  05/11/18    Authorization Type  [employer] Medicare reporting period from 03/15/2018    Authorization Time Period  Current auth period: 03/29/2018 - 05/11/2018 (latest PN: re-eval 03/30/2018)    Authorization - Visit Number  2    Authorization - Number of Visits  10    PT Start Time  1435    PT Stop Time  1515    PT Time Calculation (min)  40 min    Equipment Utilized During Treatment  Gait belt    Activity Tolerance  Patient tolerated treatment well;Patient limited by pain;Patient limited by fatigue    Behavior During Therapy  [facility] for tasks assessed/performed       Past Medical History:  Diagnosis Date  . Granuloma annulare   . Hyperlipidemia   . Hypertension   . Menopause   . Plantar fasciitis of right foot 12/24/2014    Past Surgical History:  Procedure Laterality Date  . APPENDECTOMY    . DILATION AND CURETTAGE OF UTERUS    . RIGHT OOPHORECTOMY Right feb 2014   [name]  . [place]  2010  . VENOUS ABLATION     bilateral    There were no vitals filed for this visit.  Subjective Assessment - 04/03/18 1438    Subjective  Patient report her neck is feeling much better since last treatment session. Her back feels okay when she is "drugged up" with tylenol. It is feeling not too bad right now (5/10) but it was much more prior to taking tylenol this morning. Pt reports her HEP is going well.     Pertinent History   Patient is a 83 y.o. female who presents to outpatient physical therapy with a referral for medical diagnosis chronic  midline low back pain without sciatica; neck pain, chronic. This patient's chief complaints consist of pain, stiffness, and decreased activity tolerance, leading to the following functional deficits: difficulty with standing activities, getting up and down, driving, moving head, walking, gardening, housework, and social activities. Relevant past medical history and comorbidities include previous lumbar surgery for a disc (not a fusion), hypertension, hilar adenopathy, bilateral knee arthritis (left gives way), obesity, gout, atherosclerosis, skin cancer (recently removed - no other treatment), history of foot pain. Denies diabetes, long term steroid use, diagnosis of osteopenia or osteoporosis, latex allergy.    Limitations  Walking;House hold activities;Standing;Other (comment);Sitting;Reading;Lifting   gardening, housework, cooking   Currently in Pain?  Yes    Pain Score  5     Pain Location  Back    Pain Orientation  Left;Lower    Pain Descriptors / Indicators  Dull    Pain Type  Chronic pain    Pain Onset  More than a month ago        TREATMENT: Pt denies history of neck surgery Pt denies sensitivity to latex  Therapeutic exercise:to centralize symptoms and improve ROM and strength required for successful completion of functional activities. - repeated lumbar extensionwithout counterx20, feels good after. Mild ERP produced during. To  reduce back pain and teach exercise to make improve ability to control pain. Cuing for technique. Modified for neck pain today. - seated long arc quad with 7.5# ankle weights, x5 R side before discontinuation due to increased right sided leg pain and "cramping" sensation.  - attempted prone lying to be able to perform lumbar and thoracic CPAs and STM to improve extension tolerance due to history of improvement but not lasting improvement with specific exericses for extension. However, pt began to have cramps in posterior right leg while rotating lumbar  spine to right to get in prone and was unable to lay down. So this was discontinued.  - repeated lumbar extension over counter top 3x 20 with breaks between. Decreased and centralized leg pain and "cramps" - ambulation between sets of repeated extension and other interventions to assess response and allow active resting break.  - Education on HEP including handout  - Education on diagnosis, prognosis, POC, anatomy and physiology of current condition.   Manual therapy:to reduce pain and tissue tension, improve range of motion, neuromodulation, in order to promote improved ability to complete functional activities.  - attemp.   HOME EXERCISE PROGRAM Access Code: [email or web address]  URL: [email or web address]  Date: 03/27/2018  Prepared by: Rosita Kea   Exercises   Seated Posture with Lumbar Roll   Standing Lumbar Extension with Counter - 10-15 reps - 1 sets - 1 second hold - 4x daily   Seated Cervical Retraction and Extension - 10-15 reps - 1 second hold - 4x daily   Clamshell - 10-15 reps - 1 second hold - 3 Sets - 2x daily - 7x weekly   Supine Lower Trunk Rotation - 10-15 reps - 1 second hold - 3 Sets - 2x daily - 7x weekly   Standing Bilateral Low Shoulder Row with Anchored Resistance - 10-15 reps - 1 second hold - 3 Sets - 1x daily - 3x weekly  Patient response to treatment:  Pt tolerated treatmentfair. She had very uncomfortable pain in the right posterior thigh that she reported as 'cramping' after attempting to lie prone that severely limited her ability to stand up and walk. She required extended rest leaning on a sturdy counter and trying to work it out before she felt able and willing to attempt standing lumbar extension. Once she was able to tolerate this the repeated exercises seemed to abolish her leg pain except for a little at her knee. This was repeated until there was a plateau in decreased pain and it was added in frequency to her HEP. "cramping" seems to  be related to lumbar radicular pain at this point. She was unable to complete other exercises due to spending most of the appointment working through her leg pain. She reported significant decrease in neck pain following manual therapy.Pt required cuing for proper technique and to facilitate improved neuromuscular control, strength, range of motion, and functional ability.    PT Education - 04/03/18 1440    Education Details   Education on HEP including handout - Education on diagnosis, prognosis, POC, anatomy and physiology of current condition. purpose and form correction for excercie. self-management advice.    Methods  Explanation;Demonstration;Tactile cues;Verbal cues    Comprehension  Verbalized understanding;Returned demonstration       PT Short Term Goals - 03/27/18 1519      PT SHORT TERM GOAL #1   Title  Be independent with initial home exercise program for self-management of symptoms.    Baseline  initial HEP provided at IE (  03/15/2018)    Time  2    Period  Weeks    Status  Achieved    Target Date  03/30/18        PT Long Term Goals - 03/30/18 2011      PT LONG TERM GOAL #1   Title  Be independent with a long-term home exercise program for self-management of symptoms    Baseline  initial HEP provided at IE (03/15/2018):     Time  6    Period  Weeks    Status  Partially Met    Target Date  05/11/18      PT LONG TERM GOAL #2   Title  Demonstrate improved FOTO score by 10 units to demonstrate improvement in overall condition and self-reported functional ability.     Baseline  FOTO = 55 (03/15/2018)    Time  6    Period  Weeks    Status  On-going    Target Date  05/11/18      PT LONG TERM GOAL #3   Title  Improve bilateral LE  strength to 4+/5 for improved ability to allow patient to complete valued functional tasks such as reaching overhead and quilting with less difficulty.     Baseline  see objective exam (03/15/2018);     Time  6    Period  Weeks    Status  New     Target Date  05/11/18      PT LONG TERM GOAL #4   Title  Have full lumbar AROM with no compensations or increase in pain in all planes except intermittent end range discomfort to allow patient to complete valued activities with less difficulty.     Baseline  see objective exam (03/15/2018);    Time  6    Period  Weeks    Status  On-going    Target Date  05/11/18      PT LONG TERM GOAL #5   Title  Complete community, work and/or recreational activities without limitation due to current condition.     Baseline   limits her walking, ability to do yardwork, difficulty walking up hills, limites bending, lifting, housework.  (03/15/2018);    Time  6    Period  Weeks    Status  On-going    Target Date  05/11/18      Additional Long Term Goals   Additional Long Term Goals  Yes      PT LONG TERM GOAL #6   Title  Demonstrate cervical spine rotation AROM to at least 60 degrees both sides with no increase in pain in all planes except intermittent end range discomfort to allow patient to complete valued activities with less difficulty including checking blind spot when driving.      Baseline  see objective exam    Time  6    Period  Weeks    Status  New    Target Date  05/11/18            Plan - 04/04/18 2016    Clinical Impression Statement  Patient has attended 6 treatment sessions this episode of care and is overall making progress towards goals. She had onset of increased leg pain at today's visit that was decreased with extension exercise and seemed more motivated to complete this more frequently at home. It appears the "cramps" she has may be related to lumbar radicular symptoms.  Her neck pain was much better today following manual therapy at last  session.. Patient is a 83 y.o. female referred to outpatient physical therapy with a medical diagnosis of chronic midline low back pain without sciatica and chronic neck pain who presents with signs and symptoms consistent with low back pain and  neck pain, decreased trunk, postural, and hip strength, stiffness, reduced activity tolerance limiting her ability to perform basic ADLs, IADLs, ambulation, gardening, driving, and usual social and community activities. She would benefit from skilled physical therapy to address her impairments and functional limitations and return to PLOF.    Rehab Potential  Good    Clinical Impairments Affecting Rehab Potential  (+) motivation, history of improvement with PT (-) chronicity of symptoms, lack of physical conditioning, multiple comorbidities    PT Frequency  2x / week    PT Duration  6 weeks    PT Treatment/Interventions  ADLs/Self Care Home Management;Aquatic Therapy;Cryotherapy;Electrical Stimulation;Moist Heat;Traction;Gait training;Stair training;Functional mobility training;Therapeutic activities;Therapeutic exercise;Balance training;Neuromuscular re-education;Patient/family education;Manual techniques;Passive range of motion;Dry needling;Joint Manipulations;Spinal Manipulations;Other (comment)   joint mobilizations grades I-V, pain neuroscience education   PT Next Visit Plan  assess response to HEP and progress as appropriate. progress exercises and interventions as appropriate, focusing on specific exercises for directional preference and progressive trunk, postural, LE, and functional strengthening as tolerated. Manual for pain relief    PT Home Exercise Plan  Medbridge Access Code: [email or web address]     Consulted and Agree with Plan of Care  Patient       Patient will benefit from skilled therapeutic intervention in order to improve the following deficits and impairments:  Decreased activity tolerance, Decreased endurance, Decreased range of motion, Decreased strength, Hypomobility, Impaired perceived functional ability, Pain, Decreased mobility, Difficulty walking, Impaired flexibility, Obesity, Other (comment), Postural dysfunction, Impaired UE functional use(decreased knowledge of condition and  optimal self-management techniques, lack of physical activity levels reccomended for general health)  Visit Diagnosis: Chronic bilateral low back pain, unspecified whether sciatica present  Cervicalgia  Other muscle spasm  Difficulty in walking, not elsewhere classified  Muscle weakness (generalized)     Problem List Patient Active Problem List   Diagnosis Date Noted  . Low back pain 03/15/2018  . CKD (chronic kidney disease) stage 3, GFR 30-59 ml/min (HCC) 03/15/2018  . Intermittent right lower quadrant abdominal pain 07/31/2017  . Atypical chest pain 01/22/2017  . Hilar adenopathy 01/22/2017  . Aortic atherosclerosis ([place]) 01/22/2017  . Post-menopausal atrophic vaginitis 01/18/2016  . Encounter for preventive health examination 01/18/2016  . Dysuria 04/10/2015  . Colon cancer screening 01/16/2015  . Numbness and tingling of right arm 01/16/2015  . Obesity 12/31/2012  . Gluten-sensitive enteropathy 12/29/2012  . Obsessive compulsive disorder 12/29/2012  . Cystic teratoma of right ovary 11/23/2012  . Gout 04/13/2012  . Right knee DJD 04/03/2012  . Medicare annual wellness visit, subsequent 11/26/2011  . Hyperlipidemia LDL goal <130 05/25/2011  . Hypertension 05/25/2011    Bebe Nordmann, PT, DPT 04/04/2018, 8:17 PM  [facility] [address], [place], [address] Phone: [phone]   Fax:  [phone]  Name: MECCA GUITRON MRN: [id number] Date of Birth: [birth year]

## 2018-04-05 ENCOUNTER — Ambulatory Visit: Payer: Medicare Other | Admitting: Physical Therapy

## 2018-04-05 ENCOUNTER — Encounter: Payer: Self-pay | Admitting: Physical Therapy

## 2018-04-05 DIAGNOSIS — G8929 Other chronic pain: Secondary | ICD-10-CM

## 2018-04-05 DIAGNOSIS — R262 Difficulty in walking, not elsewhere classified: Secondary | ICD-10-CM | POA: Diagnosis not present

## 2018-04-05 DIAGNOSIS — M6281 Muscle weakness (generalized): Secondary | ICD-10-CM

## 2018-04-05 DIAGNOSIS — M545 Low back pain, unspecified: Secondary | ICD-10-CM

## 2018-04-05 DIAGNOSIS — M62838 Other muscle spasm: Secondary | ICD-10-CM | POA: Diagnosis not present

## 2018-04-05 DIAGNOSIS — M542 Cervicalgia: Secondary | ICD-10-CM | POA: Diagnosis not present

## 2018-04-05 NOTE — Therapy (Signed)
[facility] [address] [place], [address] Phone: [phone]   Fax:  [phone]  Physical Therapy Treatment and Discharge Summary  Reporting period: 03/15/2018 - 04/05/2018  Patient Details  Name: Jamie Anderson MRN: [id number] Date of Birth: [birth year] Referring Provider (PT): Crecencio Mc, MD   Encounter Date: 04/05/2018  PT End of Session - 04/05/18 1443    Visit Number  7    Number of Visits  16    Date for PT Re-Evaluation  05/11/18    Authorization Type  [employer] Medicare reporting period from 03/15/2018    Authorization Time Period  Current auth period: 03/29/2018 - 05/11/2018 (latest PN: re-eval 03/30/2018)    Authorization - Visit Number  3    Authorization - Number of Visits  10    PT Start Time  1440    PT Stop Time  1518    PT Time Calculation (min)  38 min    Equipment Utilized During Treatment  Gait belt    Activity Tolerance  Patient tolerated treatment well;Patient limited by pain;Patient limited by fatigue    Behavior During Therapy  [facility] for tasks assessed/performed       Past Medical History:  Diagnosis Date  . Granuloma annulare   . Hyperlipidemia   . Hypertension   . Menopause   . Plantar fasciitis of right foot 12/24/2014    Past Surgical History:  Procedure Laterality Date  . APPENDECTOMY    . DILATION AND CURETTAGE OF UTERUS    . RIGHT OOPHORECTOMY Right feb 2014   [name]  . [place]  2010  . VENOUS ABLATION     bilateral    There were no vitals filed for this visit.  Subjective Assessment - 04/05/18 1440    Subjective  Pt reports her back is aching but she has not had any episodes of "cramping" in her leg since last session but she has been stretching her back into extension faithfully. She has been doing about 10 at a time 3 times today and several times yesterday. No cramps last night.  She is feeling a bit dizzy today but her neck has felt "not bad." She reports she  will be unable to continue with physical therapy after today's visit due to financial limiatations. She feels that she has benefitted overall from physical therapy and wishes she could stay longer. She would like advice on what activities she can do going forward independently to continue helping her back and neck.     Pertinent History   Patient is a 83 y.o. female who presents to outpatient physical therapy with a referral for medical diagnosis chronic midline low back pain without sciatica; neck pain, chronic. This patient's chief complaints consist of pain, stiffness, and decreased activity tolerance, leading to the following functional deficits: difficulty with standing activities, getting up and down, driving, moving head, walking, gardening, housework, and social activities. Relevant past medical history and comorbidities include previous lumbar surgery for a disc (not a fusion), hypertension, hilar adenopathy, bilateral knee arthritis (left gives way), obesity, gout, atherosclerosis, skin cancer (recently removed - no other treatment), history of foot pain. Denies diabetes, long term steroid use, diagnosis of osteopenia or osteoporosis, latex allergy.    Limitations  Walking;House hold activities;Standing;Other (comment);Sitting;Reading;Lifting   gardening, housework, cooking   Currently in Pain?  Yes    Pain Score  1     Pain Location  Back    Pain Orientation  Left;Lower    Pain Descriptors / Indicators  Aching    Pain Type  Chronic pain    Pain Radiating Towards  also reports neck is doing pretty well and still having knee pain    Pain Onset  More than a month ago         OBJECTIVE:  Functional strength: able to complete sit <> stand x 10 without UE support after cuing to allow her to balance.  Lumbar AROM: 50%    TREATMENT: Pt denies history of neck surgery Pt denies sensitivity to latex  Therapeutic exercise:to centralize symptoms and improve ROM and strength required for  successful completion of functional activities. - Treadmill up to 1.5 mph and 0% grade with standby assistance for safety. For improved lower extremity mobility, muscular endurance, and weightbearing activity tolerance; and to induce the analgesic effect of aerobic exercise, stimulate improved joint nutrition, and prepare body structures and systems for following interventions. X 6 minutes during subjective exam. - repeated lumbar extensionwithout counter3x20, feels good after. Mild ERP produced during. To reduce back pain and teach exercise to make improve ability to control pain. Cuing for technique. Modified for neck pain today.Required manual overpressure to help her reach end range.  - sit to stand from chair without UE support. 2x10 with instructions on form and how to gradually progress independently.  - standing hip abduction with BUE support, 2x10 each side with instructions on how to gradually progress this exercise at home. Gets pain in low back with continued reps but resolved with rest. Discussed meaning of pain, safety, and appropriate response when working to improve activity tolerance.  - Reviewed HEP including handout.  - Education on diagnosis, prognosis, POC, anatomy and physiology of current condition.  - discussed alternate options to stay active and spent time looking up information and providing it to pt for silver sneakers and other community resource to stay active.   HOME EXERCISE PROGRAM Access Code: [email or web address]  URL: [email or web address]  Date: 04/05/2018  Prepared by: Rosita Kea   Exercises  Seated Posture with Lumbar Roll  Standing Lumbar Extension with Counter - 10-15 reps - 1 sets - 1 second hold - 6x daily  Standing Lumbar Extension at [place] - 10-15 reps - 1 sets - 1 second hold - 6x daily  Seated Cervical Retraction and Extension - 10-15 reps - 1 second hold - 4x daily  Sit to Stand without Arm Support - 10-15 reps - 1 second hold -  1-3 Sets - 1x daily - 7x weekly  Standing Hip Abduction with Unilateral Counter Support - 10-15 reps - 1 second hold - 1-3 Sets - 1x daily - 7x weekly  Standing Bilateral Low Shoulder Row with Anchored Resistance - 10-15 reps - 1 second hold - 3 Sets - 1x daily - 3x weekly   Patient response to treatment:  Pt tolerated treatmentwell. She had mild pain in the lumbar region with standing hip abduction that resolved with rest and required extensive time for education about continuing her HEP independently and appropriate progressions and ability to respond to pain appropriately. She continued to report improved symptoms with lumbar extension exercises. Her HEP was updated for long term success and she was provided with community resources for more affordable support for physical activity.Pt required cuing for proper technique and to facilitate improved neuromuscular control, strength, range of motion, and functional ability.    PT Education - 04/05/18 1442    Education Details  Education on HEP including handout -  Education on diagnosis, prognosis, POC, anatomy and physiology of current condition. purpose and form correction for excercie. self-management advice.    Person(s) Educated  Patient    Methods  Explanation;Demonstration;Tactile cues;Verbal cues    Comprehension  Verbalized understanding;Returned demonstration       PT Short Term Goals - 03/27/18 1519      PT SHORT TERM GOAL #1   Title  Be independent with initial home exercise program for self-management of symptoms.    Baseline  initial HEP provided at IE (03/15/2018)    Time  2    Period  Weeks    Status  Achieved    Target Date  03/30/18        PT Long Term Goals - 04/05/18 1716      PT LONG TERM GOAL #1   Title  Be independent with a long-term home exercise program for self-management of symptoms    Baseline  initial HEP provided at IE (03/15/2018):     Time  6    Period  Weeks    Status  Achieved    Target Date   05/11/18      PT LONG TERM GOAL #2   Title  Demonstrate improved FOTO score by 10 units to demonstrate improvement in overall condition and self-reported functional ability.     Baseline  FOTO = 55 (03/15/2018)    Time  6    Period  Weeks    Status  Unable to assess    Target Date  05/11/18      PT LONG TERM GOAL #3   Title  Improve bilateral LE  strength to 4+/5 for improved ability to allow patient to complete valued functional tasks such as reaching overhead and quilting with less difficulty.     Baseline  see objective exam (03/15/2018); able to complete sit <> stand x 10 with no UE support    Time  6    Period  Weeks    Status  Unable to assess    Target Date  05/11/18      PT LONG TERM GOAL #4   Title  Have full lumbar AROM with no compensations or increase in pain in all planes except intermittent end range discomfort to allow patient to complete valued activities with less difficulty.     Baseline  see objective exam (03/15/2018); improved lumbar extension    Time  6    Period  Weeks    Status  Partially Met    Target Date  05/11/18      PT LONG TERM GOAL #5   Title  Complete community, work and/or recreational activities without limitation due to current condition.     Baseline   limits her walking, ability to do yardwork, difficulty walking up hills, limites bending, lifting, housework.  (03/15/2018);    Time  6    Period  Weeks    Status  Partially Met    Target Date  05/11/18      PT LONG TERM GOAL #6   Title  Demonstrate cervical spine rotation AROM to at least 60 degrees both sides with no increase in pain in all planes except intermittent end range discomfort to allow patient to complete valued activities with less difficulty including checking blind spot when driving.      Baseline  see objective exam    Time  6    Period  Weeks    Status  Unable to assess    Target Date  05/11/18            Plan - 04/05/18 1715    Clinical Impression Statement  Patient has  attended 7 treatment sessions this episode of care overall made progress towards goals. Although she has not yet reached most of her goal and continues to have impairment that are limiting her function, she is self-discharging today due to financial concerns. She has improved in her ability to self manage some symptoms and appears to respond to lumbar and cervical spine extension to decrease her back, neck, and leg pain and has not yet successfully achieved the ability to keep her symptoms at bay during functional activities. She would benefit from further skilled physical therapy to address these remaining deficits but is now being discharged due to pt request. She has been provided with education and HEP for independent management of her condition as well as community resources for continued physical activity which will be beneficial.     Rehab Potential  Good    Clinical Impairments Affecting Rehab Potential  (+) motivation, history of improvement with PT (-) chronicity of symptoms, lack of physical conditioning, multiple comorbidities    PT Frequency  2x / week    PT Duration  6 weeks    PT Treatment/Interventions  ADLs/Self Care Home Management;Aquatic Therapy;Cryotherapy;Electrical Stimulation;Moist Heat;Traction;Gait training;Stair training;Functional mobility training;Therapeutic activities;Therapeutic exercise;Balance training;Neuromuscular re-education;Patient/family education;Manual techniques;Passive range of motion;Dry needling;Joint Manipulations;Spinal Manipulations;Other (comment)   joint mobilizations grades I-V, pain neuroscience education   PT Next Visit Plan  Patient is now discharged from skilled physical therapy due to self-discharge based on financial limitations.     PT Home Exercise Plan  Medbridge Access Code: [email or web address]     Consulted and Agree with Plan of Care  Patient       Patient will benefit from skilled therapeutic intervention in order to improve the following deficits  and impairments:  Decreased activity tolerance, Decreased endurance, Decreased range of motion, Decreased strength, Hypomobility, Impaired perceived functional ability, Pain, Decreased mobility, Difficulty walking, Impaired flexibility, Obesity, Other (comment), Postural dysfunction, Impaired UE functional use(decreased knowledge of condition and optimal self-management techniques, lack of physical activity levels reccomended for general health)  Visit Diagnosis: Chronic bilateral low back pain, unspecified whether sciatica present  Cervicalgia  Other muscle spasm  Difficulty in walking, not elsewhere classified  Muscle weakness (generalized)     Problem List Patient Active Problem List   Diagnosis Date Noted  . Low back pain 03/15/2018  . CKD (chronic kidney disease) stage 3, GFR 30-59 ml/min (HCC) 03/15/2018  . Intermittent right lower quadrant abdominal pain 07/31/2017  . Atypical chest pain 01/22/2017  . Hilar adenopathy 01/22/2017  . Aortic atherosclerosis ([place]) 01/22/2017  . Post-menopausal atrophic vaginitis 01/18/2016  . Encounter for preventive health examination 01/18/2016  . Dysuria 04/10/2015  . Colon cancer screening 01/16/2015  . Numbness and tingling of right arm 01/16/2015  . Obesity 12/31/2012  . Gluten-sensitive enteropathy 12/29/2012  . Obsessive compulsive disorder 12/29/2012  . Cystic teratoma of right ovary 11/23/2012  . Gout 04/13/2012  . Right knee DJD 04/03/2012  . Medicare annual wellness visit, subsequent 11/26/2011  . Hyperlipidemia LDL goal <130 05/25/2011  . Hypertension 05/25/2011    Janie Nordmann, PT, DPT 04/05/2018, 5:20 PM  [facility] [address] [place], [address] Phone: [phone]   Fax:  [phone]  Name: Jamie Anderson MRN: [id number] Date of Birth: 1935/04/27

## 2018-04-10 ENCOUNTER — Ambulatory Visit: Payer: Medicare Other | Admitting: Physical Therapy

## 2018-04-12 ENCOUNTER — Ambulatory Visit: Payer: Medicare Other | Admitting: Physical Therapy

## 2018-04-15 ENCOUNTER — Other Ambulatory Visit: Payer: Self-pay | Admitting: Cardiovascular Disease

## 2018-04-15 ENCOUNTER — Other Ambulatory Visit: Payer: Self-pay | Admitting: Internal Medicine

## 2018-04-15 DIAGNOSIS — M25561 Pain in right knee: Secondary | ICD-10-CM

## 2018-04-17 ENCOUNTER — Ambulatory Visit: Payer: Medicare Other | Admitting: Physical Therapy

## 2018-04-17 NOTE — Telephone Encounter (Signed)
Left a message on her voice mail to contact our office for a follow up appointment and discuss Zetia.  Told the patient in order for our office to continue refilling her Zetia, she would need to follow up at least once a year.

## 2018-04-18 NOTE — Telephone Encounter (Signed)
Spoke with Ms. Derosa regarding the Zetia. The patient states, "I'm not having any problems with my heart and I will contact Dr. Derrel Nip to see if she can refill the Zetia." The patient will call our office back if she notice any cardiac problems or if she is unable to get Dr. Derrel Nip to refill the Zetia. She is aware she will need to make a follow up appointment in order for [place] to continue the refill of Zetia.

## 2018-04-19 ENCOUNTER — Encounter: Payer: Medicare Other | Admitting: Physical Therapy

## 2018-04-24 ENCOUNTER — Encounter: Payer: Medicare Other | Admitting: Physical Therapy

## 2018-04-24 ENCOUNTER — Encounter: Payer: Self-pay | Admitting: Cardiovascular Disease

## 2018-04-24 ENCOUNTER — Ambulatory Visit: Payer: Medicare Other | Admitting: Cardiovascular Disease

## 2018-04-24 VITALS — BP 134/60 | HR 67 | Ht 64.0 in | Wt 194.0 lb

## 2018-04-24 DIAGNOSIS — I10 Essential (primary) hypertension: Secondary | ICD-10-CM | POA: Diagnosis not present

## 2018-04-24 MED ORDER — SIMVASTATIN 40 MG PO TABS: 40.0000 mg | ORAL_TABLET | Freq: Every day | ORAL | 1 refills | Status: DC

## 2018-04-24 MED ORDER — EZETIMIBE 10 MG PO TABS: 10.0000 mg | ORAL_TABLET | Freq: Every day | ORAL | 1 refills | Status: DC

## 2018-04-24 MED ORDER — HYDROCHLOROTHIAZIDE 12.5 MG PO CAPS: 12.5000 mg | ORAL_CAPSULE | Freq: Every day | ORAL | 1 refills | Status: DC

## 2018-04-24 MED ORDER — METOPROLOL SUCCINATE ER 25 MG PO TB24: 25.0000 mg | ORAL_TABLET | Freq: Every day | ORAL | 1 refills | Status: DC

## 2018-04-24 MED ORDER — LOSARTAN POTASSIUM 100 MG PO TABS: 100.0000 mg | ORAL_TABLET | Freq: Every day | ORAL | 1 refills | Status: DC

## 2018-04-24 NOTE — Patient Instructions (Addendum)

## 2018-04-24 NOTE — Progress Notes (Signed)
Cardiology Office Note  Date:  04/24/2018   ID:  Jamie Anderson, DOB [birth year], MRN [id number]  PCP:  Crecencio Mc, MD   Chief Complaint  Patient presents with  . other    Needs refills C/o neck and back pain. Meds reviewed verbally with pt.    HPI:  [name] is a pleasant 83 year old woman with past medical history of Hypertension Hyperlipidemia Chronic back pain Brother with CABG Sister with "open heart surg" Who presents for follow-up of her chest pain and cardiac risk factors   INTERVAL HISTORY: The patient reports today for follow up. She has been experiencing back pain since stopping her arthritis medication. Her medication was stopped because of her kidneys. She reports trying to drink enough water, but sometimes forgets to drink water. She continues on HCTZ  She got her flu shot and pneumonia shot this past year. No sick contacts.   No regular exercise program but does stay active Denies any falls  She does have some gait instability  No symptoms concerning for angina  Today's Blood pressure 134/60  Tolerating statin with Zetia  Lab work reviewed with her in detail Total Chol 156/ LDL 68, best number so far HBA1C 5.5 CR 1.03 Glucose 89  Previous CT scan reviewed with her in detail: Coronary calcification noted Moderate aortic atherosclerosis  EKG personally reviewed by myself on todays visit Shows normal sinus rhythm. 67 bpm. Low voltage QRS  OTHER PAST MEDICAL HISTORY REVIEWED BY ME FOR TODAY'S VISIT:   PMH:   has a past medical history of Granuloma annulare, Hyperlipidemia, Hypertension, Menopause, and Plantar fasciitis of right foot (12/24/2014).  PSH:    Past Surgical History:  Procedure Laterality Date  . APPENDECTOMY    . DILATION AND CURETTAGE OF UTERUS    . RIGHT OOPHORECTOMY Right feb 2014   [name]  . [place]  2010  . VENOUS ABLATION     bilateral    Current Outpatient Medications  Medication Sig Dispense Refill   . albuterol (PROVENTIL HFA;VENTOLIN HFA) 108 (90 Base) MCG/ACT inhaler Inhale 1 puff into the lungs every 6 (six) hours as needed for wheezing or shortness of breath.    . allopurinol (ZYLOPRIM) 100 MG tablet TAKE 1 TABLET BY MOUTH  DAILY 90 tablet 1  . ANORO ELLIPTA 62.5-25 MCG/INH AEPB Inhale 1 puff into the lungs daily. (Patient taking differently: Inhale 1 puff into the lungs daily as needed. ) 1 each 6  . Coenzyme Q10 (CO Q 10 PO) Take by mouth daily.    Marland Kitchen conjugated estrogens (PREMARIN) vaginal cream Place 1 Applicatorful vaginally 2 (two) times a week. 42.5 g 12  . ezetimibe (ZETIA) 10 MG tablet Take 1 tablet (10 mg total) by mouth daily. 90 tablet 4  . fluticasone (FLONASE) 50 MCG/ACT nasal spray USE 2 SPRAYS IN EACH  NOSTRIL DAILY 48 g 3  . hydrochlorothiazide (MICROZIDE) 12.5 MG capsule Take 1 capsule (12.5 mg total) by mouth daily. 90 capsule 0  . HYDROcodone-acetaminophen (NORCO/VICODIN) 5-325 MG tablet Take 1 tablet every 6 (six) hours as needed by mouth. 10 tablet 0  . Lactulose 20 GM/30ML SOLN 30 ml every 4 hours until constipation is relieved 236 mL 3  . losartan (COZAAR) 100 MG tablet Take 1 tablet (100 mg total) by mouth daily. 90 tablet 0  . metoprolol succinate (TOPROL-XL) 25 MG 24 hr tablet TAKE 1 TABLET BY MOUTH  DAILY 90 tablet 1  . pantoprazole (PROTONIX) 40 MG tablet TAKE 1  TABLET BY MOUTH  DAILY 90 tablet 1  . sertraline (ZOLOFT) 100 MG tablet TAKE 1 TABLET BY MOUTH  EVERY DAY 90 tablet 1  . simvastatin (ZOCOR) 40 MG tablet TAKE 1 TABLET BY MOUTH AT  BEDTIME 90 tablet 1  . traMADol (ULTRAM) 50 MG tablet Take 1 tablet (50 mg total) by mouth every 8 (eight) hours as needed. 30 tablet 0   No current facility-administered medications for this visit.      Allergies:   Biaxin [clarithromycin]; Corn-containing products; Dust mite extract; Gluten meal; Oat; and Shrimp [shellfish allergy]   Social History:  The patient  reports that she has never smoked. She has never used  smokeless tobacco. She reports that she does not drink alcohol or use drugs.   Family History:   family history includes Cancer in her brother; Early death in her mother; Heart attack in her father; Heart disease in her brother, father, and sister.    Review of Systems: Review of Systems  Constitutional: Negative.   Eyes: Negative.   Respiratory: Negative.   Cardiovascular: Negative.   Gastrointestinal: Negative.   Genitourinary: Negative.   Musculoskeletal: Positive for back pain and neck pain.  Neurological: Negative.   Psychiatric/Behavioral: Negative.   All other systems reviewed and are negative.   PHYSICAL EXAM: VS:  BP 134/60 (BP Location: Left Arm, Patient Position: Sitting, Cuff Size: Normal)   Pulse 67   Ht 5\' 4"  (1.626 m)   Wt 194 lb (88 kg)   BMI 33.30 kg/m  , BMI Body mass index is 33.3 kg/m. Constitutional:  oriented to person, place, and time. No distress.  Obese HENT:  Head: Grossly normal Eyes:  no discharge. No scleral icterus.  Neck: No JVD, no carotid bruits  Cardiovascular: Regular rate and rhythm, no murmurs appreciated Pulmonary/Chest: Clear to auscultation bilaterally, no wheezes or rails Abdominal: Soft.  no distension.  no tenderness.  Musculoskeletal: Normal range of motion Neurological:  normal muscle tone. Coordination normal. No atrophy Skin: Skin warm and dry Psychiatric: normal affect, pleasant   Recent Labs: 01/30/2018: ALT 18; TSH 1.65 03/14/2018: BUN 20; Creatinine, Ser 1.03; Potassium 3.9; Sodium 138    Lipid Panel Lab Results  Component Value Date   CHOL 156 01/30/2018   HDL 75.90 01/30/2018   LDLCALC 68 01/30/2018   TRIG 62.0 01/30/2018    Wt Readings from Last 3 Encounters:  04/24/18 194 lb (88 kg)  03/14/18 195 lb 6.4 oz (88.6 kg)  01/30/18 194 lb 12.8 oz (88.4 kg)     ASSESSMENT AND PLAN:  Atypical chest pain  Plan: EKG 12-Lead Denies any symptoms recently, no further work-up at this time For any worsening  exertional chest pain symptoms could consider stress testing Stable. Continue current aggressive medical management   Aortic atherosclerosis (HCC)  Plan: EKG 12-Lead Moderate aortic atherosclerosis Suggested she stay on her statin, add Zetia 10 mg daily Cholesterol at goal  Essential hypertension Plan: EKG 12-Lead Stable. Continue to monitor. Numbers well-controlled  Hyperlipidemia Plan: Continue on statin and Zetia 10 mg daily,  Goal LDL less than 70 given degree of aortic atherosclerosis and coronary calcification Currently at goal, as discussed with her in detail  Shortness of breath Plan: suspect secondary to deconditioning, obesity  Continue walking Recommended low carbohydrate diet  Disposition:   F/U  PRN  Total encounter time more than 25 minutes Greater than 50% was spent in counseling and coordination of care with the patient  No orders of the defined types  were placed in this encounter.  I, Margit Banda am acting as a scribe for Ida Rogue, M.D., Ph.D.  I have reviewed the above documentation for accuracy and completeness, and I agree with the above.  Signed, Esmond Plants, M.D., Ph.D. 04/24/2018  [place], [place]

## 2018-04-26 ENCOUNTER — Encounter: Payer: Medicare Other | Admitting: Physical Therapy

## 2018-05-01 ENCOUNTER — Encounter: Payer: Medicare Other | Admitting: Physical Therapy

## 2018-05-03 ENCOUNTER — Encounter: Payer: Medicare Other | Admitting: Physical Therapy

## 2018-05-16 ENCOUNTER — Telehealth: Payer: Self-pay | Admitting: Internal Medicine

## 2018-05-16 NOTE — Telephone Encounter (Signed)
Copied from [place] [id number]. Topic: Quick Communication - See Telephone Encounter >> May 16, 2018  3:31 PM [place] Nail, [place] wrote: CRM for notification. See Telephone encounter for: 05/16/18. Patient calling and states that the last time she had seen Dr Derrel Nip she mentioned having back and neck pain and Dr Derrel Nip advised her to go to therapy and exercise. States that she did therapy and exercise and is still having the pain. States that Dr Derrel Nip mentioned possibly doing an MRI. Would like to know what Dr Derrel Nip would like to do?  CB#: [phone]

## 2018-05-17 NOTE — Telephone Encounter (Signed)
Patient was seen by you 03/2018. She has done therapy as instructed. Are you okay with doing MRI or do you prefer her be seen first?

## 2018-05-17 NOTE — Telephone Encounter (Signed)
I will need to see her before I orwer an MRI

## 2018-05-18 NOTE — Telephone Encounter (Signed)
Pt scheduled  

## 2018-05-22 ENCOUNTER — Ambulatory Visit (INDEPENDENT_AMBULATORY_CARE_PROVIDER_SITE_OTHER): Payer: Medicare Other | Admitting: Internal Medicine

## 2018-05-22 ENCOUNTER — Other Ambulatory Visit: Payer: Self-pay

## 2018-05-22 ENCOUNTER — Encounter: Payer: Self-pay | Admitting: Internal Medicine

## 2018-05-22 VITALS — BP 144/76 | HR 75 | Temp 98.2°F | Resp 16

## 2018-05-22 DIAGNOSIS — M5416 Radiculopathy, lumbar region: Secondary | ICD-10-CM

## 2018-05-22 DIAGNOSIS — M5440 Lumbago with sciatica, unspecified side: Secondary | ICD-10-CM | POA: Diagnosis not present

## 2018-05-22 MED ORDER — LOSARTAN POTASSIUM 100 MG PO TABS: 100.0000 mg | ORAL_TABLET | Freq: Every day | ORAL | 1 refills | Status: DC

## 2018-05-22 MED ORDER — HYDROCHLOROTHIAZIDE 12.5 MG PO CAPS: 12.5000 mg | ORAL_CAPSULE | Freq: Every day | ORAL | 1 refills | Status: DC

## 2018-05-22 NOTE — Progress Notes (Signed)
Subjective:  Patient ID: Jamie Anderson, female    DOB: Sep 27, 1935  Age: 83 y.o. MRN: [id number]  CC: The primary encounter diagnosis was Lumbar radiculopathy. A diagnosis of Acute left-sided low back pain with sciatica, sciatica laterality unspecified was also pertinent to this visit.  HPI Jamie Anderson presents for   Follow up on back pain lasting over 6 weeks.   Pain has persisted despite PT. It is aggravated by standing,  Cooking,  Pain has been radiating  occasionally to both inguinal areas and is severe , relieved by sitting down .     Has been taking 1000 mg tylenol twice daily .  meloxicam helping 7.5 mg daily.  Has not tried the tramadol yet.    Has DJD knees bilaterally "bone  on bone" managed with steroid injection by orthopedics Has occas    Outpatient Medications Prior to Visit  Medication Sig Dispense Refill  . albuterol (PROVENTIL HFA;VENTOLIN HFA) 108 (90 Base) MCG/ACT inhaler Inhale 1 puff into the lungs every 6 (six) hours as needed for wheezing or shortness of breath.    . allopurinol (ZYLOPRIM) 100 MG tablet TAKE 1 TABLET BY MOUTH  DAILY 90 tablet 1  . ANORO ELLIPTA 62.5-25 MCG/INH AEPB Inhale 1 puff into the lungs daily. (Patient taking differently: Inhale 1 puff into the lungs daily as needed. ) 1 each 6  . Coenzyme Q10 (CO Q 10 PO) Take by mouth daily.    Marland Kitchen conjugated estrogens (PREMARIN) vaginal cream Place 1 Applicatorful vaginally 2 (two) times a week. 42.5 g 12  . ezetimibe (ZETIA) 10 MG tablet Take 1 tablet (10 mg total) by mouth daily. 90 tablet 1  . fluticasone (FLONASE) 50 MCG/ACT nasal spray USE 2 SPRAYS IN EACH  NOSTRIL DAILY 48 g 3  . Lactulose 20 GM/30ML SOLN 30 ml every 4 hours until constipation is relieved 236 mL 3  . meloxicam (MOBIC) 7.5 MG tablet Take 7.5 mg by mouth daily.    . metoprolol succinate (TOPROL-XL) 25 MG 24 hr tablet Take 1 tablet (25 mg total) by mouth daily. 90 tablet 1  . pantoprazole (PROTONIX) 40 MG tablet TAKE 1 TABLET BY MOUTH   DAILY 90 tablet 1  . sertraline (ZOLOFT) 100 MG tablet TAKE 1 TABLET BY MOUTH  EVERY DAY 90 tablet 1  . simvastatin (ZOCOR) 40 MG tablet Take 1 tablet (40 mg total) by mouth at bedtime. 90 tablet 1  . hydrochlorothiazide (MICROZIDE) 12.5 MG capsule Take 1 capsule (12.5 mg total) by mouth daily. 90 capsule 1  . losartan (COZAAR) 100 MG tablet Take 1 tablet (100 mg total) by mouth daily. 90 tablet 1  . traMADol (ULTRAM) 50 MG tablet Take 1 tablet (50 mg total) by mouth every 8 (eight) hours as needed. (Patient not taking: Reported on 05/22/2018) 30 tablet 0  . HYDROcodone-acetaminophen (NORCO/VICODIN) 5-325 MG tablet Take 1 tablet every 6 (six) hours as needed by mouth. (Patient not taking: Reported on 05/22/2018) 10 tablet 0   No facility-administered medications prior to visit.     Review of Systems;  Patient denies headache, fevers, malaise, unintentional weight loss, skin rash, eye pain, sinus congestion and sinus pain, sore throat, dysphagia,  hemoptysis , cough, dyspnea, wheezing, chest pain, palpitations, orthopnea, edema, abdominal pain, nausea, melena, diarrhea, constipation, flank pain, dysuria, hematuria, urinary  Frequency, nocturia, numbness, tingling, seizures,  Focal weakness, Loss of consciousness,  Tremor, insomnia, depression, anxiety, and suicidal ideation.      Objective:  BP Marland Kitchen)  144/76 (BP Location: Left Arm, Patient Position: Sitting, Cuff Size: Normal)   Pulse 75   Temp 98.2 F (36.8 C) (Oral)   Resp 16   SpO2 97%   BP Readings from Last 3 Encounters:  05/22/18 (!) 144/76  04/24/18 134/60  03/27/18 (!) 150/60    Wt Readings from Last 3 Encounters:  04/24/18 194 lb (88 kg)  03/14/18 195 lb 6.4 oz (88.6 kg)  01/30/18 194 lb 12.8 oz (88.4 kg)    General appearance: alert, cooperative and appears stated age Ears: normal TM's and external ear canals both ears Throat: lips, mucosa, and tongue normal; teeth and gums normal Neck: no adenopathy, no carotid bruit,  supple, symmetrical, trachea midline and thyroid not enlarged, symmetric, no tenderness/mass/nodules Back: symmetric, no curvature. ROM normal. No CVA tenderness. Lungs: clear to auscultation bilaterally Heart: regular rate and rhythm, S1, S2 normal, no murmur, click, rub or gallop Abdomen: soft, non-tender; bowel sounds normal; no masses,  no organomegaly Pulses: 2+ and symmetric Skin: Skin color, texture, turgor normal. No rashes or lesions Lymph nodes: Cervical, supraclavicular, and axillary nodes normal.  Lab Results  Component Value Date   HGBA1C 5.5 01/30/2018   HGBA1C 5.4 01/15/2015    Lab Results  Component Value Date   CREATININE 1.03 03/14/2018   CREATININE 1.17 01/30/2018   CREATININE 0.94 06/06/2017    Lab Results  Component Value Date   WBC 6.0 01/18/2017   HGB 12.6 01/18/2017   HCT 37.5 01/18/2017   PLT 188 01/18/2017   GLUCOSE 89 03/14/2018   CHOL 156 01/30/2018   TRIG 62.0 01/30/2018   HDL 75.90 01/30/2018   LDLDIRECT 59.0 01/30/2018   LDLCALC 68 01/30/2018   ALT 18 01/30/2018   AST 21 01/30/2018   NA 138 03/14/2018   K 3.9 03/14/2018   CL 104 03/14/2018   CREATININE 1.03 03/14/2018   BUN 20 03/14/2018   CO2 29 03/14/2018   TSH 1.65 01/30/2018   INR 0.9 05/29/2012   HGBA1C 5.5 01/30/2018   MICROALBUR <0.7 04/20/2016     Assessment & Plan:   Problem List Items Addressed This Visit    Low back pain    persistent with radiculopathy at the L3 level.  No improvement with PT> ordering MRI  Prn tramadol       Relevant Medications   meloxicam (MOBIC) 7.5 MG tablet    Other Visit Diagnoses    Lumbar radiculopathy    -  Primary   Relevant Orders   MR Lumbar Spine Wo Contrast      I have discontinued Izora Gala L. Ost's HYDROcodone-acetaminophen. I am also having her maintain her conjugated estrogens, Coenzyme Q10 (CO Q 10 PO), albuterol, Anoro Ellipta, Lactulose, fluticasone, allopurinol, traMADol, pantoprazole, sertraline, ezetimibe, metoprolol  succinate, simvastatin, meloxicam, hydrochlorothiazide, and losartan.  Meds ordered this encounter  Medications  . hydrochlorothiazide (MICROZIDE) 12.5 MG capsule    Sig: Take 1 capsule (12.5 mg total) by mouth daily.    Dispense:  90 capsule    Refill:  1    PLEASE DO NOT COMBINE WITH LOSARTAN IN ONE PILL.  KEEP SEPARATE  . losartan (COZAAR) 100 MG tablet    Sig: Take 1 tablet (100 mg total) by mouth daily.    Dispense:  90 tablet    Refill:  1    Medications Discontinued During This Encounter  Medication Reason  . HYDROcodone-acetaminophen (NORCO/VICODIN) 5-325 MG tablet   . hydrochlorothiazide (MICROZIDE) 12.5 MG capsule Reorder  . losartan (COZAAR) 100 MG  tablet Reorder    Follow-up: No follow-ups on file.   Crecencio Mc, MD

## 2018-05-22 NOTE — Patient Instructions (Signed)
I have ordered an MRI of your lumbar spine   I will also make a referral to Dr Loistine Chance at [facility] to consierr an epidural steroid injection    Start taking your losartan at night.  Keep the hctz in the morning

## 2018-05-23 NOTE — Assessment & Plan Note (Addendum)
persistent with radiculopathy at the L3 level.  No improvement with PT> ordering MRI  Prn tramadol

## 2018-05-26 ENCOUNTER — Telehealth: Payer: Self-pay

## 2018-05-26 MED ORDER — ONDANSETRON HCL 4 MG PO TABS: 4.0000 mg | ORAL_TABLET | Freq: Three times a day (TID) | ORAL | 0 refills | Status: DC | PRN

## 2018-05-26 MED ORDER — OSELTAMIVIR PHOSPHATE 75 MG PO CAPS: 75.0000 mg | ORAL_CAPSULE | Freq: Two times a day (BID) | ORAL | 0 refills | Status: DC

## 2018-05-26 NOTE — Addendum Note (Signed)
Addended by: Crecencio Mc on: 05/26/2018 12:45 PM   Modules accepted: Orders

## 2018-05-26 NOTE — Telephone Encounter (Signed)
Copied from [place] [id number]. Topic: General - Inquiry >> May 26, 2018  9:56 AM Virl Axe D wrote: Reason for CRM: Pt's daughter Valetta Fuller stated that her mother woke up feeling weak with a headache, diarrhea and feelings of nausea and wanting to throw up. She does not believe it is Covid 19 related. She would like to know of Dr. Derrel Nip would send in Zofran to pt's pharmacy today. Please reach out to [facility] with questions. [place], [place] [phone] (Phone) [phone] (Fax)

## 2018-05-26 NOTE — Telephone Encounter (Signed)
zofran 4 mg sent to [facility] .  I recommend empiric treatment for Influenza B as well . Since we have had several cases in the last week.   Sending tamiflu

## 2018-05-26 NOTE — Telephone Encounter (Signed)
Spoke with pt's daughter and she stated that the pt has not traveled any where and the only place that the pt has been is to our office a few days ago. Daughter stated that pt woke up this morning with chills, diarrhea, nausea, HA and thinks she has the stomach. The daughter is wanting to know if some zofran could be called in for the pt.

## 2018-05-26 NOTE — Telephone Encounter (Signed)
Spoke with pt's daughter and informed her of the medications that Dr. Derrel Nip has sent in for her mother. Daughter gave a verbal understanding.

## 2018-06-02 ENCOUNTER — Ambulatory Visit: Payer: Medicare Other

## 2018-06-14 ENCOUNTER — Ambulatory Visit: Payer: Medicare Other

## 2018-06-26 ENCOUNTER — Other Ambulatory Visit: Payer: Self-pay

## 2018-06-26 ENCOUNTER — Ambulatory Visit
Admission: RE | Admit: 2018-06-26 | Discharge: 2018-06-26 | Disposition: A | Discharge status: Home / Self Care | Payer: Medicare Other | Source: Ambulatory Visit | Attending: Internal Medicine | Admitting: Internal Medicine

## 2018-06-26 DIAGNOSIS — M545 Low back pain: Secondary | ICD-10-CM | POA: Diagnosis not present

## 2018-06-26 DIAGNOSIS — M5416 Radiculopathy, lumbar region: Secondary | ICD-10-CM | POA: Diagnosis not present

## 2018-07-04 ENCOUNTER — Ambulatory Visit (INDEPENDENT_AMBULATORY_CARE_PROVIDER_SITE_OTHER): Payer: Medicare Other | Admitting: Internal Medicine

## 2018-07-04 ENCOUNTER — Other Ambulatory Visit: Payer: Self-pay

## 2018-07-04 ENCOUNTER — Encounter: Payer: Self-pay | Admitting: Internal Medicine

## 2018-07-04 DIAGNOSIS — I10 Essential (primary) hypertension: Secondary | ICD-10-CM | POA: Diagnosis not present

## 2018-07-04 DIAGNOSIS — M48061 Spinal stenosis, lumbar region without neurogenic claudication: Secondary | ICD-10-CM | POA: Diagnosis not present

## 2018-07-04 DIAGNOSIS — N183 Chronic kidney disease, stage 3 unspecified: Secondary | ICD-10-CM

## 2018-07-04 DIAGNOSIS — E785 Hyperlipidemia, unspecified: Secondary | ICD-10-CM

## 2018-07-04 DIAGNOSIS — M48062 Spinal stenosis, lumbar region with neurogenic claudication: Secondary | ICD-10-CM

## 2018-07-04 NOTE — Assessment & Plan Note (Signed)
His BP has not been checked currently.  Use of NSAIDs, identified. Advised to have BP checked in the next week.

## 2018-07-04 NOTE — Progress Notes (Signed)
Virtual Visit via Telephone Note  This visit type was conducted due to national recommendations for restrictions regarding the COVID-19 pandemic (e.g. social distancing).  This format is felt to be most appropriate for this patient at this time.  All issues noted in this document were discussed and addressed.  No physical exam was performed (except for noted visual exam findings with Video Visits).   I connected with@ on 07/04/18 at 10:00 AM EDT by a video enabled telemedicine application or telephone and verified that I am speaking with the correct person using two identifiers. Location patient: home Location provider: work or home office Persons participating in the virtual visit: patient, provider  I discussed the limitations, risks, security and privacy concerns of performing an evaluation and management service by telephone and the availability of in person appointments. I also discussed with the patient that there may be a patient responsible charge related to this service. The patient expressed understanding and agreed to proceed.  Reason for visit: MRI  RESULTS  HPI:   Patient was seen in March  FOR LUMBAR SPINE PAIN WITH RADIATION TO BOTH INGUINAL AREAS BUT NOT BEYOND.  Also having right leg numbness without weakness or foot drop     Her pain and numbness are aggravated by standing for periods  Of time greater than 15 mintues and limits her ability to cook.  However, she continues to engage in activities that aggravate her pain including yard work,  Which I have advised her not to do  MRI reviewed with patient: New onset moderate to severe spinal stenosis at l2-3  And progression of degenerative changes at multiple levels resulting in foraminal  stenosis  She has resumed taking meloxicam daily 7.5 mg  along with tylenol.  Taking protonix daily   Patient is taking her medications as prescribed and notes no adverse effects.  Home BP readings have not been done .   She is avoiding added  salt in her diet and walking regularly about 3 times per week for exercise  .Not checking BP    ROS: See pertinent positives and negatives per HPI.  Past Medical History:  Diagnosis Date  . Granuloma annulare   . Hyperlipidemia   . Hypertension   . Menopause   . Plantar fasciitis of right foot 12/24/2014    Past Surgical History:  Procedure Laterality Date  . APPENDECTOMY    . DILATION AND CURETTAGE OF UTERUS    . RIGHT OOPHORECTOMY Right feb 2014   [name]  . [place]  2010  . VENOUS ABLATION     bilateral    Family History  Problem Relation Age of Onset  . Early death Mother        durig childbirth  . Heart disease Father   . Heart attack Father   . Heart disease Sister   . Cancer Brother   . Heart disease Brother        Afib    SOCIAL HX: widowed, IADL,   No tobacco or alcohol    Current Outpatient Medications:  .  albuterol (PROVENTIL HFA;VENTOLIN HFA) 108 (90 Base) MCG/ACT inhaler, Inhale 1 puff into the lungs every 6 (six) hours as needed for wheezing or shortness of breath., Disp: , Rfl:  .  allopurinol (ZYLOPRIM) 100 MG tablet, TAKE 1 TABLET BY MOUTH  DAILY, Disp: 90 tablet, Rfl: 1 .  ANORO ELLIPTA 62.5-25 MCG/INH AEPB, Inhale 1 puff into the lungs daily. (Patient taking differently: Inhale 1 puff into the lungs daily  as needed. ), Disp: 1 each, Rfl: 6 .  conjugated estrogens (PREMARIN) vaginal cream, Place 1 Applicatorful vaginally 2 (two) times a week., Disp: 42.5 g, Rfl: 12 .  ezetimibe (ZETIA) 10 MG tablet, Take 1 tablet (10 mg total) by mouth daily., Disp: 90 tablet, Rfl: 1 .  fluticasone (FLONASE) 50 MCG/ACT nasal spray, USE 2 SPRAYS IN EACH  NOSTRIL DAILY, Disp: 48 g, Rfl: 3 .  hydrochlorothiazide (MICROZIDE) 12.5 MG capsule, Take 1 capsule (12.5 mg total) by mouth daily., Disp: 90 capsule, Rfl: 1 .  Lactulose 20 GM/30ML SOLN, 30 ml every 4 hours until constipation is relieved, Disp: 236 mL, Rfl: 3 .  losartan (COZAAR) 100 MG tablet, Take 1 tablet  (100 mg total) by mouth daily., Disp: 90 tablet, Rfl: 1 .  meloxicam (MOBIC) 7.5 MG tablet, Take 7.5 mg by mouth daily., Disp: , Rfl:  .  metoprolol succinate (TOPROL-XL) 25 MG 24 hr tablet, Take 1 tablet (25 mg total) by mouth daily., Disp: 90 tablet, Rfl: 1 .  ondansetron (ZOFRAN) 4 MG tablet, Take 1 tablet (4 mg total) by mouth every 8 (eight) hours as needed for nausea or vomiting., Disp: 20 tablet, Rfl: 0 .  pantoprazole (PROTONIX) 40 MG tablet, TAKE 1 TABLET BY MOUTH  DAILY, Disp: 90 tablet, Rfl: 1 .  sertraline (ZOLOFT) 100 MG tablet, TAKE 1 TABLET BY MOUTH  EVERY DAY, Disp: 90 tablet, Rfl: 1 .  simvastatin (ZOCOR) 40 MG tablet, Take 1 tablet (40 mg total) by mouth at bedtime., Disp: 90 tablet, Rfl: 1 .  Coenzyme Q10 (CO Q 10 PO), Take by mouth daily., Disp: , Rfl:  .  traMADol (ULTRAM) 50 MG tablet, Take 1 tablet (50 mg total) by mouth every 8 (eight) hours as needed. (Patient not taking: Reported on 05/22/2018), Disp: 30 tablet, Rfl: 0  EXAM:  VITALS per patient if applicable:  GENERAL: alert, oriented, appears well and in no acute distress  HEENT: atraumatic, conjunttiva clear, no obvious abnormalities on inspection of external nose and ears  NECK: normal movements of the head and neck  LUNGS: on inspection no signs of respiratory distress, breathing rate appears normal, no obvious gross SOB, gasping or wheezing  CV: no obvious cyanosis  MS: moves all visible extremities without noticeable abnormality  PSYCH/NEURO: pleasant and cooperative, no obvious depression or anxiety, speech and thought processing grossly intact  ASSESSMENT AND PLAN:  Discussed the following assessment and plan:  Neurogenic claudication due to lumbar spinal stenosis - Plan: Ambulatory referral to Neurosurgery  Essential hypertension - Plan: Comprehensive metabolic panel  CKD (chronic kidney disease) stage 3, GFR 30-59 ml/min (HCC) - Plan: Comprehensive metabolic panel  Hyperlipidemia LDL goal <100  - Plan: Lipid panel  Degenerative lumbar spinal stenosis  Degenerative lumbar spinal stenosis persistent with radiculopathy at the L3 level, confirmed with MRI .  Referring to Neurosurgery for evaluation   Hypertension His BP has not been checked currently.  Use of NSAIDs, identified. Advised to have BP checked in the next week.     I discussed the assessment and treatment plan with the patient. The patient was provided an opportunity to ask questions and all were answered. The patient agreed with the plan and demonstrated an understanding of the instructions.   The patient was advised to call back or seek an in-person evaluation if the symptoms worsen or if the condition fails to improve as anticipated.  I provided 24  minutes of non-face-to-face time during this encounter.   Aris Everts  Derrel Nip, MD

## 2018-07-04 NOTE — Assessment & Plan Note (Signed)
persistent with radiculopathy at the L3 level, confirmed with MRI .  Referring to Neurosurgery for evaluation

## 2018-07-07 ENCOUNTER — Telehealth: Payer: Self-pay

## 2018-07-07 NOTE — Telephone Encounter (Signed)
Copied from [place] [id number]. Topic: General - Other >> Jul 07, 2018 11:30 AM Celene Kras A wrote: Reason for CRM: Pt called stating PCP was supposed to set pt up with a surgeon, but she has not heard back. She states she had been to a surgeon before and would like to go back to him if he is available. Please advise.  [facility] Fanny Bien  Phone # [phone]

## 2018-07-11 NOTE — Telephone Encounter (Signed)
Referral sent to [facility] (used to be [place]). They are reviewing the referral and will call her as soon as they can.

## 2018-07-11 NOTE — Telephone Encounter (Signed)
Patient's daughter is calling stating her mother has not received a call yet concerning her surgeon or appointment. Call back [phone]

## 2018-07-11 NOTE — Telephone Encounter (Signed)
Spoke with pt and informed her of Melissa's message below. The pt gave a verbal understanding.

## 2018-07-12 ENCOUNTER — Other Ambulatory Visit (INDEPENDENT_AMBULATORY_CARE_PROVIDER_SITE_OTHER): Payer: Medicare Other

## 2018-07-12 ENCOUNTER — Other Ambulatory Visit: Payer: Self-pay

## 2018-07-12 DIAGNOSIS — I10 Essential (primary) hypertension: Secondary | ICD-10-CM

## 2018-07-12 DIAGNOSIS — E785 Hyperlipidemia, unspecified: Secondary | ICD-10-CM | POA: Diagnosis not present

## 2018-07-12 DIAGNOSIS — N183 Chronic kidney disease, stage 3 unspecified: Secondary | ICD-10-CM

## 2018-07-12 LAB — LIPID PANEL
Cholesterol: 162 mg/dL (ref 0–200)
HDL: 74.4 mg/dL (ref >39.00)
LDL Cholesterol: 73 mg/dL (ref 0–99)
NonHDL: 87.25
Total CHOL/HDL Ratio: 2
Triglycerides: 73 mg/dL (ref 0.0–149.0)
VLDL: 14.6 mg/dL (ref 0.0–40.0)

## 2018-07-12 LAB — COMPREHENSIVE METABOLIC PANEL
ALT: 18 U/L (ref 0–35)
AST: 19 U/L (ref 0–37)
Albumin: 4.3 g/dL (ref 3.5–5.2)
Alkaline Phosphatase: 111 U/L (ref 39–117)
BUN: 26 mg/dL — ABNORMAL HIGH (ref 6–23)
CO2: 28 mEq/L (ref 19–32)
Calcium: 9.4 mg/dL (ref 8.4–10.5)
Chloride: 104 mEq/L (ref 96–112)
Creatinine, Ser: 1.15 mg/dL (ref 0.40–1.20)
GFR: 45.32 mL/min — ABNORMAL LOW (ref >60.00)
Glucose, Bld: 93 mg/dL (ref 70–99)
Potassium: 4.9 mEq/L (ref 3.5–5.1)
Sodium: 140 mEq/L (ref 135–145)
Total Bilirubin: 0.4 mg/dL (ref 0.2–1.2)
Total Protein: 6.4 g/dL (ref 6.0–8.3)

## 2018-07-13 ENCOUNTER — Other Ambulatory Visit: Payer: Self-pay | Admitting: Internal Medicine

## 2018-07-20 ENCOUNTER — Telehealth: Payer: Self-pay | Admitting: Internal Medicine

## 2018-07-20 ENCOUNTER — Other Ambulatory Visit: Payer: Self-pay | Admitting: Internal Medicine

## 2018-07-20 DIAGNOSIS — N183 Chronic kidney disease, stage 3 unspecified: Secondary | ICD-10-CM

## 2018-07-20 NOTE — Telephone Encounter (Signed)
See unrouted message re meloxicam

## 2018-07-21 NOTE — Telephone Encounter (Signed)
See result note message 

## 2018-07-29 ENCOUNTER — Other Ambulatory Visit: Payer: Self-pay | Admitting: Internal Medicine

## 2018-08-09 ENCOUNTER — Other Ambulatory Visit: Payer: Self-pay

## 2018-08-09 ENCOUNTER — Ambulatory Visit
Admission: RE | Admit: 2018-08-09 | Discharge: 2018-08-09 | Disposition: A | Discharge status: Home / Self Care | Payer: Medicare Other | Source: Ambulatory Visit | Attending: Internal Medicine | Admitting: Internal Medicine

## 2018-08-09 DIAGNOSIS — Z1231 Encounter for screening mammogram for malignant neoplasm of breast: Secondary | ICD-10-CM | POA: Insufficient documentation

## 2018-08-10 ENCOUNTER — Telehealth (INDEPENDENT_AMBULATORY_CARE_PROVIDER_SITE_OTHER): Payer: Self-pay

## 2018-08-10 NOTE — Telephone Encounter (Signed)
I spoke with patient and she is being schedule for right leg reflux ultrasound see provider

## 2018-08-17 ENCOUNTER — Other Ambulatory Visit: Payer: Self-pay

## 2018-08-17 ENCOUNTER — Other Ambulatory Visit (INDEPENDENT_AMBULATORY_CARE_PROVIDER_SITE_OTHER): Payer: Self-pay | Admitting: Vascular Surgery

## 2018-08-17 ENCOUNTER — Ambulatory Visit (INDEPENDENT_AMBULATORY_CARE_PROVIDER_SITE_OTHER): Payer: Medicare Other

## 2018-08-17 DIAGNOSIS — Z0000 Encounter for general adult medical examination without abnormal findings: Secondary | ICD-10-CM | POA: Diagnosis not present

## 2018-08-17 DIAGNOSIS — I83891 Varicose veins of right lower extremities with other complications: Secondary | ICD-10-CM

## 2018-08-17 NOTE — Patient Instructions (Addendum)
  Ms. Mcwhirter , Thank you for taking time to come for your Medicare Wellness Visit. I appreciate your ongoing commitment to your health goals. Please review the following plan we discussed and let me know if I can assist you in the future.   These are the goals we discussed: Goals      Patient Stated   . Increase physical activity (pt-stated)     Walk more for exercise Monitor diet better       This is a list of the screening recommended for you and due dates:  Health Maintenance  Topic Date Due  . Flu Shot  10/07/2018  . Tetanus Vaccine  12/07/2023  . DEXA scan (bone density measurement)  Completed  . Pneumonia vaccines  Completed

## 2018-08-17 NOTE — Progress Notes (Addendum)
Subjective:   Jamie Anderson is a 83 y.o. female who presents for Medicare Annual (Subsequent) preventive examination.  Review of Systems:  No ROS.  Medicare Wellness Virtual Visit.  Visual/audio telehealth visit, UTA vital signs.   See social history for additional risk factors.   Cardiac Risk Factors include: advanced age (>76men, >43 women);hypertension     Objective:     Vitals: There were no vitals taken for this visit.  There is no height or weight on file to calculate BMI.  Advanced Directives 08/17/2018 03/15/2018 05/31/2017 01/18/2017 04/20/2016  Does Patient Have a Medical Advance Directive? Yes Yes Yes Yes Yes  Type of Advance Directive Living will;Healthcare Power of Attorney Living will;Healthcare Power of [place];Living will [place];Living will [place];Living will  Does patient want to make changes to medical advance directive? No - Patient declined No - Patient declined No - Patient declined - No - Patient declined  Copy of [place] in Chart? No - copy requested No - copy requested No - copy requested No - copy requested No - copy requested  Would patient like information on creating a medical advance directive? - - - No - Patient declined -    Tobacco Social History   Tobacco Use  Smoking Status Never Smoker  Smokeless Tobacco Never Used     Counseling given: Not Answered   Clinical Intake:  Pre-visit preparation completed: Yes        Diabetes: No  How often do you need to have someone help you when you read instructions, pamphlets, or other written materials from your doctor or pharmacy?: 1 - Never  Interpreter Needed?: No     Past Medical History:  Diagnosis Date  . Granuloma annulare   . Hyperlipidemia   . Hypertension   . Menopause   . Plantar fasciitis of right foot 12/24/2014   Past Surgical History:  Procedure Laterality Date  . APPENDECTOMY     . DILATION AND CURETTAGE OF UTERUS    . RIGHT OOPHORECTOMY Right feb 2014   [name]  . [place]  2010  . VENOUS ABLATION     bilateral   Family History  Problem Relation Age of Onset  . Early death Mother        durig childbirth  . Heart disease Father   . Heart attack Father   . Heart disease Sister   . Cancer Brother   . Heart disease Brother        Afib   Social History   Socioeconomic History  . Marital status: Widowed    Spouse name: Not on file  . Number of children: Not on file  . Years of education: Not on file  . Highest education level: Not on file  Occupational History  . Not on file  Social Needs  . Financial resource strain: Not hard at all  . Food insecurity    Worry: Never true    Inability: Never true  . Transportation needs    Medical: No    Non-medical: No  Tobacco Use  . Smoking status: Never Smoker  . Smokeless tobacco: Never Used  Substance and Sexual Activity  . Alcohol use: No    Alcohol/week: 4.0 standard drinks    Types: 4 Glasses of wine per week    Frequency: Never  . Drug use: No  . Sexual activity: Not Currently  Lifestyle  . Physical activity  Days per week: 0 days    Minutes per session: Not on file  . Stress: Not on file  Relationships  . Social Herbalist on phone: Not on file    Gets together: Not on file    Attends religious service: Not on file    Active member of club or organization: Not on file    Attends meetings of clubs or organizations: Not on file    Relationship status: Not on file  Other Topics Concern  . Not on file  Social History Narrative  . Not on file    Outpatient Encounter Medications as of 08/17/2018  Medication Sig  . albuterol (PROVENTIL HFA;VENTOLIN HFA) 108 (90 Base) MCG/ACT inhaler Inhale 1 puff into the lungs every 6 (six) hours as needed for wheezing or shortness of breath.  . allopurinol (ZYLOPRIM) 100 MG tablet TAKE 1 TABLET BY MOUTH  DAILY  . ANORO ELLIPTA 62.5-25  MCG/INH AEPB Inhale 1 puff into the lungs daily. (Patient taking differently: Inhale 1 puff into the lungs daily as needed. )  . Coenzyme Q10 (CO Q 10 PO) Take by mouth daily.  Marland Kitchen conjugated estrogens (PREMARIN) vaginal cream Place 1 Applicatorful vaginally 2 (two) times a week.  . ezetimibe (ZETIA) 10 MG tablet Take 1 tablet (10 mg total) by mouth daily.  . fluticasone (FLONASE) 50 MCG/ACT nasal spray USE 2 SPRAYS IN EACH  NOSTRIL DAILY  . hydrochlorothiazide (MICROZIDE) 12.5 MG capsule Take 1 capsule (12.5 mg total) by mouth daily.  . Lactulose 20 GM/30ML SOLN 30 ml every 4 hours until constipation is relieved  . losartan (COZAAR) 100 MG tablet Take 1 tablet (100 mg total) by mouth daily.  . metoprolol succinate (TOPROL-XL) 25 MG 24 hr tablet Take 1 tablet (25 mg total) by mouth daily.  . ondansetron (ZOFRAN) 4 MG tablet Take 1 tablet (4 mg total) by mouth every 8 (eight) hours as needed for nausea or vomiting.  . pantoprazole (PROTONIX) 40 MG tablet TAKE 1 TABLET BY MOUTH  DAILY  . sertraline (ZOLOFT) 100 MG tablet TAKE 1 TABLET BY MOUTH  EVERY DAY  . simvastatin (ZOCOR) 40 MG tablet Take 1 tablet (40 mg total) by mouth at bedtime.  . traMADol (ULTRAM) 50 MG tablet Take 1 tablet (50 mg total) by mouth every 8 (eight) hours as needed.   No facility-administered encounter medications on file as of 08/17/2018.     Activities of Daily Living In your present state of health, do you have any difficulty performing the following activities: 08/17/2018  Hearing? N  Vision? N  Difficulty concentrating or making decisions? N  Walking or climbing stairs? Y  Comment Some difficulty climbing stairs, paces herself  Dressing or bathing? N  Doing errands, shopping? N  Preparing Food and eating ? N  Using the Toilet? N  In the past six months, have you accidently leaked urine? N  Do you have problems with loss of bowel control? N  Managing your Medications? N  Managing your Finances? N  Housekeeping  or managing your Housekeeping? N  Some recent data might be hidden    Patient Care Team: Crecencio Mc, MD as PCP - General (Internal Medicine)    Assessment:   This is a routine wellness examination for [place].  I connected with patient 08/17/18 at 12:00 PM EDT by a video/audio enabled telemedicine application and verified that I am speaking with the correct person using two identifiers. Patient stated full name and DOB.  Patient gave permission to continue with virtual visit. Patient's location was at home and Nurse's location was at [place] office.   Health Screenings  Mammogram - 08/2018 Colonoscopy - 11/2006 Bone Density - 02/2015 Glaucoma -none Hearing -demonstrates normal hearing during visit. Hemoglobin A1C - 01/2018 Cholesterol - 07/2018 Dental- dentures Vision- visits within the last 12 months.  Social  Alcohol intake - no         Smoking history- never    Smokers in home? none Illicit drug use? none Exercise - walking as tolerated Diet - regular Sexually Active -not currently BMI- discussed the importance of a healthy diet, water intake and the benefits of aerobic exercise.  Educational material provided.   Safety  Patient feels safe at home- yes Patient does have smoke detectors at home- yes Patient does wear sunscreen or protective clothing when in direct sunlight -yes Patient does wear seat belt when in a moving vehicle -yes  Covid-19 precautions and sickness symptoms discussed.   Activities of Daily Living Patient denies needing assistance with: driving, household chores, feeding themselves, getting from bed to chair, getting to the toilet, bathing/showering, dressing, managing money, or preparing meals.  No new identified risk were noted.    Depression Screen Patient denies losing interest in daily life, feeling hopeless, or crying easily over simple problems.   Medication-taking as directed and without issues.   Fall Screen Patient denies being afraid  of falling or falling in the last year.   Memory Screen Patient is alert.  Patient denies difficulty focusing, concentrating or misplacing items. Correctly identified the president of the [place] , season and recall. Patient likes to reads the newspaper and complete puzzles for brain stimulation.  Immunizations The following Immunizations were discussed: Influenza, shingles, pneumonia, and tetanus.   Other Providers Patient Care Team: Crecencio Mc, MD as PCP - General (Internal Medicine)  Exercise Activities and Dietary recommendations Current Exercise Habits: The patient does not participate in regular exercise at present  Goals      Patient Stated   . Increase physical activity (pt-stated)     Walk more for exercise Monitor diet better       Fall Risk Fall Risk  08/17/2018 05/31/2017 04/20/2016 01/16/2016 01/15/2015  Falls in the past year? 0 No No No No   Depression Screen PHQ 2/9 Scores 08/17/2018 05/31/2017 04/20/2016 01/16/2016  PHQ - 2 Score 0 0 0 0     Cognitive Function MMSE - Mini Mental State Exam 04/20/2016  Orientation to time 5  Orientation to Place 5  Registration 3  Attention/ Calculation 5  Recall 3  Language- name 2 objects 2  Language- repeat 1  Language- follow 3 step command 3  Language- read & follow direction 1  Write a sentence 1  Copy design 1  Total score 30     6CIT Screen 08/17/2018 05/31/2017  What Year? 0 points 0 points  What month? 0 points 0 points  What time? 0 points 0 points  Count back from 20 0 points 0 points  Months in reverse 0 points 2 points  Repeat phrase - 0 points  Total Score - 2    Immunization History  Administered Date(s) Administered  . Influenza Split 11/25/2011  . Influenza, High Dose Seasonal PF 01/30/2018  . Influenza,inj,Quad PF,6+ Mos 11/22/2012  . Influenza-Unspecified 12/20/2013, 01/07/2015, 01/09/2016, 12/15/2016  . Pneumococcal Conjugate-13 01/03/2014  . Pneumococcal Polysaccharide-23 11/25/2011,  02/09/2018  . Tdap 12/06/2013  . Zoster 12/29/2009   Screening Tests Health  Maintenance  Topic Date Due  . INFLUENZA VACCINE  10/07/2018  . TETANUS/TDAP  12/07/2023  . DEXA SCAN  Completed  . PNA vac Low Risk Adult  Completed      Plan:    End of life planning; Advance aging; Advanced directives discussed.  Copy of current HCPOA/Living Will requested.    I have personally reviewed and noted the following in the patient's chart:   . Medical and social history . Use of alcohol, tobacco or illicit drugs  . Current medications and supplements . Functional ability and status . Nutritional status . Physical activity . Advanced directives . List of other physicians . Hospitalizations, surgeries, and ER visits in previous 12 months . Vitals . Screenings to include cognitive, depression, and falls . Referrals and appointments  In addition, I have reviewed and discussed with patient certain preventive protocols, quality metrics, and best practice recommendations. A written personalized care plan for preventive services as well as general preventive health recommendations were provided to patient.     OBrien-Blaney, Koy Lamp L, LPN  05/30/4980   I have reviewed the above information and agree with above.   Deborra Medina, MD

## 2018-08-21 ENCOUNTER — Other Ambulatory Visit (INDEPENDENT_AMBULATORY_CARE_PROVIDER_SITE_OTHER): Payer: Medicare Other

## 2018-08-21 ENCOUNTER — Ambulatory Visit (INDEPENDENT_AMBULATORY_CARE_PROVIDER_SITE_OTHER): Payer: Medicare Other | Admitting: Nurse Practitioner

## 2018-08-21 ENCOUNTER — Ambulatory Visit (INDEPENDENT_AMBULATORY_CARE_PROVIDER_SITE_OTHER): Payer: Medicare Other

## 2018-08-21 ENCOUNTER — Encounter

## 2018-08-21 ENCOUNTER — Other Ambulatory Visit: Payer: Self-pay

## 2018-08-21 ENCOUNTER — Encounter (INDEPENDENT_AMBULATORY_CARE_PROVIDER_SITE_OTHER): Payer: Self-pay | Admitting: Nurse Practitioner

## 2018-08-21 VITALS — BP 173/76 | HR 67 | Resp 15 | Ht 64.0 in | Wt 193.8 lb

## 2018-08-21 DIAGNOSIS — N183 Chronic kidney disease, stage 3 unspecified: Secondary | ICD-10-CM

## 2018-08-21 DIAGNOSIS — I83893 Varicose veins of bilateral lower extremities with other complications: Secondary | ICD-10-CM

## 2018-08-21 DIAGNOSIS — I83891 Varicose veins of right lower extremities with other complications: Secondary | ICD-10-CM | POA: Diagnosis not present

## 2018-08-21 DIAGNOSIS — E785 Hyperlipidemia, unspecified: Secondary | ICD-10-CM | POA: Diagnosis not present

## 2018-08-21 DIAGNOSIS — I10 Essential (primary) hypertension: Secondary | ICD-10-CM | POA: Diagnosis not present

## 2018-08-21 DIAGNOSIS — Z79899 Other long term (current) drug therapy: Secondary | ICD-10-CM

## 2018-08-22 LAB — BASIC METABOLIC PANEL
BUN: 23 mg/dL (ref 6–23)
CO2: 27 mEq/L (ref 19–32)
Calcium: 9.7 mg/dL (ref 8.4–10.5)
Chloride: 105 mEq/L (ref 96–112)
Creatinine, Ser: 1.02 mg/dL (ref 0.40–1.20)
GFR: 52.03 mL/min — ABNORMAL LOW (ref >60.00)
Glucose, Bld: 83 mg/dL (ref 70–99)
Potassium: 4.3 mEq/L (ref 3.5–5.1)
Sodium: 138 mEq/L (ref 135–145)

## 2018-08-27 ENCOUNTER — Encounter (INDEPENDENT_AMBULATORY_CARE_PROVIDER_SITE_OTHER): Payer: Self-pay | Admitting: Nurse Practitioner

## 2018-08-27 DIAGNOSIS — I83893 Varicose veins of bilateral lower extremities with other complications: Secondary | ICD-10-CM | POA: Insufficient documentation

## 2018-08-27 NOTE — Progress Notes (Signed)
SUBJECTIVE:  Patient ID: Jamie Anderson, female    DOB: 03-22-1935, 83 y.o.   MRN: [id number] Chief Complaint  Patient presents with  . Follow-up     ultrasound follow up    HPI  Jamie Anderson is a 83 y.o. female that presents to day with complaints of prominent varicose veins that have had issues with bleeding.  The patient states that she was in the shower and her varicose vein began to bleed spontaneously on her right lower extremity.  She needed emergency attention in order to stop the bleeding.  She states that it began to bleed a second time however she was able to stop with holding pressure.  She states that now she has issues with removing the bandaging because whenever she does the wound begins to bleed again.  Patient denies any fever, chills, nausea, vomiting or diarrhea.  The patient denies any severe trauma to the area.  Patient believes she may have had a vein stripping many years ago.  Noninvasive studies show reflux within the right popliteal, saphenofemoral junction, and great saphenous vein as well as small saphenous vein.  Past Medical History:  Diagnosis Date  . Granuloma annulare   . Hyperlipidemia   . Hypertension   . Menopause   . Plantar fasciitis of right foot 12/24/2014    Past Surgical History:  Procedure Laterality Date  . APPENDECTOMY    . DILATION AND CURETTAGE OF UTERUS    . RIGHT OOPHORECTOMY Right feb 2014   [name]  . [place]  2010  . VENOUS ABLATION     bilateral    Social History   Socioeconomic History  . Marital status: Widowed    Spouse name: Not on file  . Number of children: Not on file  . Years of education: Not on file  . Highest education level: Not on file  Occupational History  . Not on file  Social Needs  . Financial resource strain: Not hard at all  . Food insecurity    Worry: Never true    Inability: Never true  . Transportation needs    Medical: No    Non-medical: No  Tobacco Use  . Smoking status: Never  Smoker  . Smokeless tobacco: Never Used  Substance and Sexual Activity  . Alcohol use: No    Alcohol/week: 4.0 standard drinks    Types: 4 Glasses of wine per week    Frequency: Never  . Drug use: No  . Sexual activity: Not Currently  Lifestyle  . Physical activity    Days per week: 0 days    Minutes per session: Not on file  . Stress: Not on file  Relationships  . Social Herbalist on phone: Not on file    Gets together: Not on file    Attends religious service: Not on file    Active member of club or organization: Not on file    Attends meetings of clubs or organizations: Not on file    Relationship status: Not on file  . Intimate partner violence    Fear of current or ex partner: Not on file    Emotionally abused: Not on file    Physically abused: Not on file    Forced sexual activity: Not on file  Other Topics Concern  . Not on file  Social History Narrative  . Not on file    Family History  Problem Relation Age of Onset  . Early death Mother  durig childbirth  . Heart disease Father   . Heart attack Father   . Heart disease Sister   . Cancer Brother   . Heart disease Brother        Afib    Allergies  Allergen Reactions  . Biaxin [Clarithromycin]   . Corn-Containing Products   . Dust Mite Extract     Almonds,Mold Spores  . Gluten Meal   . Oat   . Shrimp [Shellfish Allergy]      Review of Systems   Review of Systems: Negative Unless Checked Constitutional: [] Weight loss  [] Fever  [] Chills Cardiac: [] Chest pain   []  Atrial Fibrillation  [] Palpitations   [] Shortness of breath when laying flat   [] Shortness of breath with exertion. [] Shortness of breath at rest Vascular:  [] Pain in legs with walking   [] Pain in legs with standing [] Pain in legs when laying flat   [] Claudication    [] Pain in feet when laying flat    [] History of DVT   [] Phlebitis   [x] Swelling in legs   [x] Varicose veins   [] Non-healing ulcers Pulmonary:   [] Uses home  oxygen   [] Productive cough   [] Hemoptysis   [] Wheeze  [] COPD   [] Asthma Neurologic:  [] Dizziness   [] Seizures  [] Blackouts [] History of stroke   [] History of TIA  [] Aphasia   [] Temporary Blindness   [] Weakness or numbness in arm   [] Weakness or numbness in leg Musculoskeletal:   [] Joint swelling   [] Joint pain   [] Low back pain  []  History of Knee Replacement [x] Arthritis [] back Surgeries  []  Spinal Stenosis    Hematologic:  [] Easy bruising  [] Easy bleeding   [] Hypercoagulable state   [] Anemic Gastrointestinal:  [] Diarrhea   [] Vomiting  [] Gastroesophageal reflux/heartburn   [] Difficulty swallowing. [] Abdominal pain Genitourinary:  [x] Chronic kidney disease   [] Difficult urination  [] Anuric   [] Blood in urine [] Frequent urination  [] Burning with urination   [] Hematuria Skin:  [] Rashes   [] Ulcers [] Wounds Psychological:  [] History of anxiety   []  History of major depression  []  Memory Difficulties      OBJECTIVE:   Physical Exam  BP (!) 173/76 (BP Location: Right Arm)   Pulse 67   Resp 15   Ht 5\' 4"  (1.626 m)   Wt 193 lb 12.8 oz (87.9 kg)   BMI 33.27 kg/m   Gen: WD/WN, NAD Head: [place]/AT, No temporalis wasting.  Ear/Nose/Throat: Hearing grossly intact, nares w/o erythema or drainage Eyes: PER, EOMI, sclera nonicteric.  Neck: Supple, no masses.  No JVD.  Pulmonary:  Good air movement, no use of accessory muscles.  Cardiac: RRR Vascular: scattered varicosities present bilaterally.  Mild venous stasis changes to the legs bilaterally.  2+ soft pitting edema  Vessel Right Left  Radial Palpable Palpable  Dorsalis Pedis Palpable Palpable  Posterior Tibial Palpable Palpable   Gastrointestinal: soft, non-distended. No guarding/no peritoneal signs.  Musculoskeletal: M/S 5/5 throughout.  No deformity or atrophy.  Neurologic: Pain and light touch intact in extremities.  Symmetrical.  Speech is fluent. Motor exam as listed above. Psychiatric: Judgment intact, Mood & affect appropriate for pt's  clinical situation. Dermatologic: No Venous rashes. No Ulcers Noted.  No changes consistent with cellulitis. Lymph : No Cervical lymphadenopathy, no lichenification or skin changes of chronic lymphedema.       ASSESSMENT AND PLAN:  1. Varicose veins of both lower extremities with complications Recommend:  The patient has had multiple bleeds requiring emergency attention from her varicose veins on the right lower extremity.  Given her age this is a concern for safety.  Patient should undergo emergent injection sclerotherapy to treat the residual varicosities.  The risks, benefits and alternative therapies were reviewed in detail with the patient.  All questions were answered.  The patient agrees to proceed with sclerotherapy at their convenience.  The patient will continue wearing the graduated compression stockings and using the over-the-counter pain medications to treat her symptoms.       2. Essential hypertension Continue antihypertensive medications as already ordered, these medications have been reviewed and there are no changes at this time.   3. Hyperlipidemia LDL goal <130 Continue statin as ordered and reviewed, no changes at this time    Current Outpatient Medications on File Prior to Visit  Medication Sig Dispense Refill  . albuterol (PROVENTIL HFA;VENTOLIN HFA) 108 (90 Base) MCG/ACT inhaler Inhale 1 puff into the lungs every 6 (six) hours as needed for wheezing or shortness of breath.    . allopurinol (ZYLOPRIM) 100 MG tablet TAKE 1 TABLET BY MOUTH  DAILY 90 tablet 1  . ANORO ELLIPTA 62.5-25 MCG/INH AEPB Inhale 1 puff into the lungs daily. (Patient taking differently: Inhale 1 puff into the lungs daily as needed. ) 1 each 6  . Coenzyme Q10 (CO Q 10 PO) Take by mouth daily.    Marland Kitchen conjugated estrogens (PREMARIN) vaginal cream Place 1 Applicatorful vaginally 2 (two) times a week. 42.5 g 12  . ezetimibe (ZETIA) 10 MG tablet Take 1 tablet (10 mg total) by mouth daily. 90  tablet 1  . fluticasone (FLONASE) 50 MCG/ACT nasal spray USE 2 SPRAYS IN EACH  NOSTRIL DAILY 48 g 3  . hydrochlorothiazide (MICROZIDE) 12.5 MG capsule Take 1 capsule (12.5 mg total) by mouth daily. 90 capsule 1  . Lactulose 20 GM/30ML SOLN 30 ml every 4 hours until constipation is relieved 236 mL 3  . losartan (COZAAR) 100 MG tablet Take 1 tablet (100 mg total) by mouth daily. 90 tablet 1  . meloxicam (MOBIC) 7.5 MG tablet Take 7.5 mg by mouth as needed for pain.    . metoprolol succinate (TOPROL-XL) 25 MG 24 hr tablet Take 1 tablet (25 mg total) by mouth daily. 90 tablet 1  . ondansetron (ZOFRAN) 4 MG tablet Take 1 tablet (4 mg total) by mouth every 8 (eight) hours as needed for nausea or vomiting. 20 tablet 0  . pantoprazole (PROTONIX) 40 MG tablet TAKE 1 TABLET BY MOUTH  DAILY 90 tablet 1  . sertraline (ZOLOFT) 100 MG tablet TAKE 1 TABLET BY MOUTH  EVERY DAY 90 tablet 1  . simvastatin (ZOCOR) 40 MG tablet Take 1 tablet (40 mg total) by mouth at bedtime. 90 tablet 1  . traMADol (ULTRAM) 50 MG tablet Take 1 tablet (50 mg total) by mouth every 8 (eight) hours as needed. 30 tablet 0   No current facility-administered medications on file prior to visit.     There are no Patient Instructions on file for this visit. No follow-ups on file.   Kris Hartmann, NP  This note was completed with Sales executive.  Any errors are purely unintentional.

## 2018-09-06 ENCOUNTER — Encounter (INDEPENDENT_AMBULATORY_CARE_PROVIDER_SITE_OTHER): Payer: Self-pay | Admitting: Vascular Surgery

## 2018-09-06 ENCOUNTER — Other Ambulatory Visit: Payer: Self-pay

## 2018-09-06 ENCOUNTER — Ambulatory Visit (INDEPENDENT_AMBULATORY_CARE_PROVIDER_SITE_OTHER): Payer: Medicare Other | Admitting: Vascular Surgery

## 2018-09-06 VITALS — BP 156/69 | HR 73 | Resp 16 | Ht 64.0 in | Wt 193.0 lb

## 2018-09-06 DIAGNOSIS — I8311 Varicose veins of right lower extremity with inflammation: Secondary | ICD-10-CM | POA: Diagnosis not present

## 2018-09-06 DIAGNOSIS — I83893 Varicose veins of bilateral lower extremities with other complications: Secondary | ICD-10-CM

## 2018-09-06 NOTE — Progress Notes (Signed)
Varicose veins of right  lower extremity with inflammation (454.1  I83.10) Current Plans   Indication: Patient presents with symptomatic varicose veins of the right  lower extremity.   Procedure: Sclerotherapy using hypertonic saline mixed with 1% Lidocaine was performed on the right lower extremity. Compression wraps were placed. The patient tolerated the procedure well. 

## 2018-09-19 ENCOUNTER — Ambulatory Visit (INDEPENDENT_AMBULATORY_CARE_PROVIDER_SITE_OTHER): Payer: Medicare Other | Admitting: Vascular Surgery

## 2018-09-19 ENCOUNTER — Encounter (INDEPENDENT_AMBULATORY_CARE_PROVIDER_SITE_OTHER): Payer: Medicare Other

## 2018-09-20 ENCOUNTER — Telehealth: Payer: Self-pay

## 2018-09-20 NOTE — Telephone Encounter (Signed)
Copied from [place]. Topic: General - Inquiry >> Sep 20, 2018  3:08 PM Virl Axe D wrote: Reason for CRM: Pt stated she has an appt with [facility]/Dr. Nelva Bush on 10/03/18 and needs to pick up a copy of her MRI on a disk. Please advise when disk is ready for pickup

## 2018-09-20 NOTE — Telephone Encounter (Signed)
Spoke with pt and to let her know that we have no way of making a copy of her MRI because it was not done here. Explained to pt that she would need to contact the imaging center that performed her MRI. Pt gave a verbal understanding.

## 2018-10-03 DIAGNOSIS — M961 Postlaminectomy syndrome, not elsewhere classified: Secondary | ICD-10-CM | POA: Diagnosis not present

## 2018-10-03 DIAGNOSIS — M48061 Spinal stenosis, lumbar region without neurogenic claudication: Secondary | ICD-10-CM | POA: Diagnosis not present

## 2018-10-04 ENCOUNTER — Other Ambulatory Visit: Payer: Self-pay

## 2018-10-04 ENCOUNTER — Ambulatory Visit (INDEPENDENT_AMBULATORY_CARE_PROVIDER_SITE_OTHER): Payer: Medicare Other | Admitting: Vascular Surgery

## 2018-10-04 ENCOUNTER — Encounter (INDEPENDENT_AMBULATORY_CARE_PROVIDER_SITE_OTHER): Payer: Self-pay | Admitting: Vascular Surgery

## 2018-10-04 VITALS — BP 151/75 | HR 69 | Resp 12 | Ht 64.0 in | Wt 194.0 lb

## 2018-10-04 DIAGNOSIS — I83893 Varicose veins of bilateral lower extremities with other complications: Secondary | ICD-10-CM | POA: Diagnosis not present

## 2018-10-04 NOTE — Addendum Note (Signed)
Addended by: Marcelle Overlie A on: 10/04/2018 10:36 AM   Modules accepted: Level of Service

## 2018-10-04 NOTE — Progress Notes (Signed)
Varicose veins of bilateral lower extremity with inflammation (454.1  I83.10) Current Plans   Indication: Patient presents with symptomatic varicose veins of the bilateral lower extremity.   Procedure: Sclerotherapy using hypertonic saline mixed with 1% Lidocaine was performed on the bilateral lower extremity. Compression wraps were placed. The patient tolerated the procedure well.  Patient will need foam sclerotherapy into the large chain of varicosities located on the left lower extremity. Foam sclerotherapy procedure explained to patient. Patient would like to proceed.  Spoke with Tammy our check out receptionist to please place patient on schedule for foam sclerotherapy next appointment as well as documented it on the billing sheet.

## 2018-10-08 ENCOUNTER — Other Ambulatory Visit: Payer: Self-pay | Admitting: Internal Medicine

## 2018-10-08 ENCOUNTER — Other Ambulatory Visit: Payer: Self-pay | Admitting: Cardiovascular Disease

## 2018-10-08 DIAGNOSIS — M25561 Pain in right knee: Secondary | ICD-10-CM

## 2018-10-23 DIAGNOSIS — M545 Low back pain: Secondary | ICD-10-CM | POA: Diagnosis not present

## 2018-10-23 DIAGNOSIS — M48061 Spinal stenosis, lumbar region without neurogenic claudication: Secondary | ICD-10-CM | POA: Diagnosis not present

## 2018-10-24 ENCOUNTER — Encounter (INDEPENDENT_AMBULATORY_CARE_PROVIDER_SITE_OTHER): Payer: Self-pay

## 2018-10-24 ENCOUNTER — Ambulatory Visit (INDEPENDENT_AMBULATORY_CARE_PROVIDER_SITE_OTHER): Payer: Medicare Other | Admitting: Vascular Surgery

## 2018-10-24 ENCOUNTER — Other Ambulatory Visit: Payer: Self-pay

## 2018-10-24 ENCOUNTER — Encounter (INDEPENDENT_AMBULATORY_CARE_PROVIDER_SITE_OTHER): Payer: Self-pay | Admitting: Vascular Surgery

## 2018-10-24 VITALS — BP 120/67 | HR 71 | Resp 12 | Ht 63.0 in | Wt 194.0 lb

## 2018-10-24 DIAGNOSIS — I83893 Varicose veins of bilateral lower extremities with other complications: Secondary | ICD-10-CM

## 2018-10-24 DIAGNOSIS — I83892 Varicose veins of left lower extremities with other complications: Secondary | ICD-10-CM | POA: Diagnosis not present

## 2018-10-24 NOTE — Progress Notes (Signed)
  Jamie Anderson is a 83 y.o.female who presents with painful varicose veins of the left leg  Past Medical History:  Diagnosis Date  . Granuloma annulare   . Hyperlipidemia   . Hypertension   . Menopause   . Plantar fasciitis of right foot 12/24/2014    Past Surgical History:  Procedure Laterality Date  . APPENDECTOMY    . DILATION AND CURETTAGE OF UTERUS    . RIGHT OOPHORECTOMY Right feb 2014   [name]  . [place]  2010  . VENOUS ABLATION     bilateral    Current Outpatient Medications  Medication Sig Dispense Refill  . albuterol (PROVENTIL HFA;VENTOLIN HFA) 108 (90 Base) MCG/ACT inhaler Inhale 1 puff into the lungs every 6 (six) hours as needed for wheezing or shortness of breath.    . allopurinol (ZYLOPRIM) 100 MG tablet TAKE 1 TABLET BY MOUTH  DAILY 90 tablet 1  . ANORO ELLIPTA 62.5-25 MCG/INH AEPB Inhale 1 puff into the lungs daily. (Patient taking differently: Inhale 1 puff into the lungs daily as needed. ) 1 each 6  . Coenzyme Q10 (CO Q 10 PO) Take by mouth daily.    Marland Kitchen conjugated estrogens (PREMARIN) vaginal cream Place 1 Applicatorful vaginally 2 (two) times a week. 42.5 g 12  . ezetimibe (ZETIA) 10 MG tablet TAKE 1 TABLET BY MOUTH  DAILY 90 tablet 3  . fluticasone (FLONASE) 50 MCG/ACT nasal spray USE 2 SPRAYS IN EACH  NOSTRIL DAILY 48 g 3  . hydrochlorothiazide (MICROZIDE) 12.5 MG capsule Take 1 capsule (12.5 mg total) by mouth daily. 90 capsule 1  . Lactulose 20 GM/30ML SOLN 30 ml every 4 hours until constipation is relieved 236 mL 3  . losartan (COZAAR) 100 MG tablet Take 1 tablet (100 mg total) by mouth daily. 90 tablet 1  . meloxicam (MOBIC) 7.5 MG tablet Take 7.5 mg by mouth as needed for pain.    . metoprolol succinate (TOPROL-XL) 25 MG 24 hr tablet TAKE 1 TABLET BY MOUTH  DAILY 90 tablet 3  . ondansetron (ZOFRAN) 4 MG tablet Take 1 tablet (4 mg total) by mouth every 8 (eight) hours as needed for nausea or vomiting. 20 tablet 0  . pantoprazole (PROTONIX) 40 MG  tablet TAKE 1 TABLET BY MOUTH  DAILY 90 tablet 1  . sertraline (ZOLOFT) 100 MG tablet TAKE 1 TABLET BY MOUTH  EVERY DAY 90 tablet 3  . simvastatin (ZOCOR) 40 MG tablet Take 1 tablet (40 mg total) by mouth at bedtime. 90 tablet 1  . traMADol (ULTRAM) 50 MG tablet Take 1 tablet (50 mg total) by mouth every 8 (eight) hours as needed. 30 tablet 0   No current facility-administered medications for this visit.     Allergies  Allergen Reactions  . Biaxin [Clarithromycin]   . Corn-Containing Products   . Dust Mite Extract     Almonds,Mold Spores  . Gluten Meal   . Oat   . Shrimp [Shellfish Allergy]     Indication: Patient presents with symptomatic varicose veins of the left lower extremity.  Procedure: Foam sclerotherapy was performed on the left lower extremity. Using ultrasound guidance, 5 mL of foam Sotradecol was used to inject the varicosities of the left lower extremity. Compression wraps were placed. The patient tolerated the procedure well.

## 2018-10-26 DIAGNOSIS — M5416 Radiculopathy, lumbar region: Secondary | ICD-10-CM | POA: Diagnosis not present

## 2018-10-30 ENCOUNTER — Ambulatory Visit (INDEPENDENT_AMBULATORY_CARE_PROVIDER_SITE_OTHER): Payer: Medicare Other | Admitting: Family Medicine

## 2018-10-30 ENCOUNTER — Other Ambulatory Visit: Payer: Self-pay

## 2018-10-30 DIAGNOSIS — M5030 Other cervical disc degeneration, unspecified cervical region: Secondary | ICD-10-CM

## 2018-10-30 DIAGNOSIS — M542 Cervicalgia: Secondary | ICD-10-CM | POA: Diagnosis not present

## 2018-10-30 MED ORDER — TRAMADOL HCL 50 MG PO TABS: 50.0000 mg | ORAL_TABLET | Freq: Three times a day (TID) | ORAL | 0 refills | Status: DC | PRN

## 2018-10-30 MED ORDER — METHYLPREDNISOLONE 4 MG PO TBPK: ORAL_TABLET | ORAL | 0 refills | Status: DC

## 2018-10-30 NOTE — Progress Notes (Signed)
Patient ID: Jamie Anderson, female   DOB: February 25, 1936, 83 y.o.   MRN: [id number]    Virtual Visit via phone Note  This visit type was conducted due to national recommendations for restrictions regarding the COVID-19 pandemic (e.g. social distancing).  This format is felt to be most appropriate for this patient at this time.  All issues noted in this document were discussed and addressed.  No physical exam was performed (except for noted visual exam findings with Video Visits).   I connected with Julious Oka today at 11:00 AM EDT by a video enabled telemedicine application or telephone and verified that I am speaking with the correct person using two identifiers. Location patient: home Location provider: work or home office Persons participating in the virtual visit: patient, provider  I discussed the limitations, risks, security and privacy concerns of performing an evaluation and management service by telephone and the availability of in person appointments. I also discussed with the patient that there may be a patient responsible charge related to this service. The patient expressed understanding and agreed to proceed.  HPI:   Patient and I connected via phone due to complaint of neck pain.   She does have hx of spinal stenosis in lumbar region and recently had injections in lumbar spine to treat.   Also has hx of neck pain.  She did have cervical MRI in 2011 with results as follows:   C5-6 and C6-7 annular bulge with bilateral neuroforaminal  narrowing. The neuroforaminal narrowing is most prominent at C6-7 on the  left. Small disc protrusions may be associated with annular bulges at C5-6  and C6-7. No large disc protrusions are noted. The cervical cord is  unremarkable.   Patient has been using Tylenol as needed, and did take a tramadol yesterday due to more severe neck pain.  Tries to use tramadol sparingly, this tramadol prescription was from back in January 2020 for 30 tablets.   Denies any new injury to neck, but states she has been doing a lot of puzzles at her home, so most likely has been sitting in a position that strained the neck.  Has been doing stretching exercises to help soothe the neck with minimal relief.  Has tried heating pad on neck without much help in pain reduction.  Patient states "nothing is making this pain go away in my neck completely"  Denies numbness or tingling in extremities.  Denies loss of grip or arm weakness.  No fevers or chills. States her neck just feels stiff and is painful to turn from side to side.   ROS: See pertinent positives and negatives per HPI.  Past Medical History:  Diagnosis Date  . Granuloma annulare   . Hyperlipidemia   . Hypertension   . Menopause   . Plantar fasciitis of right foot 12/24/2014    Past Surgical History:  Procedure Laterality Date  . APPENDECTOMY    . DILATION AND CURETTAGE OF UTERUS    . RIGHT OOPHORECTOMY Right feb 2014   [name]  . [place]  2010  . VENOUS ABLATION     bilateral    Family History  Problem Relation Age of Onset  . Early death Mother        durig childbirth  . Heart disease Father   . Heart attack Father   . Heart disease Sister   . Cancer Brother   . Heart disease Brother        Afib   Social History  Tobacco Use  . Smoking status: Never Smoker  . Smokeless tobacco: Never Used  Substance Use Topics  . Alcohol use: No    Alcohol/week: 4.0 standard drinks    Types: 4 Glasses of wine per week    Frequency: Never    Current Outpatient Medications:  .  albuterol (PROVENTIL HFA;VENTOLIN HFA) 108 (90 Base) MCG/ACT inhaler, Inhale 1 puff into the lungs every 6 (six) hours as needed for wheezing or shortness of breath., Disp: , Rfl:  .  allopurinol (ZYLOPRIM) 100 MG tablet, TAKE 1 TABLET BY MOUTH  DAILY, Disp: 90 tablet, Rfl: 1 .  ANORO ELLIPTA 62.5-25 MCG/INH AEPB, Inhale 1 puff into the lungs daily. (Patient taking differently: Inhale 1 puff into the  lungs daily as needed. ), Disp: 1 each, Rfl: 6 .  Coenzyme Q10 (CO Q 10 PO), Take by mouth daily., Disp: , Rfl:  .  conjugated estrogens (PREMARIN) vaginal cream, Place 1 Applicatorful vaginally 2 (two) times a week., Disp: 42.5 g, Rfl: 12 .  ezetimibe (ZETIA) 10 MG tablet, TAKE 1 TABLET BY MOUTH  DAILY, Disp: 90 tablet, Rfl: 3 .  fluticasone (FLONASE) 50 MCG/ACT nasal spray, USE 2 SPRAYS IN EACH  NOSTRIL DAILY, Disp: 48 g, Rfl: 3 .  hydrochlorothiazide (MICROZIDE) 12.5 MG capsule, Take 1 capsule (12.5 mg total) by mouth daily., Disp: 90 capsule, Rfl: 1 .  Lactulose 20 GM/30ML SOLN, 30 ml every 4 hours until constipation is relieved, Disp: 236 mL, Rfl: 3 .  losartan (COZAAR) 100 MG tablet, Take 1 tablet (100 mg total) by mouth daily., Disp: 90 tablet, Rfl: 1 .  meloxicam (MOBIC) 7.5 MG tablet, Take 7.5 mg by mouth as needed for pain., Disp: , Rfl:  .  metoprolol succinate (TOPROL-XL) 25 MG 24 hr tablet, TAKE 1 TABLET BY MOUTH  DAILY, Disp: 90 tablet, Rfl: 3 .  ondansetron (ZOFRAN) 4 MG tablet, Take 1 tablet (4 mg total) by mouth every 8 (eight) hours as needed for nausea or vomiting., Disp: 20 tablet, Rfl: 0 .  pantoprazole (PROTONIX) 40 MG tablet, TAKE 1 TABLET BY MOUTH  DAILY, Disp: 90 tablet, Rfl: 1 .  sertraline (ZOLOFT) 100 MG tablet, TAKE 1 TABLET BY MOUTH  EVERY DAY, Disp: 90 tablet, Rfl: 3 .  simvastatin (ZOCOR) 40 MG tablet, Take 1 tablet (40 mg total) by mouth at bedtime., Disp: 90 tablet, Rfl: 1 .  traMADol (ULTRAM) 50 MG tablet, Take 1 tablet (50 mg total) by mouth every 8 (eight) hours as needed., Disp: 30 tablet, Rfl: 0  EXAM:  GENERAL: alert, oriented,sounds well and in no acute distress  LUNGS: Speaking in full sentences, no signs of respiratory distress, breathing rate appears normal, no obvious gross SOB, gasping, coughing or wheezing  PSYCH/NEURO: pleasant and cooperative, no obvious depression or anxiety, speech and thought processing grossly intact  ASSESSMENT AND PLAN:   Discussed the following assessment and plan:  Chronic neck pain/DDD and neck  -  Long discussion with patient regards to need for new MRI imaging.  Also long discussion with patient regards to how he may never be able to have her pain level be 0, but our goal is to decrease the pain and increase her functionality.  Advised to continue doing stretches at least 2-3 times a day, use topical rubs on neck like BenGay or Biofreeze and use heating pad as needed to soothe pain.  She will take oral steroid taper to help reduce pain and inflammation and she will use tramadol as  needed.  She also will use tramadol as needed, advised no more than 3000 mg of Tylenol in 24 hour period. [place] PMP registry checked and is appropriate for Rx of tramadol.    Patient aware that someone will contact her regards to MRI appointment.  Offered referral to physical therapy, declines at this time and prefer not to have to drive anywhere and rather just do exercises at home.   I discussed the assessment and treatment plan with the patient. The patient was provided an opportunity to ask questions and all were answered. The patient agreed with the plan and demonstrated an understanding of the instructions.   The patient was advised to call back or seek an in-person evaluation if the symptoms worsen or if the condition fails to improve as anticipated.  I provided 23 minutes of non-face-to-face time over phone during this encounter.   Jodelle Green, FNP

## 2018-11-01 ENCOUNTER — Ambulatory Visit (INDEPENDENT_AMBULATORY_CARE_PROVIDER_SITE_OTHER): Payer: Medicare Other | Admitting: Vascular Surgery

## 2018-11-10 ENCOUNTER — Telehealth: Payer: Self-pay | Admitting: Internal Medicine

## 2018-11-10 NOTE — Telephone Encounter (Signed)
Pt called in to ask if PCP would send in something for UTI? Pt says that she has frequent urination.  Pharmacy:  [facility], [place] [phone] (Phone) [phone] (Fax)

## 2018-11-10 NOTE — Telephone Encounter (Signed)
Called pt and spoke with her to let her know that she will need to go to [facility] to give a urine sample to check for a UTI.

## 2018-11-14 ENCOUNTER — Ambulatory Visit (INDEPENDENT_AMBULATORY_CARE_PROVIDER_SITE_OTHER): Payer: Medicare Other | Admitting: Vascular Surgery

## 2018-11-14 ENCOUNTER — Encounter (INDEPENDENT_AMBULATORY_CARE_PROVIDER_SITE_OTHER): Payer: Self-pay | Admitting: Vascular Surgery

## 2018-11-14 ENCOUNTER — Ambulatory Visit
Admission: RE | Admit: 2018-11-14 | Discharge: 2018-11-14 | Disposition: A | Discharge status: Home / Self Care | Payer: Medicare Other | Source: Ambulatory Visit | Attending: Family Medicine | Admitting: Family Medicine

## 2018-11-14 ENCOUNTER — Other Ambulatory Visit: Payer: Self-pay

## 2018-11-14 VITALS — BP 145/74 | HR 69 | Resp 16 | Wt 193.0 lb

## 2018-11-14 DIAGNOSIS — M5030 Other cervical disc degeneration, unspecified cervical region: Secondary | ICD-10-CM | POA: Insufficient documentation

## 2018-11-14 DIAGNOSIS — M542 Cervicalgia: Secondary | ICD-10-CM | POA: Insufficient documentation

## 2018-11-14 DIAGNOSIS — I83892 Varicose veins of left lower extremities with other complications: Secondary | ICD-10-CM

## 2018-11-14 DIAGNOSIS — I83893 Varicose veins of bilateral lower extremities with other complications: Secondary | ICD-10-CM

## 2018-11-14 NOTE — Progress Notes (Signed)
  Jamie Anderson is a 83 y.o.female who presents with painful varicose veins of the left leg  Past Medical History:  Diagnosis Date  . Granuloma annulare   . Hyperlipidemia   . Hypertension   . Menopause   . Plantar fasciitis of right foot 12/24/2014    Past Surgical History:  Procedure Laterality Date  . APPENDECTOMY    . DILATION AND CURETTAGE OF UTERUS    . RIGHT OOPHORECTOMY Right feb 2014   [name]  . [place]  2010  . VENOUS ABLATION     bilateral    Current Outpatient Medications  Medication Sig Dispense Refill  . albuterol (PROVENTIL HFA;VENTOLIN HFA) 108 (90 Base) MCG/ACT inhaler Inhale 1 puff into the lungs every 6 (six) hours as needed for wheezing or shortness of breath.    . allopurinol (ZYLOPRIM) 100 MG tablet TAKE 1 TABLET BY MOUTH  DAILY 90 tablet 1  . ANORO ELLIPTA 62.5-25 MCG/INH AEPB Inhale 1 puff into the lungs daily. (Patient taking differently: Inhale 1 puff into the lungs daily as needed. ) 1 each 6  . Coenzyme Q10 (CO Q 10 PO) Take by mouth daily.    Marland Kitchen conjugated estrogens (PREMARIN) vaginal cream Place 1 Applicatorful vaginally 2 (two) times a week. 42.5 g 12  . ezetimibe (ZETIA) 10 MG tablet TAKE 1 TABLET BY MOUTH  DAILY 90 tablet 3  . fluticasone (FLONASE) 50 MCG/ACT nasal spray USE 2 SPRAYS IN EACH  NOSTRIL DAILY 48 g 3  . hydrochlorothiazide (MICROZIDE) 12.5 MG capsule Take 1 capsule (12.5 mg total) by mouth daily. 90 capsule 1  . Lactulose 20 GM/30ML SOLN 30 ml every 4 hours until constipation is relieved 236 mL 3  . losartan (COZAAR) 100 MG tablet Take 1 tablet (100 mg total) by mouth daily. 90 tablet 1  . meloxicam (MOBIC) 7.5 MG tablet Take 7.5 mg by mouth as needed for pain.    . methylPREDNISolone (MEDROL DOSEPAK) 4 MG TBPK tablet Take according to pack instructions 21 tablet 0  . metoprolol succinate (TOPROL-XL) 25 MG 24 hr tablet TAKE 1 TABLET BY MOUTH  DAILY 90 tablet 3  . ondansetron (ZOFRAN) 4 MG tablet Take 1 tablet (4 mg total) by  mouth every 8 (eight) hours as needed for nausea or vomiting. 20 tablet 0  . pantoprazole (PROTONIX) 40 MG tablet TAKE 1 TABLET BY MOUTH  DAILY 90 tablet 1  . sertraline (ZOLOFT) 100 MG tablet TAKE 1 TABLET BY MOUTH  EVERY DAY 90 tablet 3  . simvastatin (ZOCOR) 40 MG tablet Take 1 tablet (40 mg total) by mouth at bedtime. 90 tablet 1  . traMADol (ULTRAM) 50 MG tablet Take 1 tablet (50 mg total) by mouth every 8 (eight) hours as needed. 15 tablet 0   No current facility-administered medications for this visit.     Allergies  Allergen Reactions  . Biaxin [Clarithromycin]   . Corn-Containing Products   . Dust Mite Extract     Almonds,Mold Spores  . Gluten Meal   . Oat   . Shrimp [Shellfish Allergy]     Indication: Patient presents with symptomatic varicose veins of the left lower extremity.  Procedure: Foam sclerotherapy was performed on the left lower extremity. Using ultrasound guidance, 5 mL of foam Sotradecol was used to inject the varicosities of the left lower extremity. Compression wraps were placed. The patient tolerated the procedure well.

## 2018-11-15 NOTE — Addendum Note (Signed)
Addended by: Philis Nettle on: 11/15/2018 01:33 PM   Modules accepted: Orders

## 2018-11-20 ENCOUNTER — Telehealth: Payer: Self-pay

## 2018-11-20 NOTE — Telephone Encounter (Signed)
Copied from [place] [id number]. Topic: General - Other >> Nov 20, 2018  3:13 PM Jamie Anderson, [place] C wrote: Reason for CRM: pt says that she has had 2 MRI and was Dx with arthritis. Pt would like to know what should she do from here?   CB: [phone]

## 2018-11-20 NOTE — Telephone Encounter (Signed)
Please forward to Philis Nettle, not sure if she has given her the results of the cervical spine MRI she ordered .  Offer her an appointment to discuss goals of care

## 2018-11-21 NOTE — Telephone Encounter (Signed)
Please let patient know -- we did put in referral to neurosurgery for eval of her neck pain and see what we can do for treatment. Her treatment may be similar to what they do for her lower back. She can use tylenol as needed and topical rubs like bengay, biofreeze and aspercreme   Notes recorded by Jodelle Green, FNP on 11/15/2018 Neurosurgery referral in  ------   Notes recorded by Neta Ehlers, RMA on 11/15/2018 at 1:24 PM EDT  Patient was informed of results. Patient stated she understood and had no questions. Pt agreed to seeing a specialist for her neck pain.  ------   Notes recorded by Jodelle Green, FNP on 11/15/2018 at 12:33 PM EDT  Please advise: neck xray does show stenosis in the neck bones which explains her pain. Spinal stenosis is most commonly caused by wear-and-tear changes in the spine related to osteoarthritis. She sees Dr Nelva Bush for her low back, we can refer her for her neck pain also if she would like

## 2018-11-21 NOTE — Telephone Encounter (Signed)
Called Pt and gave her the information from NP Lauren Guse. Pt stated she understood and would try the different things for her neck pain.

## 2018-11-28 ENCOUNTER — Other Ambulatory Visit: Payer: Self-pay | Admitting: Internal Medicine

## 2018-11-28 ENCOUNTER — Other Ambulatory Visit: Payer: Self-pay | Admitting: Cardiovascular Disease

## 2018-11-28 ENCOUNTER — Ambulatory Visit (INDEPENDENT_AMBULATORY_CARE_PROVIDER_SITE_OTHER): Payer: Medicare Other | Admitting: Vascular Surgery

## 2018-12-12 ENCOUNTER — Ambulatory Visit (INDEPENDENT_AMBULATORY_CARE_PROVIDER_SITE_OTHER): Payer: Medicare Other | Admitting: Vascular Surgery

## 2018-12-12 ENCOUNTER — Encounter (INDEPENDENT_AMBULATORY_CARE_PROVIDER_SITE_OTHER): Payer: Self-pay | Admitting: Vascular Surgery

## 2018-12-12 ENCOUNTER — Other Ambulatory Visit: Payer: Self-pay

## 2018-12-12 VITALS — BP 165/76 | HR 85 | Resp 10 | Ht 64.0 in | Wt 195.0 lb

## 2018-12-12 DIAGNOSIS — I83893 Varicose veins of bilateral lower extremities with other complications: Secondary | ICD-10-CM

## 2018-12-12 DIAGNOSIS — I83891 Varicose veins of right lower extremities with other complications: Secondary | ICD-10-CM | POA: Diagnosis not present

## 2018-12-12 NOTE — Progress Notes (Signed)
Jamie Anderson is a 83 y.o.female who presents with painful varicose veins of the right leg  Past Medical History:  Diagnosis Date  . Granuloma annulare   . Hyperlipidemia   . Hypertension   . Menopause   . Plantar fasciitis of right foot 12/24/2014    Past Surgical History:  Procedure Laterality Date  . APPENDECTOMY    . DILATION AND CURETTAGE OF UTERUS    . RIGHT OOPHORECTOMY Right feb 2014   [name]  . [place]  2010  . VENOUS ABLATION     bilateral    Current Outpatient Medications  Medication Sig Dispense Refill  . albuterol (PROVENTIL HFA;VENTOLIN HFA) 108 (90 Base) MCG/ACT inhaler Inhale 1 puff into the lungs every 6 (six) hours as needed for wheezing or shortness of breath.    . allopurinol (ZYLOPRIM) 100 MG tablet TAKE 1 TABLET BY MOUTH  DAILY 90 tablet 1  . ANORO ELLIPTA 62.5-25 MCG/INH AEPB Inhale 1 puff into the lungs daily. (Patient taking differently: Inhale 1 puff into the lungs daily as needed. ) 1 each 6  . Coenzyme Q10 (CO Q 10 PO) Take by mouth daily.    Marland Kitchen conjugated estrogens (PREMARIN) vaginal cream Place 1 Applicatorful vaginally 2 (two) times a week. 42.5 g 12  . ezetimibe (ZETIA) 10 MG tablet TAKE 1 TABLET BY MOUTH  DAILY 90 tablet 3  . fluticasone (FLONASE) 50 MCG/ACT nasal spray USE 2 SPRAYS IN EACH  NOSTRIL DAILY 48 g 3  . gabapentin (NEURONTIN) 300 MG capsule     . hydrochlorothiazide (MICROZIDE) 12.5 MG capsule TAKE 1 CAPSULE BY MOUTH  DAILY 90 capsule 3  . Lactulose 20 GM/30ML SOLN 30 ml every 4 hours until constipation is relieved 236 mL 3  . losartan (COZAAR) 100 MG tablet TAKE 1 TABLET BY MOUTH  DAILY 90 tablet 3  . meloxicam (MOBIC) 7.5 MG tablet Take 7.5 mg by mouth as needed for pain.    . methylPREDNISolone (MEDROL DOSEPAK) 4 MG TBPK tablet Take according to pack instructions 21 tablet 0  . metoprolol succinate (TOPROL-XL) 25 MG 24 hr tablet TAKE 1 TABLET BY MOUTH  DAILY 90 tablet 3  . ondansetron (ZOFRAN) 4 MG tablet Take 1 tablet (4 mg  total) by mouth every 8 (eight) hours as needed for nausea or vomiting. 20 tablet 0  . pantoprazole (PROTONIX) 40 MG tablet TAKE 1 TABLET BY MOUTH  DAILY 90 tablet 3  . sertraline (ZOLOFT) 100 MG tablet TAKE 1 TABLET BY MOUTH  EVERY DAY 90 tablet 3  . simvastatin (ZOCOR) 40 MG tablet TAKE 1 TABLET BY MOUTH AT  BEDTIME 90 tablet 1  . traMADol (ULTRAM) 50 MG tablet Take 1 tablet (50 mg total) by mouth every 8 (eight) hours as needed. 15 tablet 0   No current facility-administered medications for this visit.     Allergies  Allergen Reactions  . Biaxin [Clarithromycin]   . Corn-Containing Products   . Dust Mite Extract     Almonds,Mold Spores  . Gluten Meal   . Oat   . Shrimp [Shellfish Allergy]     Indication: Patient presents with symptomatic varicose veins of the right lower extremity.  Procedure: Foam sclerotherapy was performed on the right lower extremity. Using ultrasound guidance, 5 mL of foam Sotradecol was used to inject the varicosities of the right lower extremity. Compression wraps were placed. The patient tolerated the procedure well.

## 2018-12-13 ENCOUNTER — Other Ambulatory Visit: Payer: Self-pay | Admitting: Internal Medicine

## 2018-12-13 DIAGNOSIS — M4802 Spinal stenosis, cervical region: Secondary | ICD-10-CM

## 2018-12-13 NOTE — Progress Notes (Signed)
Patient requested referral to Dr Nelva Bush at [facility] ,for her neck pain.   She has seen him before,  For low back pain .

## 2018-12-26 ENCOUNTER — Other Ambulatory Visit: Payer: Self-pay

## 2018-12-26 ENCOUNTER — Encounter (INDEPENDENT_AMBULATORY_CARE_PROVIDER_SITE_OTHER): Payer: Self-pay | Admitting: Vascular Surgery

## 2018-12-26 ENCOUNTER — Ambulatory Visit (INDEPENDENT_AMBULATORY_CARE_PROVIDER_SITE_OTHER): Payer: Medicare Other | Admitting: Vascular Surgery

## 2018-12-26 VITALS — BP 159/76 | HR 80 | Resp 12 | Ht 64.0 in | Wt 189.0 lb

## 2018-12-26 DIAGNOSIS — I83893 Varicose veins of bilateral lower extremities with other complications: Secondary | ICD-10-CM

## 2018-12-26 DIAGNOSIS — I83891 Varicose veins of right lower extremities with other complications: Secondary | ICD-10-CM

## 2018-12-26 NOTE — Progress Notes (Signed)
Jamie Anderson is a 83 y.o.female who presents with painful varicose veins of the right leg  Past Medical History:  Diagnosis Date  . Granuloma annulare   . Hyperlipidemia   . Hypertension   . Menopause   . Plantar fasciitis of right foot 12/24/2014    Past Surgical History:  Procedure Laterality Date  . APPENDECTOMY    . DILATION AND CURETTAGE OF UTERUS    . RIGHT OOPHORECTOMY Right feb 2014   [name]  . [place]  2010  . VENOUS ABLATION     bilateral    Current Outpatient Medications  Medication Sig Dispense Refill  . albuterol (PROVENTIL HFA;VENTOLIN HFA) 108 (90 Base) MCG/ACT inhaler Inhale 1 puff into the lungs every 6 (six) hours as needed for wheezing or shortness of breath.    . allopurinol (ZYLOPRIM) 100 MG tablet TAKE 1 TABLET BY MOUTH  DAILY 90 tablet 1  . ANORO ELLIPTA 62.5-25 MCG/INH AEPB Inhale 1 puff into the lungs daily. (Patient taking differently: Inhale 1 puff into the lungs daily as needed. ) 1 each 6  . Coenzyme Q10 (CO Q 10 PO) Take by mouth daily.    Marland Kitchen conjugated estrogens (PREMARIN) vaginal cream Place 1 Applicatorful vaginally 2 (two) times a week. 42.5 g 12  . ezetimibe (ZETIA) 10 MG tablet TAKE 1 TABLET BY MOUTH  DAILY 90 tablet 3  . fluticasone (FLONASE) 50 MCG/ACT nasal spray USE 2 SPRAYS IN EACH  NOSTRIL DAILY 48 g 3  . gabapentin (NEURONTIN) 300 MG capsule     . hydrochlorothiazide (MICROZIDE) 12.5 MG capsule TAKE 1 CAPSULE BY MOUTH  DAILY 90 capsule 3  . Lactulose 20 GM/30ML SOLN 30 ml every 4 hours until constipation is relieved 236 mL 3  . losartan (COZAAR) 100 MG tablet TAKE 1 TABLET BY MOUTH  DAILY 90 tablet 3  . meloxicam (MOBIC) 7.5 MG tablet Take 7.5 mg by mouth as needed for pain.    . methylPREDNISolone (MEDROL DOSEPAK) 4 MG TBPK tablet Take according to pack instructions 21 tablet 0  . metoprolol succinate (TOPROL-XL) 25 MG 24 hr tablet TAKE 1 TABLET BY MOUTH  DAILY 90 tablet 3  . ondansetron (ZOFRAN) 4 MG tablet Take 1 tablet (4 mg  total) by mouth every 8 (eight) hours as needed for nausea or vomiting. 20 tablet 0  . pantoprazole (PROTONIX) 40 MG tablet TAKE 1 TABLET BY MOUTH  DAILY 90 tablet 3  . sertraline (ZOLOFT) 100 MG tablet TAKE 1 TABLET BY MOUTH  EVERY DAY 90 tablet 3  . simvastatin (ZOCOR) 40 MG tablet TAKE 1 TABLET BY MOUTH AT  BEDTIME 90 tablet 1  . traMADol (ULTRAM) 50 MG tablet Take 1 tablet (50 mg total) by mouth every 8 (eight) hours as needed. 15 tablet 0   No current facility-administered medications for this visit.     Allergies  Allergen Reactions  . Biaxin [Clarithromycin]   . Corn-Containing Products   . Dust Mite Extract     Almonds,Mold Spores  . Gluten Meal   . Oat   . Shrimp [Shellfish Allergy]     Indication: Patient presents with symptomatic varicose veins of the right lower extremity.  Procedure: Foam sclerotherapy was performed on the right lower extremity. Using ultrasound guidance, 5 mL of foam Sotradecol was used to inject the varicosities of the right lower extremity. Compression wraps were placed. The patient tolerated the procedure well.

## 2019-01-02 ENCOUNTER — Other Ambulatory Visit: Payer: Self-pay | Admitting: Internal Medicine

## 2019-01-02 DIAGNOSIS — L820 Inflamed seborrheic keratosis: Secondary | ICD-10-CM | POA: Diagnosis not present

## 2019-01-02 DIAGNOSIS — L853 Xerosis cutis: Secondary | ICD-10-CM | POA: Diagnosis not present

## 2019-01-02 DIAGNOSIS — L821 Other seborrheic keratosis: Secondary | ICD-10-CM | POA: Diagnosis not present

## 2019-01-02 DIAGNOSIS — Z85828 Personal history of other malignant neoplasm of skin: Secondary | ICD-10-CM | POA: Diagnosis not present

## 2019-01-02 DIAGNOSIS — L920 Granuloma annulare: Secondary | ICD-10-CM | POA: Diagnosis not present

## 2019-01-02 DIAGNOSIS — N950 Postmenopausal bleeding: Secondary | ICD-10-CM

## 2019-01-09 ENCOUNTER — Encounter: Payer: Self-pay | Admitting: Family Medicine

## 2019-01-09 ENCOUNTER — Ambulatory Visit (INDEPENDENT_AMBULATORY_CARE_PROVIDER_SITE_OTHER): Payer: Medicare Other | Admitting: Family Medicine

## 2019-01-09 ENCOUNTER — Other Ambulatory Visit: Payer: Self-pay

## 2019-01-09 VITALS — BP 138/62 | HR 84 | Temp 98.0°F | Ht 64.0 in | Wt 192.8 lb

## 2019-01-09 DIAGNOSIS — R300 Dysuria: Secondary | ICD-10-CM

## 2019-01-09 DIAGNOSIS — N390 Urinary tract infection, site not specified: Secondary | ICD-10-CM | POA: Diagnosis not present

## 2019-01-09 LAB — POCT URINALYSIS DIPSTICK
Bilirubin, UA: NEGATIVE
Blood, UA: POSITIVE
Glucose, UA: NEGATIVE
Ketones, UA: NEGATIVE
Nitrite, UA: NEGATIVE
Protein, UA: POSITIVE — AB
Spec Grav, UA: 1.025 (ref 1.010–1.025)
Urobilinogen, UA: NEGATIVE E.U./dL — AB
pH, UA: 5.5 (ref 5.0–8.0)

## 2019-01-09 MED ORDER — CEFDINIR 300 MG PO CAPS: 300.0000 mg | ORAL_CAPSULE | Freq: Two times a day (BID) | ORAL | 0 refills | Status: DC

## 2019-01-09 NOTE — Progress Notes (Signed)
Subjective:    Patient ID: Jamie Anderson, female    DOB: 09/18/1935, 83 y.o.   MRN: [id number]  HPI  Patient presents to clinic complaining of dysuria, increased pressure and frequency with urination for the past 2 or 3 days.  Patient concerned for possible UTI.  No fever or chills.  No severe abdominal pain.  No vomiting or diarrhea.  Patient Active Problem List   Diagnosis Date Noted  . Varicose veins of both lower extremities with complications [id number]  . Degenerative lumbar spinal stenosis 03/15/2018  . CKD (chronic kidney disease) stage 3, GFR 30-59 ml/min 03/15/2018  . Intermittent right lower quadrant abdominal pain 07/31/2017  . Atypical chest pain 01/22/2017  . Hilar adenopathy 01/22/2017  . Aortic atherosclerosis ([place]) 01/22/2017  . Post-menopausal atrophic vaginitis 01/18/2016  . Encounter for preventive health examination 01/18/2016  . Dysuria 04/10/2015  . Colon cancer screening 01/16/2015  . Numbness and tingling of right arm 01/16/2015  . Obesity 12/31/2012  . Gluten-sensitive enteropathy 12/29/2012  . Obsessive compulsive disorder 12/29/2012  . Cystic teratoma of right ovary 11/23/2012  . Gout 04/13/2012  . Right knee DJD 04/03/2012  . Medicare annual wellness visit, subsequent 11/26/2011  . Hyperlipidemia LDL goal <130 05/25/2011  . Hypertension 05/25/2011   Social History   Tobacco Use  . Smoking status: Never Smoker  . Smokeless tobacco: Never Used  Substance Use Topics  . Alcohol use: No    Alcohol/week: 4.0 standard drinks    Types: 4 Glasses of wine per week    Frequency: Never     Review of Systems  Constitutional: Negative for chills, fatigue and fever.  HENT: Negative for congestion, ear pain, sinus pain and sore throat.   Eyes: Negative.   Respiratory: Negative for cough, shortness of breath and wheezing.   Cardiovascular: Negative for chest pain, palpitations and leg swelling.  Gastrointestinal: Negative for abdominal pain,  diarrhea, nausea and vomiting.  Genitourinary: +dysuria, frequency and urgency.  Musculoskeletal: Negative for arthralgias and myalgias.  Skin: Negative for color change, pallor and rash.  Neurological: Negative for syncope, light-headedness and headaches.  Psychiatric/Behavioral: The patient is not nervous/anxious.       Objective:   Physical Exam Vitals signs and nursing note reviewed.  Constitutional:      General: She is not in acute distress.    Appearance: She is obese. She is not toxic-appearing.  HENT:     Head: Normocephalic and atraumatic.  Cardiovascular:     Rate and Rhythm: Normal rate and regular rhythm.  Pulmonary:     Effort: Pulmonary effort is normal. No respiratory distress.     Breath sounds: Normal breath sounds.  Abdominal:     General: Bowel sounds are normal.     Palpations: Abdomen is soft.     Tenderness: There is abdominal tenderness (mild suprapubic tenderness).  Skin:    General: Skin is warm and dry.     Coloration: Skin is not jaundiced or pale.  Neurological:     Mental Status: She is alert and oriented to person, place, and time.  Psychiatric:        Mood and Affect: Mood normal.        Behavior: Behavior normal.    Today's Vitals   01/09/19 1423  BP: 138/62  Pulse: 84  Temp: 98 F (36.7 C)  SpO2: 99%  Weight: 192 lb 12.8 oz (87.5 kg)  Height: 5\' 4"  (1.626 m)   Body mass index is 33.09 kg/m.  Assessment & Plan:    UTI-patient does have positive bacteria on urinalysis.  This examination with her symptoms do seem consistent with UTI.  We will treat her with Omnicef twice daily for 5 days.  Urine culture sent to lab.  Patient will keep all regular follow-ups PCP as planned return to clinic sooner if any issues arise.

## 2019-01-09 NOTE — Patient Instructions (Signed)
Urinary Tract Infection, Adult A urinary tract infection (UTI) is an infection of any part of the urinary tract. The urinary tract includes:  The kidneys.  The ureters.  The bladder.  The urethra. These organs make, store, and get rid of pee (urine) in the body. What are the causes? This is caused by germs (bacteria) in your genital area. These germs grow and cause swelling (inflammation) of your urinary tract. What increases the risk? You are more likely to develop this condition if:  You have a small, thin tube (catheter) to drain pee.  You cannot control when you pee or poop (incontinence).  You are female, and: ? You use these methods to prevent pregnancy: ? A medicine that kills sperm (spermicide). ? A device that blocks sperm (diaphragm). ? You have low levels of a female hormone (estrogen). ? You are pregnant.  You have genes that add to your risk.  You are sexually active.  You take antibiotic medicines.  You have trouble peeing because of: ? A prostate that is bigger than normal, if you are female. ? A blockage in the part of your body that drains pee from the bladder (urethra). ? A kidney stone. ? A nerve condition that affects your bladder (neurogenic bladder). ? Not getting enough to drink. ? Not peeing often enough.  You have other conditions, such as: ? Diabetes. ? A weak disease-fighting system (immune system). ? Sickle cell disease. ? Gout. ? Injury of the spine. What are the signs or symptoms? Symptoms of this condition include:  Needing to pee right away (urgently).  Peeing often.  Peeing small amounts often.  Pain or burning when peeing.  Blood in the pee.  Pee that smells bad or not like normal.  Trouble peeing.  Pee that is cloudy.  Fluid coming from the vagina, if you are female.  Pain in the belly or lower back. Other symptoms include:  Throwing up (vomiting).  No urge to eat.  Feeling mixed up (confused).  Being tired  and grouchy (irritable).  A fever.  Watery poop (diarrhea). How is this treated? This condition may be treated with:  Antibiotic medicine.  Other medicines.  Drinking enough water. Follow these instructions at home:  Medicines  Take over-the-counter and prescription medicines only as told by your doctor.  If you were prescribed an antibiotic medicine, take it as told by your doctor. Do not stop taking it even if you start to feel better. General instructions  Make sure you: ? Pee until your bladder is empty. ? Do not hold pee for a long time. ? Empty your bladder after sex. ? Wipe from front to back after pooping if you are a female. Use each tissue one time when you wipe.  Drink enough fluid to keep your pee pale yellow.  Keep all follow-up visits as told by your doctor. This is important. Contact a doctor if:  You do not get better after 1-2 days.  Your symptoms go away and then come back. Get help right away if:  You have very bad back pain.  You have very bad pain in your lower belly.  You have a fever.  You are sick to your stomach (nauseous).  You are throwing up. Summary  A urinary tract infection (UTI) is an infection of any part of the urinary tract.  This condition is caused by germs in your genital area.  There are many risk factors for a UTI. These include having a small, thin   tube to drain pee and not being able to control when you pee or poop.  Treatment includes antibiotic medicines for germs.  Drink enough fluid to keep your pee pale yellow. This information is not intended to replace advice given to you by your health care provider. Make sure you discuss any questions you have with your health care provider. Document Released: 08/11/2007 Document Revised: 02/09/2018 Document Reviewed: 09/01/2017 Elsevier Patient Education  2020 Elsevier Inc.  

## 2019-01-10 DIAGNOSIS — M5416 Radiculopathy, lumbar region: Secondary | ICD-10-CM | POA: Diagnosis not present

## 2019-01-10 DIAGNOSIS — M545 Low back pain: Secondary | ICD-10-CM | POA: Diagnosis not present

## 2019-01-10 DIAGNOSIS — M5412 Radiculopathy, cervical region: Secondary | ICD-10-CM | POA: Diagnosis not present

## 2019-01-10 DIAGNOSIS — M4316 Spondylolisthesis, lumbar region: Secondary | ICD-10-CM | POA: Diagnosis not present

## 2019-01-10 DIAGNOSIS — M4126 Other idiopathic scoliosis, lumbar region: Secondary | ICD-10-CM | POA: Diagnosis not present

## 2019-01-11 LAB — URINE CULTURE
MICRO NUMBER:: 1059461
SPECIMEN QUALITY:: ADEQUATE

## 2019-01-12 ENCOUNTER — Other Ambulatory Visit: Payer: Self-pay | Admitting: Internal Medicine

## 2019-01-12 NOTE — Telephone Encounter (Signed)
Patient is needing her medication but isnt sure which one it is . Please advise    Call back number [phone]

## 2019-01-12 NOTE — Telephone Encounter (Signed)
Returned cal to patient she has the [facility] that was prescribed and is taking and has had no side effects.

## 2019-01-19 ENCOUNTER — Telehealth: Payer: Self-pay | Admitting: *Deleted

## 2019-01-19 MED ORDER — FLUCONAZOLE 150 MG PO TABS: 150.0000 mg | ORAL_TABLET | Freq: Every day | ORAL | 0 refills | Status: DC

## 2019-01-19 NOTE — Telephone Encounter (Signed)
Tell her to sop lubricating her vagina and start taking the fluconazole for 3 days.  Suspend simvastatin for a week starting today

## 2019-01-19 NOTE — Telephone Encounter (Signed)
Patient believes she has yeast from recent antibiotic she feels raw, has green is discharge , light flow, itches. Has used vaseline for barrier and premarin .

## 2019-01-19 NOTE — Telephone Encounter (Signed)
Patient notified and voiced understanding.

## 2019-01-19 NOTE — Telephone Encounter (Signed)
Copied from [place] [id number]. Topic: General - Inquiry >> Jan 19, 2019  2:38 PM Percell Belt A wrote: Reason for CRM: pt called in and stated that she now has a yeast infection for the meds she was put on for the UT.  She would like to know something could be called in today to help. She stated it really bad and irritated .   [facility] Best number [phone]

## 2019-01-24 DIAGNOSIS — M4126 Other idiopathic scoliosis, lumbar region: Secondary | ICD-10-CM | POA: Diagnosis not present

## 2019-01-29 DIAGNOSIS — M4126 Other idiopathic scoliosis, lumbar region: Secondary | ICD-10-CM | POA: Diagnosis not present

## 2019-02-06 DIAGNOSIS — M4126 Other idiopathic scoliosis, lumbar region: Secondary | ICD-10-CM | POA: Diagnosis not present

## 2019-02-09 DIAGNOSIS — M4126 Other idiopathic scoliosis, lumbar region: Secondary | ICD-10-CM | POA: Diagnosis not present

## 2019-02-12 ENCOUNTER — Other Ambulatory Visit: Payer: Self-pay | Admitting: Internal Medicine

## 2019-02-20 DIAGNOSIS — M4126 Other idiopathic scoliosis, lumbar region: Secondary | ICD-10-CM | POA: Diagnosis not present

## 2019-02-28 DIAGNOSIS — M4126 Other idiopathic scoliosis, lumbar region: Secondary | ICD-10-CM | POA: Diagnosis not present

## 2019-03-06 DIAGNOSIS — M4126 Other idiopathic scoliosis, lumbar region: Secondary | ICD-10-CM | POA: Diagnosis not present

## 2019-04-02 DIAGNOSIS — M5416 Radiculopathy, lumbar region: Secondary | ICD-10-CM | POA: Diagnosis not present

## 2019-04-04 ENCOUNTER — Telehealth: Payer: Self-pay | Admitting: Internal Medicine

## 2019-04-04 NOTE — Telephone Encounter (Signed)
Pt called [facility] mail order and they told her she needs a new prescription sent for fluticasone.

## 2019-04-05 MED ORDER — FLUTICASONE PROPIONATE 50 MCG/ACT NA SUSP: 2.0000 | Freq: Every day | NASAL | 3 refills | Status: DC

## 2019-04-05 NOTE — Telephone Encounter (Signed)
Medication has been refilled.

## 2019-04-17 DIAGNOSIS — H43813 Vitreous degeneration, bilateral: Secondary | ICD-10-CM | POA: Diagnosis not present

## 2019-04-28 ENCOUNTER — Ambulatory Visit: Payer: Medicare Other | Attending: Internal Medicine

## 2019-04-28 DIAGNOSIS — Z23 Encounter for immunization: Secondary | ICD-10-CM | POA: Insufficient documentation

## 2019-04-28 NOTE — Progress Notes (Signed)
   Covid-19 Vaccination Clinic  Name:  Jamie Anderson    MRN: [id number] DOB: Jul 21, 1935  04/28/2019  [name] was observed post Covid-19 immunization for 15 minutes without incidence. She was provided with Vaccine Information Sheet and instruction to access the V-Safe system.   [name] was instructed to call 911 with any severe reactions post vaccine: Marland Kitchen Difficulty breathing  . Swelling of your face and throat  . A fast heartbeat  . A bad rash all over your body  . Dizziness and weakness    Immunizations Administered    Name Date Dose VIS Date Route   Pfizer COVID-19 Vaccine 04/28/2019 10:17 AM 0.3 mL 02/16/2019 Intramuscular   Manufacturer: [place]   Lot: [id number]   [place]: [id number]

## 2019-05-16 ENCOUNTER — Other Ambulatory Visit: Payer: Self-pay | Admitting: Cardiovascular Disease

## 2019-05-16 NOTE — Telephone Encounter (Signed)
Please schedule office visit for refills. Thank you! 

## 2019-05-16 NOTE — Telephone Encounter (Signed)
Patient scheduled on 3/19 with Gilford Rile

## 2019-05-23 ENCOUNTER — Ambulatory Visit: Payer: Medicare Other | Attending: Internal Medicine

## 2019-05-23 DIAGNOSIS — Z23 Encounter for immunization: Secondary | ICD-10-CM

## 2019-05-23 NOTE — Progress Notes (Signed)
   Covid-19 Vaccination Clinic  Name:  Jamie Anderson    MRN: [id number] DOB: 1935/04/17  05/23/2019  [name] was observed post Covid-19 immunization for 15 minutes without incident. She was provided with Vaccine Information Sheet and instruction to access the V-Safe system.   [name] was instructed to call 911 with any severe reactions post vaccine: Marland Kitchen Difficulty breathing  . Swelling of face and throat  . A fast heartbeat  . A bad rash all over body  . Dizziness and weakness   Immunizations Administered    Name Date Dose VIS Date Route   Pfizer COVID-19 Vaccine 05/23/2019 11:32 AM 0.3 mL 02/16/2019 Intramuscular   Manufacturer: [place]   Lot: [id number]   [place]: [id number]

## 2019-05-25 ENCOUNTER — Encounter: Payer: Self-pay | Admitting: Family

## 2019-05-25 ENCOUNTER — Other Ambulatory Visit: Payer: Self-pay

## 2019-05-25 ENCOUNTER — Ambulatory Visit (INDEPENDENT_AMBULATORY_CARE_PROVIDER_SITE_OTHER): Payer: Medicare Other | Admitting: Family

## 2019-05-25 VITALS — BP 140/68 | HR 68 | Ht 64.0 in | Wt 192.5 lb

## 2019-05-25 DIAGNOSIS — I10 Essential (primary) hypertension: Secondary | ICD-10-CM

## 2019-05-25 DIAGNOSIS — E785 Hyperlipidemia, unspecified: Secondary | ICD-10-CM

## 2019-05-25 DIAGNOSIS — I700 Atherosclerosis of aorta: Secondary | ICD-10-CM | POA: Diagnosis not present

## 2019-05-25 MED ORDER — EZETIMIBE 10 MG PO TABS: 10.0000 mg | ORAL_TABLET | Freq: Every day | ORAL | 3 refills | Status: DC

## 2019-05-25 MED ORDER — ASPIRIN EC 81 MG PO TBEC: 81.0000 mg | DELAYED_RELEASE_TABLET | Freq: Every day | ORAL | 3 refills | Status: DC

## 2019-05-25 NOTE — Progress Notes (Signed)
Office Visit    Patient Name: Jamie Anderson Date of Encounter: 05/25/2019  Primary Care Provider:  Crecencio Mc, MD Primary Cardiologist:  Ida Rogue, MD Electrophysiologist:  None   Chief Complaint    Jamie Anderson is a 84 y.o. female with a hx of HTN, HLD, chronic back pain presents today for follow-up of HTN and HLD.  Past Medical History    Past Medical History:  Diagnosis Date  . Granuloma annulare   . Hyperlipidemia   . Hypertension   . Menopause   . Plantar fasciitis of right foot 12/24/2014   Past Surgical History:  Procedure Laterality Date  . APPENDECTOMY    . DILATION AND CURETTAGE OF UTERUS    . RIGHT OOPHORECTOMY Right feb 2014   [name]  . [place]  2010  . VENOUS ABLATION     bilateral    Allergies  Allergies  Allergen Reactions  . Biaxin [Clarithromycin]   . Corn-Containing Products   . Dust Mite Extract     Almonds,Mold Spores  . Gluten Meal   . Oat   . Shrimp [Shellfish Allergy]     History of Present Illness    Jamie Anderson is a 84 y.o. female with a hx of HTN, HLD, aortic atherosclerosis on CT, chronic back pain last seen 04/24/2018 by Dr. Rockey Situ.  Family history notable for brother with CABG and sister with "open heart surgery ".  She was taken off her NSAIDS previously used for arthritis due to kidney function.   Checks blood pressure at home with systolic readings typically in the 150s. Does not take her HCTZ every day as she doesn't like having to go to the restroom.   Reports no chest pain, pressure, tightness. No shortness of breath nor dyspnea on exertion. No palpitations, PND, orthopnea, LE edema.   Enjoys staying busy in her yard when her knees allow her to. Endorses eating a heart healthy diet. Tries to eat primarily at home.   EKGs/Labs/Other Studies Reviewed:   The following studies were reviewed today:  EKG:  EKG is ordered today.  The ekg ordered today demonstrates SR 70 bpm with no acute ST/T wave  changes.   Recent Labs: 07/12/2018: ALT 18 08/21/2018: BUN 23; Creatinine, Ser 1.02; Potassium 4.3; Sodium 138  Recent Lipid Panel    Component Value Date/Time   CHOL 162 07/12/2018 1053   CHOL 188 05/26/2011 0958   TRIG 73.0 07/12/2018 1053   HDL 74.40 07/12/2018 1053   HDL 89 05/26/2011 0958   CHOLHDL 2 07/12/2018 1053   VLDL 14.6 07/12/2018 1053   LDLCALC 73 07/12/2018 1053   LDLCALC 83 05/26/2011 0958   LDLDIRECT 59.0 01/30/2018 1602    Home Medications   Current Meds  Medication Sig  . albuterol (PROVENTIL HFA;VENTOLIN HFA) 108 (90 Base) MCG/ACT inhaler Inhale 1 puff into the lungs every 6 (six) hours as needed for wheezing or shortness of breath.  . allopurinol (ZYLOPRIM) 100 MG tablet TAKE 1 TABLET BY MOUTH  DAILY  . ANORO ELLIPTA 62.5-25 MCG/INH AEPB Inhale 1 puff into the lungs daily. (Patient taking differently: Inhale 1 puff into the lungs daily as needed. )  . cefdinir (OMNICEF) 300 MG capsule Take 1 capsule (300 mg total) by mouth 2 (two) times daily.  . Coenzyme Q10 (CO Q 10 PO) Take by mouth daily.  Marland Kitchen ezetimibe (ZETIA) 10 MG tablet TAKE 1 TABLET BY MOUTH  DAILY  . fluconazole (DIFLUCAN) 150 MG tablet  Take 1 tablet (150 mg total) by mouth daily.  . fluticasone (FLONASE) 50 MCG/ACT nasal spray Place 2 sprays into both nostrils daily.  Marland Kitchen gabapentin (NEURONTIN) 300 MG capsule   . hydrochlorothiazide (MICROZIDE) 12.5 MG capsule TAKE 1 CAPSULE BY MOUTH  DAILY  . losartan (COZAAR) 100 MG tablet TAKE 1 TABLET BY MOUTH  DAILY  . meloxicam (MOBIC) 7.5 MG tablet Take 7.5 mg by mouth as needed for pain.  . metoprolol succinate (TOPROL-XL) 25 MG 24 hr tablet TAKE 1 TABLET BY MOUTH  DAILY  . ondansetron (ZOFRAN) 4 MG tablet Take 1 tablet (4 mg total) by mouth every 8 (eight) hours as needed for nausea or vomiting.  . pantoprazole (PROTONIX) 40 MG tablet TAKE 1 TABLET BY MOUTH  DAILY  . PREMARIN vaginal cream Place 1 Applicatorful vaginally 2 (two) times a week.  . sertraline  (ZOLOFT) 100 MG tablet TAKE 1 TABLET BY MOUTH  EVERY DAY  . simvastatin (ZOCOR) 40 MG tablet TAKE 1 TABLET BY MOUTH AT  BEDTIME  . traMADol (ULTRAM) 50 MG tablet Take 1 tablet (50 mg total) by mouth every 8 (eight) hours as needed.    Review of Systems      Review of Systems  Constitution: Negative for chills, fever and malaise/fatigue.  Cardiovascular: Negative for chest pain, dyspnea on exertion, leg swelling, near-syncope, orthopnea, palpitations and syncope.  Respiratory: Negative for cough, shortness of breath and wheezing.   Gastrointestinal: Negative for nausea and vomiting.  Neurological: Negative for dizziness, light-headedness and weakness.   All other systems reviewed and are otherwise negative except as noted above.  Physical Exam    VS:  BP 140/68 (BP Location: Left Arm, Patient Position: Sitting, Cuff Size: Normal)   Pulse 68   Ht 5\' 4"  (1.626 m)   Wt 192 lb 8 oz (87.3 kg)   SpO2 98%   BMI 33.04 kg/m  , BMI Body mass index is 33.04 kg/m. GEN: Well nourished, well developed, in no acute distress. HEENT: normal. Neck: Supple, no JVD, carotid bruits, or masses. Cardiac: RRR, no murmurs, rubs, or gallops. No clubbing, cyanosis, edema.  Radials/DP/PT 2+ and equal bilaterally.  Respiratory:  Respirations regular and unlabored, clear to auscultation bilaterally. GI: Soft, nontender, nondistended, BS + x 4. MS: No deformity or atrophy. Skin: Warm and dry, no rash. Neuro:  Strength and sensation are intact. Psych: Normal affect.  Accessory Clinical Findings    ECG personally reviewed by me today - SR 70 bpm  - no acute changes.  Assessment & Plan    1. HTN - BP today 140/68 but has not yet taken her morning medications. She will check BP three times per week at home and report BP consistently >130/80. Continue Hctz 12.5mg  daily, Losartan 100mg  daily, Toprol 25mg  daily. If BP found to be consistently elevated, consider increased dose of HCTZ versus transition from  Metoprolol to Carvedilol. May be better to switch beta blocker for compliance as she does not like the urinary frequency with HCTZ.  2. HLD -lipid panel 07/12/2018 with total cholesterol 162, HDL 74, LDL 73.  Continue Zetia 10 mg and simvastatin 40 mg daily. Refills provided today.   3. Aortic atherosclerosis -noted by CT scan.  Continue statin, beta blocker. She will resume aspirin 81mg  daily.   Disposition: Follow up in 1 year(s) with Dr. Rockey Situ or APP.   Loel Dubonnet, NP 05/25/2019, 11:38 AM

## 2019-05-25 NOTE — Patient Instructions (Addendum)
Medication Instructions:  Your physician has recommended you make the following change in your medication:   START Aspirin 81mg  daily. We sent this into [facility] for you.   A refill of your Zetia has been sent to [facility] for you.  *If you need a refill on your cardiac medications before your next appointment, please call your pharmacy*   Lab Work: No lab work today. Your cholesterol panel from May with Dr. Derrel Nip looked great!  Testing/Procedures: Your EKG today showed normal sinus rhythm which is a good result!  Follow-Up: At [facility], you and your health needs are our priority.  As part of our continuing mission to provide you with exceptional heart care, we have created designated Provider Care Teams.  These Care Teams include your primary Cardiologist (physician) and Advanced Practice Providers (APPs -  Physician Assistants and Nurse Practitioners) who all work together to provide you with the care you need, when you need it.  We recommend signing up for the patient portal called "MyChart".  Sign up information is provided on this After Visit Summary.  MyChart is used to connect with patients for Virtual Visits (Telemedicine).  Patients are able to view lab/test results, encounter notes, upcoming appointments, etc.  Non-urgent messages can be sent to your provider as well.   To learn more about what you can do with MyChart, go to [email or web address].    Your next appointment:   6 month(s)  The format for your next appointment:   In Person  Provider:    You may see Ida Rogue, MD or one of the following Advanced Practice Providers on your designated Care Team:    Murray Hodgkins, NP  Christell Faith, PA-C  Marrianne Mood, PA-C  Other Instructions   Ned Card - would recommend using daily to see if it improves your wheezing.    Take your blood pressure three times per week and call us in two weeks to let us know what your blood pressure is. Our goal is for  your blood pressure to be less than 130/80. Try to take your Hydrochlorothiazide every day.    Keep up the regular activity.   Continue low sodium diet.

## 2019-06-14 DIAGNOSIS — M5416 Radiculopathy, lumbar region: Secondary | ICD-10-CM | POA: Diagnosis not present

## 2019-07-03 ENCOUNTER — Other Ambulatory Visit: Payer: Self-pay

## 2019-07-03 ENCOUNTER — Ambulatory Visit: Payer: Medicare Other | Admitting: Dermatology

## 2019-07-03 DIAGNOSIS — L920 Granuloma annulare: Secondary | ICD-10-CM

## 2019-07-03 DIAGNOSIS — L853 Xerosis cutis: Secondary | ICD-10-CM

## 2019-07-03 DIAGNOSIS — L820 Inflamed seborrheic keratosis: Secondary | ICD-10-CM | POA: Diagnosis not present

## 2019-07-03 DIAGNOSIS — L821 Other seborrheic keratosis: Secondary | ICD-10-CM

## 2019-07-03 DIAGNOSIS — D1800 Hemangioma unspecified site: Secondary | ICD-10-CM

## 2019-07-03 DIAGNOSIS — Z85828 Personal history of other malignant neoplasm of skin: Secondary | ICD-10-CM

## 2019-07-03 DIAGNOSIS — L578 Other skin changes due to chronic exposure to nonionizing radiation: Secondary | ICD-10-CM

## 2019-07-03 NOTE — Progress Notes (Signed)
Follow-Up Visit   Subjective  Jamie Anderson is a 84 y.o. female who presents for the following: Follow-up (Cryotherapy for ISK from last office visit.) and xerosis of legs (B/L legs ).  Patient states that she has a spot on the back of her neck that had been irritated, but not at this time.  But she has other spots on her legs, neck, and scalp that itch and she picks at.  She also has a h/o Granuloma Annulare on her L thigh that will itch occassionally. She has a h/o BCC L chest, 02/06/2018.  The following portions of the chart were reviewed this encounter and updated as appropriate:      Review of Systems:  No other skin or systemic complaints except as noted in HPI or Assessment and Plan.  Objective  Well appearing patient in no apparent distress; mood and affect are within normal limits.  A focused examination was performed including lower extremities, including the legs, feet, toes, and toenails and back. Relevant physical exam findings are noted in the Assessment and Plan.  Objective  Left Hip: Mild erythema   Objective  Left Chest: Well healed scar with no evidence of recurrence.   Objective  Left Lower Pretibia x 2 (2), Left Temporal Scalp, Left Thigh - Anterior (2), Right Ankle - Anterior, Right Lower neck x 5 (5): Erythematous keratotic or waxy stuck-on papule.  ISKs treated last visit have cleared   Assessment & Plan  Granuloma annulare Left Hip  Controlled Continue Clobetasol 0.5% cream qd/bid prn flares- pt has Recommend Gold Bond Rapid Relief Anti-Itch cream up to 3 times per day to areas that are itchy.   History of basal cell carcinoma (BCC) Left Chest  Clear. Observe for recurrence. Call clinic for new or changing lesions.  Recommend regular skin exams, daily broad-spectrum spf 30+ sunscreen use, and photoprotection.   02/06/2018     Inflamed seborrheic keratosis (11) Right Ankle - Anterior; Left Thigh - Anterior (2); Left Lower Pretibia x 2 (2);  Right Lower neck x 5 (5); Left Temporal Scalp  Cryotherapy today Prior to procedure, discussed risks of blister formation, small wound, skin dyspigmentation, or rare scar following cryotherapy.    Destruction of lesion - Left Lower Pretibia x 2, Left Temporal Scalp, Left Thigh - Anterior, Right Ankle - Anterior, Right Lower neck x 5  Destruction method: cryotherapy   Informed consent: discussed and consent obtained   Lesion destroyed using liquid nitrogen: Yes   Region frozen until ice ball extended beyond lesion: Yes   Outcome: patient tolerated procedure well with no complications   Post-procedure details: wound care instructions given      Hemangiomas - Red papules - Discussed benign nature - Observe - Call for any changes  Seborrheic Keratoses - Stuck-on, waxy, tan-brown papules and plaques  - Discussed benign etiology and prognosis. - Observe - Call for any changes  Xerosis - diffuse xerotic patches - recommend gentle, hydrating skin care - gentle skin care handout given  Actinic Damage - diffuse scaly erythematous macules with underlying dyspigmentation - Recommend daily broad spectrum sunscreen SPF 30+ to sun-exposed areas, reapply every 2 hours as needed.  - Call for new or changing lesions.    Return in about 6 months (around 01/02/2020) for TBSE, ISK f/u.  Marene Lenz, CMA, am acting as scribe for Brendolyn Patty, MD .  Documentation: I have reviewed the above documentation for accuracy and completeness, and I agree with the above.  Baxter Flattery  Nicole Kindred, MD

## 2019-07-03 NOTE — Patient Instructions (Addendum)
Cryotherapy Aftercare  . Wash gently with soap and water everyday.   . Apply Vaseline and Band-Aid daily until healed.    Gentle Skin Care Guide  1. Bathe no more than once a day.  2. Avoid bathing in hot water  3. Use a mild soap like Dove, Vanicream, Cetaphil, CeraVe. Can use Lever 2000 or Cetaphil antibacterial soap  4. Use soap only where you need it. On most days, use it under your arms, between your legs, and on your feet. Let the water rinse other areas unless visibly dirty.  5. When you get out of the bath/shower, use a towel to gently blot your skin dry, don't rub it.  6. While your skin is still a little damp, apply a moisturizing cream such as Vanicream, CeraVe, Cetaphil, Eucerin, Sarna lotion or plain Vaseline Jelly. For hands apply Neutrogena Norwegian Hand Cream or Excipial Hand Cream.  7. Reapply moisturizer any time you start to itch or feel dry.  8. Sometimes using free and clear laundry detergents can be helpful. Fabric softener sheets should be avoided. Downy Free & Gentle liquid, or any liquid fabric softener that is free of dyes and perfumes, it acceptable to use  9. If your doctor has given you prescription creams you may apply moisturizers over them     

## 2019-07-12 DIAGNOSIS — Z981 Arthrodesis status: Secondary | ICD-10-CM | POA: Diagnosis not present

## 2019-07-12 DIAGNOSIS — M4126 Other idiopathic scoliosis, lumbar region: Secondary | ICD-10-CM | POA: Diagnosis not present

## 2019-07-12 DIAGNOSIS — M5416 Radiculopathy, lumbar region: Secondary | ICD-10-CM | POA: Diagnosis not present

## 2019-07-20 DIAGNOSIS — M1711 Unilateral primary osteoarthritis, right knee: Secondary | ICD-10-CM | POA: Diagnosis not present

## 2019-08-01 ENCOUNTER — Other Ambulatory Visit: Payer: Self-pay | Admitting: Internal Medicine

## 2019-08-01 DIAGNOSIS — M25561 Pain in right knee: Secondary | ICD-10-CM

## 2019-08-21 ENCOUNTER — Other Ambulatory Visit: Payer: Self-pay | Admitting: *Deleted

## 2019-08-21 DIAGNOSIS — E785 Hyperlipidemia, unspecified: Secondary | ICD-10-CM

## 2019-08-21 MED ORDER — EZETIMIBE 10 MG PO TABS: 10.0000 mg | ORAL_TABLET | Freq: Every day | ORAL | 0 refills | Status: DC

## 2019-08-22 ENCOUNTER — Other Ambulatory Visit: Payer: Self-pay

## 2019-08-22 ENCOUNTER — Ambulatory Visit (INDEPENDENT_AMBULATORY_CARE_PROVIDER_SITE_OTHER): Payer: Medicare HMO

## 2019-08-22 VITALS — BP 151/84 | HR 70 | Ht 64.0 in | Wt 192.0 lb

## 2019-08-22 DIAGNOSIS — M542 Cervicalgia: Secondary | ICD-10-CM

## 2019-08-22 DIAGNOSIS — Z0000 Encounter for general adult medical examination without abnormal findings: Secondary | ICD-10-CM | POA: Diagnosis not present

## 2019-08-22 DIAGNOSIS — M5030 Other cervical disc degeneration, unspecified cervical region: Secondary | ICD-10-CM

## 2019-08-22 DIAGNOSIS — M25561 Pain in right knee: Secondary | ICD-10-CM

## 2019-08-22 MED ORDER — HYDROCHLOROTHIAZIDE 12.5 MG PO CAPS: 12.5000 mg | ORAL_CAPSULE | Freq: Every day | ORAL | 0 refills | Status: DC

## 2019-08-22 MED ORDER — METOPROLOL SUCCINATE ER 25 MG PO TB24: 25.0000 mg | ORAL_TABLET | Freq: Every day | ORAL | 0 refills | Status: DC

## 2019-08-22 MED ORDER — PANTOPRAZOLE SODIUM 40 MG PO TBEC: 40.0000 mg | DELAYED_RELEASE_TABLET | Freq: Every day | ORAL | 0 refills | Status: DC

## 2019-08-22 MED ORDER — SERTRALINE HCL 100 MG PO TABS: 100.0000 mg | ORAL_TABLET | Freq: Every day | ORAL | 0 refills | Status: DC

## 2019-08-22 MED ORDER — ALLOPURINOL 100 MG PO TABS: 100.0000 mg | ORAL_TABLET | Freq: Every day | ORAL | 0 refills | Status: DC

## 2019-08-22 MED ORDER — LOSARTAN POTASSIUM 100 MG PO TABS: 100.0000 mg | ORAL_TABLET | Freq: Every day | ORAL | 0 refills | Status: DC

## 2019-08-22 NOTE — Telephone Encounter (Signed)
Refilled: 10/30/2018 Last OV: 12/13/2018 Next OV: 09/05/2019

## 2019-08-22 NOTE — Patient Instructions (Addendum)
°  Ms. Sailer , Thank you for taking time to come for your Medicare Wellness Visit. I appreciate your ongoing commitment to your health goals. Please review the following plan we discussed and let me know if I can assist you in the future.   These are the goals we discussed: Goals      Patient Stated     Increase physical activity (pt-stated)      Walk more for exercise Monitor diet better       This is a list of the screening recommended for you and due dates:  Health Maintenance  Topic Date Due   Flu Shot  10/07/2019   Tetanus Vaccine  12/07/2023   DEXA scan (bone density measurement)  Completed   COVID-19 Vaccine  Completed   Pneumonia vaccines  Completed

## 2019-08-22 NOTE — Progress Notes (Addendum)
Subjective:   Jamie Anderson is a 84 y.o. female who presents for Medicare Annual (Subsequent) preventive examination.  Review of Systems:  No ROS.  Medicare Wellness Virtual Visit.    Cardiac Risk Factors include: advanced age (>31men, >54 women);hypertension     Objective:     Vitals: BP (!) 151/84 (BP Location: Left Arm, Patient Position: Sitting, Cuff Size: Normal)   Pulse 70   Ht 5\' 4"  (1.626 m)   Wt 192 lb (87.1 kg)   BMI 32.96 kg/m   Body mass index is 32.96 kg/m.  Advanced Directives 08/22/2019 08/17/2018 03/15/2018 05/31/2017 01/18/2017 04/20/2016  Does Patient Have a Medical Advance Directive? Yes Yes Yes Yes Yes Yes  Type of Paramedic of [place];Living will Living will;Healthcare Power of Attorney Living will;Healthcare Power of [place];Living will [place];Living will [place];Living will  Does patient want to make changes to medical advance directive? No - Patient declined No - Patient declined No - Patient declined No - Patient declined - No - Patient declined  Copy of [place] in Chart? No - copy requested No - copy requested No - copy requested No - copy requested No - copy requested No - copy requested  Would patient like information on creating a medical advance directive? - - - - No - Patient declined -    Tobacco Social History   Tobacco Use  Smoking Status Never Smoker  Smokeless Tobacco Never Used     Counseling given: Not Answered   Clinical Intake:  Pre-visit preparation completed: Yes        Diabetes: No  How often do you need to have someone help you when you read instructions, pamphlets, or other written materials from your doctor or pharmacy?: 1 - Never  Interpreter Needed?: No     Past Medical History:  Diagnosis Date  . Granuloma annulare   . Hyperlipidemia   . Hypertension   . Menopause   . Plantar fasciitis of  right foot 12/24/2014   Past Surgical History:  Procedure Laterality Date  . APPENDECTOMY    . DILATION AND CURETTAGE OF UTERUS    . RIGHT OOPHORECTOMY Right feb 2014   [name]  . [place]  2010  . VENOUS ABLATION     bilateral   Family History  Problem Relation Age of Onset  . Early death Mother        durig childbirth  . Heart disease Father   . Heart attack Father   . Heart disease Sister   . Cancer Brother   . Heart disease Brother        Afib   Social History   Socioeconomic History  . Marital status: Widowed    Spouse name: Not on file  . Number of children: Not on file  . Years of education: Not on file  . Highest education level: Not on file  Occupational History  . Not on file  Tobacco Use  . Smoking status: Never Smoker  . Smokeless tobacco: Never Used  Vaping Use  . Vaping Use: Never used  Substance and Sexual Activity  . Alcohol use: No    Alcohol/week: 4.0 standard drinks    Types: 4 Glasses of wine per week  . Drug use: No  . Sexual activity: Not Currently  Other Topics Concern  . Not on file  Social History Narrative  . Not on file   Social Determinants  of Health   Financial Resource Strain:   . Difficulty of Paying Living Expenses:   Food Insecurity:   . Worried About Charity fundraiser in the Last Year:   . Arboriculturist in the Last Year:   Transportation Needs:   . Film/video editor (Medical):   Marland Kitchen Lack of Transportation (Non-Medical):   Physical Activity:   . Days of Exercise per Week:   . Minutes of Exercise per Session:   Stress:   . Feeling of Stress :   Social Connections:   . Frequency of Communication with Friends and Family:   . Frequency of Social Gatherings with Friends and Family:   . Attends Religious Services:   . Active Member of Clubs or Organizations:   . Attends Archivist Meetings:   Marland Kitchen Marital Status:     Outpatient Encounter Medications as of 08/22/2019  Medication Sig  . albuterol  (PROVENTIL HFA;VENTOLIN HFA) 108 (90 Base) MCG/ACT inhaler Inhale 1 puff into the lungs every 6 (six) hours as needed for wheezing or shortness of breath.  . allopurinol (ZYLOPRIM) 100 MG tablet TAKE 1 TABLET BY MOUTH  DAILY  . ANORO ELLIPTA 62.5-25 MCG/INH AEPB Inhale 1 puff into the lungs daily. (Patient taking differently: Inhale 1 puff into the lungs daily as needed. )  . aspirin EC 81 MG tablet Take 1 tablet (81 mg total) by mouth daily.  . cefdinir (OMNICEF) 300 MG capsule Take 1 capsule (300 mg total) by mouth 2 (two) times daily.  . Coenzyme Q10 (CO Q 10 PO) Take by mouth daily.  Marland Kitchen ezetimibe (ZETIA) 10 MG tablet Take 1 tablet (10 mg total) by mouth daily.  . fluconazole (DIFLUCAN) 150 MG tablet Take 1 tablet (150 mg total) by mouth daily.  . fluticasone (FLONASE) 50 MCG/ACT nasal spray Place 2 sprays into both nostrils daily.  Marland Kitchen gabapentin (NEURONTIN) 300 MG capsule   . hydrochlorothiazide (MICROZIDE) 12.5 MG capsule TAKE 1 CAPSULE BY MOUTH  DAILY  . losartan (COZAAR) 100 MG tablet TAKE 1 TABLET BY MOUTH  DAILY  . meloxicam (MOBIC) 7.5 MG tablet Take 7.5 mg by mouth as needed for pain.  . metoprolol succinate (TOPROL-XL) 25 MG 24 hr tablet TAKE 1 TABLET BY MOUTH  DAILY  . ondansetron (ZOFRAN) 4 MG tablet Take 1 tablet (4 mg total) by mouth every 8 (eight) hours as needed for nausea or vomiting.  . pantoprazole (PROTONIX) 40 MG tablet TAKE 1 TABLET BY MOUTH  DAILY  . PREMARIN vaginal cream Place 1 Applicatorful vaginally 2 (two) times a week.  . sertraline (ZOLOFT) 100 MG tablet TAKE 1 TABLET BY MOUTH  DAILY  . simvastatin (ZOCOR) 40 MG tablet TAKE 1 TABLET BY MOUTH AT  BEDTIME  . traMADol (ULTRAM) 50 MG tablet Take 1 tablet (50 mg total) by mouth every 8 (eight) hours as needed.   No facility-administered encounter medications on file as of 08/22/2019.    Activities of Daily Living In your present state of health, do you have any difficulty performing the following activities:  08/22/2019  Hearing? N  Vision? N  Difficulty concentrating or making decisions? N  Walking or climbing stairs? Y  Comment Chronic knee pain. R worse than L. R leg injections admninistered.  Dressing or bathing? N  Doing errands, shopping? N  Preparing Food and eating ? N  Using the Toilet? N  In the past six months, have you accidently leaked urine? N  Do you have problems  with loss of bowel control? N  Managing your Medications? N  Managing your Finances? N  Housekeeping or managing your Housekeeping? N  Some recent data might be hidden    Patient Care Team: Crecencio Mc, MD as PCP - General (Internal Medicine) Minna Merritts, MD as PCP - Cardiology (Cardiology)    Assessment:   This is a routine wellness examination for [place].  I connected with Izora Gala today by telephone and verified that I am speaking with the correct person using two identifiers. Location patient: home Location provider: work Persons participating in the virtual visit: patient, Marine scientist.    I discussed the limitations, risks, security and privacy concerns of performing an evaluation and management service by telephone and the availability of in person appointments. The patient expressed understanding and verbally consented to this telephonic visit.    Interactive audio and video telecommunications were attempted between this provider and patient, however failed, due to patient having technical difficulties OR patient did not have access to video capability.  We continued and completed visit with audio only.  Some vital signs may be absent or patient reported.   Health Maintenance Due: -See completed HM at the end of note.   Eye: Visual acuity not assessed. Virtual visit. Followed by their ophthalmologist.  Dental: Dentures- yes  Hearing: Demonstrates normal hearing during visit.  Safety:  Patient feels safe at home- yes Patient does have smoke detectors at home- yes Patient does wear sunscreen  or protective clothing when in direct sunlight - yes Patient does wear seat belt when in a moving vehicle - yes Patient drives- yes Adequate lighting in walkways free from debris- yes Grab bars and handrails used as appropriate- yes Ambulates with an assistive device- yes; cane as needed Cell phone on person when ambulating outside of the home- yes  Social: Alcohol intake - no    Smoking history- never  Smokers in home? none Illicit drug use? none  Medication: Taking as directed and without issues.  Pill box in use -yes  Self managed - yes   Covid-19: Precautions and sickness symptoms discussed. Wears mask, social distancing, hand hygiene as appropriate.   Activities of Daily Living Patient denies needing assistance with: household chores, feeding themselves, getting from bed to chair, getting to the toilet, bathing/showering, dressing, managing money, or preparing meals.   Discussed the importance of a healthy diet, water intake and the benefits of aerobic exercise.   Physical activity- active around the home, works in the yard. Limited due to chronic back pain.   Diet:  Modified carb Water: good intake  Caffeine: 1 cup of coffee  Other Providers Patient Care Team: Crecencio Mc, MD as PCP - General (Internal Medicine) Minna Merritts, MD as PCP - Cardiology (Cardiology)   Exercise Activities and Dietary recommendations    Goals      Patient Stated   .  Increase physical activity (pt-stated)      Walk more for exercise Monitor diet better       Fall Risk Fall Risk  08/22/2019 10/30/2018 08/17/2018 05/31/2017 04/20/2016  Falls in the past year? 0 0 0 No No  Number falls in past yr: 0 0 - - -  Injury with Fall? - 0 - - -  Follow up Falls evaluation completed Falls evaluation completed - - -   Is the patient's home free of loose throw rugs in walkways, pet beds, electrical cords, etc?  Yes      Grab bars in  the bathroom? Yes      Handrails on the stairs?   Yes      Adequate lighting?   Yes   Timed Get Up and Go performed: No, virtual visit  Depression Screen PHQ 2/9 Scores 08/22/2019 10/30/2018 08/17/2018 05/31/2017  PHQ - 2 Score 0 0 0 0  PHQ- 9 Score - 0 - -     Cognitive Function Patient is alert and oriented x3. Patient denies difficulty focusing or concentrating. Patient likes to read and completes puzzles for brain health.   MMSE - Mini Mental State Exam 04/20/2016  Orientation to time 5  Orientation to Place 5  Registration 3  Attention/ Calculation 5  Recall 3  Language- name 2 objects 2  Language- repeat 1  Language- follow 3 step command 3  Language- read & follow direction 1  Write a sentence 1  Copy design 1  Total score 30     6CIT Screen 08/22/2019 08/17/2018 05/31/2017  What Year? 0 points 0 points 0 points  What month? 0 points 0 points 0 points  What time? 0 points 0 points 0 points  Count back from 20 - 0 points 0 points  Months in reverse 0 points 0 points 2 points  Repeat phrase 0 points - 0 points  Total Score - - 2    Immunization History  Administered Date(s) Administered  . Influenza Split 11/25/2011  . Influenza, High Dose Seasonal PF 01/30/2018  . Influenza,inj,Quad PF,6+ Mos 11/22/2012  . Influenza-Unspecified 12/20/2013, 01/07/2015, 01/09/2016, 12/15/2016  . PFIZER SARS-COV-2 Vaccination 04/28/2019, 05/23/2019  . Pneumococcal Conjugate-13 01/03/2014  . Pneumococcal Polysaccharide-23 11/25/2011, 02/09/2018  . Tdap 12/06/2013  . Zoster 12/29/2009   Screening Tests Health Maintenance  Topic Date Due  . INFLUENZA VACCINE  10/07/2019  . TETANUS/TDAP  12/07/2023  . DEXA SCAN  Completed  . COVID-19 Vaccine  Completed  . PNA vac Low Risk Adult  Completed    Cancer Screenings: Lung: Low Dose CT Chest recommended if Age 47-80 years, 30 pack-year currently smoking OR have quit w/in 15years. Patient does not qualify.    Plan:   Keep all routine maintenance appointments.   Cpe 09/05/19 @  10:30  Medicare Attestation I have personally reviewed: The patient's medical and social history Their use of alcohol, tobacco or illicit drugs Their current medications and supplements The patient's functional ability including ADLs,fall risks, home safety risks, cognitive, and hearing and visual impairment Diet and physical activities Evidence for depression    have reviewed and discussed with patient certain preventive protocols, quality metrics, and best practice recommendations.      OBrien-Blaney, Hadyn Blanck L, LPN  7/89/7847    I have reviewed the above information and agree with above.   Deborra Medina, MD

## 2019-08-22 NOTE — Telephone Encounter (Signed)
Refill denied.   6 month follow upis  required for refills on all controlled substances and her last refill came from a different doctor

## 2019-08-23 NOTE — Telephone Encounter (Signed)
Spoke with pt and she stated that she did not request a refill because she does not need any at this time.

## 2019-09-05 ENCOUNTER — Ambulatory Visit (INDEPENDENT_AMBULATORY_CARE_PROVIDER_SITE_OTHER): Payer: Medicare HMO

## 2019-09-05 ENCOUNTER — Other Ambulatory Visit: Payer: Self-pay

## 2019-09-05 ENCOUNTER — Encounter: Payer: Self-pay | Admitting: Internal Medicine

## 2019-09-05 ENCOUNTER — Ambulatory Visit (INDEPENDENT_AMBULATORY_CARE_PROVIDER_SITE_OTHER): Payer: Medicare HMO | Admitting: Internal Medicine

## 2019-09-05 ENCOUNTER — Telehealth: Payer: Self-pay

## 2019-09-05 VITALS — BP 124/78 | HR 68 | Temp 97.6°F | Resp 15 | Ht 64.0 in | Wt 192.8 lb

## 2019-09-05 DIAGNOSIS — I10 Essential (primary) hypertension: Secondary | ICD-10-CM | POA: Diagnosis not present

## 2019-09-05 DIAGNOSIS — N1831 Chronic kidney disease, stage 3a: Secondary | ICD-10-CM | POA: Diagnosis not present

## 2019-09-05 DIAGNOSIS — R300 Dysuria: Secondary | ICD-10-CM

## 2019-09-05 DIAGNOSIS — R1031 Right lower quadrant pain: Secondary | ICD-10-CM

## 2019-09-05 DIAGNOSIS — Z0000 Encounter for general adult medical examination without abnormal findings: Secondary | ICD-10-CM | POA: Diagnosis not present

## 2019-09-05 DIAGNOSIS — R202 Paresthesia of skin: Secondary | ICD-10-CM

## 2019-09-05 DIAGNOSIS — R8271 Bacteriuria: Secondary | ICD-10-CM

## 2019-09-05 DIAGNOSIS — E785 Hyperlipidemia, unspecified: Secondary | ICD-10-CM

## 2019-09-05 DIAGNOSIS — R5383 Other fatigue: Secondary | ICD-10-CM | POA: Diagnosis not present

## 2019-09-05 DIAGNOSIS — Z1231 Encounter for screening mammogram for malignant neoplasm of breast: Secondary | ICD-10-CM

## 2019-09-05 DIAGNOSIS — M1611 Unilateral primary osteoarthritis, right hip: Secondary | ICD-10-CM | POA: Diagnosis not present

## 2019-09-05 DIAGNOSIS — M48061 Spinal stenosis, lumbar region without neurogenic claudication: Secondary | ICD-10-CM

## 2019-09-05 DIAGNOSIS — R200 Anesthesia of skin: Secondary | ICD-10-CM | POA: Diagnosis not present

## 2019-09-05 LAB — CBC WITH DIFFERENTIAL/PLATELET
Basophils Absolute: 0.1 10*3/uL (ref 0.0–0.1)
Basophils Relative: 0.9 % (ref 0.0–3.0)
Eosinophils Absolute: 0.2 10*3/uL (ref 0.0–0.7)
Eosinophils Relative: 4 % (ref 0.0–5.0)
HCT: 36.4 % (ref 36.0–46.0)
Hemoglobin: 12.4 g/dL (ref 12.0–15.0)
Lymphocytes Relative: 24.6 % (ref 12.0–46.0)
Lymphs Abs: 1.3 10*3/uL (ref 0.7–4.0)
MCHC: 34 g/dL (ref 30.0–36.0)
MCV: 86.4 fl (ref 78.0–100.0)
Monocytes Absolute: 0.5 10*3/uL (ref 0.1–1.0)
Monocytes Relative: 8.6 % (ref 3.0–12.0)
Neutro Abs: 3.4 10*3/uL (ref 1.4–7.7)
Neutrophils Relative %: 61.9 % (ref 43.0–77.0)
Platelets: 181 10*3/uL (ref 150.0–400.0)
RBC: 4.21 Mil/uL (ref 3.87–5.11)
RDW: 15.3 % (ref 11.5–15.5)
WBC: 5.5 10*3/uL (ref 4.0–10.5)

## 2019-09-05 LAB — URINALYSIS, ROUTINE W REFLEX MICROSCOPIC
Bilirubin Urine: NEGATIVE
Hgb urine dipstick: NEGATIVE
Ketones, ur: NEGATIVE
Nitrite: NEGATIVE
Specific Gravity, Urine: 1.02 (ref 1.000–1.030)
Total Protein, Urine: NEGATIVE
Urine Glucose: NEGATIVE
Urobilinogen, UA: 0.2 (ref 0.0–1.0)
pH: 6.5 (ref 5.0–8.0)

## 2019-09-05 LAB — COMPREHENSIVE METABOLIC PANEL
ALT: 19 U/L (ref 0–35)
AST: 19 U/L (ref 0–37)
Albumin: 4.3 g/dL (ref 3.5–5.2)
Alkaline Phosphatase: 106 U/L (ref 39–117)
BUN: 23 mg/dL (ref 6–23)
CO2: 30 mEq/L (ref 19–32)
Calcium: 9.8 mg/dL (ref 8.4–10.5)
Chloride: 104 mEq/L (ref 96–112)
Creatinine, Ser: 0.95 mg/dL (ref 0.40–1.20)
GFR: 56.34 mL/min — ABNORMAL LOW (ref >60.00)
Glucose, Bld: 102 mg/dL — ABNORMAL HIGH (ref 70–99)
Potassium: 4.3 mEq/L (ref 3.5–5.1)
Sodium: 141 mEq/L (ref 135–145)
Total Bilirubin: 0.4 mg/dL (ref 0.2–1.2)
Total Protein: 6.3 g/dL (ref 6.0–8.3)

## 2019-09-05 LAB — TSH: TSH: 1.62 u[IU]/mL (ref 0.35–4.50)

## 2019-09-05 LAB — LIPID PANEL
Cholesterol: 163 mg/dL (ref 0–200)
HDL: 79.4 mg/dL (ref >39.00)
LDL Cholesterol: 68 mg/dL (ref 0–99)
NonHDL: 83.19
Total CHOL/HDL Ratio: 2
Triglycerides: 76 mg/dL (ref 0.0–149.0)
VLDL: 15.2 mg/dL (ref 0.0–40.0)

## 2019-09-05 NOTE — Assessment & Plan Note (Signed)
She was referred to Dr Vertell Limber, neurosurgery and told she was not a candidate for more surgery.  She was referred for [facility] and PT.  She has had minimal relief of pain and develops anterior thigh numbness on the right with standing > 15 minutes.  She has become quite inactive due to her pain and is scheduled for another lower level ESI next month. Will maximize tylenol,  Schedule tramadol bid and request neurosurgery records to consider 2nd opinion

## 2019-09-05 NOTE — Assessment & Plan Note (Signed)

## 2019-09-05 NOTE — Assessment & Plan Note (Signed)
Brought on by walking.  Suspect DJD hip,  Films ordered

## 2019-09-05 NOTE — Telephone Encounter (Signed)
Called patient to ask if she has any urinary symptoms. Left a message to call back.

## 2019-09-05 NOTE — Telephone Encounter (Signed)
Pt called back and said that she is not having any urinary symptoms

## 2019-09-05 NOTE — Assessment & Plan Note (Addendum)
Managed with zetia and simvastatin  ,  Liver enzymes are overdue.  No changes today Lab Results  Component Value Date   CHOL 162 07/12/2018   HDL 74.40 07/12/2018   LDLCALC 73 07/12/2018   LDLDIRECT 59.0 01/30/2018   TRIG 73.0 07/12/2018   CHOLHDL 2 07/12/2018   Lab Results  Component Value Date   ALT 18 07/12/2018   AST 19 07/12/2018   ALKPHOS 111 07/12/2018   BILITOT 0.4 07/12/2018

## 2019-09-05 NOTE — Patient Instructions (Addendum)
You can add up to 2000 mg of acetominophen (tylenol) every day safely  In divided doses (500 mg every 6 hours  Or 1000 mg every 12 hours.)  I recommend adding a 4th tablet in the afternoon     Ou can take tramadol twice daily if needed for additional pain control  Please consider getting a medic alert necklace    Health Maintenance After Age 82 After age 24, you are at a higher risk for certain long-term diseases and infections as well as injuries from falls. Falls are a major cause of broken bones and head injuries in people who are older than age 41. Getting regular preventive care can help to keep you healthy and well. Preventive care includes getting regular testing and making lifestyle changes as recommended by your health care provider. Talk with your health care provider about:  Which screenings and tests you should have. A screening is a test that checks for a disease when you have no symptoms.  A diet and exercise plan that is right for you. What should I know about screenings and tests to prevent falls? Screening and testing are the best ways to find a health problem early. Early diagnosis and treatment give you the best chance of managing medical conditions that are common after age 76. Certain conditions and lifestyle choices may make you more likely to have a fall. Your health care provider may recommend:  Regular vision checks. Poor vision and conditions such as cataracts can make you more likely to have a fall. If you wear glasses, make sure to get your prescription updated if your vision changes.  Medicine review. Work with your health care provider to regularly review all of the medicines you are taking, including over-the-counter medicines. Ask your health care provider about any side effects that may make you more likely to have a fall. Tell your health care provider if any medicines that you take make you feel dizzy or sleepy.  Osteoporosis screening. Osteoporosis is a  condition that causes the bones to get weaker. This can make the bones weak and cause them to break more easily.  Blood pressure screening. Blood pressure changes and medicines to control blood pressure can make you feel dizzy.  Strength and balance checks. Your health care provider may recommend certain tests to check your strength and balance while standing, walking, or changing positions.  Foot health exam. Foot pain and numbness, as well as not wearing proper footwear, can make you more likely to have a fall.  Depression screening. You may be more likely to have a fall if you have a fear of falling, feel emotionally low, or feel unable to do activities that you used to do.  Alcohol use screening. Using too much alcohol can affect your balance and may make you more likely to have a fall. What actions can I take to lower my risk of falls? General instructions  Talk with your health care provider about your risks for falling. Tell your health care provider if: ? You fall. Be sure to tell your health care provider about all falls, even ones that seem minor. ? You feel dizzy, sleepy, or off-balance.  Take over-the-counter and prescription medicines only as told by your health care provider. These include any supplements.  Eat a healthy diet and maintain a healthy weight. A healthy diet includes low-fat dairy products, low-fat (lean) meats, and fiber from whole grains, beans, and lots of fruits and vegetables. Home safety  Remove any tripping  hazards, such as rugs, cords, and clutter.  Install safety equipment such as grab bars in bathrooms and safety rails on stairs.  Keep rooms and walkways well-lit. Activity   Follow a regular exercise program to stay fit. This will help you maintain your balance. Ask your health care provider what types of exercise are appropriate for you.  If you need a cane or walker, use it as recommended by your health care provider.  Wear supportive shoes  that have nonskid soles. Lifestyle  Do not drink alcohol if your health care provider tells you not to drink.  If you drink alcohol, limit how much you have: ? 0-1 drink a day for women. ? 0-2 drinks a day for men.  Be aware of how much alcohol is in your drink. In the [place] one drink equals one typical bottle of beer (12 oz), one-half glass of wine (5 oz), or one shot of hard liquor (1 oz).  Do not use any products that contain nicotine or tobacco, such as cigarettes and e-cigarettes. If you need help quitting, ask your health care provider. Summary  Having a healthy lifestyle and getting preventive care can help to protect your health and wellness after age 15.  Screening and testing are the best way to find a health problem early and help you avoid having a fall. Early diagnosis and treatment give you the best chance for managing medical conditions that are more common for people who are older than age 33.  Falls are a major cause of broken bones and head injuries in people who are older than age 16. Take precautions to prevent a fall at home.  Work with your health care provider to learn what changes you can make to improve your health and wellness and to prevent falls. This information is not intended to replace advice given to you by your health care provider. Make sure you discuss any questions you have with your health care provider. Document Revised: 06/15/2018 Document Reviewed: 01/05/2017 Elsevier Patient Education  2020 [employer].

## 2019-09-05 NOTE — Progress Notes (Signed)
Patient ID: Jamie Anderson, female    DOB: 1935-07-14  Age: 84 y.o. MRN: [id number]  The patient is here for annual follow up  and management of other chronic and acute problems.    This visit occurred during the SARS-CoV-2 public health emergency.  Safety protocols were in place, including screening questions prior to the visit, additional usage of staff PPE, and extensive cleaning of exam room while observing appropriate contact time as indicated for disinfecting solutions.    The risk factors are reflected in the social history.  The roster of all physicians providing medical care to patient - is listed in the Snapshot section of the chart.  Activities of daily living:  The patient is 100% independent in all ADLs: dressing, toileting, feeding as well as independent mobility  Home safety : The patient has smoke detectors in the home. They wear seatbelts.  There are no firearms at home. There is no violence in the home.   There is no risks for hepatitis, STDs or HIV. There is no   history of blood transfusion. They have no travel history to infectious disease endemic areas of the world.  The patient has seen their dentist in the last six month. They have seen their eye doctor in the last year. They admit to slight hearing difficulty with regard to whispered voices and some television programs.  They have deferred audiologic testing in the last year.  They do not  have excessive sun exposure. Discussed the need for sun protection: hats, long sleeves and use of sunscreen if there is significant sun exposure.   Diet: the importance of a healthy diet is discussed. They do have a healthy diet.  The benefits of regular aerobic exercise were discussed. She has not beenactive due to persistent low back pain.   Depression screen: there are no signs or vegative symptoms of depression- irritability, change in appetite, anhedonia, sadness/tearfullness.  Cognitive assessment: the patient manages all  their financial and personal affairs and is actively engaged. They could relate day,date,year and events; recalled 2/3 objects at 3 minutes; performed clock-face test normally.  The following portions of the patient's history were reviewed and updated as appropriate: allergies, current medications, past family history, past medical history,  past surgical history, past social history  and problem list.  Visual acuity was not assessed per patient preference since she has regular follow up with her ophthalmologist. Hearing and body mass index were assessed and reviewed.   During the course of the visit the patient was educated and counseled about appropriate screening and preventive services including : fall prevention , diabetes screening, nutrition counseling, colorectal cancer screening, and recommended immunizations.    CC: The primary encounter diagnosis was Right groin pain. Diagnoses of Essential hypertension, Hyperlipidemia LDL goal <130, Stage 3a chronic kidney disease, Fatigue, unspecified type, Dysuria, Degenerative lumbar spinal stenosis, Numbness and tingling of right arm, Encounter for preventive health examination, Breast cancer screening by mammogram, and Intermittent right lower quadrant abdominal pain were also pertinent to this visit.   Last seen April 2020.   Referred to Neurosurgery  in May 2020  for spinal stenosis by MRI done in April 2020  to investigate persistent Low back pain L3 radiculopathy .  No surgery recommended. (NO NOTE IN CHART) SAW [facility] AT [place]) Was  referred to Orthopedics for  2 ESIs which did not help .  Seeing [facility] in [place],  Has another ESI scheduled for July 15 lower in the back . Taking  tylenol 500 mg tid   Has right inguinal pain  Aggravated by walking   )   Her back pain has resulted in a sedentary lifestyle .  Can't do any gardenig,  No shopping except groceries (uses the buggie to hold on to).  cannot knee bc she can't get up  unassisted.Finds that   prolonged standing causes right thigh to become  numb.  Has not had any falls, but . A few stumbles .  Takes the phone to the shower . Does not have a Financial controller.     History Jamie Anderson has a past medical history of Granuloma annulare, Hyperlipidemia, Hypertension, Menopause, and Plantar fasciitis of right foot (12/24/2014).   She has a past surgical history that includes Spine surgery (2010); Appendectomy; Venous ablation; Dilation and curettage of uterus; and Right oophorectomy (Right, feb 2014).   Her family history includes Cancer in her brother; Early death in her mother; Heart attack in her father; Heart disease in her brother, father, and sister.She reports that she has never smoked. She has never used smokeless tobacco. She reports that she does not drink alcohol and does not use drugs.  Outpatient Medications Prior to Visit  Medication Sig Dispense Refill  . albuterol (PROVENTIL HFA;VENTOLIN HFA) 108 (90 Base) MCG/ACT inhaler Inhale 1 puff into the lungs every 6 (six) hours as needed for wheezing or shortness of breath.    . allopurinol (ZYLOPRIM) 100 MG tablet Take 1 tablet (100 mg total) by mouth daily. 90 tablet 0  . ANORO ELLIPTA 62.5-25 MCG/INH AEPB Inhale 1 puff into the lungs daily. (Patient taking differently: Inhale 1 puff into the lungs daily as needed. ) 1 each 6  . aspirin EC 81 MG tablet Take 1 tablet (81 mg total) by mouth daily. 90 tablet 3  . ezetimibe (ZETIA) 10 MG tablet Take 1 tablet (10 mg total) by mouth daily. 90 tablet 0  . fluticasone (FLONASE) 50 MCG/ACT nasal spray Place 2 sprays into both nostrils daily. 48 g 3  . gabapentin (NEURONTIN) 300 MG capsule     . hydrochlorothiazide (MICROZIDE) 12.5 MG capsule Take 1 capsule (12.5 mg total) by mouth daily. 90 capsule 0  . losartan (COZAAR) 100 MG tablet Take 1 tablet (100 mg total) by mouth daily. 90 tablet 0  . meloxicam (MOBIC) 7.5 MG tablet Take 7.5 mg by mouth as needed for pain.    .  metoprolol succinate (TOPROL-XL) 25 MG 24 hr tablet Take 1 tablet (25 mg total) by mouth daily. 90 tablet 0  . ondansetron (ZOFRAN) 4 MG tablet Take 1 tablet (4 mg total) by mouth every 8 (eight) hours as needed for nausea or vomiting. 20 tablet 0  . pantoprazole (PROTONIX) 40 MG tablet Take 1 tablet (40 mg total) by mouth daily. 90 tablet 0  . PREMARIN vaginal cream Place 1 Applicatorful vaginally 2 (two) times a week. 30 g 0  . sertraline (ZOLOFT) 100 MG tablet Take 1 tablet (100 mg total) by mouth daily. 90 tablet 0  . simvastatin (ZOCOR) 40 MG tablet TAKE 1 TABLET BY MOUTH AT  BEDTIME 90 tablet 3  . traMADol (ULTRAM) 50 MG tablet Take 1 tablet (50 mg total) by mouth every 8 (eight) hours as needed. 15 tablet 0  . Coenzyme Q10 (CO Q 10 PO) Take by mouth daily. (Patient not taking: Reported on 09/05/2019)    . cefdinir (OMNICEF) 300 MG capsule Take 1 capsule (300 mg total) by mouth 2 (two) times daily. (Patient not  taking: Reported on 09/05/2019) 10 capsule 0  . fluconazole (DIFLUCAN) 150 MG tablet Take 1 tablet (150 mg total) by mouth daily. (Patient not taking: Reported on 09/05/2019) 3 tablet 0   No facility-administered medications prior to visit.    Review of Systems  Patient denies headache, fevers, malaise, unintentional weight loss, skin rash, eye pain, sinus congestion and sinus pain, sore throat, dysphagia,  hemoptysis , cough, dyspnea, wheezing, chest pain, palpitations, orthopnea, edema, abdominal pain, nausea, melena, diarrhea, constipation, flank pain, dysuria, hematuria, urinary  Frequency, nocturia, numbness, tingling, seizures,  Focal weakness, Loss of consciousness,  Tremor, insomnia, depression, anxiety, and suicidal ideation.     Objective:  BP 124/78 (BP Location: Left Arm, Patient Position: Sitting, Cuff Size: Large)   Pulse 68   Temp 97.6 F (36.4 C) (Temporal)   Resp 15   Ht 5\' 4"  (1.626 m)   Wt 192 lb 12.8 oz (87.5 kg)   SpO2 98%   BMI 33.09 kg/m   Physical  Exam  General appearance: alert, cooperative and appears stated age Head: Normocephalic, without obvious abnormality, atraumatic Eyes: conjunctivae/corneas clear. PERRL, EOM's intact. Fundi benign. Ears: normal TM's and external ear canals both ears Nose: Nares normal. Septum midline. Mucosa normal. No drainage or sinus tenderness. Throat: lips, mucosa, and tongue normal; teeth and gums normal Neck: no adenopathy, no carotid bruit, no JVD, supple, symmetrical, trachea midline and thyroid not enlarged, symmetric, no tenderness/mass/nodules Lungs: clear to auscultation bilaterally Breasts: normal appearance, no masses or tenderness Heart: regular rate and rhythm, S1, S2 normal, no murmur, click, rub or gallop Abdomen: soft, non-tender; bowel sounds normal; no masses,  no organomegaly Extremities: extremities normal, atraumatic, no cyanosis or edema Pulses: 2+ and symmetric Skin: Skin color, texture, turgor normal. No rashes or lesions Neurologic: Alert and oriented X 3, normal strength and tone. Normal symmetric reflexes. Normal coordination and gait.     Assessment & Plan:   Problem List Items Addressed This Visit      Unprioritized   RESOLVED: Numbness and tingling of right arm   Intermittent right lower quadrant abdominal pain    Brought on by walking.  Suspect DJD hip,  Films ordered       Hypertension    Well controlled on current regimen. Renal function is overdue , no changes today.      Relevant Orders   Comprehensive metabolic panel   Hyperlipidemia LDL goal <130    Managed with zetia and simvastatin  ,  Liver enzymes are overdue.  No changes today Lab Results  Component Value Date   CHOL 162 07/12/2018   HDL 74.40 07/12/2018   LDLCALC 73 07/12/2018   LDLDIRECT 59.0 01/30/2018   TRIG 73.0 07/12/2018   CHOLHDL 2 07/12/2018   Lab Results  Component Value Date   ALT 18 07/12/2018   AST 19 07/12/2018   ALKPHOS 111 07/12/2018   BILITOT 0.4 07/12/2018          Relevant Orders   Lipid panel   Encounter for preventive health examination    age appropriate education and counseling updated, referrals for preventative services and immunizations addressed, dietary and smoking counseling addressed, most recent labs reviewed.  I have personally reviewed and have noted:  1) the patient's medical and social history 2) The pt's use of alcohol, tobacco, and illicit drugs 3) The patient's current medications and supplements 4) Functional ability including ADL's, fall risk, home safety risk, hearing and visual impairment 5) Diet and physical activities 6) Evidence for  depression or mood disorder 7) The patient's height, weight, and BMI have been recorded in the chart  I have made referrals, and provided counseling and education based on review of the above      Dysuria   Relevant Orders   Urinalysis, Routine w reflex microscopic   Urine Culture   Degenerative lumbar spinal stenosis    She was referred to Dr Vertell Limber, neurosurgery and told she was not a candidate for more surgery.  She was referred for [facility] and PT.  She has had minimal relief of pain and develops anterior thigh numbness on the right with standing > 15 minutes.  She has become quite inactive due to her pain and is scheduled for another lower level ESI next month. Will maximize tylenol,  Schedule tramadol bid and request neurosurgery records to consider 2nd opinion       CKD (chronic kidney disease) stage 3, GFR 30-59 ml/min    Other Visit Diagnoses    Right groin pain    -  Primary   Relevant Orders   DG Hip Unilat W OR W/O Pelvis 2-3 Views Right   Fatigue, unspecified type       Relevant Orders   TSH   CBC with Differential/Platelet   Breast cancer screening by mammogram       Relevant Orders   MM 3D SCREEN BREAST BILATERAL      I have discontinued Izora Gala L. Ruff's cefdinir and fluconazole. I am also having her maintain her Coenzyme Q10 (CO Q 10 PO), albuterol, Anoro Ellipta,  ondansetron, meloxicam, traMADol, gabapentin, Premarin, fluticasone, simvastatin, aspirin EC, ezetimibe, hydrochlorothiazide, losartan, sertraline, pantoprazole, allopurinol, and metoprolol succinate.  No orders of the defined types were placed in this encounter.   Medications Discontinued During This Encounter  Medication Reason  . cefdinir (OMNICEF) 300 MG capsule Completed Course  . fluconazole (DIFLUCAN) 150 MG tablet Completed Course    Follow-up: Return in about 3 months (around 12/06/2019).   Crecencio Mc, MD

## 2019-09-05 NOTE — Assessment & Plan Note (Signed)
Well controlled on current regimen. Renal function is overdue , no changes today.

## 2019-09-07 LAB — URINE CULTURE
MICRO NUMBER:: 10652822
SPECIMEN QUALITY:: ADEQUATE

## 2019-09-07 NOTE — Progress Notes (Signed)
Her hip film shows only mild degenerative changes,  no fractures, so the pain she is having is likely coming from the lumbar spinal stenosis, not from a bad hip

## 2019-09-10 DIAGNOSIS — R8271 Bacteriuria: Secondary | ICD-10-CM | POA: Insufficient documentation

## 2019-09-10 NOTE — Assessment & Plan Note (Addendum)
Colonization suggested given lack of symptoms or significant  pyuria. No treatment required

## 2019-09-20 DIAGNOSIS — M5416 Radiculopathy, lumbar region: Secondary | ICD-10-CM | POA: Diagnosis not present

## 2019-10-19 ENCOUNTER — Other Ambulatory Visit: Payer: Self-pay | Admitting: Internal Medicine

## 2019-10-19 ENCOUNTER — Other Ambulatory Visit: Payer: Self-pay | Admitting: Cardiovascular Disease

## 2019-10-19 DIAGNOSIS — E785 Hyperlipidemia, unspecified: Secondary | ICD-10-CM

## 2019-10-19 DIAGNOSIS — M25561 Pain in right knee: Secondary | ICD-10-CM

## 2019-11-19 ENCOUNTER — Telehealth: Payer: Self-pay | Admitting: Internal Medicine

## 2019-11-19 DIAGNOSIS — M5030 Other cervical disc degeneration, unspecified cervical region: Secondary | ICD-10-CM

## 2019-11-19 DIAGNOSIS — M542 Cervicalgia: Secondary | ICD-10-CM

## 2019-11-19 NOTE — Telephone Encounter (Signed)
Patient called in need a refill on traMADol (ULTRAM) 50 MG tablet she is having a lot of pain

## 2019-11-19 NOTE — Telephone Encounter (Signed)
Refill request for tramadol, last seen 09-05-19, last filled 10-30-18.  Please advise.

## 2019-11-20 ENCOUNTER — Telehealth: Payer: Self-pay | Admitting: Internal Medicine

## 2019-11-20 MED ORDER — TRAMADOL HCL 50 MG PO TABS: 50.0000 mg | ORAL_TABLET | Freq: Three times a day (TID) | ORAL | 5 refills | Status: DC | PRN

## 2019-11-20 MED ORDER — TRAMADOL HCL 50 MG PO TABS: 50.0000 mg | ORAL_TABLET | Freq: Two times a day (BID) | ORAL | 5 refills | Status: DC | PRN

## 2019-11-20 NOTE — Telephone Encounter (Signed)
Pt would like a call she would like to discuss the symptoms of covid. Please advise and Thank you!  Pt call @ [phone].

## 2019-11-21 NOTE — Telephone Encounter (Signed)
Spoke with pt and she stated that she has started having body aches and questioned whether it could be covid. Pt was advised that she should get covid tested just to be on the safe side because that is one of the symptoms of covid. Pt stated that she did not have any other symptoms. Pt was still advised to get tested. Pt then went on to ask if she could go get her hair done at the salon. I explained the covid protocol to her but pt stated that she really needed to get her hair. Dr. Derrel Nip over heard the conversation and had me transfer the call to her. Call was transferred.

## 2019-11-23 NOTE — Progress Notes (Signed)
Cardiology Office Note  Date:  11/26/2019   ID:  Jamie Anderson, DOB 1935-07-05, MRN [id number]  PCP:  Crecencio Mc, MD   Chief Complaint  Patient presents with   OTHER    6 month f/u no complaints today. Meds reviewed verbally with pt.    HPI:  Jamie Anderson is a pleasant 84 year old woman with past medical history of Hypertension Hyperlipidemia Chronic back pain, hx of back surgery Brother with CABG, CVA Sister with "open heart surg" Aortic atherosclerosis, coronary calcification on CT Who presents for follow-up of her chest pain   In follow-up today reports she is limited by Chronic back pain Had cortisone shots No exercise, sedentary Weight stable Gait instability  Lab work reviewed Total chol 163, LDL 68 On statin with Zetia  nonsmoker nondiabetetic Fostering a cat  EKG personally reviewed by myself on todays visit Shows normal sinus rhythm rate 76 bpm no significant ST or T wave changes  OTHER PAST MEDICAL HISTORY REVIEWED BY ME FOR TODAY'S VISIT:   PMH:   has a past medical history of Granuloma annulare, Hyperlipidemia, Hypertension, Menopause, and Plantar fasciitis of right foot (12/24/2014).  PSH:    Past Surgical History:  Procedure Laterality Date   APPENDECTOMY     DILATION AND CURETTAGE OF UTERUS     RIGHT OOPHORECTOMY Right feb 2014   [name]   SPINE SURGERY  2010   VENOUS ABLATION     bilateral    Current Outpatient Medications  Medication Sig Dispense Refill   albuterol (PROVENTIL HFA;VENTOLIN HFA) 108 (90 Base) MCG/ACT inhaler Inhale 1 puff into the lungs every 6 (six) hours as needed for wheezing or shortness of breath.     allopurinol (ZYLOPRIM) 100 MG tablet TAKE 1 TABLET (100 MG TOTAL) BY MOUTH DAILY. 90 tablet 0   ANORO ELLIPTA 62.5-25 MCG/INH AEPB Inhale 1 puff into the lungs daily. (Patient taking differently: Inhale 1 puff into the lungs daily as needed. ) 1 each 6   aspirin EC 81 MG tablet Take 1 tablet (81 mg total)  by mouth daily. 90 tablet 3   ezetimibe (ZETIA) 10 MG tablet TAKE 1 TABLET EVERY DAY 90 tablet 2   fluticasone (FLONASE) 50 MCG/ACT nasal spray Place 2 sprays into both nostrils daily. 48 g 3   hydrochlorothiazide (MICROZIDE) 12.5 MG capsule TAKE 1 CAPSULE (12.5 MG TOTAL) BY MOUTH DAILY. 90 capsule 0   losartan (COZAAR) 100 MG tablet TAKE 1 TABLET (100 MG TOTAL) BY MOUTH DAILY. 90 tablet 0   metoprolol succinate (TOPROL-XL) 25 MG 24 hr tablet TAKE 1 TABLET (25 MG TOTAL) BY MOUTH DAILY. 90 tablet 0   pantoprazole (PROTONIX) 40 MG tablet TAKE 1 TABLET (40 MG TOTAL) BY MOUTH DAILY. 90 tablet 0   PREMARIN vaginal cream Place 1 Applicatorful vaginally 2 (two) times a week. 30 g 0   sertraline (ZOLOFT) 100 MG tablet TAKE 1 TABLET EVERY DAY 90 tablet 0   simvastatin (ZOCOR) 40 MG tablet TAKE 1 TABLET BY MOUTH AT  BEDTIME 90 tablet 3   traMADol (ULTRAM) 50 MG tablet Take 1 tablet (50 mg total) by mouth every 12 (twelve) hours as needed for moderate pain. 60 tablet 5   Coenzyme Q10 (CO Q 10 PO) Take by mouth daily. (Patient not taking: Reported on 11/26/2019)     No current facility-administered medications for this visit.     Allergies:   Biaxin [clarithromycin], Colloidal oatmeal, Corn-containing products, Dust mite extract, Gluten meal, Oat, and Shrimp [  shellfish allergy]   Social History:  The patient  reports that she has never smoked. She has never used smokeless tobacco. She reports that she does not drink alcohol and does not use drugs.   Family History:   family history includes Cancer in her brother; Early death in her mother; Heart attack in her father; Heart disease in her brother, father, and sister.    Review of Systems: Review of Systems  Constitutional: Negative.   Eyes: Negative.   Respiratory: Negative.   Cardiovascular: Negative.   Gastrointestinal: Negative.   Genitourinary: Negative.   Musculoskeletal: Positive for back pain and neck pain.  Neurological:  Negative.   Psychiatric/Behavioral: Negative.   All other systems reviewed and are negative.   PHYSICAL EXAM: VS:  BP 120/60 (BP Location: Left Arm, Patient Position: Sitting, Cuff Size: Normal)    Pulse 76    Ht 5\' 4"  (1.626 m)    Wt 193 lb 6 oz (87.7 kg)    SpO2 98%    BMI 33.19 kg/m  , BMI Body mass index is 33.19 kg/m. Constitutional:  oriented to person, place, and time. No distress.  HENT:  Head: Grossly normal Eyes:  no discharge. No scleral icterus.  Neck: No JVD, no carotid bruits  Cardiovascular: Regular rate and rhythm, no murmurs appreciated Pulmonary/Chest: Clear to auscultation bilaterally, no wheezes or rails Abdominal: Soft.  no distension.  no tenderness.  Musculoskeletal: Normal range of motion Neurological:  normal muscle tone. Coordination normal. No atrophy Skin: Skin warm and dry Psychiatric: normal affect, pleasant  Recent Labs: 09/05/2019: ALT 19; BUN 23; Creatinine, Ser 0.95; Hemoglobin 12.4; Platelets 181.0; Potassium 4.3; Sodium 141; TSH 1.62    Lipid Panel Lab Results  Component Value Date   CHOL 163 09/05/2019   HDL 79.40 09/05/2019   LDLCALC 68 09/05/2019   TRIG 76.0 09/05/2019    Wt Readings from Last 3 Encounters:  11/26/19 193 lb 6 oz (87.7 kg)  09/05/19 192 lb 12.8 oz (87.5 kg)  08/22/19 192 lb (87.1 kg)     ASSESSMENT AND PLAN:  Atypical chest pain  Denies significant symptoms, no further testing ordered at this time  Aortic atherosclerosis (HCC)  Moderate aortic atherosclerosis Cholesterol at goal  Essential hypertension Blood pressure is well controlled on today's visit. No changes made to the medications.  Hyperlipidemia Continue statin with Zetia  Shortness of breath  deconditioning, obesity  Recommend walking program, weight loss   Total encounter time more than 25 minutes Greater than 50% was spent in counseling and coordination of care with the patient   Signed, Esmond Plants, M.D., Ph.D. 11/26/2019  [place], [place]

## 2019-11-26 ENCOUNTER — Ambulatory Visit: Payer: Medicare HMO | Admitting: Cardiovascular Disease

## 2019-11-26 ENCOUNTER — Encounter: Payer: Self-pay | Admitting: Cardiovascular Disease

## 2019-11-26 ENCOUNTER — Other Ambulatory Visit: Payer: Self-pay

## 2019-11-26 VITALS — BP 120/60 | HR 76 | Ht 64.0 in | Wt 193.4 lb

## 2019-11-26 DIAGNOSIS — I700 Atherosclerosis of aorta: Secondary | ICD-10-CM

## 2019-11-26 DIAGNOSIS — E785 Hyperlipidemia, unspecified: Secondary | ICD-10-CM | POA: Diagnosis not present

## 2019-11-26 DIAGNOSIS — I10 Essential (primary) hypertension: Secondary | ICD-10-CM | POA: Diagnosis not present

## 2019-11-26 NOTE — Patient Instructions (Signed)
Medication Instructions:  No changes  If you need a refill on your cardiac medications before your next appointment, please call your pharmacy.    Lab work: No new labs needed   If you have labs (blood work) drawn today and your tests are completely normal, you will receive your results only by: . MyChart Message (if you have MyChart) OR . A paper copy in the mail If you have any lab test that is abnormal or we need to change your treatment, we will call you to review the results.   Testing/Procedures: No new testing needed   Follow-Up: At [facility], you and your health needs are our priority.  As part of our continuing mission to provide you with exceptional heart care, we have created designated Provider Care Teams.  These Care Teams include your primary Cardiologist (physician) and Advanced Practice Providers (APPs -  Physician Assistants and Nurse Practitioners) who all work together to provide you with the care you need, when you need it.  . You will need a follow up appointment in 12 months  . Providers on your designated Care Team:   . Christopher Berge, NP . Ryan Dunn, PA-C . Jacquelyn Visser, PA-C  Any Other Special Instructions Will Be Listed Below (If Applicable).  COVID-19 Vaccine Information can be found at: [email or web address] For questions related to vaccine distribution or appointments, please email [email or web address] or call [phone].     

## 2019-12-04 ENCOUNTER — Encounter: Payer: Self-pay | Admitting: Internal Medicine

## 2019-12-04 ENCOUNTER — Ambulatory Visit: Payer: Medicare HMO | Admitting: Internal Medicine

## 2019-12-04 ENCOUNTER — Other Ambulatory Visit: Payer: Self-pay

## 2019-12-04 VITALS — BP 136/66 | HR 65 | Temp 97.5°F | Ht 65.0 in | Wt 196.0 lb

## 2019-12-04 DIAGNOSIS — J4520 Mild intermittent asthma, uncomplicated: Secondary | ICD-10-CM | POA: Diagnosis not present

## 2019-12-04 DIAGNOSIS — M170 Bilateral primary osteoarthritis of knee: Secondary | ICD-10-CM | POA: Diagnosis not present

## 2019-12-04 DIAGNOSIS — M1711 Unilateral primary osteoarthritis, right knee: Secondary | ICD-10-CM | POA: Diagnosis not present

## 2019-12-04 MED ORDER — ALBUTEROL SULFATE HFA 108 (90 BASE) MCG/ACT IN AERS: 1.0000 | INHALATION_SPRAY | Freq: Four times a day (QID) | RESPIRATORY_TRACT | 10 refills | Status: DC | PRN

## 2019-12-04 NOTE — Patient Instructions (Signed)
Albuterol as needed Avoid sick contacts Avoid secondhand smoke exposure

## 2019-12-04 NOTE — Progress Notes (Signed)
Name: Jamie Anderson MRN: [id number] DOB: [birth year]     CONSULTATION DATE: 4.8.19 REFERRING MD : Derrel Nip   STUDIES:    11.18.18 CT chest Independently reviewed by Me No acute process No pneumonia No masses seen  CHIEF COMPLAINT: follow up SOB and COPD  HISTORY OF PRESENT ILLNESS: No signs of shortness of breath No dyspnea exertion No wheezing   No evidence of heart failure at this time No evidence or signs of infection at this time No respiratory distress No fevers, chills, nausea, vomiting, diarrhea No evidence of lower extremity edema No evidence hemoptysis    PAST MEDICAL HISTORY :   has a past medical history of Granuloma annulare, Hyperlipidemia, Hypertension, Menopause, and Plantar fasciitis of right foot (12/24/2014).  has a past surgical history that includes Spine surgery (2010); Appendectomy; Venous ablation; Dilation and curettage of uterus; and Right oophorectomy (Right, feb 2014). Prior to Admission medications   Medication Sig Start Date End Date Taking? Authorizing Provider  albuterol (PROVENTIL HFA;VENTOLIN HFA) 108 (90 Base) MCG/ACT inhaler Inhale 1 puff into the lungs every 6 (six) hours as needed for wheezing or shortness of breath.   Yes [provider]  allopurinol (ZYLOPRIM) 100 MG tablet TAKE 1 TABLET BY MOUTH  DAILY 12/27/16  Yes Crecencio Mc, MD  Coenzyme Q10 (CO Q 10 PO) Take by mouth daily.   Yes [provider]  conjugated estrogens (PREMARIN) vaginal cream Place 1 Applicatorful vaginally 2 (two) times a week. 01/19/16  Yes Crecencio Mc, MD  ezetimibe (ZETIA) 10 MG tablet Take 1 tablet (10 mg total) by mouth daily. 03/22/17  Yes Gollan, Kathlene November, MD  fluticasone (FLONASE) 50 MCG/ACT nasal spray Place 2 sprays into both nostrils daily. 06/25/16  Yes Crecencio Mc, MD  HYDROcodone-acetaminophen (NORCO/VICODIN) 5-325 MG tablet Take 1 tablet every 6 (six) hours as needed by mouth. 01/18/17  Yes Harvest Dark, MD    losartan-hydrochlorothiazide (HYZAAR) 100-12.5 MG tablet Take 1 tablet by mouth daily. 06/06/17  Yes Crecencio Mc, MD  meloxicam (MOBIC) 15 MG tablet TAKE 1 TABLET BY MOUTH  DAILY 03/02/17  Yes Crecencio Mc, MD  metoprolol succinate (TOPROL-XL) 25 MG 24 hr tablet Take 1 tablet (25 mg total) by mouth daily. 06/07/17  Yes Crecencio Mc, MD  pantoprazole (PROTONIX) 40 MG tablet TAKE 1 TABLET BY MOUTH  DAILY 04/11/17  Yes Crecencio Mc, MD  sertraline (ZOLOFT) 100 MG tablet TAKE 1 TABLET BY MOUTH  EVERY DAY 02/21/17  Yes Crecencio Mc, MD  simvastatin (ZOCOR) 40 MG tablet TAKE 1 TABLET BY MOUTH AT  BEDTIME 05/11/17  Yes Crecencio Mc, MD   Allergies  Allergen Reactions  . Biaxin [Clarithromycin]   . Colloidal Oatmeal   . Corn-Containing Products   . Dust Mite Extract     Almonds,Mold Spores  . Gluten Meal   . Oat   . Shrimp [Shellfish Allergy]     FAMILY HISTORY:  family history includes Cancer in her brother; Early death in her mother; Heart attack in her father; Heart disease in her brother, father, and sister. SOCIAL HISTORY:  reports that she has never smoked. She has never used smokeless tobacco. She reports that she does not drink alcohol and does not use drugs.   Review of Systems:  Gen:  Denies  fever, sweats, chills weight loss  HEENT: Denies blurred vision, double vision, ear pain, eye pain, hearing loss, nose bleeds, sore throat Cardiac:  No dizziness, chest pain  or heaviness, chest tightness,edema, No JVD Resp:   No cough, -sputum production, -shortness of breath,-wheezing, -hemoptysis,  Gi: Denies swallowing difficulty, stomach pain, nausea or vomiting, diarrhea, constipation, bowel incontinence Other:  All other systems negative    BP 136/66 (BP Location: Left Arm, Patient Position: Sitting, Cuff Size: Normal)   Pulse 65   Temp (!) 97.5 F (36.4 C) (Temporal)   Ht 5\' 5"  (1.651 m)   Wt 196 lb (88.9 kg)   SpO2 97%   BMI 32.62 kg/m   Physical Examination:    General Appearance: No distress  Neuro:without focal findings,  speech normal,  HEENT: PERRLA, EOM intact.   Pulmonary: normal breath sounds, No wheezing.  CardiovascularNormal S1,S2.  No m/r/g.   Abdomen: Benign, Soft, non-tender. Renal:  No costovertebral tenderness  GU:  Not performed at this time. Endoc: No evident thyromegaly Skin:   warm, no rashes, no ecchymosis  Extremities: normal, no cyanosis, clubbing. PSYCHIATRIC: Mood, affect within normal limits.   ALL OTHER ROS ARE NEGATIVE   ASSESSMENT / PLAN:   84 year old pleasant white female seen today for intermittent shortness of breath and wheezing with a significant history of secondhand smoke exposure and environmental exposure to upholstery with intermittent signs of reactive airways disease   Pulmonary function test does not show any evidence of obstruction  ALBUTEROL AS NEEDED  COVID-19 EDUCATION: The signs and symptoms of COVID-19 were discussed with the patient and how to seek care for testing.  The importance of social distancing was discussed today. Hand Washing Techniques and avoid touching face was advised.     MEDICATION ADJUSTMENTS/LABS AND TESTS ORDERED:    CURRENT MEDICATIONS REVIEWED AT LENGTH WITH PATIENT TODAY   Patient satisfied with Plan of action and management. All questions answered  Follow up in 1 year  Total time spent 18 mins  Jamine Highfill Patricia Pesa, M.D.  [facility]  Medical Director [place] Director [facility] Cardio-Pulmonary Department

## 2019-12-06 ENCOUNTER — Ambulatory Visit
Admission: RE | Admit: 2019-12-06 | Discharge: 2019-12-06 | Disposition: A | Discharge status: Home / Self Care | Payer: Medicare HMO | Source: Ambulatory Visit | Attending: Internal Medicine | Admitting: Internal Medicine

## 2019-12-06 ENCOUNTER — Other Ambulatory Visit: Payer: Self-pay

## 2019-12-06 DIAGNOSIS — Z1231 Encounter for screening mammogram for malignant neoplasm of breast: Secondary | ICD-10-CM | POA: Diagnosis not present

## 2019-12-07 ENCOUNTER — Telehealth (INDEPENDENT_AMBULATORY_CARE_PROVIDER_SITE_OTHER): Payer: Medicare HMO | Admitting: Internal Medicine

## 2019-12-07 ENCOUNTER — Encounter: Payer: Self-pay | Admitting: Internal Medicine

## 2019-12-07 DIAGNOSIS — I10 Essential (primary) hypertension: Secondary | ICD-10-CM

## 2019-12-07 DIAGNOSIS — M48061 Spinal stenosis, lumbar region without neurogenic claudication: Secondary | ICD-10-CM | POA: Diagnosis not present

## 2019-12-07 MED ORDER — PREDNISONE 10 MG PO TABS: ORAL_TABLET | ORAL | 0 refills | Status: DC

## 2019-12-07 NOTE — Assessment & Plan Note (Signed)
Managed with ESI.  Last one July 15 helped but she missed the telephone call in August so has not been able to schedule her next one and is in pan.. prednisone taper sent to pharmacy.  Continue tramadol qh ,  1/2 during day along with tylenol.  NSIADS are C/I due to CKD

## 2019-12-07 NOTE — Progress Notes (Signed)
Telephone Note  This visit type was conducted due to national recommendations for restrictions regarding the COVID-19 pandemic (e.g. social distancing).  This format is felt to be most appropriate for this patient at this time.  All issues noted in this document were discussed and addressed.  No physical exam was performed (except for noted visual exam findings with Video Visits).   I connected with@ on 12/07/19 at  3:00 PM EDT by telephone and verified that I am speaking with the correct person using two identifiers. Location patient: home Location provider: work or home office Persons participating in the virtual visit: patient, provider  I discussed the limitations, risks, security and privacy concerns of performing an evaluation and management service by telephone and the availability of in person appointments. I also discussed with the patient that there may be a patient responsible charge related to this service. The patient expressed understanding and agreed to proceed.  Reason for visit: follow up  HPI:  84 yr old female with lumbar spinal stenosis,  Chronic pain  Managed with ESI,  Told she was not a candidate for surgery.  Has been waiting for rescheduling of next appt .  Last one was done July 15  by  Dr Maryjean Ka , about 2 months .  Missed the telephone visit/call in August 25,  And d has left them two messages about rescheduling but has not received a call back.  [place]  [facility]   [phone] . Her sciatica has been Limiting her mobility due to pain being aggravated by walking .  Taking tylenol 650 mg every 8 hours and tramadol at night, none during the day   Had knee injection yesterday  By orthopedist   BP:  Checks BP rarely ,  Readings have  Been < 140/80   Aortic atherosclerosis:  Reviewed findings of prior CT scan today..  Patient is tolerating  therapy (zocor and zetia   ROS: See pertinent positives and negatives per HPI.  Past Medical History:  Diagnosis Date  . Basal  cell carcinoma 01/17/2018   Left chest. Infiltrative. Excised 02/06/2018, margins free.  Marland Kitchen Cystic teratoma of right ovary 11/23/2012   She underwent right salpingo-oophorectomy on April 1 by Dr. Kenton Kingfisher for removal of what turned out to be a cystic teratoma. Hysteroscopy was also done at the time due to postmenopausal bleeding. It is unclear  why the left ovary was not taken out since she is postmenopausal.   . Granuloma annulare   . Hyperlipidemia   . Hypertension   . Menopause   . Plantar fasciitis of right foot 12/24/2014    Past Surgical History:  Procedure Laterality Date  . APPENDECTOMY    . DILATION AND CURETTAGE OF UTERUS    . RIGHT OOPHORECTOMY Right feb 2014   [name]  . [place]  2010  . VENOUS ABLATION     bilateral    Family History  Problem Relation Age of Onset  . Early death Mother        durig childbirth  . Heart disease Father   . Heart attack Father   . Heart disease Sister   . Cancer Brother   . Heart disease Brother        Afib  . Breast cancer Neg Hx     SOCIAL HX:  reports that she has never smoked. She has never used smokeless tobacco. She reports that she does not drink alcohol and does not use drugs.   Current Outpatient Medications:  .  albuterol (VENTOLIN HFA) 108 (90 Base) MCG/ACT inhaler, Inhale 1 puff into the lungs every 6 (six) hours as needed for wheezing or shortness of breath., Disp: 1 each, Rfl: 10 .  allopurinol (ZYLOPRIM) 100 MG tablet, TAKE 1 TABLET (100 MG TOTAL) BY MOUTH DAILY., Disp: 90 tablet, Rfl: 0 .  ANORO ELLIPTA 62.5-25 MCG/INH AEPB, Inhale 1 puff into the lungs daily. (Patient taking differently: Inhale 1 puff into the lungs daily as needed. ), Disp: 1 each, Rfl: 6 .  aspirin EC 81 MG tablet, Take 1 tablet (81 mg total) by mouth daily., Disp: 90 tablet, Rfl: 3 .  clobetasol cream (TEMOVATE) 0.05 %, , Disp: , Rfl:  .  Coenzyme Q10 (CO Q 10 PO), Take by mouth daily. , Disp: , Rfl:  .  ezetimibe (ZETIA) 10 MG tablet, TAKE 1  TABLET EVERY DAY, Disp: 90 tablet, Rfl: 2 .  fluticasone (FLONASE) 50 MCG/ACT nasal spray, Place 2 sprays into both nostrils daily., Disp: 48 g, Rfl: 3 .  hydrochlorothiazide (MICROZIDE) 12.5 MG capsule, TAKE 1 CAPSULE (12.5 MG TOTAL) BY MOUTH DAILY., Disp: 90 capsule, Rfl: 0 .  losartan (COZAAR) 100 MG tablet, TAKE 1 TABLET (100 MG TOTAL) BY MOUTH DAILY., Disp: 90 tablet, Rfl: 0 .  metoprolol succinate (TOPROL-XL) 25 MG 24 hr tablet, TAKE 1 TABLET (25 MG TOTAL) BY MOUTH DAILY., Disp: 90 tablet, Rfl: 0 .  pantoprazole (PROTONIX) 40 MG tablet, TAKE 1 TABLET (40 MG TOTAL) BY MOUTH DAILY., Disp: 90 tablet, Rfl: 0 .  PREMARIN vaginal cream, Place 1 Applicatorful vaginally 2 (two) times a week., Disp: 30 g, Rfl: 0 .  sertraline (ZOLOFT) 100 MG tablet, TAKE 1 TABLET EVERY DAY, Disp: 90 tablet, Rfl: 0 .  simvastatin (ZOCOR) 40 MG tablet, TAKE 1 TABLET BY MOUTH AT  BEDTIME, Disp: 90 tablet, Rfl: 3 .  traMADol (ULTRAM) 50 MG tablet, Take 1 tablet (50 mg total) by mouth every 12 (twelve) hours as needed for moderate pain., Disp: 60 tablet, Rfl: 5 .  predniSONE (DELTASONE) 10 MG tablet, 6 tablets on Day 1 , then reduce by 1 tablet daily until gone, Disp: 21 tablet, Rfl: 0  EXAM:  VITALS per patient if applicable:   General impression: alert, cooperative and articulate.  No signs of being in distress  Lungs: speech is fluent sentence length suggests that patient is not short of breath and not punctuated by cough, sneezing or sniffing. Marland Kitchen   Psych: affect normal.  speech is articulate and non pressured .  Denies suicidal thoughts   ASSESSMENT AND PLAN:  Discussed the following assessment and plan:  Degenerative lumbar spinal stenosis  Primary hypertension  Degenerative lumbar spinal stenosis Managed with ESI.  Last one July 15 helped but she missed the telephone call in August so has not been able to schedule her next one and is in pan.. prednisone taper sent to pharmacy.  Continue tramadol qh ,  1/2  during day along with tylenol.  NSIADS are C/I due to CKD   Hypertension Well controlled on current regimen of  hctz 12.5 mg losartan 100 mg , and metoprolol  25 mg daily based on home readings.  Renal function stable.   Lab Results  Component Value Date   CREATININE 0.95 09/05/2019   Lab Results  Component Value Date   NA 141 09/05/2019   K 4.3 09/05/2019   CL 104 09/05/2019   CO2 30 09/05/2019       I discussed the assessment and treatment plan  with the patient. The patient was provided an opportunity to ask questions and all were answered. The patient agreed with the plan and demonstrated an understanding of the instructions.   The patient was advised to call back or seek an in-person evaluation if the symptoms worsen or if the condition fails to improve as anticipated.  I provided 30  minutes of non-face-to-face time during this encounter.   Crecencio Mc, MD

## 2019-12-09 ENCOUNTER — Encounter: Payer: Self-pay | Admitting: Internal Medicine

## 2019-12-09 NOTE — Assessment & Plan Note (Signed)
Well controlled on current regimen of  hctz 12.5 mg losartan 100 mg , and metoprolol  25 mg daily based on home readings.  Renal function stable.   Lab Results  Component Value Date   CREATININE 0.95 09/05/2019   Lab Results  Component Value Date   NA 141 09/05/2019   K 4.3 09/05/2019   CL 104 09/05/2019   CO2 30 09/05/2019

## 2019-12-11 ENCOUNTER — Ambulatory Visit: Payer: Medicare Other | Admitting: Dermatology

## 2019-12-11 ENCOUNTER — Other Ambulatory Visit: Payer: Self-pay

## 2019-12-11 DIAGNOSIS — Z1283 Encounter for screening for malignant neoplasm of skin: Secondary | ICD-10-CM | POA: Diagnosis not present

## 2019-12-11 DIAGNOSIS — L578 Other skin changes due to chronic exposure to nonionizing radiation: Secondary | ICD-10-CM

## 2019-12-11 DIAGNOSIS — L821 Other seborrheic keratosis: Secondary | ICD-10-CM

## 2019-12-11 DIAGNOSIS — L920 Granuloma annulare: Secondary | ICD-10-CM

## 2019-12-11 DIAGNOSIS — Z85828 Personal history of other malignant neoplasm of skin: Secondary | ICD-10-CM

## 2019-12-11 DIAGNOSIS — I8390 Asymptomatic varicose veins of unspecified lower extremity: Secondary | ICD-10-CM

## 2019-12-11 DIAGNOSIS — D1800 Hemangioma unspecified site: Secondary | ICD-10-CM

## 2019-12-11 DIAGNOSIS — L720 Epidermal cyst: Secondary | ICD-10-CM | POA: Diagnosis not present

## 2019-12-11 DIAGNOSIS — L299 Pruritus, unspecified: Secondary | ICD-10-CM

## 2019-12-11 DIAGNOSIS — L820 Inflamed seborrheic keratosis: Secondary | ICD-10-CM | POA: Diagnosis not present

## 2019-12-11 DIAGNOSIS — L918 Other hypertrophic disorders of the skin: Secondary | ICD-10-CM

## 2019-12-11 NOTE — Patient Instructions (Signed)
Cryotherapy Aftercare  . Wash gently with soap and water everyday.   . Apply Vaseline and Band-Aid daily until healed.  

## 2019-12-11 NOTE — Progress Notes (Signed)
Follow-Up Visit   Subjective  Jamie Anderson is a 84 y.o. female who presents for the following: Annual Exam (Hx BCC of the left chest) and Follow-up (ISKs.). Patient has a few bothersome spots on right forearm, legs, scalp. She has granuloma annulare of the left hip and uses clobetasol cream twice daily 3-4 times a week prn itch.  She has been using a topical steroid on this as needed for itch for years.   The following portions of the chart were reviewed this encounter and updated as appropriate:      Review of Systems:  No other skin or systemic complaints except as noted in HPI or Assessment and Plan.  Objective  Well appearing patient in no apparent distress; mood and affect are within normal limits.  A full examination was performed including scalp, head, eyes, ears, nose, lips, neck, chest, axillae, abdomen, back, buttocks, bilateral upper extremities, bilateral lower extremities, hands, feet, fingers, toes, fingernails, and toenails. All findings within normal limits unless otherwise noted below.  Objective  Left Lateral Thigh and Hip: Mild erythema, with possible mild skin atrophy  Objective  L Lateral Thigh x 4, L pretibia x 2, R wrist x 1: Erythematous keratotic or waxy stuck-on papule    Objective  Left Vertex: Firm white papule- 3 mm- Irritated, itches   Assessment & Plan   Skin cancer screening performed today.  Actinic Damage - diffuse scaly erythematous macules with underlying dyspigmentation - Recommend daily broad spectrum sunscreen SPF 30+ to sun-exposed areas, reapply every 2 hours as needed.  - Call for new or changing lesions.  History of Basal Cell Carcinoma of the Skin - No evidence of recurrence today, left chest - Recommend regular full body skin exams - Recommend daily broad spectrum sunscreen SPF 30+ to sun-exposed areas, reapply every 2 hours as needed.  - Call if any new or changing lesions are noted between office visits  Seborrheic  Keratoses - Stuck-on, waxy, tan-brown papules and plaques  - Discussed benign etiology and prognosis. - Observe - Call for any changes  Spider Veins - Dilated blue, purple or red veins at the lower extremities - Reassured - These can be treated by sclerotherapy (a procedure to inject a medicine into the veins to make them disappear) if desired, but the treatment is not covered by insurance  Hemangiomas - Red papules - Discussed benign nature - Observe - Call for any changes  Acrochordons (Skin Tags) - Fleshy, skin-colored pedunculated papules - Benign appearing.  - Observe. - If desired, they can be removed with an in office procedure that may not be covered by insurance. - Please call the clinic if you notice any new or changing lesions.   Granuloma annulare Left Lateral Thigh and Hip  Bx proven, with pruritus D/c Clobetasol Cream due to possible mild skin atrophy Start Eucrisa Ointment BID prn itch - samples given x 6. Lot No. RMDN Exp 7/22  If helpful, pt will call for RX  Also, 4 ISKs were treated with cryotherapy today on her L lateral thigh, and she will see if that helps reduce the itching in that area.     Inflamed seborrheic keratosis L Lateral Thigh x 4, L pretibia x 2, R wrist x 1  Recheck right wrist on follow-up.  Destruction of lesion - L Lateral Thigh x 4, L pretibia x 2, R wrist x 1  Destruction method: cryotherapy   Informed consent: discussed and consent obtained   Lesion destroyed using liquid  nitrogen: Yes   Region frozen until ice ball extended beyond lesion: Yes   Outcome: patient tolerated procedure well with no complications   Post-procedure details: wound care instructions given    Milia Left Vertex  symptomatic    Incision and Drainage - Left Vertex Location: Left vertex  Informed Consent: Discussed risks (permanent scarring, light or dark discoloration, infection, pain, bleeding, bruising, redness, damage to adjacent  structures, and recurrence of the lesion) and benefits of the procedure, as well as the alternatives.  Informed consent was obtained.  Preparation: The area was prepped with alcohol.  Anesthesia: Lidocaine 1% without epinephrine  Procedure Details: An incision was made overlying the lesion. The lesion drained white cheesy material   Antibiotic ointment and a sterile pressure dressing were applied. The patient tolerated procedure well.  Total number of lesions drained: 1  Plan: The patient was instructed on post-op care. Recommend OTC analgesia as needed for pain.   Return in about 2 months (around 02/10/2020) for ISKs.   Documentation: I have reviewed the above documentation for accuracy and completeness, and I agree with the above.  Brendolyn Patty MD

## 2019-12-17 ENCOUNTER — Telehealth: Payer: Self-pay | Admitting: Internal Medicine

## 2019-12-17 NOTE — Telephone Encounter (Signed)
Patient was instructed by Access nurse to go to ED/UC, due to no available appointments. Per note patient stated they will comply with Nurse.     [place] RECORD AccessNurse Patient Name: Jamie Anderson Gender: Female DOB: [birth year] Age: 84 Y 5 M 24 D Return Phone Number: [phone] (Primary), [phone] (Secondary) Address: [address] City/State/Zip: [place] [address] Client [facility] - [facility] Client Site [facility] - Day Physician Deborra Medina - MD Contact Type Call Who Is Calling Patient / Member / Family / Caregiver Call Type Triage / Clinical Relationship To Patient Self Return Phone Number [phone] (Primary) Chief Complaint CHEST PAIN (>=21 years) - pain, pressure, heaviness or tightness Reason for Call Symptomatic / Request for [place] stated her mouth and jaw are sore, having lower breast and upper abdominal pain. Translation No Nurse Assessment Nurse: Ronnald Ramp, RN, Miranda Date/Time (Eastern Time): 12/17/2019 2:23:03 PM Confirm and document reason for call. If symptomatic, describe symptoms. ---Caller states the end of her tongue and top of her mouth is raw since last week. She is also having right upper abdominal pain since Saturday but getting worse. Does the patient have any new or worsening symptoms? ---Yes Will a triage be completed? ---Yes Related visit to physician within the last 2 weeks? ---No Does the PT have any chronic conditions? (i.e. diabetes, asthma, this includes High risk factors for pregnancy, etc.) ---Yes List chronic conditions. ---HTN, High Cholesterol, GERD, Anxiety Is this a behavioral health or substance abuse call? ---No Guidelines Guideline Title Affirmed Question Affirmed Notes Nurse Date/Time (Eastern Time) Abdominal Pain - Upper [1] Pain lasts > 10 minutes AND  [2] age > 81 Ronnald Ramp, RN, Miranda 12/17/2019 2:25:25 PM Mouth Pain [1] Mouth pain AND [2] present < 3 days Ronnald Ramp, RN, Miranda 12/17/2019 2:31:18 PM Disp. Time Eilene Ghazi Time) Disposition Final User 12/17/2019 2:18:41 PM Send to Urgent Queue Cherlynn Perches 12/17/2019 2:30:54 PM Go to ED Now Ronnald Ramp, RN, Miranda 12/17/2019 2:39:07 PM Home Care Yes Ronnald Ramp, RN, Miranda PLEASE NOTE: All timestamps contained within this report are represented as [place] Standard Time. CONFIDENTIALTY NOTICE: This fax transmission is intended only for the addressee. It contains information that is legally privileged, confidential or otherwise protected from use or disclosure. If you are not the intended recipient, you are strictly prohibited from reviewing, disclosing, copying using or disseminating any of this information or taking any action in reliance on or regarding this information. If you have received this fax in error, please notify us immediately by telephone so that we can arrange for its return to [place]. Phone: [phone], Toll-Free: [phone], Fax: [phone] Page: 2 of 2 Call Id: [id number] [place] Disagree/Comply Comply Caller Understands Yes PreDisposition Call Doctor Care Advice Given Per Guideline GO TO ED NOW: * You need to be seen in the Emergency Department. * Go to the ED at ___________ [place] now. Drive carefully. DRIVING: * Another adult should drive. * Do not delay going to the Emergency Department. BRING MEDICINES: * Bring a list of your current medicines when you go to the Emergency Department (ER). * Bring the pill bottles too. This will help the doctor (or NP/PA) to make certain you are taking the right medicines and the right dose. CARE ADVICE given per Abdominal Pain, Upper (Adult) guideline. HOME CARE: * You should be able to treat this at home. SALT AND SODA MOUTHWASH: * You can  make a good mouthwash yourself. Place 1/2 tsp of salt plus 1/2 tsp of baking soda in a cup (240 ml)  of warm water. * Rinse your mouth every 2-3 hours while awake. CALL BACK IF: * You become worse CARE ADVICE given per Mouth Pain (Adult) guideline. Referrals GO TO FACILITY UNDECIDED

## 2019-12-17 NOTE — Telephone Encounter (Signed)
Transferred to Access Nurse   Pt called saying that her mouth and jaw are sore and that she is having lower breast pain and up abdominal pain

## 2019-12-18 DIAGNOSIS — R1011 Right upper quadrant pain: Secondary | ICD-10-CM | POA: Diagnosis not present

## 2019-12-19 ENCOUNTER — Other Ambulatory Visit: Payer: Self-pay | Admitting: Family Medicine

## 2019-12-19 DIAGNOSIS — R1011 Right upper quadrant pain: Secondary | ICD-10-CM

## 2019-12-24 ENCOUNTER — Telehealth: Payer: Self-pay

## 2019-12-24 ENCOUNTER — Other Ambulatory Visit: Payer: Self-pay

## 2019-12-24 ENCOUNTER — Ambulatory Visit
Admission: RE | Admit: 2019-12-24 | Discharge: 2019-12-24 | Disposition: A | Discharge status: Home / Self Care | Payer: Medicare HMO | Source: Ambulatory Visit | Attending: Family Medicine | Admitting: Family Medicine

## 2019-12-24 DIAGNOSIS — K7689 Other specified diseases of liver: Secondary | ICD-10-CM | POA: Diagnosis not present

## 2019-12-24 DIAGNOSIS — R1011 Right upper quadrant pain: Secondary | ICD-10-CM | POA: Diagnosis not present

## 2019-12-24 MED ORDER — SIMVASTATIN 40 MG PO TABS
40.0000 mg | ORAL_TABLET | Freq: Every day | ORAL | 3 refills | Status: DC
Start: 2019-12-24 — End: 2020-06-13

## 2019-12-24 NOTE — Telephone Encounter (Signed)
Pt states that [facility] has not sent her refill on simvastatin (ZOCOR) 40 MG tablet and other pill that goes with it? She did not know the name-Pt has four pills left.

## 2019-12-24 NOTE — Telephone Encounter (Signed)
Spoke with pt to let her know that I sent in the rx for the simvastatin and also let her know that there should be refills of the zetia already at the pharmacy. Pt was advised that she should give [facility] a call to see if they can refill her rx. Pt gave a verbal understanding.

## 2019-12-27 ENCOUNTER — Other Ambulatory Visit: Payer: Self-pay | Admitting: Internal Medicine

## 2019-12-27 DIAGNOSIS — M25561 Pain in right knee: Secondary | ICD-10-CM

## 2020-02-26 ENCOUNTER — Other Ambulatory Visit: Payer: Self-pay

## 2020-02-26 ENCOUNTER — Ambulatory Visit: Payer: Medicare HMO | Admitting: Dermatology

## 2020-02-26 DIAGNOSIS — L578 Other skin changes due to chronic exposure to nonionizing radiation: Secondary | ICD-10-CM | POA: Diagnosis not present

## 2020-02-26 DIAGNOSIS — D485 Neoplasm of uncertain behavior of skin: Secondary | ICD-10-CM

## 2020-02-26 DIAGNOSIS — C44722 Squamous cell carcinoma of skin of right lower limb, including hip: Secondary | ICD-10-CM

## 2020-02-26 DIAGNOSIS — C4492 Squamous cell carcinoma of skin, unspecified: Secondary | ICD-10-CM

## 2020-02-26 DIAGNOSIS — L820 Inflamed seborrheic keratosis: Secondary | ICD-10-CM

## 2020-02-26 DIAGNOSIS — L300 Nummular dermatitis: Secondary | ICD-10-CM

## 2020-02-26 HISTORY — DX: Squamous cell carcinoma of skin, unspecified: C44.92

## 2020-02-26 NOTE — Progress Notes (Signed)
Follow-Up Visit   Subjective  Jamie Anderson is a 85 y.o. female who presents for the following: Follow-up (Recheck ISK of the right wrist, improved per patient. She has a couple of new spots to check on right lower leg and right forehead at hairline.). That won't heal, that are itchy.   The following portions of the chart were reviewed this encounter and updated as appropriate:       Review of Systems:  No other skin or systemic complaints except as noted in HPI or Assessment and Plan.  Objective  Well appearing patient in no apparent distress; mood and affect are within normal limits.  A focused examination was performed including face, arms, legs. Relevant physical exam findings are noted in the Assessment and Plan.  Objective  Right Lower Pretibial: R lower pretibia 1.1cm crusted pink nodule Isk vs PN r/o scc       Objective  Right Frontal Scalp Hairline x 1, L hip x 2 (residual) (3): Erythematous keratotic or waxy stuck-on papule   Objective  Left Forearm, L mid back: Pink scaly patches with excoriations.   Assessment & Plan   Actinic Damage - chronic, secondary to cumulative UV radiation exposure/sun exposure over time - diffuse scaly erythematous macules with underlying dyspigmentation - Recommend daily broad spectrum sunscreen SPF 30+ to sun-exposed areas, reapply every 2 hours as needed.  - Call for new or changing lesions.  Neoplasm of uncertain behavior of skin Right Lower Pretibial  Skin / nail biopsy Type of biopsy: tangential   Informed consent: discussed and consent obtained   Patient was prepped and draped in usual sterile fashion: Area prepped with alcohol. Anesthesia: the lesion was anesthetized in a standard fashion   Anesthetic:  1% lidocaine w/ epinephrine 1-100,000 buffered w/ 8.4% NaHCO3 Instrument used: flexible razor blade   Hemostasis achieved with: pressure, aluminum chloride and electrodesiccation   Outcome: patient tolerated  procedure well   Post-procedure details: wound care instructions given   Post-procedure details comment:  Ointment and small bandage applied  Specimen 1 - Surgical pathology Differential Diagnosis: Inflamed SK vs Prurigo Nodule r/o SCC Check Margins: No 1.1cm crusted pink nodule   Inflamed seborrheic keratosis (3) Right Frontal Scalp Hairline x 1, L hip x 2 (residual)  Avoid picking while healing.  Destruction of lesion - Right Frontal Scalp Hairline x 1, L hip x 2 (residual)  Destruction method: cryotherapy   Informed consent: discussed and consent obtained   Lesion destroyed using liquid nitrogen: Yes   Region frozen until ice ball extended beyond lesion: Yes   Outcome: patient tolerated procedure well with no complications   Post-procedure details: wound care instructions given    Nummular dermatitis Left Forearm, L mid back  Flared Start clobetasol cream Apply qd/bid AA arm and back up to 2 weeks until rash clear. Patient has at home. Caution atrophy with long-term use.   Recommend mild soap and moisturizing cream 1-2 times daily.   Topical steroids (such as triamcinolone, fluocinolone, fluocinonide, mometasone, clobetasol, halobetasol, betamethasone, hydrocortisone) can cause thinning and lightening of the skin if they are used for too long in the same area. Your physician has selected the right strength medicine for your problem and area affected on the body. Please use your medication only as directed by your physician to prevent side effects.    Return in about 6 months (around 08/26/2020) for skin check.   IJamesetta Orleans, CMA, am acting as scribe for Brendolyn Patty, MD .  Documentation: I have reviewed the above documentation for accuracy and completeness, and I agree with the above.  Brendolyn Patty MD

## 2020-02-26 NOTE — Patient Instructions (Addendum)
Cryotherapy Aftercare  . Wash gently with soap and water everyday.   Marland Kitchen Apply Vaseline and Band-Aid daily until healed.   Start Clobetasol cream - Apply to rash on left forearm and back 1-2 times a day up to 2 weeks. Avoid face, groin, underarms.  Wound Care Instructions  1. Cleanse wound gently with soap and water once a day then pat dry with clean gauze. Apply a thing coat of Petrolatum (petroleum jelly, "Vaseline") over the wound (unless you have an allergy to this). We recommend that you use a new, sterile tube of Vaseline. Do not pick or remove scabs. Do not remove the yellow or white "healing tissue" from the base of the wound.  2. Cover the wound with fresh, clean, nonstick gauze and secure with paper tape. You may use Band-Aids in place of gauze and tape if the would is small enough, but would recommend trimming much of the tape off as there is often too much. Sometimes Band-Aids can irritate the skin.  3. You should call the office for your biopsy report after 1 week if you have not already been contacted.  4. If you experience any problems, such as abnormal amounts of bleeding, swelling, significant bruising, significant pain, or evidence of infection, please call the office immediately.

## 2020-03-12 ENCOUNTER — Telehealth: Payer: Self-pay

## 2020-03-12 NOTE — Telephone Encounter (Signed)
Advised patient biopsy was SCC on the right lower pretibial. Scheduled appt for [facility] 04/01/2020 at 3:00pm.

## 2020-03-12 NOTE — Telephone Encounter (Signed)
-----   Message from Willeen Niece, MD sent at 03/11/2020 10:53 AM EST ----- Skin , right lower pretibial WELL DIFFERENTIATED SQUAMOUS CELL CARCINOMA  SCC skin cancer- needs EDC

## 2020-04-01 ENCOUNTER — Other Ambulatory Visit: Payer: Self-pay

## 2020-04-01 ENCOUNTER — Encounter: Payer: Self-pay | Admitting: Dermatology

## 2020-04-01 ENCOUNTER — Ambulatory Visit: Payer: Medicare Other | Admitting: Dermatology

## 2020-04-01 DIAGNOSIS — L820 Inflamed seborrheic keratosis: Secondary | ICD-10-CM | POA: Diagnosis not present

## 2020-04-01 DIAGNOSIS — L738 Other specified follicular disorders: Secondary | ICD-10-CM | POA: Diagnosis not present

## 2020-04-01 DIAGNOSIS — Z85828 Personal history of other malignant neoplasm of skin: Secondary | ICD-10-CM | POA: Diagnosis not present

## 2020-04-01 DIAGNOSIS — L821 Other seborrheic keratosis: Secondary | ICD-10-CM | POA: Diagnosis not present

## 2020-04-01 NOTE — Patient Instructions (Addendum)
Cryotherapy Aftercare  . Wash gently with soap and water everyday.   . Apply Vaseline and Band-Aid daily until healed.  

## 2020-04-01 NOTE — Progress Notes (Signed)
   Follow-Up Visit   Subjective  Jamie Anderson is a 85 y.o. female who presents for the following: Procedure (Patient here today to treat bx proven SCC at right lower pretibial. ).  It has healed up well.  She also has an itchy spot on lower back.  Spots on L lateral thigh and hip have cleared and no longer itch.  She has a h/o granuloma annulare L thigh    The following portions of the chart were reviewed this encounter and updated as appropriate:       Review of Systems:  No other skin or systemic complaints except as noted in HPI or Assessment and Plan.  Objective  Well appearing patient in no apparent distress; mood and affect are within normal limits.  A focused examination was performed including lower legs, face, back. Relevant physical exam findings are noted in the Assessment and Plan.  Objective  Right Lower Pretibial: Healed biopsy site, pink smooth slightly firm macule c/w scar  Images    Objective  right chin: Small yellow papules with a central dell.   Objective  left spinal lower back x 1: Erythematous keratotic or waxy stuck-on papule,   clear at left lateral thigh, left hip   Assessment & Plan  History of SCC (squamous cell carcinoma) of skin Right Lower Pretibial  Appears clinically clear with biopsy only- no treatment necessary at this time.   Will monitor for recurrence.  Clear. Observe for recurrence. Call clinic for new or changing lesions.  Recommend regular skin exams, daily broad-spectrum spf 30+ sunscreen use, and photoprotection.      Sebaceous hyperplasia right chin  Benign, observe.    Inflamed seborrheic keratosis left spinal lower back x 1  S/p LN2 at left lateral thigh and left hip with resolution  Destruction of lesion - left spinal lower back x 1  Destruction method: cryotherapy   Informed consent: discussed and consent obtained   Lesion destroyed using liquid nitrogen: Yes   Region frozen until ice ball extended beyond  lesion: Yes   Outcome: patient tolerated procedure well with no complications   Post-procedure details: wound care instructions given    Seborrheic Keratoses - Stuck-on, waxy, tan-brown papules and plaques  - Discussed benign etiology and prognosis. - Observe - Call for any changes  Return in about 6 months (around 09/29/2020) for h/o SCC, recheck leg.  Graciella Belton, RMA, am acting as scribe for Brendolyn Patty, MD . Documentation: I have reviewed the above documentation for accuracy and completeness, and I agree with the above.  Brendolyn Patty MD

## 2020-05-30 IMAGING — MR MR CERVICAL SPINE W/O CM
5 series · 38 of 48 positions shown · non-contrast
Comparison: 03/20/2009

CLINICAL DATA: Chronic neck pain

EXAM:
MRI CERVICAL SPINE WITHOUT CONTRAST
TECHNIQUE: Multiplanar, multisequence MR imaging of the cervical spine was
performed. No intravenous contrast was administered.

[Series 5: T2 · sagittal · 3.0mm · 0.62mm/px · 6 of 15 slices shown (1 of 2)]
[im 1/15]
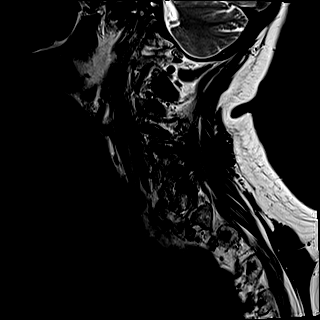
[im 3/15]
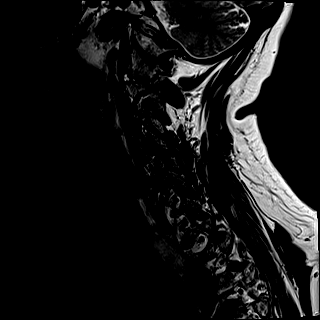
[im 6/15]
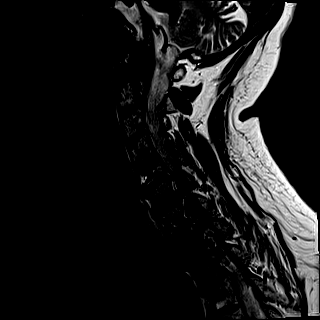
[im 9/15]
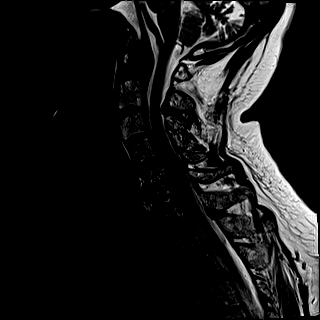
[im 12/15]
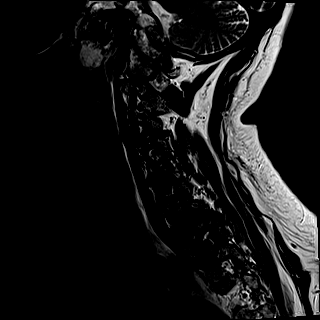
[im 15/15]
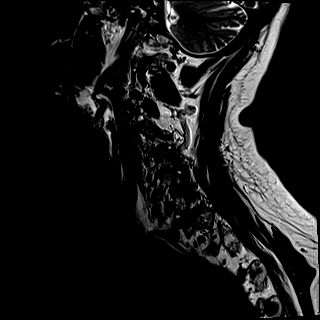

[Series 6: FLAIR · sagittal · 3.0mm · 0.78mm/px · 7 of 15 slices shown]
[im 1/15]
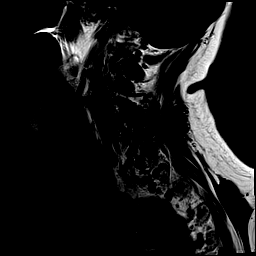
[im 3/15]
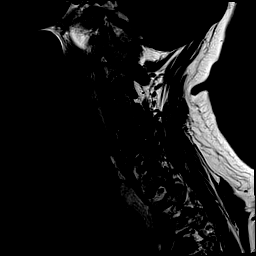
[im 5/15]
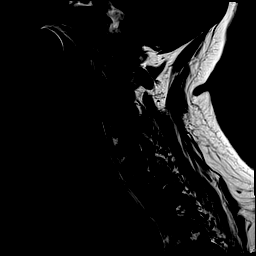
[im 8/15]
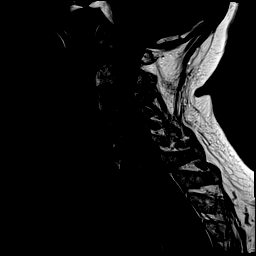
[im 10/15]
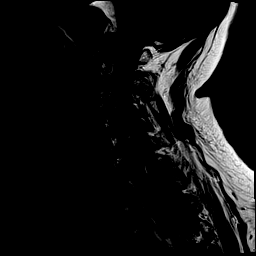
[im 12/15]
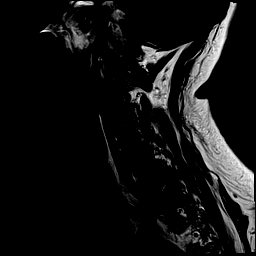
[im 15/15]
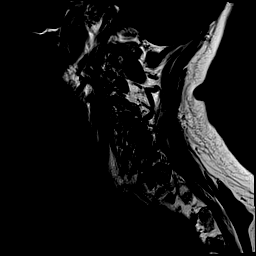

[Series 7: STIR · sagittal · 3.0mm · 0.62mm/px · 7 of 14 slices shown]
[im 1/14]
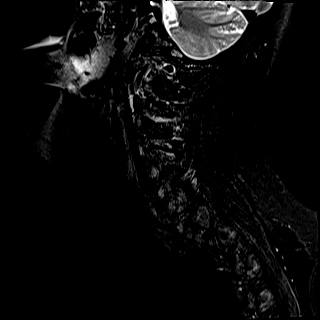
[im 3/14]
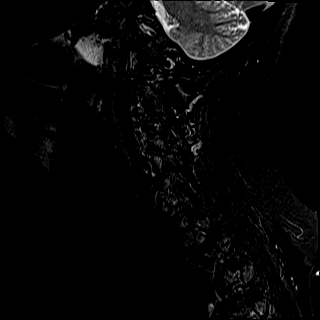
[im 5/14]
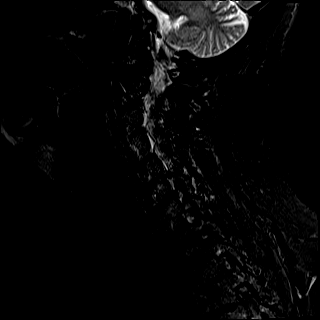
[im 7/14]
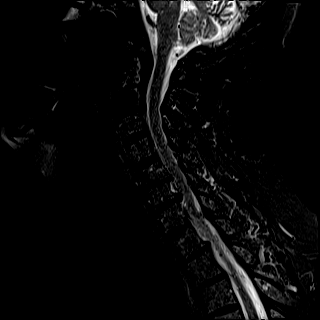
[im 9/14]
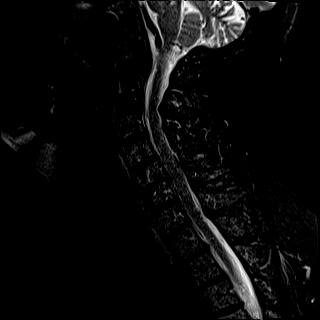
[im 11/14]
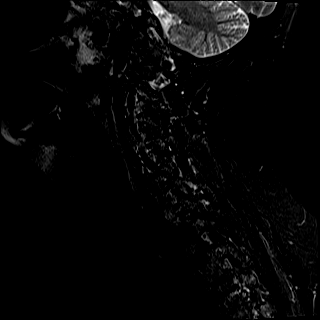
[im 14/14]
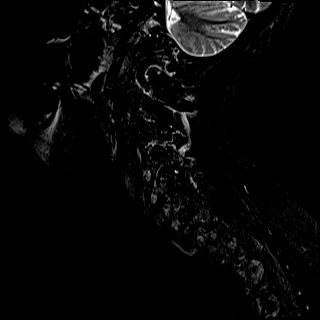

[Series 8: T2 · axial · 3.0mm · 0.70mm/px · z∈[-144,-52]mm · 10 of 29 slices shown (2 of 2)]
[im 1/29]
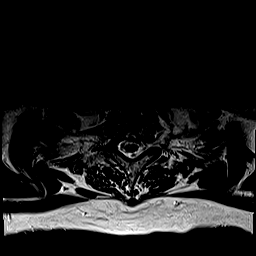
[im 3/29]
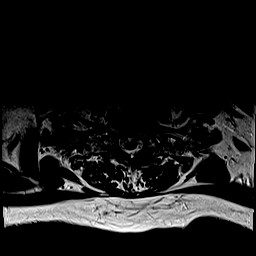
[im 5/29]
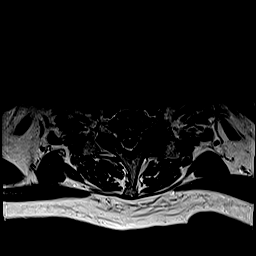
[im 7/29]
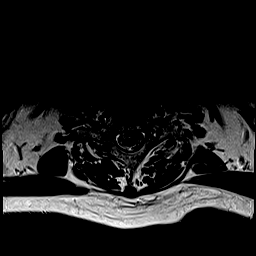
[im 9/29]
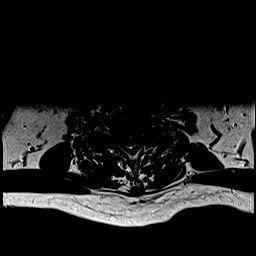
[im 13/29]
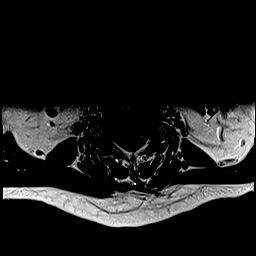
[im 16/29]
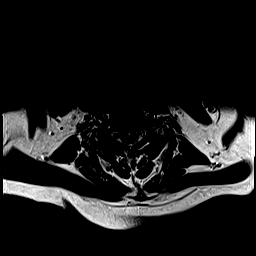
[im 20/29]
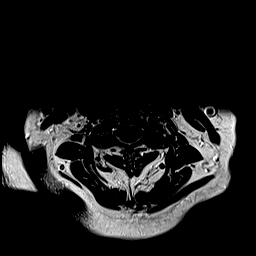
[im 24/29]
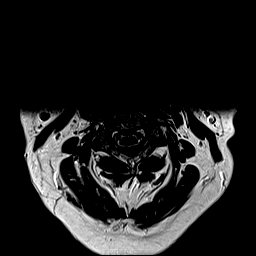
[im 29/29]
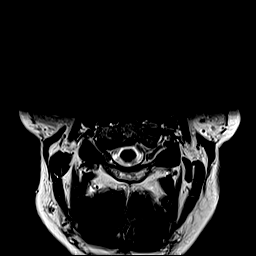

[Series 9: ax mpgr · axial · 3.0mm · 0.35mm/px · z∈[-144,-52]mm · 8 of 29 slices shown]
[im 1/29]
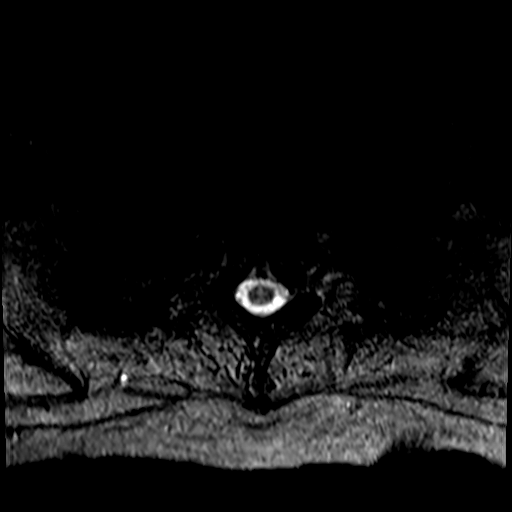
[im 5/29]
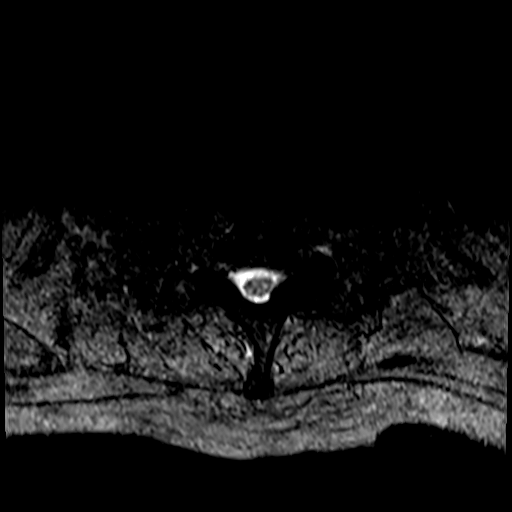
[im 9/29]
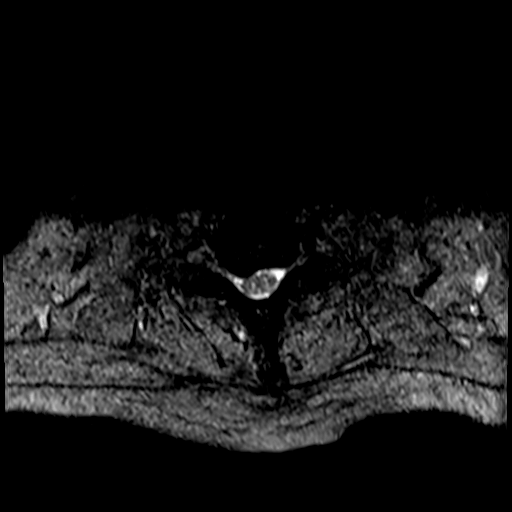
[im 13/29]
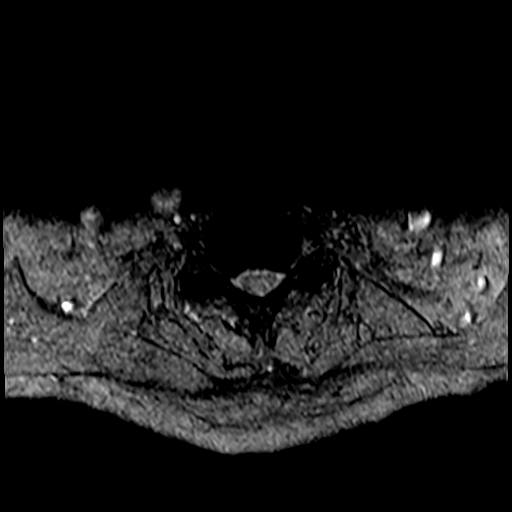
[im 16/29]
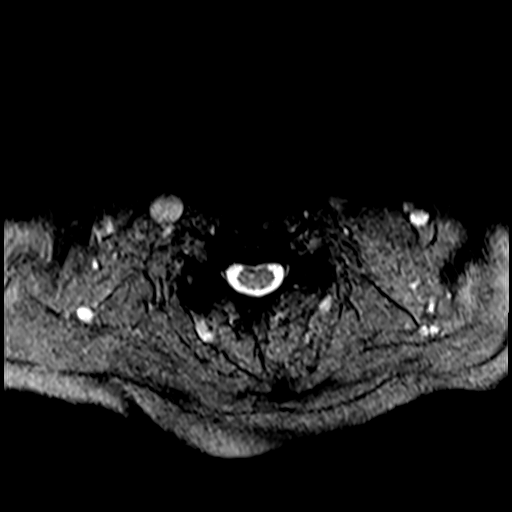
[im 20/29]
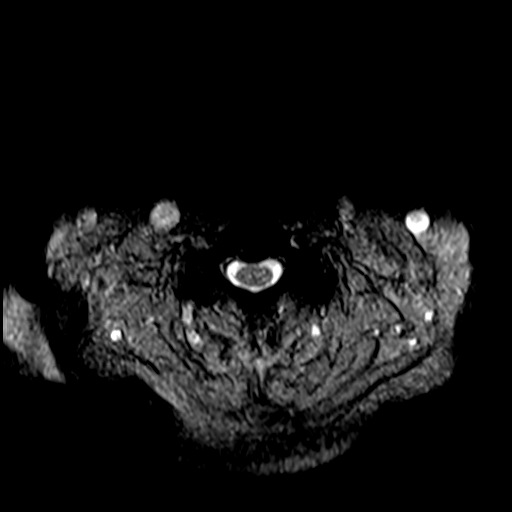
[im 24/29]
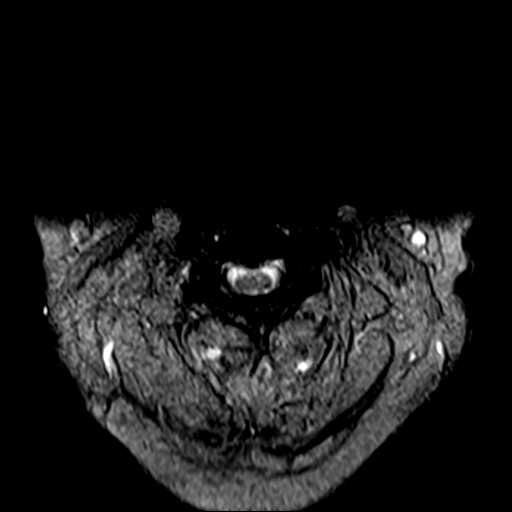
[im 29/29]
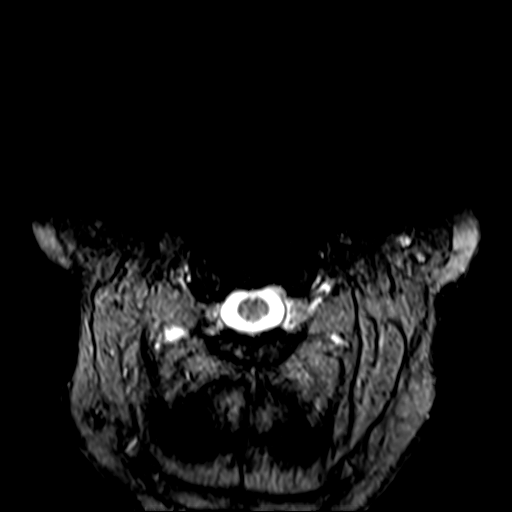

[38 of 48 positions shown; findings below may reference images not displayed]

FINDINGS: Alignment: Grade 1 anterolisthesis C3 on C4, C4 on C5, and C7 on T1.

Vertebrae: Diffuse marrow heterogeneity without discrete marrow
replacing lesion. Findings are nonspecific but can be seen in the
setting of chronic anemia, smoking, and/or obesity. No fracture or
evidence of discitis.

Cord: Normal signal and morphology.

Posterior Fossa, vertebral arteries, paraspinal tissues: Negative.

Disc levels:

C2-C3: Small posterior disc osteophyte complex and bilateral facet
arthrosis results in mild canal stenosis without foraminal stenosis.
Findings slightly progressed from prior.

C3-C4: Right paracentral posterior disc osteophyte complex with mild
bilateral facet and uncovertebral arthropathy result in moderate
canal stenosis without foraminal stenosis. Findings progressed from
prior.

C4-C5: Small posterior disc osteophyte complex with right worse than
left facet and uncovertebral arthropathy result in mild-to-moderate
right foraminal stenosis and mild canal stenosis. Findings
progressed from prior.

C5-C6: Posterior disc osteophyte complex and bilateral facet and
uncovertebral arthropathy result in moderate to severe bilateral
foraminal stenosis and moderate canal stenosis. Findings progressed
from prior

C6-C7: Posterior disc osteophyte complex results in mass effect upon
the ventral cord. Mild bilateral facet and uncovertebral arthropathy
result in mild-to-moderate bilateral foraminal stenosis and moderate
canal stenosis.

C7-T1: Small posterior disc osteophyte complex with left worse than
right facet and uncovertebral arthropathy result in moderate left
foraminal stenosis without canal stenosis.
IMPRESSION: Progressive multilevel cervical spondylosis. Findings are most
pronounced at the C5-6 level where there is moderate-to-severe
bilateral foraminal stenosis and moderate canal stenosis. No
cervical cord signal abnormality.

## 2020-06-13 ENCOUNTER — Other Ambulatory Visit: Payer: Self-pay

## 2020-06-13 DIAGNOSIS — E785 Hyperlipidemia, unspecified: Secondary | ICD-10-CM

## 2020-06-13 MED ORDER — PANTOPRAZOLE SODIUM 40 MG PO TBEC: 1.0000 | DELAYED_RELEASE_TABLET | Freq: Every day | ORAL | 1 refills | Status: DC

## 2020-06-13 MED ORDER — FLUTICASONE PROPIONATE 50 MCG/ACT NA SUSP: 2.0000 | Freq: Every day | NASAL | 1 refills | Status: DC

## 2020-06-13 MED ORDER — EZETIMIBE 10 MG PO TABS: 10.0000 mg | ORAL_TABLET | Freq: Every day | ORAL | 1 refills | Status: DC

## 2020-06-13 MED ORDER — ALLOPURINOL 100 MG PO TABS: 100.0000 mg | ORAL_TABLET | Freq: Every day | ORAL | 1 refills | Status: DC

## 2020-06-13 MED ORDER — LOSARTAN POTASSIUM 100 MG PO TABS: 1.0000 | ORAL_TABLET | Freq: Every day | ORAL | 1 refills | Status: DC

## 2020-06-13 MED ORDER — SIMVASTATIN 40 MG PO TABS: 40.0000 mg | ORAL_TABLET | Freq: Every day | ORAL | 1 refills | Status: DC

## 2020-06-16 DIAGNOSIS — M170 Bilateral primary osteoarthritis of knee: Secondary | ICD-10-CM | POA: Diagnosis not present

## 2020-07-15 ENCOUNTER — Ambulatory Visit: Payer: Medicare Other | Admitting: Dermatology

## 2020-07-15 ENCOUNTER — Encounter: Payer: Self-pay | Admitting: Dermatology

## 2020-07-15 ENCOUNTER — Other Ambulatory Visit: Payer: Self-pay

## 2020-07-15 DIAGNOSIS — Z85828 Personal history of other malignant neoplasm of skin: Secondary | ICD-10-CM

## 2020-07-15 DIAGNOSIS — D485 Neoplasm of uncertain behavior of skin: Secondary | ICD-10-CM

## 2020-07-15 DIAGNOSIS — C44722 Squamous cell carcinoma of skin of right lower limb, including hip: Secondary | ICD-10-CM

## 2020-07-15 DIAGNOSIS — L570 Actinic keratosis: Secondary | ICD-10-CM

## 2020-07-15 DIAGNOSIS — L578 Other skin changes due to chronic exposure to nonionizing radiation: Secondary | ICD-10-CM | POA: Diagnosis not present

## 2020-07-15 MED ORDER — MUPIROCIN 2 % EX OINT: TOPICAL_OINTMENT | CUTANEOUS | 1 refills | Status: DC

## 2020-07-15 NOTE — Patient Instructions (Addendum)

## 2020-07-15 NOTE — Progress Notes (Signed)
Follow-Up Visit   Subjective  Jamie Anderson is a 85 y.o. female who presents for the following: Growth (Patient here to have a spot on her right lower leg checked. Present for several months, but has grown and gotten painful. She has a history of SCC of the right lower pretibial.).   The following portions of the chart were reviewed this encounter and updated as appropriate:       Review of Systems:  No other skin or systemic complaints except as noted in HPI or Assessment and Plan.  Objective  Well appearing patient in no apparent distress; mood and affect are within normal limits.  A focused examination was performed including right lower leg. Relevant physical exam findings are noted in the Assessment and Plan.  Objective  Right Upper Pretibia x 1, R lateral lower leg x 1, L wrist x 2 (4): Keratotic papules  Objective  Right mid pretibia: 10.55mm pink scaly papule with central plug     Objective  Right Lower Pretibia: Clear today   Assessment & Plan  Hypertrophic actinic keratosis (4) Right Upper Pretibia x 1, R lateral lower leg x 1, L wrist x 2  Prior to procedure, discussed risks of blister formation, small wound, skin dyspigmentation, or rare scar following cryotherapy.   Recheck right upper pretibia on f/u.  Destruction of lesion - Right Upper Pretibia x 1, R lateral lower leg x 1, L wrist x 2  Destruction method: cryotherapy   Informed consent: discussed and consent obtained   Lesion destroyed using liquid nitrogen: Yes   Region frozen until ice ball extended beyond lesion: Yes   Outcome: patient tolerated procedure well with no complications   Post-procedure details: wound care instructions given    Neoplasm of uncertain behavior of skin Right mid pretibia  Skin / nail biopsy Type of biopsy: tangential   Informed consent: discussed and consent obtained   Patient was prepped and draped in usual sterile fashion: Area prepped with  alcohol. Anesthesia: the lesion was anesthetized in a standard fashion   Anesthetic:  1% lidocaine w/ epinephrine 1-100,000 buffered w/ 8.4% NaHCO3 Instrument used: flexible razor blade   Hemostasis achieved with: pressure, aluminum chloride and electrodesiccation   Outcome: patient tolerated procedure well    Destruction of lesion  Destruction method: electrodesiccation and curettage   Informed consent: discussed and consent obtained   Timeout:  patient name, date of birth, surgical site, and procedure verified Curettage performed in three different directions: Yes   Electrodesiccation performed over the curetted area: Yes   Lesion length (cm):  1 Lesion width (cm):  1 Margin per side (cm):  0.1 Final wound size (cm):  1.2 Hemostasis achieved with:  pressure, aluminum chloride and electrodesiccation Outcome: patient tolerated procedure well with no complications   Post-procedure details: wound care instructions given   Additional details:  Mupirocin ointment and Bandaid applied    mupirocin ointment (BACTROBAN) 2 %  Specimen 1 - Surgical pathology Differential Diagnosis: r/o SCC Check Margins: No 10.38mm pink scaly papule with central plug EDC today  Start mupirocin 2% ointment apply QD with bandage changes.  Recommend compression sock daily while healing.  History of SCC (squamous cell carcinoma) of skin Right Lower Pretibia  Clear. Observe for recurrence. Call clinic for new or changing lesions.  Recommend regular skin exams, daily broad-spectrum spf 30+ sunscreen use, and photoprotection.    Actinic Damage - chronic, secondary to cumulative UV radiation exposure/sun exposure over time - diffuse scaly erythematous  macules with underlying dyspigmentation - Recommend daily broad spectrum sunscreen SPF 30+ to sun-exposed areas, reapply every 2 hours as needed.  - Recommend staying in the shade or wearing long sleeves, sun glasses (UVA+UVB protection) and wide brim hats  (4-inch brim around the entire circumference of the hat). - Call for new or changing lesions.  Return as scheduled.   IJamesetta Orleans, CMA, am acting as scribe for Brendolyn Patty, MD .  Documentation: I have reviewed the above documentation for accuracy and completeness, and I agree with the above.  Brendolyn Patty MD

## 2020-07-18 ENCOUNTER — Ambulatory Visit: Payer: Medicare Other | Admitting: Internal Medicine

## 2020-07-21 ENCOUNTER — Telehealth: Payer: Self-pay

## 2020-07-21 NOTE — Telephone Encounter (Signed)
Advised pt of bx result and advised was treated at time of bx.  Advised to keep f/u appointment to recheck area.Mariana Kaufman

## 2020-07-21 NOTE — Telephone Encounter (Signed)
-----   Message from Brendolyn Patty, MD sent at 07/21/2020  8:21 AM EDT ----- Skin , right mid pretibia WELL DIFFERENTIATED SQUAMOUS CELL CARCINOMA  SCC skin cancer- already treated with EDC at time of biopsy

## 2020-07-29 ENCOUNTER — Encounter: Payer: Self-pay | Admitting: Internal Medicine

## 2020-07-29 ENCOUNTER — Other Ambulatory Visit: Payer: Self-pay

## 2020-07-29 ENCOUNTER — Ambulatory Visit (INDEPENDENT_AMBULATORY_CARE_PROVIDER_SITE_OTHER): Payer: Medicare Other | Admitting: Internal Medicine

## 2020-07-29 VITALS — BP 132/68 | HR 74 | Temp 97.2°F | Resp 16 | Ht 65.0 in | Wt 186.0 lb

## 2020-07-29 DIAGNOSIS — M48061 Spinal stenosis, lumbar region without neurogenic claudication: Secondary | ICD-10-CM | POA: Diagnosis not present

## 2020-07-29 DIAGNOSIS — M1000 Idiopathic gout, unspecified site: Secondary | ICD-10-CM | POA: Diagnosis not present

## 2020-07-29 DIAGNOSIS — I700 Atherosclerosis of aorta: Secondary | ICD-10-CM | POA: Diagnosis not present

## 2020-07-29 DIAGNOSIS — I10 Essential (primary) hypertension: Secondary | ICD-10-CM

## 2020-07-29 DIAGNOSIS — E785 Hyperlipidemia, unspecified: Secondary | ICD-10-CM | POA: Diagnosis not present

## 2020-07-29 DIAGNOSIS — L659 Nonscarring hair loss, unspecified: Secondary | ICD-10-CM

## 2020-07-29 DIAGNOSIS — N1831 Chronic kidney disease, stage 3a: Secondary | ICD-10-CM | POA: Diagnosis not present

## 2020-07-29 DIAGNOSIS — F320 Major depressive disorder, single episode, mild: Secondary | ICD-10-CM

## 2020-07-29 MED ORDER — CEPHALEXIN 500 MG PO CAPS: 500.0000 mg | ORAL_CAPSULE | Freq: Four times a day (QID) | ORAL | 0 refills | Status: DC

## 2020-07-29 MED ORDER — ZOSTER VAC RECOMB ADJUVANTED 50 MCG/0.5ML IM SUSR: 0.5000 mL | Freq: Once | INTRAMUSCULAR | 1 refills | Status: AC

## 2020-07-29 NOTE — Progress Notes (Signed)
Subjective:  Patient ID: Jamie Anderson, female    DOB: October 06, 1935  Age: 85 y.o. MRN: [id number]  CC: The primary encounter diagnosis was Stage 3a chronic kidney disease ([place]). Diagnoses of Hyperlipidemia LDL goal <130, Idiopathic gout, unspecified chronicity, unspecified site, Hair loss, Aortic atherosclerosis ([place]), Major depressive disorder, single episode, mild ([place]), Degenerative lumbar spinal stenosis, Primary hypertension, and Hyperlipidemia LDL goal <70 were also pertinent to this visit.  HPI Jamie Anderson presents for follow up on multiple conditions.  This visit occurred during the SARS-CoV-2 public health emergency.  Safety protocols were in place, including screening questions prior to the visit, additional usage of staff PPE, and extensive cleaning of exam room while observing appropriate contact time as indicated for disinfecting solutions.     1) CKD stage 3 with  hypertension  :  She feels generally  Well except for infrequent episodes of gout She is avoiding NSAIDs .  Home BP readings are generally < 140/80 on combination of losartan,  hctz and metoprolol.   2) aortic atherosclerosis noted on prior imaging study: reviewed need to continue statin therapy.  tolerating simvastatin and zetia History  Of gout ,  No recent flares on allopurinol   3) Obesity:  Has lost 6 lbs since last visit .  Following a low GI diet.   4)  MDD:  Patient appears in good spirits today,  Denies ongoing depressive symptoms.  Taking sertraline for concurrent anxiety.   Outpatient Medications Prior to Visit  Medication Sig Dispense Refill  . albuterol (VENTOLIN HFA) 108 (90 Base) MCG/ACT inhaler Inhale 1 puff into the lungs every 6 (six) hours as needed for wheezing or shortness of breath. 1 each 10  . allopurinol (ZYLOPRIM) 100 MG tablet Take 1 tablet (100 mg total) by mouth daily. 90 tablet 1  . ANORO ELLIPTA 62.5-25 MCG/INH AEPB Inhale 1 puff into the lungs daily. (Patient taking differently:  Inhale 1 puff into the lungs daily as needed.) 1 each 6  . clobetasol cream (TEMOVATE) 0.05 %     . Coenzyme Q10 (CO Q 10 PO) Take by mouth daily.     Marland Kitchen ezetimibe (ZETIA) 10 MG tablet Take 1 tablet (10 mg total) by mouth daily. 90 tablet 1  . fluticasone (FLONASE) 50 MCG/ACT nasal spray Place 2 sprays into both nostrils daily. 48 g 1  . metoprolol succinate (TOPROL-XL) 25 MG 24 hr tablet TAKE 1 TABLET EVERY DAY 90 tablet 1  . mupirocin ointment (BACTROBAN) 2 % Apply once a day to affected area leg with bandage changes. 22 g 1  . pantoprazole (PROTONIX) 40 MG tablet Take 1 tablet (40 mg total) by mouth daily. 90 tablet 1  . PREMARIN vaginal cream Place 1 Applicatorful vaginally 2 (two) times a week. 30 g 0  . sertraline (ZOLOFT) 100 MG tablet TAKE 1 TABLET EVERY DAY 90 tablet 1  . simvastatin (ZOCOR) 40 MG tablet Take 1 tablet (40 mg total) by mouth at bedtime. 90 tablet 1  . traMADol (ULTRAM) 50 MG tablet Take 1 tablet (50 mg total) by mouth every 12 (twelve) hours as needed for moderate pain. 60 tablet 5  . hydrochlorothiazide (MICROZIDE) 12.5 MG capsule TAKE 1 CAPSULE EVERY DAY 90 capsule 1  . losartan (COZAAR) 100 MG tablet Take 1 tablet (100 mg total) by mouth daily. 90 tablet 1  . aspirin EC 81 MG tablet Take 1 tablet (81 mg total) by mouth daily. (Patient not taking: Reported on 07/29/2020) 90 tablet 3  .  predniSONE (DELTASONE) 10 MG tablet 6 tablets on Day 1 , then reduce by 1 tablet daily until gone (Patient not taking: Reported on 07/29/2020) 21 tablet 0   No facility-administered medications prior to visit.    Review of Systems;  Patient denies headache, fevers, malaise, unintentional weight loss, skin rash, eye pain, sinus congestion and sinus pain, sore throat, dysphagia,  hemoptysis , cough, dyspnea, wheezing, chest pain, palpitations, orthopnea, edema, abdominal pain, nausea, melena, diarrhea, constipation, flank pain, dysuria, hematuria, urinary  Frequency, nocturia, numbness,  tingling, seizures,  Focal weakness, Loss of consciousness,  Tremor, insomnia, depression, anxiety, and suicidal ideation.      Objective:  BP 132/68 (BP Location: Left Arm, Patient Position: Sitting, Cuff Size: Large)   Pulse 74   Temp (!) 97.2 F (36.2 C) (Temporal)   Resp 16   Ht 5\' 5"  (1.651 m)   Wt 186 lb (84.4 kg)   SpO2 98%   BMI 30.95 kg/m   BP Readings from Last 3 Encounters:  07/29/20 132/68  12/04/19 136/66  11/26/19 120/60    Wt Readings from Last 3 Encounters:  07/29/20 186 lb (84.4 kg)  12/07/19 193 lb (87.5 kg)  12/04/19 196 lb (88.9 kg)    General appearance: alert, cooperative and appears stated age Ears: normal TM's and external ear canals both ears Throat: lips, mucosa, and tongue normal; teeth and gums normal Neck: no adenopathy, no carotid bruit, supple, symmetrical, trachea midline and thyroid not enlarged, symmetric, no tenderness/mass/nodules Back: symmetric, no curvature. ROM normal. No CVA tenderness. Lungs: clear to auscultation bilaterally Heart: regular rate and rhythm, S1, S2 normal, no murmur, click, rub or gallop Abdomen: soft, non-tender; bowel sounds normal; no masses,  no organomegaly Pulses: 2+ and symmetric Skin: Skin color, texture, turgor normal. No rashes or lesions Lymph nodes: Cervical, supraclavicular, and axillary nodes normal.  Lab Results  Component Value Date   HGBA1C 5.5 01/30/2018   HGBA1C 5.4 01/15/2015    Lab Results  Component Value Date   CREATININE 1.08 07/29/2020   CREATININE 0.95 09/05/2019   CREATININE 1.02 08/21/2018    Lab Results  Component Value Date   WBC 5.5 09/05/2019   HGB 12.4 09/05/2019   HCT 36.4 09/05/2019   PLT 181.0 09/05/2019   GLUCOSE 94 07/29/2020   CHOL 166 07/29/2020   TRIG 122.0 07/29/2020   HDL 72.80 07/29/2020   LDLDIRECT 59.0 01/30/2018   LDLCALC 69 07/29/2020   ALT 17 07/29/2020   AST 17 07/29/2020   NA 141 07/29/2020   K 4.1 07/29/2020   CL 105 07/29/2020    CREATININE 1.08 07/29/2020   BUN 18 07/29/2020   CO2 28 07/29/2020   TSH 1.55 07/29/2020   INR 0.9 05/29/2012   HGBA1C 5.5 01/30/2018   MICROALBUR <0.7 04/20/2016    US Abdomen Limited RUQ  Result Date: 12/24/2019 CLINICAL DATA:  Right upper quadrant pain EXAM: ULTRASOUND ABDOMEN LIMITED RIGHT UPPER QUADRANT COMPARISON:  08/31/2013 FINDINGS: Gallbladder: No gallstones or wall thickening visualized. No sonographic Murphy sign noted by sonographer. Common bile duct: Diameter: 3 mm Liver: Liver is slightly echogenic. No focal hepatic abnormality. Portal vein is patent on color Doppler imaging with normal direction of blood flow towards the liver. Other: None. IMPRESSION: 1. Slightly echogenic liver as may be seen with steatosis. 2. Negative for gallstones Electronically Signed   By: Donavan Foil M.D.   On: 12/24/2019 19:09    Assessment & Plan:   Problem List Items Addressed This Visit  Unprioritized   Aortic atherosclerosis ([place])    Noted on CT angiogram done in 2018 by ED for evaluation of atypical chest pain .LDL is now < 70 on simvastatin and zetia.    Lab Results  Component Value Date   CHOL 166 07/29/2020   HDL 72.80 07/29/2020   LDLCALC 69 07/29/2020   LDLDIRECT 59.0 01/30/2018   TRIG 122.0 07/29/2020   CHOLHDL 2 07/29/2020        Relevant Medications   telmisartan (MICARDIS) 80 MG tablet   CKD (chronic kidney disease) stage 3, GFR 30-59 ml/min (HCC) - Primary    GFR improved with avoidance of NSAIDs but still < 60 ml/min.  Will suspend hctz and continue metoprolol and CHANGE ARB TO TELMISARTAN 80 MG       Relevant Orders   Comprehensive metabolic panel (Completed)   Degenerative lumbar spinal stenosis    Managed with ESI and tylenol.  Using occasional prednisone/tramadol  Courses  For exacerbations.  Marland Kitchen  NSIADS are C/I due to CKD       Gout    Infrequently occurring with uric acid level < 6.0 using allopurinol 100 mg daily.  Flares are treated with prednisone  due to CKD       Relevant Orders   Uric acid (Completed)   Hyperlipidemia LDL goal <70    Managed with zetia and simvastatin  ,  Liver enzymes are overdue.  No changes today Lab Results  Component Value Date   CHOL 166 07/29/2020   HDL 72.80 07/29/2020   LDLCALC 69 07/29/2020   LDLDIRECT 59.0 01/30/2018   TRIG 122.0 07/29/2020   CHOLHDL 2 07/29/2020   Lab Results  Component Value Date   ALT 17 07/29/2020   AST 17 07/29/2020   ALKPHOS 103 07/29/2020   BILITOT 0.4 07/29/2020         Relevant Medications   telmisartan (MICARDIS) 80 MG tablet   Other Relevant Orders   Lipid panel (Completed)   Hypertension    STOPPING HCTZ.  CHANGING LOSARTAN TO TELMISARTAN.  CONTINUE METOPROLOL      Relevant Medications   telmisartan (MICARDIS) 80 MG tablet   Major depressive disorder, single episode, mild (HCC)    COMPLICATED BY ANXIETY and obsessive features.  Controlled with sertraline.  No changes today       Other Visit Diagnoses    Hair loss       Relevant Orders   TSH (Completed)      I have discontinued Izora Gala L. Stoke's predniSONE, hydrochlorothiazide, and losartan. I am also having her start on cephALEXin, Zoster Vaccine Adjuvanted, and telmisartan. Additionally, I am having her maintain her Coenzyme Q10 (CO Q 10 PO), Anoro Ellipta, Premarin, aspirin EC, traMADol, albuterol, clobetasol cream, metoprolol succinate, sertraline, simvastatin, allopurinol, ezetimibe, pantoprazole, fluticasone, and mupirocin ointment.  Meds ordered this encounter  Medications  . cephALEXin (KEFLEX) 500 MG capsule    Sig: Take 1 capsule (500 mg total) by mouth 4 (four) times daily.    Dispense:  28 capsule    Refill:  0  . Zoster Vaccine Adjuvanted [facility]) injection    Sig: Inject 0.5 mLs into the muscle once for 1 dose.    Dispense:  0.5 mL    Refill:  1  . telmisartan (MICARDIS) 80 MG tablet    Sig: Take 1 tablet (80 mg total) by mouth daily.    Dispense:  90 tablet    Refill:  1     REPLACING LOSARTAN AND HCTZ STOPPED  I provided  30 minutes of  face-to-face time during this encounter reviewing patient's CKD, hypertension, hyperlipidemia,  And gout , reviewing labs and imaging studies, providing counseling on the above mentioned problems , and coordination  of care .  Medications Discontinued During This Encounter  Medication Reason  . predniSONE (DELTASONE) 10 MG tablet Completed Course  . hydrochlorothiazide (MICROZIDE) 12.5 MG capsule   . losartan (COZAAR) 100 MG tablet     Follow-up: Return in about 6 months (around 01/29/2021).   Crecencio Mc, MD

## 2020-07-29 NOTE — Patient Instructions (Addendum)
Congratulations on your weight loss !  Keep up the good work   1)Keep your wounds covered to prevent infection.    You need to take cephalexin because the wound on your hand looks infected    2) Your aortic atherosclerosis places you at increased risk for heart attack and stroke  That's why you should continue to take Zetia  and simvastatin   3)  I want you to to start walking program daily .  Work your way up [employer] 15-20 minutes daily   Practice getting up from a sturdy heavy chair every day to [place] your thigh muscles

## 2020-07-30 LAB — COMPREHENSIVE METABOLIC PANEL
ALT: 17 U/L (ref 0–35)
AST: 17 U/L (ref 0–37)
Albumin: 4.2 g/dL (ref 3.5–5.2)
Alkaline Phosphatase: 103 U/L (ref 39–117)
BUN: 18 mg/dL (ref 6–23)
CO2: 28 mEq/L (ref 19–32)
Calcium: 9.7 mg/dL (ref 8.4–10.5)
Chloride: 105 mEq/L (ref 96–112)
Creatinine, Ser: 1.08 mg/dL (ref 0.40–1.20)
GFR: 47.75 mL/min — ABNORMAL LOW (ref >60.00)
Glucose, Bld: 94 mg/dL (ref 70–99)
Potassium: 4.1 mEq/L (ref 3.5–5.1)
Sodium: 141 mEq/L (ref 135–145)
Total Bilirubin: 0.4 mg/dL (ref 0.2–1.2)
Total Protein: 6.2 g/dL (ref 6.0–8.3)

## 2020-07-30 LAB — LIPID PANEL
Cholesterol: 166 mg/dL (ref 0–200)
HDL: 72.8 mg/dL (ref >39.00)
LDL Cholesterol: 69 mg/dL (ref 0–99)
NonHDL: 93.38
Total CHOL/HDL Ratio: 2
Triglycerides: 122 mg/dL (ref 0.0–149.0)
VLDL: 24.4 mg/dL (ref 0.0–40.0)

## 2020-07-30 LAB — TSH: TSH: 1.55 u[IU]/mL (ref 0.35–4.50)

## 2020-07-30 LAB — URIC ACID: Uric Acid, Serum: 5.5 mg/dL (ref 2.4–7.0)

## 2020-07-31 MED ORDER — TELMISARTAN 80 MG PO TABS: 80.0000 mg | ORAL_TABLET | Freq: Every day | ORAL | 1 refills | Status: DC

## 2020-07-31 NOTE — Assessment & Plan Note (Signed)
Managed with ESI and tylenol.  Using occasional prednisone/tramadol  Courses  For exacerbations.  Marland Kitchen  NSIADS are C/I due to CKD

## 2020-07-31 NOTE — Assessment & Plan Note (Signed)
STOPPING HCTZ.  CHANGING LOSARTAN TO TELMISARTAN.  CONTINUE METOPROLOL

## 2020-07-31 NOTE — Assessment & Plan Note (Signed)
COMPLICATED BY ANXIETY and obsessive features.  Controlled with sertraline.  No changes today

## 2020-07-31 NOTE — Assessment & Plan Note (Signed)
Infrequently occurring with uric acid level < 6.0 using allopurinol 100 mg daily.  Flares are treated with prednisone due to CKD

## 2020-07-31 NOTE — Assessment & Plan Note (Addendum)
GFR improved with avoidance of NSAIDs but still < 60 ml/min.  Will suspend hctz and continue metoprolol and CHANGE ARB TO TELMISARTAN 80 MG

## 2020-07-31 NOTE — Assessment & Plan Note (Signed)
Noted on CT angiogram done in 2018 by ED for evaluation of atypical chest pain .LDL is now < 70 on simvastatin and zetia.    Lab Results  Component Value Date   CHOL 166 07/29/2020   HDL 72.80 07/29/2020   LDLCALC 69 07/29/2020   LDLDIRECT 59.0 01/30/2018   TRIG 122.0 07/29/2020   CHOLHDL 2 07/29/2020

## 2020-07-31 NOTE — Assessment & Plan Note (Signed)
Managed with zetia and simvastatin  ,  Liver enzymes are overdue.  No changes today Lab Results  Component Value Date   CHOL 166 07/29/2020   HDL 72.80 07/29/2020   LDLCALC 69 07/29/2020   LDLDIRECT 59.0 01/30/2018   TRIG 122.0 07/29/2020   CHOLHDL 2 07/29/2020   Lab Results  Component Value Date   ALT 17 07/29/2020   AST 17 07/29/2020   ALKPHOS 103 07/29/2020   BILITOT 0.4 07/29/2020

## 2020-08-13 ENCOUNTER — Telehealth: Payer: Self-pay | Admitting: Internal Medicine

## 2020-08-13 NOTE — Telephone Encounter (Signed)
Transferred to Acces nurse   Pt has been having sever virginal irration for the last several days and is now raw    Access aware no appts available

## 2020-08-13 NOTE — Telephone Encounter (Signed)
Access nurse Documentation

## 2020-08-13 NOTE — Telephone Encounter (Signed)
Noted...awaiting access nurse note

## 2020-08-14 ENCOUNTER — Other Ambulatory Visit: Payer: Self-pay

## 2020-08-14 ENCOUNTER — Telehealth (INDEPENDENT_AMBULATORY_CARE_PROVIDER_SITE_OTHER): Payer: Medicare Other | Admitting: Internal Medicine

## 2020-08-14 ENCOUNTER — Other Ambulatory Visit (INDEPENDENT_AMBULATORY_CARE_PROVIDER_SITE_OTHER): Payer: Medicare Other

## 2020-08-14 ENCOUNTER — Encounter: Payer: Self-pay | Admitting: Internal Medicine

## 2020-08-14 VITALS — BP 162/74 | HR 86 | Ht 65.0 in | Wt 186.0 lb

## 2020-08-14 DIAGNOSIS — R0602 Shortness of breath: Secondary | ICD-10-CM | POA: Diagnosis not present

## 2020-08-14 DIAGNOSIS — J069 Acute upper respiratory infection, unspecified: Secondary | ICD-10-CM | POA: Diagnosis not present

## 2020-08-14 LAB — POCT RAPID STREP A (OFFICE): Rapid Strep A Screen: NEGATIVE

## 2020-08-14 LAB — POC COVID19 BINAXNOW: SARS Coronavirus 2 Ag: NEGATIVE

## 2020-08-14 MED ORDER — MOLNUPIRAVIR EUA 200MG CAPSULE
4.0000 | ORAL_CAPSULE | Freq: Two times a day (BID) | ORAL | 0 refills | Status: DC
  Filled 2020-08-14: qty 40, 5d supply, fill #0

## 2020-08-14 MED ORDER — PREDNISONE 10 MG PO TABS: ORAL_TABLET | ORAL | 0 refills | Status: DC

## 2020-08-14 NOTE — Telephone Encounter (Signed)
Called to speak with pt about the yeast infection and pt stated that she needs an appt. Pt stated that she woke up last night with SOBr, wheezing and chest tightness. Pt does not have a way to check her oxygen level but will try to get her grandson or neighbor to bring one over and put on her porch. Pt has not been tested for covid. She stated that she went to a gathering indoors and no one wore masks yesterday. Pt's symptoms started last night. Pt has been scheduled for a virtual at 10 am this morning with Dr. Derrel Nip.

## 2020-08-14 NOTE — Patient Instructions (Signed)
I am prescribing a prednisone taper for you to start TODAY.  ([facility])  This will help prevent a full blown asthma attack  Keep using your other medications (amoro, albuterol)  Suppress the cough with your OTC cough syrup   I have sent the COVID antiviral medication to [place] to pick up.  DO NOT start it unless your COVID test comes back POSITIVE

## 2020-08-14 NOTE — Progress Notes (Signed)
Virtual Visit via [place] Note  This visit type was conducted due to national recommendations for restrictions regarding the COVID-19 pandemic (e.g. social distancing).  This format is felt to be most appropriate for this patient at this time.  All issues noted in this document were discussed and addressed.  No physical exam was performed (except for noted visual exam findings with Video Visits).   I connected with Jamie Anderson  on 08/17/20 at 10:00 AM EDT by a video enabled telemedicine application and verified that I am speaking with the correct person using two identifiers. Location patient: home Location provider: work or home office Persons participating in the virtual visit: patient, provider  I discussed the limitations, risks, security and privacy concerns of performing an evaluation and management service by telephone and the availability of in person appointments. I also discussed with the patient that there may be a patient responsible charge related to this service. The patient expressed understanding and agreed to proceed.  Reason for visit:  covid infection suspected   HPI:  85 yr old female  with Reactive Airway Disease by recent pulmonology evaluation with NORMAL PFTS,  up to date on [place] 24 HOURS AGO  AFTER ATTENDING  A FUNERAL 72 HOURS PRIOR TO THE ONSET OF SYMPTOMS.   02 SATS 95% TODAY, BUT SHE HAS BEEN HAVING COUGH, WHEEZING , PND WITH CONGESTION AND SORE THROAT.  NO FEVERS, ANOSMIA OR LOSS OF TASTE.     ROS: See pertinent positives and negatives per HPI.  Past Medical History:  Diagnosis Date   Basal cell carcinoma 01/17/2018   Left chest. Infiltrative. Excised 02/06/2018, margins free.   Cystic teratoma of right ovary 11/23/2012   She underwent right salpingo-oophorectomy on April 1 by Dr. Kenton Kingfisher for removal of what turned out to be a cystic teratoma. Hysteroscopy was also done at the time due  to postmenopausal bleeding. It is unclear  why the left ovary was not taken out since she is postmenopausal.    Granuloma annulare    Hyperlipidemia    Hypertension    Menopause    Plantar fasciitis of right foot 12/24/2014   Squamous cell carcinoma of skin 02/26/2020   R lower pretibial, clear with bx 04/01/20   Squamous cell carcinoma of skin 07/15/2020   R mid pretibia, [facility]    Past Surgical History:  Procedure Laterality Date   APPENDECTOMY     DILATION AND CURETTAGE OF UTERUS     RIGHT OOPHORECTOMY Right feb 2014   [name]   SPINE SURGERY  2010   VENOUS ABLATION     bilateral    Family History  Problem Relation Age of Onset   Early death Mother        durig childbirth   Heart disease Father    Heart attack Father    Heart disease Sister    Cancer Brother    Heart disease Brother        Afib   Breast cancer Neg Hx     SOCIAL HX:  reports that she has never smoked. She has never used smokeless tobacco. She reports that she does not drink alcohol and does not use drugs.    Current Outpatient Medications:    albuterol (VENTOLIN HFA) 108 (90 Base) MCG/ACT inhaler, Inhale 1 puff into the lungs every 6 (six) hours as needed for wheezing or shortness of breath., Disp: 1 each, Rfl: 10   allopurinol (ZYLOPRIM) 100  MG tablet, Take 1 tablet (100 mg total) by mouth daily., Disp: 90 tablet, Rfl: 1   clobetasol cream (TEMOVATE) 0.05 %, , Disp: , Rfl:    ezetimibe (ZETIA) 10 MG tablet, Take 1 tablet (10 mg total) by mouth daily., Disp: 90 tablet, Rfl: 1   fluticasone (FLONASE) 50 MCG/ACT nasal spray, Place 2 sprays into both nostrils daily., Disp: 48 g, Rfl: 1   metoprolol succinate (TOPROL-XL) 25 MG 24 hr tablet, TAKE 1 TABLET EVERY DAY, Disp: 90 tablet, Rfl: 1   pantoprazole (PROTONIX) 40 MG tablet, Take 1 tablet (40 mg total) by mouth daily., Disp: 90 tablet, Rfl: 1   predniSONE (DELTASONE) 10 MG tablet, 6 tablets on Day 1 , then reduce by 1 tablet daily until gone, Disp: 21  tablet, Rfl: 0   PREMARIN vaginal cream, Place 1 Applicatorful vaginally 2 (two) times a week., Disp: 30 g, Rfl: 0   sertraline (ZOLOFT) 100 MG tablet, TAKE 1 TABLET EVERY DAY, Disp: 90 tablet, Rfl: 1   simvastatin (ZOCOR) 40 MG tablet, Take 1 tablet (40 mg total) by mouth at bedtime., Disp: 90 tablet, Rfl: 1   telmisartan (MICARDIS) 80 MG tablet, Take 1 tablet (80 mg total) by mouth daily., Disp: 90 tablet, Rfl: 1   traMADol (ULTRAM) 50 MG tablet, Take 1 tablet (50 mg total) by mouth every 12 (twelve) hours as needed for moderate pain., Disp: 60 tablet, Rfl: 5   ANORO ELLIPTA 62.5-25 MCG/INH AEPB, Inhale 1 puff into the lungs daily. (Patient not taking: Reported on 08/14/2020), Disp: 1 each, Rfl: 6   aspirin EC 81 MG tablet, Take 1 tablet (81 mg total) by mouth daily. (Patient not taking: No sig reported), Disp: 90 tablet, Rfl: 3   Coenzyme Q10 (CO Q 10 PO), Take by mouth daily.  (Patient not taking: Reported on 08/14/2020), Disp: , Rfl:   EXAM:  VITALS per patient if applicable:  GENERAL: alert, oriented, appears well and in no acute distress  HEENT: atraumatic, conjunttiva clear, no obvious abnormalities on inspection of external nose and ears  NECK: normal movements of the head and neck  LUNGS: on inspection no signs of respiratory distress, breathing rate appears normal, no obvious gross SOB, gasping or wheezing  CV: no obvious cyanosis  Jamie: moves all visible extremities without noticeable abnormality  PSYCH/NEURO: pleasant and cooperative, no obvious depression or anxiety, speech and thought processing grossly intact  ASSESSMENT AND PLAN:  Discussed the following assessment and plan:  Shortness of breath - Plan: POCT rapid strep A, COVID-19, Flu A+B and RSV, POC COVID-19, CANCELED: POC COVID-19  Viral upper respiratory tract infection  URI (upper respiratory infection) COVID 19 unlikely GIVEN NEGATIVE RAPID AND PCR.  Strep and influenza also negative.  Will treat for asthma  exacerbation with prednisone taper,  Bronchodilators and cough  suppression     I discussed the assessment and treatment plan with the patient. The patient was provided an opportunity to ask questions and all were answered. The patient agreed with the plan and demonstrated an understanding of the instructions.   The patient was advised to call back or seek an in-person evaluation if the symptoms worsen or if the condition fails to improve as anticipated.   I spent 30 minutes dedicated to the care of this patient on the date of this encounter to include pre-visit review of his medical history,  Face-to-face time with the patient , and post visit ordering of testing and therapeutics.    Crecencio Mc,  MD

## 2020-08-16 LAB — COVID-19, FLU A+B AND RSV
Influenza A, NAA: NOT DETECTED
Influenza B, NAA: NOT DETECTED
RSV, NAA: NOT DETECTED
SARS-CoV-2, NAA: NOT DETECTED

## 2020-08-17 NOTE — Assessment & Plan Note (Signed)
COVID 19 unlikely GIVEN NEGATIVE RAPID AND PCR.  Strep and influenza also negative.  Will treat for asthma exacerbation with prednisone taper,  Bronchodilators and cough  suppression

## 2020-08-18 ENCOUNTER — Ambulatory Visit
Admission: RE | Admit: 2020-08-18 | Discharge: 2020-08-18 | Disposition: A | Discharge status: Home / Self Care | Payer: Medicare Other | Source: Ambulatory Visit | Attending: Adult Health | Admitting: Adult Health

## 2020-08-18 ENCOUNTER — Telehealth (INDEPENDENT_AMBULATORY_CARE_PROVIDER_SITE_OTHER): Payer: Medicare Other | Admitting: Adult Health

## 2020-08-18 ENCOUNTER — Telehealth: Payer: Self-pay

## 2020-08-18 ENCOUNTER — Encounter: Payer: Self-pay | Admitting: Adult Health

## 2020-08-18 ENCOUNTER — Other Ambulatory Visit
Admission: RE | Admit: 2020-08-18 | Discharge: 2020-08-18 | Disposition: A | Discharge status: Home / Self Care | Payer: Medicare Other | Source: Home / Self Care | Attending: Adult Health | Admitting: Adult Health

## 2020-08-18 VITALS — Wt 185.0 lb

## 2020-08-18 DIAGNOSIS — R059 Cough, unspecified: Secondary | ICD-10-CM | POA: Insufficient documentation

## 2020-08-18 DIAGNOSIS — R062 Wheezing: Secondary | ICD-10-CM | POA: Insufficient documentation

## 2020-08-18 DIAGNOSIS — B9689 Other specified bacterial agents as the cause of diseases classified elsewhere: Secondary | ICD-10-CM

## 2020-08-18 DIAGNOSIS — J22 Unspecified acute lower respiratory infection: Secondary | ICD-10-CM | POA: Insufficient documentation

## 2020-08-18 DIAGNOSIS — J988 Other specified respiratory disorders: Secondary | ICD-10-CM | POA: Diagnosis not present

## 2020-08-18 DIAGNOSIS — J4520 Mild intermittent asthma, uncomplicated: Secondary | ICD-10-CM

## 2020-08-18 LAB — CBC WITH DIFFERENTIAL/PLATELET
Abs Immature Granulocytes: 0.02 10*3/uL (ref 0.00–0.07)
Basophils Absolute: 0 10*3/uL (ref 0.0–0.1)
Basophils Relative: 0 %
Eosinophils Absolute: 0 10*3/uL (ref 0.0–0.5)
Eosinophils Relative: 0 %
HCT: 37.5 % (ref 36.0–46.0)
Hemoglobin: 12.4 g/dL (ref 12.0–15.0)
Immature Granulocytes: 0 %
Lymphocytes Relative: 10 %
Lymphs Abs: 0.7 10*3/uL (ref 0.7–4.0)
MCH: 29 pg (ref 26.0–34.0)
MCHC: 33.1 g/dL (ref 30.0–36.0)
MCV: 87.8 fL (ref 80.0–100.0)
Monocytes Absolute: 0.6 10*3/uL (ref 0.1–1.0)
Monocytes Relative: 8 %
Neutro Abs: 5.6 10*3/uL (ref 1.7–7.7)
Neutrophils Relative %: 82 %
Platelets: 189 10*3/uL (ref 150–400)
RBC: 4.27 MIL/uL (ref 3.87–5.11)
RDW: 14.9 % (ref 11.5–15.5)
WBC: 6.9 10*3/uL (ref 4.0–10.5)
nRBC: 0 % (ref 0.0–0.2)

## 2020-08-18 LAB — COMPREHENSIVE METABOLIC PANEL
ALT: 32 U/L (ref 0–44)
AST: 31 U/L (ref 15–41)
Albumin: 4.2 g/dL (ref 3.5–5.0)
Alkaline Phosphatase: 97 U/L (ref 38–126)
Anion gap: 6 (ref 5–15)
BUN: 21 mg/dL (ref 8–23)
CO2: 27 mmol/L (ref 22–32)
Calcium: 9.6 mg/dL (ref 8.9–10.3)
Chloride: 105 mmol/L (ref 98–111)
Creatinine, Ser: 1.01 mg/dL — ABNORMAL HIGH (ref 0.44–1.00)
GFR, Estimated: 56 mL/min — ABNORMAL LOW (ref >60)
Glucose, Bld: 111 mg/dL — ABNORMAL HIGH (ref 70–99)
Potassium: 4.4 mmol/L (ref 3.5–5.1)
Sodium: 138 mmol/L (ref 135–145)
Total Bilirubin: 0.6 mg/dL (ref 0.3–1.2)
Total Protein: 7.2 g/dL (ref 6.5–8.1)

## 2020-08-18 MED ORDER — AMOXICILLIN-POT CLAVULANATE 875-125 MG PO TABS: 1.0000 | ORAL_TABLET | Freq: Two times a day (BID) | ORAL | 0 refills | Status: AC

## 2020-08-18 MED ORDER — ALBUTEROL SULFATE HFA 108 (90 BASE) MCG/ACT IN AERS: 1.0000 | INHALATION_SPRAY | Freq: Four times a day (QID) | RESPIRATORY_TRACT | 0 refills | Status: DC | PRN

## 2020-08-18 MED ORDER — FLUTICASONE-SALMETEROL 100-50 MCG/ACT IN AEPB: 1.0000 | INHALATION_SPRAY | Freq: Two times a day (BID) | RESPIRATORY_TRACT | 3 refills | Status: DC

## 2020-08-18 MED ORDER — BENZONATATE 100 MG PO CAPS: 100.0000 mg | ORAL_CAPSULE | Freq: Two times a day (BID) | ORAL | 0 refills | Status: DC | PRN

## 2020-08-18 NOTE — Telephone Encounter (Signed)
Labs ordered for CBC w/ differential and CMP for future labs

## 2020-08-18 NOTE — Patient Instructions (Signed)
Albuterol Extended-Release Tablets What is this medication? ALBUTEROL (al [place]) treats lung diseases, such as asthma, where the airways in the lungs narrow, causing breathing problems or wheezing (bronchospasm). This medication works by opening the airways of the lungs,making it easier to breathe. This medicine may be used for other purposes; ask your health care provider orpharmacist if you have questions. COMMON BRAND NAME(S): Volmax, VoSpire ER What should I tell my care team before I take this medication? They need to know if you have any of the following conditions: Diabetes (high blood sugar) Heart disease High blood pressure Irregular heartbeat or rhythm Pheochromocytoma Seizures Thyroid disease An unusual or allergic reaction to albuterol, sulfites, other medications, foods, dyes, or preservatives Pregnant or trying to get pregnant Breast-feeding How should I use this medication? Take this medication by mouth with water. Take it as directed on the prescription label at the same time every day. Do not cut, crush or chew this medication. Swallow the tablets whole. You can take it with or without food. If it upsets your stomach, take it with food. Keep taking it unless your care teamtells you to stop. Talk to your care team about the use of this medication in children. While it may be prescribed for children as young as 6 years for selected conditions,precautions do apply. Overdosage: If you think you have taken too much of this medicine contact apoison control center or emergency room at once. NOTE: This medicine is only for you. Do not share this medicine with others. What if I miss a dose? If you miss a dose, take it as soon as you can. If it is almost time for yournext dose, take only that dose. Do not take double or extra doses. What may interact with this medication? Caffeine Chloroquine Cisapride Diuretics Medications for colds Medications for depression or for  emotional or psychotic conditions Medications for weight loss including some herbal products Methadone Pentamidine Some antibiotics like clarithromycin, erythromycin, levofloxacin, and linezolid Some heart medications Steroid hormones such as dexamethasone, cortisone, hydrocortisone Theophylline Thyroid hormones This list may not describe all possible interactions. Give your health care provider a list of all the medicines, herbs, non-prescription drugs, or dietary supplements you use. Also tell them if you smoke, drink alcohol, or use illegaldrugs. Some items may interact with your medicine. What should I watch for while using this medication? Visit your health care provider for regular checks on your progress. Tell your health care provider if your symptoms do not start to get better or if they getworse. You and your health care provider should develop an Asthma Action Plan that is just for you. Be sure to know what to do if you are in the yellow (asthma isgetting worse) or red (medical alert) zones. Do not treat yourself for coughs, colds, or allergies without asking your health care provider for advice. Some nonprescription medications can affectthis one. Your mouth may get dry. Chewing sugarless gum or sucking hard candy and drinking plenty of water may help. Contact your health care provider if theproblem does not go away or is severe. What side effects may I notice from receiving this medication? Side effects that you should report to your care team as soon as possible: Allergic reactions-skin rash, itching, hives, swelling of the face, lips, tongue, or throat Heart rhythm changes-fast or irregular heartbeat, dizziness, feeling faint or lightheaded, chest pain, trouble breathing Increase in blood pressure Muscle pain or cramps Wheezing or trouble breathing that is worse after use Side  effects that usually do not require medical attention (report to your careteam if they continue or are  bothersome): Change in taste Dry mouth Headache Sore throat Tremors or shaking Trouble sleeping This list may not describe all possible side effects. Call your doctor for medical advice about side effects. You may report side effects to FDA at1-800-FDA-1088. Where should I keep my medication? Keep out of the reach of children and pets. Store at room temperature between 20 and 25 degrees C (68 and 77 degrees F). Protect from light. Keep the container tightly closed. Get rid of any unusedmedication after the expiration date. To get rid of medications that are no longer needed or expired: Take the medication to a medication take-back program. Check with your pharmacy or law enforcement to find a location. If you cannot return the medication, check the label or package insert to see if the medication should be thrown out in the garbage or flushed down the toilet. If you are not sure, ask your care team. If it is safe to put in the trash, empty the medication out of the container. Mix the medication with cat litter, dirt, coffee grounds, or other unwanted substance. Seal the mixture in a bag or container. Put it in the trash. NOTE: This sheet is a summary. It may not cover all possible information. If you have questions about this medicine, talk to your doctor, pharmacist, orhealth care provider.  2022 Elsevier/Gold Standard (2020-02-07 12:20:29) Fluticasone; Salmeterol Metered Dose Inhaler (MDI) What is this medication? FLUTICASONE; SALMETEROL (floo TIK a sone; sal ME te role) treats asthma and chronic obstructive pulmonary disease (COPD). It works by opening the airways of the lungs, making it easier to breathe. It is a combination of an inhaled steroid and a bronchodilator. It is often called a controller inhaler. Do notuse it to treat a sudden asthma attack. This medicine may be used for other purposes; ask your health care provider orpharmacist if you have questions. COMMON BRAND NAME(S): Advair  HFA What should I tell my care team before I take this medication? They need to know if you have any of these conditions: Diabetes (high blood sugar) Eye disease, such as glaucoma, cataracts, or blurred vision Heart disease High blood pressure Immune system problems Infections, such as tuberculosis (TB) or other bacterial, fungal, or viral infections Irregular heartbeat or rhythm Liver disease Osteoporosis, weak bones Seizures Taking other steroids like dexamethasone or prednisone Thyroid disease An unusual or allergic reaction to fluticasone, salmeterol, other corticosteroids, other medications, foods, dyes, or preservatives Pregnant or trying to get pregnant Breast-feeding How should I use this medication? This medication is inhaled through the mouth. Rinse your mouth with water after use. Make sure not to swallow the water. Take it as directed on the prescription label at the same time every day. Do not use it more often thandirected. This medication comes with INSTRUCTIONS FOR USE. Ask your pharmacist for directions on how to use this medication. Read the information carefully. Talkto your pharmacist or care team if you have questions. Talk to your care team about the use of this medication in children. While it may be prescribed for children as young as 12 years for selected conditions,precautions do apply. Overdosage: If you think you have taken too much of this medicine contact apoison control center or emergency room at once. NOTE: This medicine is only for you. Do not share this medicine with others. What if I miss a dose? If you miss a dose, use it as  soon as you can. If it is almost time for yournext dose, use only that dose. Do not use double or extra doses. What may interact with this medication? Do not take this medication with any of the following: MAOIs like Carbex, Eldepryl, Marplan, Nardil, and Parnate This medication may also interact with the following: Aminophylline  or theophylline Antiviral medications for HIV or AIDS Beta-blockers like metoprolol and propranolol Certain antibiotics like clarithromycin, erythromycin, levofloxacin, linezolid, and telithromycin Certain medications for fungal infections like ketoconazole, itraconazole, posaconazole, voriconazole Conivaptan Diuretics Medications for colds Medications for depression or emotional conditions Nefazodone Vaccines This list may not describe all possible interactions. Give your health care provider a list of all the medicines, herbs, non-prescription drugs, or dietary supplements you use. Also tell them if you smoke, drink alcohol, or use illegaldrugs. Some items may interact with your medicine. What should I watch for while using this medication? Visit your care team for regular checks on your progress. Tell your care teamif your symptoms do not start to get better or if they get worse. Talk to your care team about how to treat an acute asthma attack or bronchospasm (wheezing). Be sure to always have a short-acting inhaler with you. If you use your short-acting inhaler and your symptoms do not get betteror if they get worse, call your care team right away. You and your care team should develop an Asthma Action Plan that is just for you. Be sure to know what to do if you are in the yellow (asthma is gettingworse) or red (medical alert) zones. This medication can worsen breathing or cause wheezing right after you use it. Be sure you have a short-acting inhaler for acute attacks (wheezing) nearby. Ifthis happens, stop using this medication right away and call your care team. This medication may increase your risk of dying from asthma-related problems.Talk to your care team if you have questions. This medication may increase your risk of getting an infection. Call your care team for advice if you get a fever, chills, sore throat, or other symptoms of a cold or flu. Do not treat yourself. Try to avoid being  around people who are sick. If you have not had the measles or chickenpox vaccines, tell your careteam right away if you are around someone with these viruses. This medication may slow your child's growth if it is taken for a long time athigh doses. Your care team will monitor your child's growth. Using this medication for a long time may weaken your bones. The risk of bonefractures may be increased. Talk to your care team about your bone health. This medication may increase blood sugar. Ask your care team if changes in dietor medications are needed if you have diabetes. Do not treat yourself for coughs, colds or allergies without asking your careteam for advice. Some nonprescription medications can affect this one. What side effects may I notice from receiving this medication? Side effects that you should report to your care team as soon as possible: Allergic reactions-skin rash, itching, hives, swelling of the face, lips, tongue, or throat Flu-like symptoms-fever, chills, muscle pain, cough, headache, fatigue Heart rhythm changes-fast or irregular heartbeat, dizziness, feeling faint or lightheaded, chest pain, trouble breathing Increase in blood pressure Low adrenal gland function-nausea, vomiting, loss of appetite, unusual weakness, fatigue, dizziness Muscle pain or cramps Pain, tingling, or numbness in the hands or feet Sinus pain or pressure around the face or forehead Thrush-white patches in the mouth Wheezing or trouble breathing that is worse  after use Side effects that usually do not require medical attention (report to your careteam if they continue or are bothersome): Change in taste Cough Dry mouth Headache Hoarseness Sore throat Tremors or shaking Trouble sleeping This list may not describe all possible side effects. Call your doctor for medical advice about side effects. You may report side effects to FDA at1-800-FDA-1088. Where should I keep my medication? Keep out of the  reach of children and pets. Store at room temperature between 20 and 25 degrees C (68 and 77 degrees F). Keep inhaler away from extreme heat. Get rid of it when the dose counter reads"000" or after the expiration date, whichever is first. NOTE: This sheet is a summary. It may not cover all possible information. If you have questions about this medicine, talk to your doctor, pharmacist, orhealth care provider.  2022 Elsevier/Gold Standard (2020-04-01 09:47:31) Benzonatate capsules What is this medication? BENZONATATE (ben ZOE na tate) is used to treat cough. This medicine may be used for other purposes; ask your health care provider orpharmacist if you have questions. COMMON BRAND NAME(S): Tessalon Perles, Zonatuss What should I tell my care team before I take this medication? They need to know if you have any of these conditions: kidney or liver disease an unusual or allergic reaction to benzonatate, anesthetics, other medicines, foods, dyes, or preservatives pregnant or trying to get pregnant breast-feeding How should I use this medication? Take this medicine by mouth with a glass of water. Follow the directions on the prescription label. Avoid breaking, chewing, or sucking the capsule, as this can cause serious side effects. Take your medicine at regular intervals. Do nottake your medicine more often than directed. Talk to your pediatrician regarding the use of this medicine in children. While this drug may be prescribed for children as young as 30 years old for selectedconditions, precautions do apply. Overdosage: If you think you have taken too much of this medicine contact apoison control center or emergency room at once. NOTE: This medicine is only for you. Do not share this medicine with others. What if I miss a dose? If you miss a dose, take it as soon as you can. If it is almost time for yournext dose, take only that dose. Do not take double or extra doses. What may interact with this  medication? Do not take this medicine with any of the following medications: MAOIs like Carbex, Eldepryl, Marplan, Nardil, and Parnate This list may not describe all possible interactions. Give your health care provider a list of all the medicines, herbs, non-prescription drugs, or dietary supplements you use. Also tell them if you smoke, drink alcohol, or use illegaldrugs. Some items may interact with your medicine. What should I watch for while using this medication? Tell your doctor if your symptoms do not improve or if they get worse. If youhave a high fever, skin rash, or headache, see your health care professional. You may get drowsy or dizzy. Do not drive, use machinery, or do anything that needs mental alertness until you know how this medicine affects you. Do not sit or stand up quickly, especially if you are an older patient. This reduces therisk of dizzy or fainting spells. What side effects may I notice from receiving this medication? Side effects that you should report to your doctor or health care professionalas soon as possible: allergic reactions like skin rash, itching or hives, swelling of the face, lips, or tongue breathing problems chest pain confusion or hallucinations irregular heartbeat numbness of  mouth or throat seizures Side effects that usually do not require medical attention (report to yourdoctor or health care professional if they continue or are bothersome): burning feeling in the eyes constipation headache nasal congestion stomach upset This list may not describe all possible side effects. Call your doctor for medical advice about side effects. You may report side effects to FDA at1-800-FDA-1088. Where should I keep my medication? Keep out of the reach of children. Store at room temperature between 15 and 30 degrees C (59 and 86 degrees F). Keep tightly closed. Protect from light and moisture. Throw away any unusedmedicine after the expiration date. NOTE:  This sheet is a summary. It may not cover all possible information. If you have questions about this medicine, talk to your doctor, pharmacist, orhealth care provider.  2022 Elsevier/Gold Standard (2007-05-24 14:52:56) Amoxicillin; Clavulanic Acid Tablets What is this medication? AMOXICILLIN; CLAVULANIC ACID (a mox i SIL in; KLAV yoo lan ic AS id) treats infections caused by bacteria. It belongs to a group of medications called penicillin antibiotics. It will not treat colds, the flu, or infections causedby viruses. This medicine may be used for other purposes; ask your health care provider orpharmacist if you have questions. COMMON BRAND NAME(S): Augmentin What should I tell my care team before I take this medication? They need to know if you have any of these conditions: Kidney disease Liver disease Mononucleosis Stomach or intestine problems such as colitis An unusual or allergic reaction to amoxicillin, other penicillin or cephalosporin antibiotics, clavulanic acid, other medications, foods, dyes, or preservatives Pregnant or trying to get pregnant Breast-feeding How should I use this medication? Take this medication by mouth. Take it as directed on the prescription label at the same time every day. Take it with food at the start of a meal or snack. Take all of this medication unless your care team tells you to stop it early.Keep taking it even if you think you are better. Talk to your care team about the use of this medication in children. While itmay be prescribed for selected conditions, precautions do apply. Overdosage: If you think you have taken too much of this medicine contact apoison control center or emergency room at once. NOTE: This medicine is only for you. Do not share this medicine with others. What if I miss a dose? If you miss a dose, take it as soon as you can. If it is almost time for yournext dose, take only that dose. Do not take double or extra doses. What may  interact with this medication? Allopurinol Anticoagulants Birth control pills Methotrexate Probenecid This list may not describe all possible interactions. Give your health care provider a list of all the medicines, herbs, non-prescription drugs, or dietary supplements you use. Also tell them if you smoke, drink alcohol, or use illegaldrugs. Some items may interact with your medicine. What should I watch for while using this medication? Tell your care team if your symptoms do not start to get better or if they getworse. This medication may cause serious skin reactions. They can happen weeks to months after starting the medication. Contact your care team right away if you notice fevers or flu-like symptoms with a rash. The rash may be red or purple and then turn into blisters or peeling of the skin. Or, you might notice a red rash with swelling of the face, lips or lymph nodes in your neck or under yourarms. Do not treat diarrhea with over the counter products. Contact your care team  ifyou have diarrhea that lasts more than 2 days or if it is severe and watery. If you have diabetes, you may get a false-positive result for sugar in yoururine. Check with your care team. Birth control may not work properly while you are taking this medication. Talkto your care team about using an extra method of birth control. What side effects may I notice from receiving this medication? Side effects that you should report to your care team as soon as possible: Allergic reactions-skin rash, itching, hives, swelling of the face, lips, tongue, or throat Liver injury-right upper belly pain, loss of appetite, nausea, light-colored stool, dark yellow or brown urine, yellowing skin or eyes, unusual weakness or fatigue Redness, blistering, peeling, or loosening of the skin, including inside the mouth Severe diarrhea, fever Unusual vaginal discharge, itching, or odor Side effects that usually do not require medical  attention (report to your careteam if they continue or are bothersome): Diarrhea Nausea Vomiting This list may not describe all possible side effects. Call your doctor for medical advice about side effects. You may report side effects to FDA at1-800-FDA-1088. Where should I keep my medication? Keep out of the reach of children and pets. Store at room temperature between 20 and 25 degrees C (68 and 77 degrees F).Throw away any unused medication after the expiration date. NOTE: This sheet is a summary. It may not cover all possible information. If you have questions about this medicine, talk to your doctor, pharmacist, orhealth care provider.  2022 Elsevier/Gold Standard (2020-01-16 09:45:03) Cough, Adult A cough helps to clear your throat and lungs. A cough may be a sign of anillness or another medical condition. An acute cough may only last 2-3 weeks, while a chronic cough may last 8 ormore weeks. Many things can cause a cough. They include: Germs (viruses or bacteria) that attack the airway. Breathing in things that bother (irritate) your lungs. Allergies. Asthma. Mucus that runs down the back of your throat (postnasal drip). Smoking. Acid backing up from the stomach into the tube that moves food from the mouth to the stomach (gastroesophageal reflux). Some medicines. Lung problems. Other medical conditions, such as heart failure or a blood clot in the lung (pulmonary embolism). Follow these instructions at home: Medicines Take over-the-counter and prescription medicines only as told by your doctor. Talk with your doctor before you take medicines that stop a cough (cough suppressants). Lifestyle  Do not smoke, and try not to be around smoke. Do not use any products that contain nicotine or tobacco, such as cigarettes, e-cigarettes, and chewing tobacco. If you need help quitting, ask your doctor. Drink enough fluid to keep your pee (urine) pale yellow. Avoid caffeine. Do not drink  alcohol if your doctor tells you not to drink.  General instructions  Watch for any changes in your cough. Tell your doctor about them. Always cover your mouth when you cough. Stay away from things that make you cough, such as perfume, candles, campfire smoke, or cleaning products. If the air is dry, use a cool mist vaporizer or humidifier in your home. If your cough is worse at night, try using extra pillows to raise your head up higher while you sleep. Rest as needed. Keep all follow-up visits as told by your doctor. This is important.  Contact a doctor if: You have new symptoms. You cough up pus. Your cough does not get better after 2-3 weeks, or your cough gets worse. Cough medicine does not help your cough and you are  not sleeping well. You have pain that gets worse or pain that is not helped with medicine. You have a fever. You are losing weight and you do not know why. You have night sweats. Get help right away if: You cough up blood. You have trouble breathing. Your heartbeat is very fast. These symptoms may be an emergency. Do not wait to see if the symptoms will go away. Get medical help right away. Call your local emergency services (911 in the [place] Do not drive yourself to the hospital. Summary A cough helps to clear your throat and lungs. Many things can cause a cough. Take over-the-counter and prescription medicines only as told by your doctor. Always cover your mouth when you cough. Contact a doctor if you have new symptoms or you have a cough that does not get better or gets worse. This information is not intended to replace advice given to you by your health care provider. Make sure you discuss any questions you have with your healthcare provider. Document Revised: 04/13/2019 Document Reviewed: 03/13/2018 Elsevier Patient Education  [place]. Upper Respiratory Infection, Adult An upper respiratory infection (URI) affects the nose, throat, and upper air  passages. URIs are caused by germs (viruses). The most common type of URI is often called "the common cold." Medicines cannot cure URIs, but you can do things at home to relieve yoursymptoms. URIs usually get better within 7-10 days. Follow these instructions at home: Activity Rest as needed. If you have a fever, stay home from work or school until your fever is gone, or until your doctor says you may return to work or school. You should stay home until you cannot spread the infection anymore (you are not contagious). Your doctor may have you wear a face mask so you have less risk of spreading the infection. Relieving symptoms Gargle with a salt-water mixture 3-4 times a day or as needed. To make a salt-water mixture, completely dissolve -1 tsp of salt in 1 cup of warm water. Use a cool-mist humidifier to add moisture to the air. This can help you breathe more easily. Eating and drinking  Drink enough fluid to keep your pee (urine) pale yellow. Eat soups and other clear broths.  General instructions  Take over-the-counter and prescription medicines only as told by your doctor. These include cold medicines, fever reducers, and cough suppressants. Do not use any products that contain nicotine or tobacco. These include cigarettes and e-cigarettes. If you need help quitting, ask your doctor. Avoid being where people are smoking (avoid secondhand smoke). Make sure you get regular shots and get the flu shot every year. Keep all follow-up visits as told by your doctor. This is important.  How to avoid spreading infection to others  Wash your hands often with soap and water. If you do not have soap and water, use hand sanitizer. Avoid touching your mouth, face, eyes, or nose. Cough or sneeze into a tissue or your sleeve or elbow. Do not cough or sneeze into your hand or into the air.  Contact a doctor if: You are getting worse, not better. You have any of these: A fever. Chills. Brown or  red mucus in your nose. Yellow or brown fluid (discharge)coming from your nose. Pain in your face, especially when you bend forward. Swollen neck glands. Pain with swallowing. White areas in the back of your throat. Get help right away if: You have shortness of breath that gets worse. You have very bad or constant: Headache.  Ear pain. Pain in your forehead, behind your eyes, and over your cheekbones (sinus pain). Chest pain. You have long-lasting (chronic) lung disease along with any of these: Wheezing. Long-lasting cough. Coughing up blood. A change in your usual mucus. You have a stiff neck. You have changes in your: Vision. Hearing. Thinking. Mood. Summary An upper respiratory infection (URI) is caused by a germ called a virus. The most common type of URI is often called "the common cold." URIs usually get better within 7-10 days. Take over-the-counter and prescription medicines only as told by your doctor. This information is not intended to replace advice given to you by your health care provider. Make sure you discuss any questions you have with your healthcare provider. Document Revised: 11/01/2019 Document Reviewed: 11/01/2019 Elsevier Patient Education  [place].

## 2020-08-18 NOTE — Progress Notes (Signed)
Virtual Visit via Video Note  I connected with [name] on 08/18/20 at  3:00 PM EDT by a video enabled telemedicine application and verified that I am speaking with the correct person using two identifiers.   Parties involved in visit as below:    Location: Patient: at home  Provider: Provider: Provider's office at  [facility], [place].      I discussed the limitations of evaluation and management by telemedicine and the availability of in person appointments. The patient expressed understanding and agreed to proceed.  History of Present Illness:   Vitals B/P 170/86 heart rate 74, temperature 97.7 oral.    She tested for covid on 08/15/20 and was negative. Send of test was negative as well. She started with symptoms on 08/13/2020 after attending a funeral out of town.  She also reports she was going to get another inhaler but drug store did not have.   She has a cough, has some mild chest tightness. She is on prednisone.  She has chest congestion. She has light green and sputum.   Patient  denies any fever, body aches,chills, rash, chest pain, shortness of breath, nausea, vomiting, or diarrhea.  Denies dizziness, lightheadedness, pre syncopal or syncopal episodes.   Observations/Objective:   Patient is alert and oriented and responsive to questions Engages in conversation with provider. Speaks in full sentences without any pauses without any shortness of breath or distress.   Assessment and Plan: 1. Bacterial respiratory infection She reports she has not been on her Anoro inhaler for years.  Medications Discontinued During This Encounter  Medication Reason   ANORO ELLIPTA 62.5-25 MCG/INH AEPB Patient has not taken in last 30 days   albuterol (VENTOLIN HFA) 108 (90 Base) MCG/ACT inhaler Reorder    - DG Chest 2 View - amoxicillin-clavulanate (AUGMENTIN) 875-125 MG tablet; Take 1 tablet by mouth 2 (two) times daily for 10 days.  Dispense: 20  tablet; Refill: 0  2. Cough - benzonatate (TESSALON) 100 MG capsule; Take 1 capsule (100 mg total) by mouth 2 (two) times daily as needed for cough.  Dispense: 20 capsule; Refill: 0  3. Wheezing  - fluticasone-salmeterol (ADVAIR) 100-50 MCG/ACT AEPB; Inhale 1 puff into the lungs 2 (two) times daily. Rinse mouth after each use.  Dispense: 1 each; Refill: 3 - albuterol (VENTOLIN HFA) 108 (90 Base) MCG/ACT inhaler; Inhale 1 puff into the lungs every 6 (six) hours as needed for wheezing or shortness of breath.  Dispense: 1 each; Refill: 0  4. Mild intermittent reactive airway disease - albuterol (VENTOLIN HFA) 108 (90 Base) MCG/ACT inhaler; Inhale 1 puff into the lungs every 6 (six) hours as needed for wheezing or shortness of breath.  Dispense: 1 each; Refill: 0   Follow Up Instructions: [facility] now.  Orders Placed This Encounter  Procedures   DG Chest 2 View   CBC with Differential/Platelet   Comprehensive metabolic panel    Return in about 3 days (around 08/21/2020), or if symptoms worsen or fail to improve, for at any time for any worsening symptoms, Go to Emergency room/ urgent care if worse.   Virtual Visit via Telephone Note  I connected with [name] on 08/18/20 at  3:00 PM EDT by telephone and verified that I am speaking with the correct person using two identifiers.   Marcille Buffy, FNP

## 2020-08-19 ENCOUNTER — Telehealth: Payer: Self-pay

## 2020-08-19 DIAGNOSIS — J988 Other specified respiratory disorders: Secondary | ICD-10-CM

## 2020-08-19 DIAGNOSIS — R059 Cough, unspecified: Secondary | ICD-10-CM

## 2020-08-19 NOTE — Telephone Encounter (Signed)
Called and spoke with Jamie Anderson. [name] asks if the appointment on 08/22/20 was in person or if she will be called.

## 2020-08-19 NOTE — Telephone Encounter (Signed)
CMP has been ordered for future Labs.

## 2020-08-19 NOTE — Progress Notes (Signed)
X ray shows changes of chronic bronchitis. No pneumonia seen. Given symptoms would continue acute treatment plan and schedule a soon follow up with her pulmonary Dr. Mortimer Fries.  Follow up visit in person UC/ ER if any symptoms worsening at anytime for hands on evaluation.  IMPRESSION: S CT areas of mild interstitial thickening, likely indicative of a degree of underlying chronic bronchitis. No edema or airspace opacity. Heart size within normal limits. Aortic Atherosclerosis (ICD10-I70.0).   Electronically Signed   By: Lowella Grip III M.D.   On: 08/18/2020 16:34

## 2020-08-19 NOTE — Progress Notes (Signed)
CMP ok , glucose elevated, likely non- fasting. Creatine trivial elevation - GDF decreased mildly likely normal variant just be sure you are drinking well. Recheck at next office visit or within 1 month CMP- fasting and hydrated with water.  CBC is within normal limits.  ( Also see x- ray result when calling)

## 2020-08-19 NOTE — Telephone Encounter (Signed)
Nurse will call patient, no problem.

## 2020-08-22 ENCOUNTER — Ambulatory Visit (INDEPENDENT_AMBULATORY_CARE_PROVIDER_SITE_OTHER): Payer: Medicare Other

## 2020-08-22 VITALS — Ht 65.0 in | Wt 185.0 lb

## 2020-08-22 DIAGNOSIS — Z1231 Encounter for screening mammogram for malignant neoplasm of breast: Secondary | ICD-10-CM

## 2020-08-22 DIAGNOSIS — Z0000 Encounter for general adult medical examination without abnormal findings: Secondary | ICD-10-CM | POA: Diagnosis not present

## 2020-08-22 NOTE — Progress Notes (Addendum)
Subjective:   Jamie Anderson is a 85 y.o. female who presents for Medicare Annual (Subsequent) preventive examination.  Review of Systems    No ROS.  Medicare Wellness Virtual Visit.  Visual/audio telehealth visit, UTA vital signs.   See social history for additional risk factors.   Cardiac Risk Factors include: advanced age (>32men, >19 women)     Objective:    Today's Vitals   08/22/20 1539  Weight: 185 lb (83.9 kg)  Height: 5\' 5"  (1.651 m)   Body mass index is 30.79 kg/m.  Advanced Directives 08/22/2020 08/22/2019 08/17/2018 03/15/2018 05/31/2017 01/18/2017 04/20/2016  Does Patient Have a Medical Advance Directive? Yes Yes Yes Yes Yes Yes Yes  Type of Paramedic of [place];Living will [place];Living will Living will;Healthcare Power of Attorney Living will;Healthcare Power of [place];Living will [place];Living will [place];Living will  Does patient want to make changes to medical advance directive? No - Patient declined No - Patient declined No - Patient declined No - Patient declined No - Patient declined - No - Patient declined  Copy of [place] in Chart? No - copy requested No - copy requested No - copy requested No - copy requested No - copy requested No - copy requested No - copy requested  Would patient like information on creating a medical advance directive? - - - - - No - Patient declined -    Current Medications (verified) Outpatient Encounter Medications as of 08/22/2020  Medication Sig   albuterol (VENTOLIN HFA) 108 (90 Base) MCG/ACT inhaler Inhale 1 puff into the lungs every 6 (six) hours as needed for wheezing or shortness of breath.   allopurinol (ZYLOPRIM) 100 MG tablet Take 1 tablet (100 mg total) by mouth daily.   amoxicillin-clavulanate (AUGMENTIN) 875-125 MG tablet Take 1 tablet by mouth 2 (two) times daily for 10 days.    aspirin EC 81 MG tablet Take 1 tablet (81 mg total) by mouth daily. (Patient not taking: No sig reported)   benzonatate (TESSALON) 100 MG capsule Take 1 capsule (100 mg total) by mouth 2 (two) times daily as needed for cough.   clobetasol cream (TEMOVATE) 0.05 %    Coenzyme Q10 (CO Q 10 PO) Take by mouth daily.   ezetimibe (ZETIA) 10 MG tablet Take 1 tablet (10 mg total) by mouth daily.   fluticasone (FLONASE) 50 MCG/ACT nasal spray Place 2 sprays into both nostrils daily.   fluticasone-salmeterol (ADVAIR) 100-50 MCG/ACT AEPB Inhale 1 puff into the lungs 2 (two) times daily. Rinse mouth after each use.   metoprolol succinate (TOPROL-XL) 25 MG 24 hr tablet TAKE 1 TABLET EVERY DAY   pantoprazole (PROTONIX) 40 MG tablet Take 1 tablet (40 mg total) by mouth daily.   predniSONE (DELTASONE) 10 MG tablet 6 tablets on Day 1 , then reduce by 1 tablet daily until gone   PREMARIN vaginal cream Place 1 Applicatorful vaginally 2 (two) times a week.   sertraline (ZOLOFT) 100 MG tablet TAKE 1 TABLET EVERY DAY   simvastatin (ZOCOR) 40 MG tablet Take 1 tablet (40 mg total) by mouth at bedtime.   telmisartan (MICARDIS) 80 MG tablet Take 1 tablet (80 mg total) by mouth daily.   traMADol (ULTRAM) 50 MG tablet Take 1 tablet (50 mg total) by mouth every 12 (twelve) hours as needed for moderate pain.   No facility-administered encounter medications on file as of 08/22/2020.  Allergies (verified) Biaxin [clarithromycin], Colloidal oatmeal, Corn-containing products, Dust mite extract, Gluten meal, Oat, and Shrimp [shellfish allergy]   History: Past Medical History:  Diagnosis Date   Basal cell carcinoma 01/17/2018   Left chest. Infiltrative. Excised 02/06/2018, margins free.   Cystic teratoma of right ovary 11/23/2012   She underwent right salpingo-oophorectomy on April 1 by Dr. Kenton Kingfisher for removal of what turned out to be a cystic teratoma. Hysteroscopy was also done at the time due to postmenopausal bleeding. It  is unclear  why the left ovary was not taken out since she is postmenopausal.    Granuloma annulare    Hyperlipidemia    Hypertension    Menopause    Plantar fasciitis of right foot 12/24/2014   Squamous cell carcinoma of skin 02/26/2020   R lower pretibial, clear with bx 04/01/20   Squamous cell carcinoma of skin 07/15/2020   R mid pretibia, [facility]   Past Surgical History:  Procedure Laterality Date   APPENDECTOMY     DILATION AND CURETTAGE OF UTERUS     RIGHT OOPHORECTOMY Right feb 2014   [name]   SPINE SURGERY  2010   VENOUS ABLATION     bilateral   Family History  Problem Relation Age of Onset   Early death Mother        durig childbirth   Heart disease Father    Heart attack Father    Heart disease Sister    Cancer Brother    Heart disease Brother        Afib   Breast cancer Neg Hx    Social History   Socioeconomic History   Marital status: Widowed    Spouse name: Not on file   Number of children: Not on file   Years of education: Not on file   Highest education level: Not on file  Occupational History   Not on file  Tobacco Use   Smoking status: Never   Smokeless tobacco: Never  Vaping Use   Vaping Use: Never used  Substance and Sexual Activity   Alcohol use: No    Alcohol/week: 4.0 standard drinks    Types: 4 Glasses of wine per week   Drug use: No   Sexual activity: Not Currently  Other Topics Concern   Not on file  Social History Narrative   Not on file   Social Determinants of Health   Financial Resource Strain: Low Risk    Difficulty of Paying Living Expenses: Not hard at all  Food Insecurity: No Food Insecurity   Worried About Charity fundraiser in the Last Year: Never true   [place] in the Last Year: Never true  Transportation Needs: No Transportation Needs   Lack of Transportation (Medical): No   Lack of Transportation (Non-Medical): No  Physical Activity: Unknown   Days of Exercise per Week: 0 days   Minutes of Exercise per  Session: Not on file  Stress: No Stress Concern Present   Feeling of Stress : Not at all  Social Connections: Unknown   Frequency of Communication with Friends and Family: More than three times a week   Frequency of Social Gatherings with Friends and Family: More than three times a week   Attends Religious Services: Not on file   Active Member of Clubs or Organizations: Not on file   Attends Archivist Meetings: Not on file   Marital Status: Not on file    Tobacco Counseling Counseling given: Not Answered  Clinical Intake:  Pre-visit preparation completed: Yes        Diabetes: No  How often do you need to have someone help you when you read instructions, pamphlets, or other written materials from your doctor or pharmacy?: 1 - Never  Interpreter Needed?: No      Activities of Daily Living In your present state of health, do you have any difficulty performing the following activities: 08/22/2020  Hearing? N  Vision? N  Difficulty concentrating or making decisions? N  Walking or climbing stairs? Y  Comment Chronic back and knee pain. Paces self.  Dressing or bathing? N  Doing errands, shopping? N  Preparing Food and eating ? N  Using the Toilet? N  In the past six months, have you accidently leaked urine? Y  Comment Managed with daily liner  Do you have problems with loss of bowel control? N  Managing your Medications? N  Managing your Finances? N  Housekeeping or managing your Housekeeping? N  Some recent data might be hidden    Patient Care Team: Crecencio Mc, MD as PCP - General (Internal Medicine) Minna Merritts, MD as PCP - Cardiology (Cardiology)  Indicate any recent Medical Services you may have received from other than [facility] providers in the past year (date may be approximate).     Assessment:   This is a routine wellness examination for [place].  I connected with Izora Gala today by telephone and verified that I am speaking with the correct  person using two identifiers. Location patient: home Location provider: work Persons participating in the virtual visit: patient, Marine scientist.    I discussed the limitations, risks, security and privacy concerns of performing an evaluation and management service by telephone and the availability of in person appointments. The patient expressed understanding and verbally consented to this telephonic visit.    Interactive audio and video telecommunications were attempted between this provider and patient, however failed, due to patient having technical difficulties OR patient did not have access to video capability.  We continued and completed visit with audio only.  Some vital signs may be absent or patient reported.   Hearing/Vision screen Hearing Screening - Comments:: Patient is able to hear conversational tones without difficulty.  No issues reported.   Vision Screening - Comments:: Visual acuity not assessed, virtual visit.  They have seen their ophthalmologist in the last 12 months.    Dietary issues and exercise activities discussed: Current Exercise Habits: The patient does not participate in regular exercise at present, Intensity: Mild Healthy diet Good water intake   Goals Addressed               This Visit's Progress     Patient Stated     Healthy Lifestyle (pt-stated)        Walk more for exercise Monitor diet better        Depression Screen PHQ 2/9 Scores 08/22/2020 08/18/2020 07/29/2020 12/07/2019 08/22/2019 10/30/2018 08/17/2018  PHQ - 2 Score 0 0 0 0 0 0 0  PHQ- 9 Score 0 - 0 - - 0 -    Fall Risk Fall Risk  08/22/2020 08/18/2020 08/14/2020 07/29/2020 12/07/2019  Falls in the past year? 0 0 0 0 0  Number falls in past yr: 0 0 - - 0  Injury with Fall? 0 0 - - 0  Follow up Falls evaluation completed Falls evaluation completed Falls evaluation completed Falls evaluation completed Falls evaluation completed    [place]:  Handrails in  use when climbing stairs? Yes Home free of loose throw rugs in walkways, pet beds, electrical cords, etc? Yes  Adequate lighting in your home to reduce risk of falls? Yes   ASSISTIVE DEVICES UTILIZED TO PREVENT FALLS: Life alert? No  Use of a cane, walker or w/c? No    TIMED UP AND GO: Was the test performed? No .   Shingles 2 part vaccine- deferred per patient.   Cognitive Function: Patient is alert and oriented x3.  Denies difficulty focusing, making decisions, memory loss. MMSE/6CIT deferred. Normal by direct communication/observation.   MMSE - Mini Mental State Exam 04/20/2016  Orientation to time 5  Orientation to Place 5  Registration 3  Attention/ Calculation 5  Recall 3  Language- name 2 objects 2  Language- repeat 1  Language- follow 3 step command 3  Language- read & follow direction 1  Write a sentence 1  Copy design 1  Total score 30     6CIT Screen 08/22/2019 08/17/2018 05/31/2017  What Year? 0 points 0 points 0 points  What month? 0 points 0 points 0 points  What time? 0 points 0 points 0 points  Count back from 20 - 0 points 0 points  Months in reverse 0 points 0 points 2 points  Repeat phrase 0 points - 0 points  Total Score - - 2    Immunizations Immunization History  Administered Date(s) Administered   Influenza Split 11/25/2011   Influenza, High Dose Seasonal PF 01/30/2018   Influenza,inj,Quad PF,6+ Mos 11/22/2012   Influenza,inj,quad, With Preservative 12/07/2018   Influenza-Unspecified 12/20/2013, 01/07/2015, 01/09/2016, 12/15/2016, 12/06/2019   PFIZER(Purple Top)SARS-COV-2 Vaccination 04/28/2019, 05/23/2019, 01/01/2020, 07/31/2020   Pneumococcal Conjugate-13 01/03/2014   Pneumococcal Polysaccharide-23 11/25/2011, 02/09/2018   Tdap 12/06/2013   Zoster, Live 12/29/2009     Health Maintenance Health Maintenance  Topic Date Due   Zoster Vaccines- Shingrix (1 of 2) 11/22/2020 (Originally 10/22/1954)   INFLUENZA VACCINE  10/06/2020    COVID-19 Vaccine (5 - Booster for [place] series) 12/01/2020   TETANUS/TDAP  12/07/2023   DEXA SCAN  Completed   PNA vac Low Risk Adult  Completed   HPV VACCINES  Aged Out    Mammogram status: Completed 11/2019. Repeat every year. Ordered per consent.   Lung Cancer Screening: (Low Dose CT Chest recommended if Age 11-80 years, 30 pack-year currently smoking OR have quit w/in 15years.) does not qualify.   Hepatitis C Screening: does not qualify Vision Screening: Recommended annual ophthalmology exams for early detection of glaucoma and other disorders of the eye. Is the patient up to date with their annual eye exam?  Yes   Dental Screening: Recommended annual dental exams for proper oral hygiene  Community Resource Referral / Chronic Care Management: CRR required this visit?  No   CCM required this visit?  No      Plan:   Keep all routine maintenance appointments.   I have personally reviewed and noted the following in the patient's chart:   Medical and social history Use of alcohol, tobacco or illicit drugs  Current medications and supplements including opioid prescriptions.  Functional ability and status Nutritional status Physical activity Advanced directives List of other physicians Hospitalizations, surgeries, and ER visits in previous 12 months Vitals Screenings to include cognitive, depression, and falls Referrals and appointments  In addition, I have reviewed and discussed with patient certain preventive protocols, quality metrics, and best practice recommendations. A written personalized care plan for preventive services as well as  general preventive health recommendations were provided to patient via mychart.     OBrien-Blaney, Giomar Gusler L, LPN   7/47/1595     I have reviewed the above information and agree with above.   Deborra Medina, MD

## 2020-08-22 NOTE — Patient Instructions (Addendum)
[name] , Thank you for taking time to come for your Medicare Wellness Visit. I appreciate your ongoing commitment to your health goals. Please review the following plan we discussed and let me know if I can assist you in the future.   These are the goals we discussed:  Goals       Patient Stated     Healthy Lifestyle (pt-stated)      Walk more for exercise Monitor diet better         This is a list of the screening recommended for you and due dates:  Health Maintenance  Topic Date Due   Zoster (Shingles) Vaccine (1 of 2) 11/22/2020*   Flu Shot  10/06/2020   COVID-19 Vaccine (5 - Booster for Pfizer series) 12/01/2020   Tetanus Vaccine  12/07/2023   DEXA scan (bone density measurement)  Completed   Pneumonia vaccines  Completed   HPV Vaccine  Aged Out  *Topic was postponed. The date shown is not the original due date.    Advanced directives: End of life planning; Advance aging; Advanced directives discussed.  Copy of current HCPOA/Living Will requested.    Conditions/risks identified: none new  Follow up in one year for your annual wellness visit    Preventive Care 65 Years and Older, Female Preventive care refers to lifestyle choices and visits with your health care provider that can promote health and wellness. What does preventive care include? A yearly physical exam. This is also called an annual well check. Dental exams once or twice a year. Routine eye exams. Ask your health care provider how often you should have your eyes checked. Personal lifestyle choices, including: Daily care of your teeth and gums. Regular physical activity. Eating a healthy diet. Avoiding tobacco and drug use. Limiting alcohol use. Practicing safe sex. Taking low-dose aspirin every day. Taking vitamin and mineral supplements as recommended by your health care provider. What happens during an annual well check? The services and screenings done by your health care provider during  your annual well check will depend on your age, overall health, lifestyle risk factors, and family history of disease. Counseling  Your health care provider may ask you questions about your: Alcohol use. Tobacco use. Drug use. Emotional well-being. Home and relationship well-being. Sexual activity. Eating habits. History of falls. Memory and ability to understand (cognition). Work and work Statistician. Reproductive health. Screening  You may have the following tests or measurements: Height, weight, and BMI. Blood pressure. Lipid and cholesterol levels. These may be checked every 5 years, or more frequently if you are over 5 years old. Skin check. Lung cancer screening. You may have this screening every year starting at age 54 if you have a 30-pack-year history of smoking and currently smoke or have quit within the past 15 years. Fecal occult blood test (FOBT) of the stool. You may have this test every year starting at age 71. Flexible sigmoidoscopy or colonoscopy. You may have a sigmoidoscopy every 5 years or a colonoscopy every 10 years starting at age 77. Hepatitis C blood test. Hepatitis B blood test. Sexually transmitted disease (STD) testing. Diabetes screening. This is done by checking your blood sugar (glucose) after you have not eaten for a while (fasting). You may have this done every 1-3 years. Bone density scan. This is done to screen for osteoporosis. You may have this done starting at age 74. Mammogram. This may be done every 1-2 years. Talk to your health care provider about how often  you should have regular mammograms. Talk with your health care provider about your test results, treatment options, and if necessary, the need for more tests. Vaccines  Your health care provider may recommend certain vaccines, such as: Influenza vaccine. This is recommended every year. Tetanus, diphtheria, and acellular pertussis (Tdap, Td) vaccine. You may need a Td booster every 10  years. Zoster vaccine. You may need this after age 71. Pneumococcal 13-valent conjugate (PCV13) vaccine. One dose is recommended after age 58. Pneumococcal polysaccharide (PPSV23) vaccine. One dose is recommended after age 15. Talk to your health care provider about which screenings and vaccines you need and how often you need them. This information is not intended to replace advice given to you by your health care provider. Make sure you discuss any questions you have with your health care provider. Document Released: 03/21/2015 Document Revised: 11/12/2015 Document Reviewed: 12/24/2014 Elsevier Interactive Patient Education  2017 [place] Prevention in the Home Falls can cause injuries. They can happen to people of all ages. There are many things you can do to make your home safe and to help prevent falls. What can I do on the outside of my home? Regularly fix the edges of walkways and driveways and fix any cracks. Remove anything that might make you trip as you walk through a door, such as a raised step or threshold. Trim any bushes or trees on the path to your home. Use bright outdoor lighting. Clear any walking paths of anything that might make someone trip, such as rocks or tools. Regularly check to see if handrails are loose or broken. Make sure that both sides of any steps have handrails. Any raised decks and porches should have guardrails on the edges. Have any leaves, snow, or ice cleared regularly. Use sand or salt on walking paths during winter. Clean up any spills in your garage right away. This includes oil or grease spills. What can I do in the bathroom? Use night lights. Install grab bars by the toilet and in the tub and shower. Do not use towel bars as grab bars. Use non-skid mats or decals in the tub or shower. If you need to sit down in the shower, use a plastic, non-slip stool. Keep the floor dry. Clean up any water that spills on the floor as soon as it  happens. Remove soap buildup in the tub or shower regularly. Attach bath mats securely with double-sided non-slip rug tape. Do not have throw rugs and other things on the floor that can make you trip. What can I do in the bedroom? Use night lights. Make sure that you have a light by your bed that is easy to reach. Do not use any sheets or blankets that are too big for your bed. They should not hang down onto the floor. Have a firm chair that has side arms. You can use this for support while you get dressed. Do not have throw rugs and other things on the floor that can make you trip. What can I do in the kitchen? Clean up any spills right away. Avoid walking on wet floors. Keep items that you use a lot in easy-to-reach places. If you need to reach something above you, use a strong step stool that has a grab bar. Keep electrical cords out of the way. Do not use floor polish or wax that makes floors slippery. If you must use wax, use non-skid floor wax. Do not have throw rugs and other things on  the floor that can make you trip. What can I do with my stairs? Do not leave any items on the stairs. Make sure that there are handrails on both sides of the stairs and use them. Fix handrails that are broken or loose. Make sure that handrails are as long as the stairways. Check any carpeting to make sure that it is firmly attached to the stairs. Fix any carpet that is loose or worn. Avoid having throw rugs at the top or bottom of the stairs. If you do have throw rugs, attach them to the floor with carpet tape. Make sure that you have a light switch at the top of the stairs and the bottom of the stairs. If you do not have them, ask someone to add them for you. What else can I do to help prevent falls? Wear shoes that: Do not have high heels. Have rubber bottoms. Are comfortable and fit you well. Are closed at the toe. Do not wear sandals. If you use a stepladder: Make sure that it is fully opened.  Do not climb a closed stepladder. Make sure that both sides of the stepladder are locked into place. Ask someone to hold it for you, if possible. Clearly mark and make sure that you can see: Any grab bars or handrails. First and last steps. Where the edge of each step is. Use tools that help you move around (mobility aids) if they are needed. These include: Canes. Walkers. Scooters. Crutches. Turn on the lights when you go into a dark area. Replace any light bulbs as soon as they burn out. Set up your furniture so you have a clear path. Avoid moving your furniture around. If any of your floors are uneven, fix them. If there are any pets around you, be aware of where they are. Review your medicines with your doctor. Some medicines can make you feel dizzy. This can increase your chance of falling. Ask your doctor what other things that you can do to help prevent falls. This information is not intended to replace advice given to you by your health care provider. Make sure you discuss any questions you have with your health care provider. Document Released: 12/19/2008 Document Revised: 07/31/2015 Document Reviewed: 03/29/2014 Elsevier Interactive Patient Education  2017 [employer].

## 2020-08-25 ENCOUNTER — Ambulatory Visit: Payer: Medicare Other | Admitting: Adult Health

## 2020-08-25 NOTE — Telephone Encounter (Signed)
Noted  

## 2020-08-25 NOTE — Telephone Encounter (Signed)
Patient is agreeable and 12:30 slot has been created. I do not have clearance to schedule this appointment as it is an add on to Dr Lupita Dawn half day.   Could you please schedule this Patient of an in person office visit?

## 2020-08-25 NOTE — Telephone Encounter (Signed)
Please advise, there are no current openings for you this week

## 2020-08-25 NOTE — Telephone Encounter (Signed)
Had to reschedule pt appt for today with [facility]. Pt is still having a cough but is doing better,she wanted to know if she could be worked in with Dr. Derrel Nip for an in office appt and to have labs done

## 2020-08-26 ENCOUNTER — Ambulatory Visit: Payer: Medicare HMO | Admitting: Dermatology

## 2020-08-26 ENCOUNTER — Other Ambulatory Visit: Payer: Self-pay

## 2020-08-26 ENCOUNTER — Ambulatory Visit (INDEPENDENT_AMBULATORY_CARE_PROVIDER_SITE_OTHER): Payer: Medicare Other | Admitting: Internal Medicine

## 2020-08-26 ENCOUNTER — Encounter: Payer: Self-pay | Admitting: Internal Medicine

## 2020-08-26 DIAGNOSIS — J22 Unspecified acute lower respiratory infection: Secondary | ICD-10-CM | POA: Diagnosis not present

## 2020-08-26 DIAGNOSIS — J029 Acute pharyngitis, unspecified: Secondary | ICD-10-CM | POA: Diagnosis not present

## 2020-08-26 MED ORDER — CHERATUSSIN AC 100-10 MG/5ML PO SOLN: 5.0000 mL | Freq: Three times a day (TID) | ORAL | 0 refills | Status: DC | PRN

## 2020-08-26 MED ORDER — PREDNISONE 10 MG PO TABS: ORAL_TABLET | ORAL | 0 refills | Status: DC

## 2020-08-26 MED ORDER — NYSTATIN 100000 UNIT/ML MT SUSP: 5.0000 mL | Freq: Four times a day (QID) | OROMUCOSAL | 0 refills | Status: DC

## 2020-08-26 NOTE — Patient Instructions (Addendum)
  I am repeating your prednisone taper for 6 more days   Use the nystatin liquid after each puff of the fluticasone inhaler, to prevent thrush.  Swish aroudn in your mouth for 30 seconds,  then swallow   Use the albuterol iinhaler n between the 2 daily dose of fluticasone , for wheezing   Start using allegra once daily for post nasal drip (OTC) available as fexofenadine in the generic form . Much cheaper   The other liquid is a Cough syrup which has codeine in it ;  it should quiet down the cough  and may make you sleepy

## 2020-08-26 NOTE — Progress Notes (Signed)
Subjective:  Patient ID: Jamie Anderson, female    DOB: 1935-10-28  Age: 85 y.o. MRN: [id number]  CC: Diagnoses of Lower respiratory infection and Pharyngitis, unspecified etiology were pertinent to this visit.  HPI Jamie Anderson presents for follow up on bronchitis  This visit occurred during the SARS-CoV-2 public health emergency.  Safety protocols were in place, including screening questions prior to the visit, additional usage of staff PPE, and extensive cleaning of exam room while observing appropriate contact time as indicated for disinfecting solutions.   85 yr old female with chronic bronchitis managed by pulmonologist treated on June 13 virtually for acute bronchitis  had booster on may 26.  Attended an  indoor funeral several  days before symptoms began.  Home test was negative  august 10 for COVID.  Symptoms  started on august 8,   finished augmentin x 7 days  6 day prednisone taper, cough suppressant and Advair  MDI  tessalon for cough.   Presents today for follow up on persistent symptoms.  She notes slight improvement:  Last night slept on 1 pillow (down from 3) cough is  persistent and seems to be worse at night and if she goes outside to water her flowers. So she has stopped going outside.  Stopped using advair 2 days ago because throat started to hurt too much.  (No thrush on exam ).  Noted to be  coughing .     Ambulatory sats on room air were noted to briefly drop to 91% for < 3 sec and returned to 97% with continued ambulation   Outpatient Medications Prior to Visit  Medication Sig Dispense Refill   albuterol (VENTOLIN HFA) 108 (90 Base) MCG/ACT inhaler Inhale 1 puff into the lungs every 6 (six) hours as needed for wheezing or shortness of breath. 1 each 0   allopurinol (ZYLOPRIM) 100 MG tablet Take 1 tablet (100 mg total) by mouth daily. 90 tablet 1   amoxicillin-clavulanate (AUGMENTIN) 875-125 MG tablet Take 1 tablet by mouth 2 (two) times daily for 10 days. 20 tablet 0    aspirin EC 81 MG tablet Take 1 tablet (81 mg total) by mouth daily. 90 tablet 3   benzonatate (TESSALON) 100 MG capsule Take 1 capsule (100 mg total) by mouth 2 (two) times daily as needed for cough. 20 capsule 0   clobetasol cream (TEMOVATE) 0.05 %      Coenzyme Q10 (CO Q 10 PO) Take by mouth daily.     ezetimibe (ZETIA) 10 MG tablet Take 1 tablet (10 mg total) by mouth daily. 90 tablet 1   fluticasone (FLONASE) 50 MCG/ACT nasal spray Place 2 sprays into both nostrils daily. 48 g 1   fluticasone-salmeterol (ADVAIR) 100-50 MCG/ACT AEPB Inhale 1 puff into the lungs 2 (two) times daily. Rinse mouth after each use. 1 each 3   metoprolol succinate (TOPROL-XL) 25 MG 24 hr tablet TAKE 1 TABLET EVERY DAY 90 tablet 1   pantoprazole (PROTONIX) 40 MG tablet Take 1 tablet (40 mg total) by mouth daily. 90 tablet 1   PREMARIN vaginal cream Place 1 Applicatorful vaginally 2 (two) times a week. 30 g 0   sertraline (ZOLOFT) 100 MG tablet TAKE 1 TABLET EVERY DAY 90 tablet 1   simvastatin (ZOCOR) 40 MG tablet Take 1 tablet (40 mg total) by mouth at bedtime. 90 tablet 1   telmisartan (MICARDIS) 80 MG tablet Take 1 tablet (80 mg total) by mouth daily. 90 tablet 1   traMADol (  ULTRAM) 50 MG tablet Take 1 tablet (50 mg total) by mouth every 12 (twelve) hours as needed for moderate pain. 60 tablet 5   predniSONE (DELTASONE) 10 MG tablet 6 tablets on Day 1 , then reduce by 1 tablet daily until gone 21 tablet 0   No facility-administered medications prior to visit.    Review of Systems;  Patient denies headache, fevers, malaise, unintentional weight loss, skin rash, eye pain, sinus congestion and sinus pain, sore throat, dysphagia,  hemoptysis , cough, dyspnea, wheezing, chest pain, palpitations, orthopnea, edema, abdominal pain, nausea, melena, diarrhea, constipation, flank pain, dysuria, hematuria, urinary  Frequency, nocturia, numbness, tingling, seizures,  Focal weakness, Loss of consciousness,  Tremor, insomnia,  depression, anxiety, and suicidal ideation.      Objective:  BP (!) 126/58 (BP Location: Left Arm, Patient Position: Sitting, Cuff Size: Large)   Pulse 82   Resp 17   Ht 5\' 5"  (1.651 m)   Wt 185 lb (83.9 kg)   SpO2 97%   BMI 30.79 kg/m   BP Readings from Last 3 Encounters:  08/26/20 (!) 126/58  08/14/20 (!) 162/74  07/29/20 132/68    Wt Readings from Last 3 Encounters:  08/26/20 185 lb (83.9 kg)  08/22/20 185 lb (83.9 kg)  08/18/20 185 lb (83.9 kg)    General appearance: alert, cooperative and appears stated age Ears: normal TM's and external ear canals both ears Throat: lips, mucosa, normal; tongue beefy red,  no thrush  teeth and gums normal Neck: no adenopathy, no carotid bruit, supple, symmetrical, trachea midline and thyroid not enlarged, symmetric, no tenderness/mass/nodules Back: symmetric, no curvature. ROM normal. No CVA tenderness. Lungs: clear to auscultation bilaterally Heart: regular rate and rhythm, S1, S2 normal, no murmur, click, rub or gallop Abdomen: soft, non-tender; bowel sounds normal; no masses,  no organomegaly Pulses: 2+ and symmetric Skin: Skin color, texture, turgor normal. No rashes or lesions Lymph nodes: Cervical, supraclavicular, and axillary nodes normal.  Lab Results  Component Value Date   HGBA1C 5.5 01/30/2018   HGBA1C 5.4 01/15/2015    Lab Results  Component Value Date   CREATININE 1.01 (H) 08/18/2020   CREATININE 1.08 07/29/2020   CREATININE 0.95 09/05/2019    Lab Results  Component Value Date   WBC 6.9 08/18/2020   HGB 12.4 08/18/2020   HCT 37.5 08/18/2020   PLT 189 08/18/2020   GLUCOSE 111 (H) 08/18/2020   CHOL 166 07/29/2020   TRIG 122.0 07/29/2020   HDL 72.80 07/29/2020   LDLDIRECT 59.0 01/30/2018   LDLCALC 69 07/29/2020   ALT 32 08/18/2020   AST 31 08/18/2020   NA 138 08/18/2020   K 4.4 08/18/2020   CL 105 08/18/2020   CREATININE 1.01 (H) 08/18/2020   BUN 21 08/18/2020   CO2 27 08/18/2020   TSH 1.55  07/29/2020   INR 0.9 05/29/2012   HGBA1C 5.5 01/30/2018   MICROALBUR <0.7 04/20/2016    DG Chest 2 View  Result Date: 08/18/2020 CLINICAL DATA:  Cough and congestion EXAM: CHEST - 2 VIEW COMPARISON:  January 18, 2017 chest radiograph and chest CT FINDINGS: No edema or airspace opacity. There are scattered areas of mild interstitial thickening. Heart size and pulmonary vascularity are within normal limits. No adenopathy. There is degenerative change in the thoracic spine. There is aortic atherosclerosis. IMPRESSION: S CT areas of mild interstitial thickening, likely indicative of a degree of underlying chronic bronchitis. No edema or airspace opacity. Heart size within normal limits. Aortic Atherosclerosis (ICD10-I70.0). Electronically  Signed   By: Lowella Grip III M.D.   On: 08/18/2020 16:34    Assessment & Plan:   Problem List Items Addressed This Visit       Unprioritized   Lower respiratory infection    Only exposure was an indoor funeral.  Likely COVID with a false negative test on day 3 of symptoms.  Too late to treat.  Not hypoxic.  Repeat prednisone taper, add cheratussin and allegra.        Relevant Medications   nystatin (MYCOSTATIN) 100000 UNIT/ML suspension   Pharyngitis    Likely early thrush due to use of steroids.  Nystatin prescribed        I provided  30 minutes of  face-to-face time during this encounter reviewing patient's recent chest films , office visits  current ambulator sats,  providing counseling on the above mentioned problems , and coordination  of care .   I am having Jamie Anderson start on Cheratussin AC and nystatin. I am also having her maintain her Coenzyme Q10 (CO Q 10 PO), Premarin, aspirin EC, traMADol, clobetasol cream, metoprolol succinate, sertraline, simvastatin, allopurinol, ezetimibe, pantoprazole, fluticasone, telmisartan, amoxicillin-clavulanate, fluticasone-salmeterol, benzonatate, albuterol, and predniSONE.  Meds ordered this  encounter  Medications   predniSONE (DELTASONE) 10 MG tablet    Sig: 6 tablets on Day 1 , then reduce by 1 tablet daily until gone    Dispense:  21 tablet    Refill:  0   guaiFENesin-codeine (CHERATUSSIN AC) 100-10 MG/5ML syrup    Sig: Take 5 mLs by mouth 3 (three) times daily as needed for cough.    Dispense:  120 mL    Refill:  0   nystatin (MYCOSTATIN) 100000 UNIT/ML suspension    Sig: Take 5 mLs (500,000 Units total) by mouth 4 (four) times daily.    Dispense:  60 mL    Refill:  0    Medications Discontinued During This Encounter  Medication Reason   predniSONE (DELTASONE) 10 MG tablet Reorder    Follow-up: No follow-ups on file.   Crecencio Mc, MD

## 2020-08-26 NOTE — Assessment & Plan Note (Signed)
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  Fernando Salinas with a false negative test on day 3 of symptoms.  Too late to treat.  Not hypoxic.  Repeat prednisone taper, add cheratussin and allegra.

## 2020-08-26 NOTE — Assessment & Plan Note (Signed)
Likely early thrush due to use of steroids.  Nystatin prescribed

## 2020-09-02 ENCOUNTER — Other Ambulatory Visit: Payer: Self-pay | Admitting: Internal Medicine

## 2020-09-04 ENCOUNTER — Other Ambulatory Visit: Payer: Self-pay | Admitting: Internal Medicine

## 2020-09-04 DIAGNOSIS — M25561 Pain in right knee: Secondary | ICD-10-CM

## 2020-09-19 ENCOUNTER — Other Ambulatory Visit (INDEPENDENT_AMBULATORY_CARE_PROVIDER_SITE_OTHER): Payer: Medicare Other

## 2020-09-19 ENCOUNTER — Other Ambulatory Visit: Payer: Medicare Other

## 2020-09-19 ENCOUNTER — Other Ambulatory Visit: Payer: Self-pay

## 2020-09-19 DIAGNOSIS — R059 Cough, unspecified: Secondary | ICD-10-CM | POA: Diagnosis not present

## 2020-09-19 DIAGNOSIS — B9689 Other specified bacterial agents as the cause of diseases classified elsewhere: Secondary | ICD-10-CM | POA: Diagnosis not present

## 2020-09-19 DIAGNOSIS — J988 Other specified respiratory disorders: Secondary | ICD-10-CM | POA: Diagnosis not present

## 2020-09-19 LAB — COMPREHENSIVE METABOLIC PANEL
ALT: 16 U/L (ref 0–35)
AST: 17 U/L (ref 0–37)
Albumin: 4.1 g/dL (ref 3.5–5.2)
Alkaline Phosphatase: 100 U/L (ref 39–117)
BUN: 16 mg/dL (ref 6–23)
CO2: 29 mEq/L (ref 19–32)
Calcium: 9.4 mg/dL (ref 8.4–10.5)
Chloride: 106 mEq/L (ref 96–112)
Creatinine, Ser: 0.93 mg/dL (ref 0.40–1.20)
GFR: 57.08 mL/min — ABNORMAL LOW (ref >60.00)
Glucose, Bld: 96 mg/dL (ref 70–99)
Potassium: 4.3 mEq/L (ref 3.5–5.1)
Sodium: 142 mEq/L (ref 135–145)
Total Bilirubin: 0.4 mg/dL (ref 0.2–1.2)
Total Protein: 6.1 g/dL (ref 6.0–8.3)

## 2020-09-22 ENCOUNTER — Encounter: Payer: Self-pay | Admitting: Internal Medicine

## 2020-09-22 ENCOUNTER — Ambulatory Visit: Payer: Medicare Other | Admitting: Internal Medicine

## 2020-09-22 ENCOUNTER — Other Ambulatory Visit: Payer: Self-pay

## 2020-09-22 VITALS — BP 120/60 | HR 87 | Temp 98.0°F | Ht 64.0 in | Wt 186.2 lb

## 2020-09-22 DIAGNOSIS — J479 Bronchiectasis, uncomplicated: Secondary | ICD-10-CM

## 2020-09-22 DIAGNOSIS — J449 Chronic obstructive pulmonary disease, unspecified: Secondary | ICD-10-CM

## 2020-09-22 MED ORDER — NYSTATIN 100000 UNIT/ML MT SUSP: 5.0000 mL | Freq: Four times a day (QID) | OROMUCOSAL | 5 refills | Status: DC

## 2020-09-22 NOTE — Patient Instructions (Signed)
Use albuterol 2 to 4 puffs every 4 hours as needed for cough and wheezing  Continue to use nystatin suspension as needed  Hold Advair for now  Please wear mask when able and when around people Avoid sick contacts Avoid secondhand smoke  avoid allergens

## 2020-09-22 NOTE — Progress Notes (Signed)
Name: Jamie Anderson MRN: [id number] DOB: 29-Aug-1935     CONSULTATION DATE: 4.8.19 REFERRING MD : Derrel Nip   STUDIES:    11.18.18 CT chest Independently reviewed by Me No acute process No pneumonia No masses seen  PFTs 2019 Spirometry Data Is Acceptable and Reproducible Possible  Mild Obstructive Airways Disease without Significant Broncho-Dilator Response +air trapping     CHIEF COMPLAINT:  Follow-up COPD  Follow-up shortness of breath    HISTORY OF PRESENT ILLNESS:  No exacerbation at this time No evidence of heart failure at this time No evidence or signs of infection at this time No respiratory distress No fevers, chills, nausea, vomiting, diarrhea No evidence of lower extremity edema No evidence hemoptysis   Patient has had 2 rounds of prednisone and antibiotics Patient feels better after the second round of antibiotics She feels better now Has used nystatin swish and swallow and seems to be helping She does not want to take Advair anymore as it bothers her at this point Albuterol as needed is what helps the most I have advised 2 to 4 puffs every 4 hours as needed Most likely diagnosis 2 bouts of viral infections    PFT's reviewed in detail with patient No significant obstructive patter was seen, probable small obstructive airways disease  CT chest 2018 CT of the chest independently reviewed in detail with the patient Patient has bilateral bronchiectasis     PAST MEDICAL HISTORY :   has a past medical history of Basal cell carcinoma (01/17/2018), Cystic teratoma of right ovary (11/23/2012), Granuloma annulare, Hyperlipidemia, Hypertension, Menopause, Plantar fasciitis of right foot (12/24/2014), Squamous cell carcinoma of skin (02/26/2020), and Squamous cell carcinoma of skin (07/15/2020).  has a past surgical history that includes Spine surgery (2010); Appendectomy; Venous ablation; Dilation and curettage of uterus; and Right oophorectomy (Right, feb  2014). Prior to Admission medications   Medication Sig Start Date End Date Taking? Authorizing Provider  albuterol (PROVENTIL HFA;VENTOLIN HFA) 108 (90 Base) MCG/ACT inhaler Inhale 1 puff into the lungs every 6 (six) hours as needed for wheezing or shortness of breath.   Yes [provider]  allopurinol (ZYLOPRIM) 100 MG tablet TAKE 1 TABLET BY MOUTH  DAILY 12/27/16  Yes Crecencio Mc, MD  Coenzyme Q10 (CO Q 10 PO) Take by mouth daily.   Yes [provider]  conjugated estrogens (PREMARIN) vaginal cream Place 1 Applicatorful vaginally 2 (two) times a week. 01/19/16  Yes Crecencio Mc, MD  ezetimibe (ZETIA) 10 MG tablet Take 1 tablet (10 mg total) by mouth daily. 03/22/17  Yes Gollan, Kathlene November, MD  fluticasone (FLONASE) 50 MCG/ACT nasal spray Place 2 sprays into both nostrils daily. 06/25/16  Yes Crecencio Mc, MD  HYDROcodone-acetaminophen (NORCO/VICODIN) 5-325 MG tablet Take 1 tablet every 6 (six) hours as needed by mouth. 01/18/17  Yes Harvest Dark, MD  losartan-hydrochlorothiazide (HYZAAR) 100-12.5 MG tablet Take 1 tablet by mouth daily. 06/06/17  Yes Crecencio Mc, MD  meloxicam (MOBIC) 15 MG tablet TAKE 1 TABLET BY MOUTH  DAILY 03/02/17  Yes Crecencio Mc, MD  metoprolol succinate (TOPROL-XL) 25 MG 24 hr tablet Take 1 tablet (25 mg total) by mouth daily. 06/07/17  Yes Crecencio Mc, MD  pantoprazole (PROTONIX) 40 MG tablet TAKE 1 TABLET BY MOUTH  DAILY 04/11/17  Yes Crecencio Mc, MD  sertraline (ZOLOFT) 100 MG tablet TAKE 1 TABLET BY MOUTH  EVERY DAY 02/21/17  Yes Crecencio Mc, MD  simvastatin (ZOCOR) 40  MG tablet TAKE 1 TABLET BY MOUTH AT  BEDTIME 05/11/17  Yes Crecencio Mc, MD   Allergies  Allergen Reactions   Biaxin [Clarithromycin]    Colloidal Oatmeal    Corn-Containing Products    Dust Mite Extract     Almonds,Mold Spores   Gluten Meal    Oat    Shrimp [Shellfish Allergy]     FAMILY HISTORY:  family history includes Cancer in her brother;  Early death in her mother; Heart attack in her father; Heart disease in her brother, father, and sister. SOCIAL HISTORY:  reports that she has never smoked. She has never used smokeless tobacco. She reports that she does not drink alcohol and does not use drugs.    Review of Systems:  Gen:  Denies  fever, sweats, chills weight loss  HEENT: Denies blurred vision, double vision, ear pain, eye pain, hearing loss, nose bleeds, sore throat Cardiac:  No dizziness, chest pain or heaviness, chest tightness,edema, No JVD Resp:   No cough, -sputum production, -shortness of breath,-wheezing, -hemoptysis,  Other:  All other systems negative BP 120/60 (BP Location: Left Arm, Patient Position: Sitting, Cuff Size: Large)   Pulse 87   Temp 98 F (36.7 C) (Oral)   Ht 5\' 4"  (1.626 m)   Wt 186 lb 3.2 oz (84.5 kg)   SpO2 97%   BMI 31.96 kg/m    Physical Examination:   General Appearance: No distress  EYES PERRLA, EOM intact.   NECK Supple, No JVD Pulmonary: normal breath sounds, No wheezing.  CardiovascularNormal S1,S2.  No m/r/g.   Abdomen: Benign, Soft, non-tender. Skin:   warm, no rashes, no ecchymosis  Extremities: normal, no cyanosis, clubbing. Neuro:without focal findings,  speech normal  PSYCHIATRIC: Mood, affect within normal limits.   ALL OTHER ROS ARE NEGATIVE    ASSESSMENT / PLAN:   85 year old pleasant white female seen today for follow-up intermittent shortness of breath and wheezing with significant history of secondhand smoke exposure and environmental exposure to upholstery with air intermittent signs of reactive airways disease most likely related to mild obstructive pulmonary disease with bronchiectasis with chronic bronchitis   Pulmonary function test does show some mild evidence of obstruction but no overt major obstructive airways disease in 2019  Continue albuterol as needed  Avoid allergens  Nystatin Continue to use albuterol 2 to 4 puffs every 4 hours as  needed Patient would like to hold Advair for now   MEDICATION ADJUSTMENTS/LABS AND TESTS ORDERED: Use albuterol 2 to 4 puffs every 4 hours as needed for cough and wheezing  Continue to use nystatin suspension as needed  Hold Advair for now  Please wear mask when able and when around people Avoid sick contacts Avoid secondhand smoke  avoid allergens   CURRENT MEDICATIONS REVIEWED AT LENGTH WITH PATIENT TODAY   Patient satisfied with Plan of action and management. All questions answered    Follow-up 6 months    Total time spent  31 minutes  Corrin Parker, M.D.  [facility]  Medical Director [place] Director [facility] Cardio-Pulmonary Department

## 2020-09-26 DIAGNOSIS — Z961 Presence of intraocular lens: Secondary | ICD-10-CM | POA: Diagnosis not present

## 2020-10-02 ENCOUNTER — Telehealth: Payer: Self-pay

## 2020-10-02 NOTE — Telephone Encounter (Signed)
Pt called into the office requesting her results from her 09/19/20 labs. Sent a message to [employer] as she is the provider tagged on the order. She states that the labs were normal. Called and spoke with Izora Gala. Shatema verbalized understanding and had no other questions.

## 2020-10-08 ENCOUNTER — Other Ambulatory Visit: Payer: Self-pay

## 2020-10-08 ENCOUNTER — Ambulatory Visit: Payer: Medicare Other | Admitting: Dermatology

## 2020-10-08 DIAGNOSIS — C44722 Squamous cell carcinoma of skin of right lower limb, including hip: Secondary | ICD-10-CM | POA: Diagnosis not present

## 2020-10-08 DIAGNOSIS — L578 Other skin changes due to chronic exposure to nonionizing radiation: Secondary | ICD-10-CM | POA: Diagnosis not present

## 2020-10-08 DIAGNOSIS — R21 Rash and other nonspecific skin eruption: Secondary | ICD-10-CM | POA: Diagnosis not present

## 2020-10-08 DIAGNOSIS — L299 Pruritus, unspecified: Secondary | ICD-10-CM

## 2020-10-08 DIAGNOSIS — C4492 Squamous cell carcinoma of skin, unspecified: Secondary | ICD-10-CM

## 2020-10-08 DIAGNOSIS — L720 Epidermal cyst: Secondary | ICD-10-CM

## 2020-10-08 DIAGNOSIS — D485 Neoplasm of uncertain behavior of skin: Secondary | ICD-10-CM

## 2020-10-08 DIAGNOSIS — Z85828 Personal history of other malignant neoplasm of skin: Secondary | ICD-10-CM | POA: Diagnosis not present

## 2020-10-08 HISTORY — DX: Squamous cell carcinoma of skin, unspecified: C44.92

## 2020-10-08 MED ORDER — CLOBETASOL PROPIONATE 0.05 % EX CREA: TOPICAL_CREAM | CUTANEOUS | 1 refills | Status: DC

## 2020-10-08 NOTE — Progress Notes (Signed)
Follow-Up Visit   Subjective  Jamie Anderson is a 85 y.o. female who presents for the following: Growth (R pretibia x several weeks, tender. Patient has a history of SCC of the right lower leg.).  She also has an itchy rash on her back and bump on her posterior neck that's itchy at times.  She has long h/o of GA on L thigh that flares off and on and gets itchy.   The following portions of the chart were reviewed this encounter and updated as appropriate:       Review of Systems:  No other skin or systemic complaints except as noted in HPI or Assessment and Plan.  Objective  Well appearing patient in no apparent distress; mood and affect are within normal limits.  A focused examination was performed including right lower leg. Relevant physical exam findings are noted in the Assessment and Plan.  Right mid lower pretibia 1.1 cm pink keratotic papule       L post lower neck 5.0 mm firm flesh nodule  Left Lower Back L mid lower back Light pink edematous plaque, no scale   Assessment & Plan  Actinic Damage - chronic, secondary to cumulative UV radiation exposure/sun exposure over time - diffuse scaly erythematous macules with underlying dyspigmentation - Recommend daily broad spectrum sunscreen SPF 30+ to sun-exposed areas, reapply every 2 hours as needed.  - Recommend staying in the shade or wearing long sleeves, sun glasses (UVA+UVB protection) and wide brim hats (4-inch brim around the entire circumference of the hat). - Call for new or changing lesions.  History of Squamous Cell Carcinoma of the Skin - No evidence of recurrence today - No lymphadenopathy - Recommend regular full body skin exams - Recommend daily broad spectrum sunscreen SPF 30+ to sun-exposed areas, reapply every 2 hours as needed.  - Call if any new or changing lesions are noted between office visits     Neoplasm of uncertain behavior of skin Right mid lower pretibia  Skin / nail biopsy Type  of biopsy: tangential   Informed consent: discussed and consent obtained   Patient was prepped and draped in usual sterile fashion: Area prepped with alcohol. Anesthesia: the lesion was anesthetized in a standard fashion   Anesthetic:  1% lidocaine w/ epinephrine 1-100,000 buffered w/ 8.4% NaHCO3 Instrument used: flexible razor blade   Hemostasis achieved with: pressure, aluminum chloride and electrodesiccation   Outcome: patient tolerated procedure well    Destruction of lesion  Destruction method: electrodesiccation and curettage   Informed consent: discussed and consent obtained   Timeout:  patient name, date of birth, surgical site, and procedure verified Curettage performed in three different directions: Yes   Electrodesiccation performed over the curetted area: Yes   Lesion length (cm):  1.1 Lesion width (cm):  1.1 Margin per side (cm):  0 Final wound size (cm):  1.1 Hemostasis achieved with:  pressure, aluminum chloride and electrodesiccation Outcome: patient tolerated procedure well with no complications   Post-procedure details: wound care instructions given   Post-procedure details comment:  Ointment and bandage applied.  Specimen 1 - Surgical pathology Differential Diagnosis: Hypertrophic AK r/o SCC Check Margins: No 1.1 cm pink keratotic papule EDC today  Epidermal inclusion cyst L post lower neck  With pruritus  Sample of Halog cream given, apply qd/bid prn itch.  Recheck on f/up  Benign-appearing. Exam most consistent with an epidermal inclusion cyst. Discussed that a cyst is a benign growth that can grow over time and  sometimes get irritated or inflamed. Recommend observation if it is not bothersome. Discussed option of surgical excision to remove it if it is growing, symptomatic, or other changes noted. Please call for new or changing lesions so they can be evaluated.    Rash Left Lower Back  Probable Granuloma Annulare (pt has had similar appearing  bx-proven GA on L thigh)  Start Clobetasol Cream qd/bid prn itch. Avoid face, groin, axilla.   Topical steroids (such as triamcinolone, fluocinolone, fluocinonide, mometasone, clobetasol, halobetasol, betamethasone, hydrocortisone) can cause thinning and lightening of the skin if they are used for too long in the same area. Your physician has selected the right strength medicine for your problem and area affected on the body. Please use your medication only as directed by your physician to prevent side effects.    Return as scheduled.  IJamesetta Orleans, CMA, am acting as scribe for Brendolyn Patty, MD . Documentation: I have reviewed the above documentation for accuracy and completeness, and I agree with the above.  Brendolyn Patty MD

## 2020-10-08 NOTE — Patient Instructions (Addendum)
Wound Care Instructions  Cleanse wound gently with soap and water once a day then pat dry with clean gauze. Apply a thing coat of Petrolatum (petroleum jelly, "Vaseline") over the wound (unless you have an allergy to this). We recommend that you use a new, sterile tube of Vaseline. Do not pick or remove scabs. Do not remove the yellow or white "healing tissue" from the base of the wound.  Cover the wound with fresh, clean, nonstick gauze and secure with paper tape. You may use Band-Aids in place of gauze and tape if the would is small enough, but would recommend trimming much of the tape off as there is often too much. Sometimes Band-Aids can irritate the skin.  You should call the office for your biopsy report after 1 week if you have not already been contacted.  If you experience any problems, such as abnormal amounts of bleeding, swelling, significant bruising, significant pain, or evidence of infection, please call the office immediately.  FOR ADULT SURGERY PATIENTS: If you need something for pain relief you may take 1 extra strength Tylenol (acetaminophen) AND 2 Ibuprofen (200mg each) together every 4 hours as needed for pain. (do not take these if you are allergic to them or if you have a reason you should not take them.) Typically, you may only need pain medication for 1 to 3 days.   If you have any questions or concerns for your doctor, please call our main line at [phone] and press option 4 to reach your doctor's medical assistant. If no one answers, please leave a voicemail as directed and we will return your call as soon as possible. Messages left after 4 pm will be answered the following business day.   You may also send us a message via MyChart. We typically respond to MyChart messages within 1-2 business days.  For prescription refills, please ask your pharmacy to contact our office. Our fax number is [phone].  If you have an urgent issue when the clinic is closed that  cannot wait until the next business day, you can page your doctor at the number below.    Please note that while we do our best to be available for urgent issues outside of office hours, we are not available 24/7.   If you have an urgent issue and are unable to reach us, you may choose to seek medical care at your doctor's office, retail clinic, urgent care center, or emergency room.  If you have a medical emergency, please immediately call 911 or go to the emergency department.  Pager Numbers  - Dr. Kowalski: [phone]  - Dr. Moye: [phone]  - Dr. Stewart: [phone]  In the event of inclement weather, please call our main line at [phone] for an update on the status of any delays or closures.  Dermatology Medication Tips: Please keep the boxes that topical medications come in in order to help keep track of the instructions about where and how to use these. Pharmacies typically print the medication instructions only on the boxes and not directly on the medication tubes.   If your medication is too expensive, please contact our office at [phone] option 4 or send us a message through MyChart.   We are unable to tell what your co-pay for medications will be in advance as this is different depending on your insurance coverage. However, we may be able to find a substitute medication at lower cost or fill out paperwork to get insurance to cover a needed   medication.   If a prior authorization is required to get your medication covered by your insurance company, please allow us 1-2 business days to complete this process.  Drug prices often vary depending on where the prescription is filled and some pharmacies may offer cheaper prices.  The website www.goodrx.com contains coupons for medications through different pharmacies. The prices here do not account for what the cost may be with help from insurance (it may be cheaper with your insurance), but the website can give you the  price if you did not use any insurance.  - You can print the associated coupon and take it with your prescription to the pharmacy.  - You may also stop by our office during regular business hours and pick up a GoodRx coupon card.  - If you need your prescription sent electronically to a different pharmacy, notify our office through [facility] MyChart or by phone at [phone] option 4.   

## 2020-10-17 ENCOUNTER — Encounter: Payer: Self-pay | Admitting: Internal Medicine

## 2020-10-20 ENCOUNTER — Telehealth: Payer: Self-pay

## 2020-10-20 NOTE — Telephone Encounter (Signed)
-----   Message from Brendolyn Patty, MD sent at 10/20/2020  8:39 AM EDT ----- Skin , right mid lower pretibia WELL DIFFERENTIATED SQUAMOUS CELL CARCINOMA, BASE INVOLVED   SCC skin cancer- already treated with EDC at time of biopsy  - please call patient

## 2020-10-20 NOTE — Telephone Encounter (Signed)
Advised pt of bx results/sh ?

## 2020-11-04 ENCOUNTER — Other Ambulatory Visit (INDEPENDENT_AMBULATORY_CARE_PROVIDER_SITE_OTHER): Payer: Medicare Other

## 2020-11-04 ENCOUNTER — Other Ambulatory Visit: Payer: Self-pay

## 2020-11-04 DIAGNOSIS — N1831 Chronic kidney disease, stage 3a: Secondary | ICD-10-CM | POA: Diagnosis not present

## 2020-11-04 LAB — BASIC METABOLIC PANEL
BUN: 22 mg/dL (ref 6–23)
CO2: 27 mEq/L (ref 19–32)
Calcium: 9.4 mg/dL (ref 8.4–10.5)
Chloride: 105 mEq/L (ref 96–112)
Creatinine, Ser: 1.08 mg/dL (ref 0.40–1.20)
GFR: 47.66 mL/min — ABNORMAL LOW (ref >60.00)
Glucose, Bld: 88 mg/dL (ref 70–99)
Potassium: 4.5 mEq/L (ref 3.5–5.1)
Sodium: 140 mEq/L (ref 135–145)

## 2020-11-05 ENCOUNTER — Other Ambulatory Visit: Payer: Self-pay | Admitting: Internal Medicine

## 2020-11-06 ENCOUNTER — Other Ambulatory Visit: Payer: Self-pay | Admitting: Internal Medicine

## 2020-11-06 DIAGNOSIS — M170 Bilateral primary osteoarthritis of knee: Secondary | ICD-10-CM | POA: Diagnosis not present

## 2020-11-06 DIAGNOSIS — N1831 Chronic kidney disease, stage 3a: Secondary | ICD-10-CM

## 2020-11-06 NOTE — Progress Notes (Signed)
Nephrology referral in process.  SHE HAS A REFILL ON THE TELMISARTAN   please reiterate that the losartan and hct  were stopped !!!!!

## 2020-11-07 ENCOUNTER — Telehealth: Payer: Self-pay | Admitting: Internal Medicine

## 2020-11-07 NOTE — Telephone Encounter (Signed)
Baron Hamper, CMA  11/07/2020  3:09 PM EDT Back to Top    Pt was informed to stop taking the losartan and hctz and only take the Telmisartan.  Patient calling back in and wondering if she should continue the metoprolol. Informed the Patient that there were no instructions to stop this medication.    Patient inquiring about kidney specialist referral. Given the information for Dr [facility] office.

## 2020-12-08 ENCOUNTER — Other Ambulatory Visit: Payer: Self-pay

## 2020-12-08 ENCOUNTER — Ambulatory Visit
Admission: RE | Admit: 2020-12-08 | Discharge: 2020-12-08 | Disposition: A | Discharge status: Home / Self Care | Payer: Medicare Other | Source: Ambulatory Visit | Attending: Internal Medicine | Admitting: Internal Medicine

## 2020-12-08 DIAGNOSIS — Z1231 Encounter for screening mammogram for malignant neoplasm of breast: Secondary | ICD-10-CM | POA: Insufficient documentation

## 2020-12-09 ENCOUNTER — Ambulatory Visit: Payer: Medicare Other | Admitting: Dermatology

## 2020-12-09 DIAGNOSIS — L570 Actinic keratosis: Secondary | ICD-10-CM | POA: Diagnosis not present

## 2020-12-09 DIAGNOSIS — L578 Other skin changes due to chronic exposure to nonionizing radiation: Secondary | ICD-10-CM

## 2020-12-09 DIAGNOSIS — D485 Neoplasm of uncertain behavior of skin: Secondary | ICD-10-CM

## 2020-12-09 DIAGNOSIS — Z85828 Personal history of other malignant neoplasm of skin: Secondary | ICD-10-CM

## 2020-12-09 DIAGNOSIS — D0471 Carcinoma in situ of skin of right lower limb, including hip: Secondary | ICD-10-CM | POA: Diagnosis not present

## 2020-12-09 NOTE — Progress Notes (Signed)
Follow-Up Visit   Subjective  Jamie Anderson is a 85 y.o. female who presents for the following: Follow-up (Patient here for follow-up hx of Aks and history of SCCs of the R mid lower pretibia, R mid pretibia, and R lower pretibia. She has a few spots on her lower legs that are raised and she hits with shaving. ). One stays sore and tender.   The following portions of the chart were reviewed this encounter and updated as appropriate:       Review of Systems:  No other skin or systemic complaints except as noted in HPI or Assessment and Plan.  Objective  Well appearing patient in no apparent distress; mood and affect are within normal limits.  A focused examination was performed including face, arms, legs. Relevant physical exam findings are noted in the Assessment and Plan.  R lateral pretibia 1.0cm pink scaly papule     R lower pretibia, R mid pretibia, R lower pretibia Well healed scar with no evidence of recurrence.   R upper pretibia, Left shoulder x 2, Left post upper arm x 1 (4) Keratotic papule.   Assessment & Plan  Actinic Damage - chronic, secondary to cumulative UV radiation exposure/sun exposure over time - diffuse scaly erythematous macules with underlying dyspigmentation - Recommend daily broad spectrum sunscreen SPF 30+ to sun-exposed areas, reapply every 2 hours as needed.  - Recommend staying in the shade or wearing long sleeves, sun glasses (UVA+UVB protection) and wide brim hats (4-inch brim around the entire circumference of the hat). - Call for new or changing lesions.  Neoplasm of uncertain behavior of skin R lateral pretibia  Skin / nail biopsy Type of biopsy: tangential   Informed consent: discussed and consent obtained   Patient was prepped and draped in usual sterile fashion: Area prepped with alcohol. Anesthesia: the lesion was anesthetized in a standard fashion   Anesthetic:  1% lidocaine w/ epinephrine 1-100,000 buffered w/ 8.4%  NaHCO3 Instrument used: flexible razor blade   Hemostasis achieved with: pressure, aluminum chloride and electrodesiccation   Outcome: patient tolerated procedure well    Destruction of lesion  Destruction method: electrodesiccation and curettage   Informed consent: discussed and consent obtained   Timeout:  patient name, date of birth, surgical site, and procedure verified Curettage performed in three different directions: Yes   Electrodesiccation performed over the curetted area: Yes   Final wound size (cm):  1.1 Hemostasis achieved with:  pressure, aluminum chloride and electrodesiccation Outcome: patient tolerated procedure well with no complications   Post-procedure details: wound care instructions given   Post-procedure details comment:  Ointment and bandage applied.  Specimen 1 - Surgical pathology Differential Diagnosis: Hypertrophic AK vs Inflamed SK r/o SCC Check Margins: No 1.0cm pink scaly papule EDC today  History of SCC (squamous cell carcinoma) of skin R lower pretibia, R mid pretibia, R lower pretibia  Clear. Observe for recurrence. Call clinic for new or changing lesions.  Recommend regular skin exams, daily broad-spectrum spf 30+ sunscreen use, and photoprotection.    Hypertrophic actinic keratosis (4) R upper pretibia, Left shoulder x 2, Left post upper arm x 1  Actinic keratoses are precancerous spots that appear secondary to cumulative UV radiation exposure/sun exposure over time. They are chronic with expected duration over 1 year. A portion of actinic keratoses will progress to squamous cell carcinoma of the skin. It is not possible to reliably predict which spots will progress to skin cancer and so treatment is recommended to  prevent development of skin cancer.  Recommend daily broad spectrum sunscreen SPF 30+ to sun-exposed areas, reapply every 2 hours as needed.  Recommend staying in the shade or wearing long sleeves, sun glasses (UVA+UVB protection) and  wide brim hats (4-inch brim around the entire circumference of the hat). Call for new or changing lesions.  Destruction of lesion - R upper pretibia, Left shoulder x 2, Left post upper arm x 1  Destruction method: cryotherapy   Informed consent: discussed and consent obtained   Lesion destroyed using liquid nitrogen: Yes   Region frozen until ice ball extended beyond lesion: Yes   Outcome: patient tolerated procedure well with no complications   Post-procedure details: wound care instructions given   Additional details:  Prior to procedure, discussed risks of blister formation, small wound, skin dyspigmentation, or rare scar following cryotherapy. Recommend Vaseline ointment to treated areas while healing.   Return in about 3 months (around 03/11/2021) for f/u bx, hx SCCs.  IJamesetta Orleans, CMA, am acting as scribe for Brendolyn Patty, MD .  Documentation: I have reviewed the above documentation for accuracy and completeness, and I agree with the above.  Brendolyn Patty MD

## 2020-12-09 NOTE — Patient Instructions (Addendum)
Cryotherapy Aftercare  Wash gently with soap and water everyday.   Apply Vaseline and Band-Aid daily until healed.    Wound Care Instructions  Cleanse wound gently with soap and water once a day then pat dry with clean gauze. Apply a thing coat of Petrolatum (petroleum jelly, "Vaseline") over the wound (unless you have an allergy to this). We recommend that you use a new, sterile tube of Vaseline. Do not pick or remove scabs. Do not remove the yellow or white "healing tissue" from the base of the wound.  Cover the wound with fresh, clean, nonstick gauze and secure with paper tape. You may use Band-Aids in place of gauze and tape if the would is small enough, but would recommend trimming much of the tape off as there is often too much. Sometimes Band-Aids can irritate the skin.  You should call the office for your biopsy report after 1 week if you have not already been contacted.  If you experience any problems, such as abnormal amounts of bleeding, swelling, significant bruising, significant pain, or evidence of infection, please call the office immediately.  FOR ADULT SURGERY PATIENTS: If you need something for pain relief you may take 1 extra strength Tylenol (acetaminophen) AND 2 Ibuprofen (200mg each) together every 4 hours as needed for pain. (do not take these if you are allergic to them or if you have a reason you should not take them.) Typically, you may only need pain medication for 1 to 3 days.     If you have any questions or concerns for your doctor, please call our main line at [phone] and press option 4 to reach your doctor's medical assistant. If no one answers, please leave a voicemail as directed and we will return your call as soon as possible. Messages left after 4 pm will be answered the following business day.   You may also send us a message via MyChart. We typically respond to MyChart messages within 1-2 business days.  For prescription refills, please ask your  pharmacy to contact our office. Our fax number is [phone].  If you have an urgent issue when the clinic is closed that cannot wait until the next business day, you can page your doctor at the number below.    Please note that while we do our best to be available for urgent issues outside of office hours, we are not available 24/7.   If you have an urgent issue and are unable to reach us, you may choose to seek medical care at your doctor's office, retail clinic, urgent care center, or emergency room.  If you have a medical emergency, please immediately call 911 or go to the emergency department.  Pager Numbers  - Dr. Kowalski: [phone]  - Dr. Moye: [phone]  - Dr. Stewart: [phone]  In the event of inclement weather, please call our main line at [phone] for an update on the status of any delays or closures.  Dermatology Medication Tips: Please keep the boxes that topical medications come in in order to help keep track of the instructions about where and how to use these. Pharmacies typically print the medication instructions only on the boxes and not directly on the medication tubes.   If your medication is too expensive, please contact our office at [phone] option 4 or send us a message through MyChart.   We are unable to tell what your co-pay for medications will be in advance as this is different depending on your insurance coverage.   However, we may be able to find a substitute medication at lower cost or fill out paperwork to get insurance to cover a needed medication.   If a prior authorization is required to get your medication covered by your insurance company, please allow us 1-2 business days to complete this process.  Drug prices often vary depending on where the prescription is filled and some pharmacies may offer cheaper prices.  The website www.goodrx.com contains coupons for medications through different pharmacies. The prices here do not  account for what the cost may be with help from insurance (it may be cheaper with your insurance), but the website can give you the price if you did not use any insurance.  - You can print the associated coupon and take it with your prescription to the pharmacy.  - You may also stop by our office during regular business hours and pick up a GoodRx coupon card.  - If you need your prescription sent electronically to a different pharmacy, notify our office through [facility] MyChart or by phone at [phone] option 4.  

## 2020-12-11 ENCOUNTER — Other Ambulatory Visit: Payer: Self-pay | Admitting: Nephrology

## 2020-12-11 ENCOUNTER — Other Ambulatory Visit (HOSPITAL_BASED_OUTPATIENT_CLINIC_OR_DEPARTMENT_OTHER): Payer: Self-pay | Admitting: Nephrology

## 2020-12-11 DIAGNOSIS — I10 Essential (primary) hypertension: Secondary | ICD-10-CM | POA: Diagnosis not present

## 2020-12-11 DIAGNOSIS — N1831 Chronic kidney disease, stage 3a: Secondary | ICD-10-CM

## 2020-12-15 ENCOUNTER — Telehealth: Payer: Self-pay

## 2020-12-15 NOTE — Telephone Encounter (Signed)
-----   Message from Brendolyn Patty, MD sent at 12/15/2020  1:00 PM EDT ----- Skin , right lateral pretibial SQUAMOUS CELL CARCINOMA IN SITU, ASSOCIATED WITH VERRUCA, BASE INVOLVED  SCCIS skin cancer- already treated with EDC at time of biopsy   - please call patient

## 2020-12-24 ENCOUNTER — Ambulatory Visit
Admission: RE | Admit: 2020-12-24 | Discharge: 2020-12-24 | Disposition: A | Discharge status: Home / Self Care | Payer: Medicare Other | Source: Ambulatory Visit | Attending: Nephrology | Admitting: Nephrology

## 2020-12-24 ENCOUNTER — Other Ambulatory Visit: Payer: Self-pay

## 2020-12-24 DIAGNOSIS — N1831 Chronic kidney disease, stage 3a: Secondary | ICD-10-CM | POA: Insufficient documentation

## 2020-12-24 DIAGNOSIS — N281 Cyst of kidney, acquired: Secondary | ICD-10-CM | POA: Diagnosis not present

## 2021-01-04 NOTE — Progress Notes (Signed)
Cardiology Office Note  Date:  01/05/2021   ID:  [name], DOB Dec 30, 1935, MRN [id number]  PCP:  Crecencio Mc, MD   Chief Complaint  Patient presents with   12 month follow up     "Doing well." Medications reviewed by the patient verbally.     HPI:  [name] is a pleasant 85 year old woman with past medical history of Hypertension Hyperlipidemia Chronic back pain, hx of back surgery Brother with CABG, CVA Sister with "open heart surg" Aortic atherosclerosis, coronary calcification on CT Who presents for follow-up of her chest pain   LOV 11/2019  Weight trending up Followed by nephrology  No exercise Legs weaker Takes care of several animals  Chronic back pain Had cortisone shots Wonders if she might be a candidate for back surgery Mild gait instability Knee is also giving her a difficult time, receiving cortisone injections  Lab work reviewed Total chol 163, LDL 68 On statin with Zetia   nonsmoker nondiabetetic  EKG personally reviewed by myself on todays visit Shows normal sinus rhythm rate 71 bpm no significant ST or T wave changes   PMH:   has a past medical history of Basal cell carcinoma (01/17/2018), Cystic teratoma of right ovary (11/23/2012), Granuloma annulare, Hyperlipidemia, Hypertension, Menopause, Plantar fasciitis of right foot (12/24/2014), SCC (squamous cell carcinoma) (10/08/2020), Squamous cell carcinoma of skin (02/26/2020), Squamous cell carcinoma of skin (07/15/2020), and Squamous cell carcinoma of skin (12/09/2020).  PSH:    Past Surgical History:  Procedure Laterality Date   APPENDECTOMY     DILATION AND CURETTAGE OF UTERUS     RIGHT OOPHORECTOMY Right feb 2014   [name]   SPINE SURGERY  2010   VENOUS ABLATION     bilateral    Current Outpatient Medications  Medication Sig Dispense Refill   albuterol (VENTOLIN HFA) 108 (90 Base) MCG/ACT inhaler Inhale 1 puff into the lungs every 6 (six) hours as needed for wheezing  or shortness of breath. 1 each 0   allopurinol (ZYLOPRIM) 100 MG tablet TAKE 1 TABLET BY MOUTH  DAILY 90 tablet 3   aspirin EC 81 MG tablet Take 1 tablet (81 mg total) by mouth daily. 90 tablet 3   clobetasol cream (TEMOVATE) 0.05 % Apply to itchy rash on back 1-2 times a day until improved. Avoid face, groin, axilla. 30 g 1   Coenzyme Q10 (CO Q 10 PO) Take by mouth daily.     ezetimibe (ZETIA) 10 MG tablet Take 1 tablet (10 mg total) by mouth daily. 90 tablet 1   fluticasone (FLONASE) 50 MCG/ACT nasal spray Place 2 sprays into both nostrils daily. 48 g 1   metoprolol succinate (TOPROL-XL) 25 MG 24 hr tablet TAKE 1 TABLET BY MOUTH  DAILY 90 tablet 3   nystatin (MYCOSTATIN) 100000 UNIT/ML suspension Take 5 mLs (500,000 Units total) by mouth 4 (four) times daily. 60 mL 5   pantoprazole (PROTONIX) 40 MG tablet Take 1 tablet (40 mg total) by mouth daily. 90 tablet 1   PREMARIN vaginal cream Place 1 Applicatorful vaginally 2 (two) times a week. 30 g 0   sertraline (ZOLOFT) 100 MG tablet TAKE 1 TABLET BY MOUTH  DAILY 90 tablet 3   simvastatin (ZOCOR) 40 MG tablet TAKE 1 TABLET BY MOUTH AT  BEDTIME 90 tablet 3   telmisartan (MICARDIS) 80 MG tablet Take 1 tablet (80 mg total) by mouth daily. 90 tablet 1   traMADol (ULTRAM) 50 MG tablet Take 1 tablet (50  mg total) by mouth every 12 (twelve) hours as needed for moderate pain. 60 tablet 5   fluticasone-salmeterol (ADVAIR) 100-50 MCG/ACT AEPB Inhale 1 puff into the lungs 2 (two) times daily. Rinse mouth after each use. (Patient not taking: No sig reported) 1 each 3   No current facility-administered medications for this visit.     Allergies:   Bran, Clarithromycin, Colloidal oatmeal, Corn-containing products, Dust mite extract, Gluten meal, Oat, Other, and Shrimp [shellfish allergy]   Social History:  The patient  reports that she has never smoked. She has never used smokeless tobacco. She reports that she does not drink alcohol and does not use drugs.    Family History:   family history includes Breast cancer (age of onset: 73) in her sister; Cancer in her brother; Early death in her mother; Heart attack in her father; Heart disease in her brother, father, and sister.    Review of Systems: Review of Systems  Constitutional: Negative.   Eyes: Negative.   Respiratory: Negative.    Cardiovascular: Negative.   Gastrointestinal: Negative.   Genitourinary: Negative.   Musculoskeletal:  Positive for back pain and neck pain.  Neurological: Negative.   Psychiatric/Behavioral: Negative.    All other systems reviewed and are negative.  PHYSICAL EXAM: VS:  BP 140/68 (BP Location: Left Arm, Patient Position: Sitting, Cuff Size: Normal)   Pulse 71   Ht 5\' 4"  (1.626 m)   Wt 191 lb 4 oz (86.8 kg)   SpO2 98%   BMI 32.83 kg/m  , BMI Body mass index is 32.83 kg/m. Constitutional:  oriented to person, place, and time. No distress.  HENT:  Head: Grossly normal Eyes:  no discharge. No scleral icterus.  Neck: No JVD, no carotid bruits  Cardiovascular: Regular rate and rhythm, no murmurs appreciated Pulmonary/Chest: Clear to auscultation bilaterally, no wheezes or rales Abdominal: Soft.  no distension.  no tenderness.  Musculoskeletal: Normal range of motion Neurological:  normal muscle tone. Coordination normal. No atrophy Skin: Skin warm and dry Psychiatric: normal affect, pleasant  Recent Labs: 07/29/2020: TSH 1.55 08/18/2020: Hemoglobin 12.4; Platelets 189 09/19/2020: ALT 16 11/04/2020: BUN 22; Creatinine, Ser 1.08; Potassium 4.5; Sodium 140    Lipid Panel Lab Results  Component Value Date   CHOL 166 07/29/2020   HDL 72.80 07/29/2020   LDLCALC 69 07/29/2020   TRIG 122.0 07/29/2020    Wt Readings from Last 3 Encounters:  01/05/21 191 lb 4 oz (86.8 kg)  09/22/20 186 lb 3.2 oz (84.5 kg)  08/26/20 185 lb (83.9 kg)     ASSESSMENT AND PLAN:  Atypical chest pain  No chest pain sx No further workup at this time  Aortic  atherosclerosis (HCC)  Moderate aortic atherosclerosis Cholesterol at goal  Essential hypertension Blood pressure is well controlled on today's visit. No changes made to the medications.  Hyperlipidemia Continue statin with Zetia  Shortness of breath We have encouraged continued exercise, careful diet management in an effort to lose weight.  Total encounter time more than 25 minutes Greater than 50% was spent in counseling and coordination of care with the patient   Signed, Esmond Plants, M.D., Ph.D. 01/05/2021  [place], [place] [phone]

## 2021-01-05 ENCOUNTER — Encounter: Payer: Self-pay | Admitting: Cardiovascular Disease

## 2021-01-05 ENCOUNTER — Ambulatory Visit: Payer: Medicare Other | Admitting: Cardiovascular Disease

## 2021-01-05 ENCOUNTER — Other Ambulatory Visit: Payer: Self-pay

## 2021-01-05 VITALS — BP 140/68 | HR 71 | Ht 64.0 in | Wt 191.2 lb

## 2021-01-05 DIAGNOSIS — E785 Hyperlipidemia, unspecified: Secondary | ICD-10-CM

## 2021-01-05 DIAGNOSIS — I700 Atherosclerosis of aorta: Secondary | ICD-10-CM

## 2021-01-05 DIAGNOSIS — I10 Essential (primary) hypertension: Secondary | ICD-10-CM

## 2021-01-05 NOTE — Patient Instructions (Addendum)
Medication Instructions:  No changes  If you need a refill on your cardiac medications before your next appointment, please call your pharmacy.   Lab work: No new labs needed  Testing/Procedures: No new testing needed  Follow-Up: At [facility], you and your health needs are our priority.  As part of our continuing mission to provide you with exceptional heart care, we have created designated Provider Care Teams.  These Care Teams include your primary Cardiologist (physician) and Advanced Practice Providers (APPs -  Physician Assistants and Nurse Practitioners) who all work together to provide you with the care you need, when you need it.  You will need a follow up appointment in 12 months  Providers on your designated Care Team:   Christopher Berge, NP Ryan Dunn, PA-C Cadence Furth, PA-C  COVID-19 Vaccine Information can be found at: [email or web address] For questions related to vaccine distribution or appointments, please email [email or web address] or call [phone].   

## 2021-01-19 DIAGNOSIS — D631 Anemia in chronic kidney disease: Secondary | ICD-10-CM | POA: Diagnosis not present

## 2021-01-19 DIAGNOSIS — I10 Essential (primary) hypertension: Secondary | ICD-10-CM | POA: Diagnosis not present

## 2021-01-19 DIAGNOSIS — N1832 Chronic kidney disease, stage 3b: Secondary | ICD-10-CM | POA: Diagnosis not present

## 2021-01-19 DIAGNOSIS — N2581 Secondary hyperparathyroidism of renal origin: Secondary | ICD-10-CM | POA: Diagnosis not present

## 2021-01-23 ENCOUNTER — Other Ambulatory Visit: Payer: Self-pay | Admitting: Internal Medicine

## 2021-01-23 DIAGNOSIS — E785 Hyperlipidemia, unspecified: Secondary | ICD-10-CM

## 2021-01-30 ENCOUNTER — Other Ambulatory Visit: Payer: Self-pay | Admitting: Internal Medicine

## 2021-02-02 ENCOUNTER — Encounter: Payer: Self-pay | Admitting: Internal Medicine

## 2021-02-02 ENCOUNTER — Other Ambulatory Visit: Payer: Self-pay

## 2021-02-02 ENCOUNTER — Ambulatory Visit (INDEPENDENT_AMBULATORY_CARE_PROVIDER_SITE_OTHER): Payer: Medicare Other | Admitting: Internal Medicine

## 2021-02-02 VITALS — BP 138/80 | HR 69 | Temp 97.2°F | Ht 64.0 in | Wt 191.2 lb

## 2021-02-02 DIAGNOSIS — M48061 Spinal stenosis, lumbar region without neurogenic claudication: Secondary | ICD-10-CM | POA: Diagnosis not present

## 2021-02-02 DIAGNOSIS — E785 Hyperlipidemia, unspecified: Secondary | ICD-10-CM | POA: Diagnosis not present

## 2021-02-02 DIAGNOSIS — F422 Mixed obsessional thoughts and acts: Secondary | ICD-10-CM

## 2021-02-02 DIAGNOSIS — I10 Essential (primary) hypertension: Secondary | ICD-10-CM

## 2021-02-02 DIAGNOSIS — N1831 Chronic kidney disease, stage 3a: Secondary | ICD-10-CM

## 2021-02-02 DIAGNOSIS — M1711 Unilateral primary osteoarthritis, right knee: Secondary | ICD-10-CM

## 2021-02-02 MED ORDER — SIMVASTATIN 40 MG PO TABS: 40.0000 mg | ORAL_TABLET | Freq: Every day | ORAL | 3 refills | Status: DC

## 2021-02-02 MED ORDER — TELMISARTAN 80 MG PO TABS: 80.0000 mg | ORAL_TABLET | Freq: Every day | ORAL | 0 refills | Status: DC

## 2021-02-02 NOTE — Patient Instructions (Addendum)
I agree with  drinking  40 ounces of water daily  to keep your kidneys happy   Flavor your water if you have to !   I agree with Dr Rockey Situ:  get your knee replacement now,  don't waste time with the "filler"  Treat your pain!    You can add up to 2000 mg of acetominophen (tylenol) every day safely  In divided doses (500 mg every 6 hours  Or 1000 mg every 12 hours.)   add a daytime dose of  tramadol if needed  along with one for night time use
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
## 2021-02-03 ENCOUNTER — Encounter: Payer: Self-pay | Admitting: Internal Medicine

## 2021-02-03 LAB — COMPREHENSIVE METABOLIC PANEL
ALT: 12 U/L (ref 0–35)
AST: 15 U/L (ref 0–37)
Albumin: 4.2 g/dL (ref 3.5–5.2)
Alkaline Phosphatase: 107 U/L (ref 39–117)
BUN: 17 mg/dL (ref 6–23)
CO2: 28 mEq/L (ref 19–32)
Calcium: 9.6 mg/dL (ref 8.4–10.5)
Chloride: 106 mEq/L (ref 96–112)
Creatinine, Ser: 1.12 mg/dL (ref 0.40–1.20)
GFR: 45.55 mL/min — ABNORMAL LOW (ref >60.00)
Glucose, Bld: 94 mg/dL (ref 70–99)
Potassium: 4.2 mEq/L (ref 3.5–5.1)
Sodium: 141 mEq/L (ref 135–145)
Total Bilirubin: 0.4 mg/dL (ref 0.2–1.2)
Total Protein: 6.2 g/dL (ref 6.0–8.3)

## 2021-02-03 NOTE — Assessment & Plan Note (Signed)
GFR improved with avoidance of NSAIDs but still < 60 ml/min. continue metoprolol and CHANGE ARB TO TELMISARTAN 80 MG,  Seeing nephrology semi annually  Lab Results  Component Value Date   CREATININE 1.08 11/04/2020

## 2021-02-03 NOTE — Assessment & Plan Note (Signed)
Managed with Zetia and Zocor. LDL is at goal < 70 and LFTs normal  Lab Results  Component Value Date   CHOL 166 07/29/2020   HDL 72.80 07/29/2020   LDLCALC 69 07/29/2020   LDLDIRECT 59.0 01/30/2018   TRIG 122.0 07/29/2020   CHOLHDL 2 07/29/2020   Lab Results  Component Value Date   ALT 16 09/19/2020   AST 17 09/19/2020   ALKPHOS 100 09/19/2020   BILITOT 0.4 09/19/2020

## 2021-02-03 NOTE — Assessment & Plan Note (Signed)
Advanced,  With prior laminectomies at L3,4,5 levels.  She has severe foraminal stenosis at L3-4 on the left that is no longer managed with ESI per patient preference and lack of perceived effect.  Using tramadol qhs and bid  tylenol.  Using occasional prednisone/tramadol  Courses  For exacerbations.  Jamie Anderson

## 2021-02-03 NOTE — Assessment & Plan Note (Signed)
Well controlled on current regimen  Of metoprolol and telmisartan . Renal function stable, no changes today.

## 2021-02-03 NOTE — Assessment & Plan Note (Signed)
Reviewed the literature regarding use of Synvisc vs TKR.  Advised patient that the viscous injections are temporary measures and that the longer she waits,  The less likely of a candidate she will be for TKR given her aging and sedentary lifestyle.

## 2021-02-03 NOTE — Assessment & Plan Note (Signed)
Continue sertraline for management of anxiety

## 2021-02-05 ENCOUNTER — Telehealth: Payer: Self-pay | Admitting: Internal Medicine

## 2021-02-05 DIAGNOSIS — M542 Cervicalgia: Secondary | ICD-10-CM

## 2021-02-05 DIAGNOSIS — M5030 Other cervical disc degeneration, unspecified cervical region: Secondary | ICD-10-CM

## 2021-02-05 MED ORDER — TRAMADOL HCL 50 MG PO TABS: 50.0000 mg | ORAL_TABLET | Freq: Two times a day (BID) | ORAL | 5 refills | Status: DC | PRN

## 2021-02-05 NOTE — Telephone Encounter (Signed)
Refill on traMADol (ULTRAM) 50 MG tablet. Was not sent to [facility]. The patient is out of her medication . She stated it should have been filled on Monday.

## 2021-02-05 NOTE — Telephone Encounter (Signed)
RX Refill: tramadol Last Seen: 02-02-21 Last Ordered: 11-19-20 Next Appt: 08-04-21

## 2021-02-10 ENCOUNTER — Telehealth: Payer: Self-pay | Admitting: Internal Medicine

## 2021-02-10 NOTE — Telephone Encounter (Signed)
Pt calling in regards to labs she had done on 11/28. Pt has not been advised with results

## 2021-02-10 NOTE — Telephone Encounter (Signed)
Spoke with pt and informed her of her lab results. Pt stated that yesterday her "bones started hurting" and she had to use her inhaler. Pt stated that she does not have a fever, no congestion, no cough or any other symptoms. I let pt know that body aches is a symptom of the flu and covid but pt stated she doesn't feel like she has either of those right now. She stated if she gets worse she will call us back.

## 2021-02-12 DIAGNOSIS — M170 Bilateral primary osteoarthritis of knee: Secondary | ICD-10-CM | POA: Diagnosis not present

## 2021-02-16 ENCOUNTER — Ambulatory Visit (INDEPENDENT_AMBULATORY_CARE_PROVIDER_SITE_OTHER): Payer: Medicare Other | Admitting: Adult Health

## 2021-02-16 ENCOUNTER — Encounter: Payer: Self-pay | Admitting: Adult Health

## 2021-02-16 VITALS — BP 188/90 | Ht 64.02 in | Wt 190.0 lb

## 2021-02-16 DIAGNOSIS — R7981 Abnormal blood-gas level: Secondary | ICD-10-CM | POA: Insufficient documentation

## 2021-02-16 DIAGNOSIS — J069 Acute upper respiratory infection, unspecified: Secondary | ICD-10-CM

## 2021-02-16 DIAGNOSIS — I10 Essential (primary) hypertension: Secondary | ICD-10-CM | POA: Diagnosis not present

## 2021-02-16 DIAGNOSIS — R062 Wheezing: Secondary | ICD-10-CM | POA: Diagnosis not present

## 2021-02-16 NOTE — Progress Notes (Addendum)
Virtual Visit via Telephone Note  I connected with Jamie Anderson on 02/16/21 at  3:00 PM EST by telephone and verified that I am speaking with the correct person using two identifiers.  Location: Patient: at home Provider: Provider: Provider's office at  [facility], [place].      I discussed the limitations, risks, security and privacy concerns of performing an evaluation and management service by telephone and the availability of in person appointments. I also discussed with the patient that there may be a patient responsible charge related to this service. The patient expressed understanding and agreed to proceed.   History of Present Illness: Patient is a 85 year old female called by provider for telephone call visit, she is audibly wheezing. Blood pressure 188/ 90 today. 89 % oxygen. Denies any cold medications. Onset 02/15/21 with dry cough, body aches, runny nose, elevated blood pressure and congestion with headache. Chest tightness   No oxygen use. Has tried to use her albuterol but states " it does not seem to be helping my breathing".    Patient  denies any fever, chills, rash, chest pain,  nausea, vomiting, or diarrhea.  Denies dizziness, lightheadedness, pre syncopal or syncopal episodes.   Review of Systems  Constitutional:  Positive for malaise/fatigue. Negative for chills, diaphoresis, fever and weight loss.  HENT:  Positive for congestion.   Respiratory:  Positive for cough, shortness of breath, wheezing and stridor. Negative for hemoptysis and sputum production.   Cardiovascular: Negative.   Gastrointestinal: Negative.   Genitourinary: Negative.   Musculoskeletal:  Positive for myalgias.  Neurological: Negative.   Psychiatric/Behavioral: Negative.     Observations/Objective:  Constantly wheezing on phone, with mild stridor at times with talking.   Assessment and Plan:  Primary hypertension  Wheezing  Low oxygen  saturation  Upper respiratory tract infection, unspecified type  Will have her go to emergency room  now at [facility], she has her grandson who is a paramedic and is going to drive her. Advised 911. Follow up after evaluation at ER. Not appropriate for telephone visit. Patient verbalized understanding of all instructions given and denies any further questions at this time.   Follow Up Instructions:    I discussed the assessment and treatment plan with the patient. The patient was provided an opportunity to ask questions and all were answered. The patient agreed with the plan and demonstrated an understanding of the instructions.   The patient was advised to call back or seek an in-person evaluation if the symptoms worsen or if the condition fails to improve as anticipated.  I provided 15 minutes of non-face-to-face time during this encounter.   Marcille Buffy, FNP

## 2021-03-12 DIAGNOSIS — M1711 Unilateral primary osteoarthritis, right knee: Secondary | ICD-10-CM | POA: Diagnosis not present

## 2021-03-12 DIAGNOSIS — M170 Bilateral primary osteoarthritis of knee: Secondary | ICD-10-CM | POA: Diagnosis not present

## 2021-03-16 ENCOUNTER — Ambulatory Visit: Payer: Medicare Other | Admitting: Dermatology

## 2021-03-16 ENCOUNTER — Other Ambulatory Visit: Payer: Self-pay

## 2021-03-16 DIAGNOSIS — D485 Neoplasm of uncertain behavior of skin: Secondary | ICD-10-CM

## 2021-03-16 DIAGNOSIS — L570 Actinic keratosis: Secondary | ICD-10-CM

## 2021-03-16 DIAGNOSIS — Z86007 Personal history of in-situ neoplasm of skin: Secondary | ICD-10-CM

## 2021-03-16 DIAGNOSIS — C44722 Squamous cell carcinoma of skin of right lower limb, including hip: Secondary | ICD-10-CM

## 2021-03-16 NOTE — Progress Notes (Signed)
° °  Follow-Up Visit   Subjective  Jamie Anderson is a 86 y.o. female who presents for the following: Follow-up (Patient here today for 3 month SCC follow up. Biopsy proven SCCis at right lateral pretibia treated at last visit with [facility]. Patient does have 2 spots at right hand that she would like checked. ).   The following portions of the chart were reviewed this encounter and updated as appropriate:       Review of Systems:  No other skin or systemic complaints except as noted in HPI or Assessment and Plan.  Objective  Well appearing patient in no apparent distress; mood and affect are within normal limits.  A focused examination was performed including legs, hands, arms. Relevant physical exam findings are noted in the Assessment and Plan.  right upper pretibia 0.7 cm pink crusted firm flat papule- frozen last visit, didn't clear up     Right Wrist x 3, left shoulder x 2, right mid pretibia x 1, left anterior ankle x 2, left mid pretibia x 2 (10) Pink keratotic papules    Assessment & Plan  Neoplasm of uncertain behavior of skin right upper pretibia  Skin / nail biopsy Type of biopsy: tangential   Informed consent: discussed and consent obtained   Patient was prepped and draped in usual sterile fashion: Area prepped with alcohol. Anesthesia: the lesion was anesthetized in a standard fashion   Anesthetic:  1% lidocaine w/ epinephrine 1-100,000 buffered w/ 8.4% NaHCO3 Instrument used: flexible razor blade   Hemostasis achieved with: pressure, aluminum chloride and electrodesiccation   Outcome: patient tolerated procedure well   Post-procedure details: wound care instructions given   Post-procedure details comment:  Ointment and small bandage applied  Specimen 1 - Surgical pathology Differential Diagnosis: recurrent hypertrophic AK r/o SCC  Check Margins: No 0.7 cm pink crusted firm flat papule  AK (actinic keratosis) (10) Right Wrist x 3, left shoulder x 2, right mid  pretibia x 1, left anterior ankle x 2, left mid pretibia x 2  Hypertrophic   Destruction of lesion - Right Wrist x 3, left shoulder x 2, right mid pretibia x 1, left anterior ankle x 2, left mid pretibia x 2  Destruction method: cryotherapy   Informed consent: discussed and consent obtained   Lesion destroyed using liquid nitrogen: Yes   Region frozen until ice ball extended beyond lesion: Yes   Outcome: patient tolerated procedure well with no complications   Post-procedure details: wound care instructions given   Additional details:  Prior to procedure, discussed risks of blister formation, small wound, skin dyspigmentation, or rare scar following cryotherapy. Recommend Vaseline ointment to treated areas while healing.    History of Squamous Cell Carcinoma in Situ of the Skin - No evidence of recurrence today R lateral pretibia and multiple R lower leg, see history - Recommend regular full body skin exams - Recommend daily broad spectrum sunscreen SPF 30+ to sun-exposed areas, reapply every 2 hours as needed.  - Call if any new or changing lesions are noted between office visits  Return in about 3 months (around 06/14/2021) for recheck SCC sites right lower leg and AK follow up.  Graciella Belton, RMA, am acting as scribe for Brendolyn Patty, MD .  Documentation: I have reviewed the above documentation for accuracy and completeness, and I agree with the above.  Brendolyn Patty MD

## 2021-03-16 NOTE — Patient Instructions (Addendum)
Cryotherapy Aftercare  Wash gently with soap and water everyday.   Apply Vaseline and Band-Aid daily until healed.    Wound Care Instructions  Cleanse wound gently with soap and water once a day then pat dry with clean gauze. Apply a thing coat of Petrolatum (petroleum jelly, "Vaseline") over the wound (unless you have an allergy to this). We recommend that you use a new, sterile tube of Vaseline. Do not pick or remove scabs. Do not remove the yellow or white "healing tissue" from the base of the wound.  Cover the wound with fresh, clean, nonstick gauze and secure with paper tape. You may use Band-Aids in place of gauze and tape if the would is small enough, but would recommend trimming much of the tape off as there is often too much. Sometimes Band-Aids can irritate the skin.  You should call the office for your biopsy report after 1 week if you have not already been contacted.  If you experience any problems, such as abnormal amounts of bleeding, swelling, significant bruising, significant pain, or evidence of infection, please call the office immediately.  Recommend daily broad spectrum sunscreen SPF 30+ to sun-exposed areas, reapply every 2 hours as needed. Call for new or changing lesions.  Staying in the shade or wearing long sleeves, sun glasses (UVA+UVB protection) and wide brim hats (4-inch brim around the entire circumference of the hat) are also recommended for sun protection.    If You Need Anything After Your Visit  If you have any questions or concerns for your doctor, please call our main line at [phone] and press option 4 to reach your doctor's medical assistant. If no one answers, please leave a voicemail as directed and we will return your call as soon as possible. Messages left after 4 pm will be answered the following business day.   You may also send [place] a message via [place]. We typically respond to MyChart messages within 1-2 business days.  For prescription  refills, please ask your pharmacy to contact our office. Our fax number is [phone].  If you have an urgent issue when the clinic is closed that cannot wait until the next business day, you can page your doctor at the number below.    Please note that while we do our best to be available for urgent issues outside of office hours, we are not available 24/7.   If you have an urgent issue and are unable to reach [place], you may choose to seek medical care at your doctor's office, retail clinic, urgent care center, or emergency room.  If you have a medical emergency, please immediately call 911 or go to the emergency department.  Pager Numbers  - Dr. Nehemiah Massed: [phone]  - Dr. Laurence Ferrari: [phone]  - Dr. Nicole Kindred: [phone]  In the event of inclement weather, please call our main line at [phone] for an update on the status of any delays or closures.  Dermatology Medication Tips: Please keep the boxes that topical medications come in in order to help keep track of the instructions about where and how to use these. Pharmacies typically print the medication instructions only on the boxes and not directly on the medication tubes.   If your medication is too expensive, please contact our office at [phone] option 4 or send [place] a message through [place].   We are unable to tell what your co-pay for medications will be in advance as this is different depending on your insurance coverage. However, we may be  able to find a substitute medication at lower cost or fill out paperwork to get insurance to cover a needed medication.   If a prior authorization is required to get your medication covered by your insurance company, please allow [place] 1-2 business days to complete this process.  Drug prices often vary depending on where the prescription is filled and some pharmacies may offer cheaper prices.  The website www.goodrx.com contains coupons for medications through different pharmacies. The  prices here do not account for what the cost may be with help from insurance (it may be cheaper with your insurance), but the website can give you the price if you did not use any insurance.  - You can print the associated coupon and take it with your prescription to the pharmacy.  - You may also stop by our office during regular business hours and pick up a GoodRx coupon card.  - If you need your prescription sent electronically to a different pharmacy, notify our office through [facility] or by phone at [phone] option 4.     Si Usted Necesita Algo Despus de Su Visita  Tambin puede enviarnos un mensaje a travs de Pharmacist, community. Por lo general respondemos a los mensajes de MyChart en el transcurso de 1 a 2 das hbiles.  Para renovar recetas, por favor pida a su farmacia que se ponga en contacto con nuestra oficina. Harland Dingwall de fax es [place] [phone].  Si tiene un asunto urgente cuando la clnica est cerrada y que no puede esperar hasta el siguiente da hbil, puede llamar/localizar a su doctor(a) al nmero que aparece a continuacin.   Por favor, tenga en cuenta que aunque hacemos todo lo posible para estar disponibles para asuntos urgentes fuera del horario de [place], no estamos disponibles las 24 horas del da, los 7 das de la [place].   Si tiene un problema urgente y no puede comunicarse con nosotros, puede optar por buscar atencin mdica  en el consultorio de su doctor(a), en una clnica privada, en un centro de atencin urgente o en una sala de emergencias.  Si tiene Engineering geologist, por favor llame inmediatamente al 911 o vaya a la sala de emergencias.  Nmeros de bper  - Dr. Nehemiah Massed: [phone]  - Dra. Moye: [phone]  - Dra. Nicole Kindred: [phone]  En caso de inclemencias del [place], por favor llame a Johnsie Kindred principal al [phone] para una actualizacin sobre el [place] de cualquier retraso o cierre.  Consejos para la medicacin en  dermatologa: Por favor, guarde las cajas en las que vienen los medicamentos de uso tpico para ayudarle a seguir las instrucciones sobre dnde y cmo usarlos. Las farmacias generalmente imprimen las instrucciones del medicamento slo en las cajas y no directamente en los tubos del [place].   Si su medicamento es muy caro, por favor, pngase en contacto con Zigmund Daniel llamando al [phone] y presione la opcin 4 o envenos un mensaje a travs de Pharmacist, community.   No podemos decirle cul ser su copago por los medicamentos por adelantado ya que esto es diferente dependiendo de la cobertura de su seguro. Sin embargo, es posible que podamos encontrar un medicamento sustituto a Electrical engineer un formulario para que el seguro cubra el medicamento que se considera necesario.   Si se requiere una autorizacin previa para que su compaa de seguros [place] su medicamento, por favor permtanos de 1 a 2 das hbiles para completar este proceso.  Los precios de los medicamentos varan con  frecuencia dependiendo del lugar de dnde se surte la receta y alguna farmacias pueden ofrecer precios ms baratos.  El sitio web www.goodrx.com tiene cupones para medicamentos de Airline pilot. Los precios aqu no tienen en cuenta lo que podra costar con la ayuda del seguro (puede ser ms barato con su seguro), pero el sitio web puede darle el precio si no utiliz Research scientist (physical sciences).  - Puede imprimir el cupn correspondiente y llevarlo con su receta a la farmacia.  - Tambin puede pasar por nuestra oficina durante el horario de atencin regular y Charity fundraiser una tarjeta de cupones de GoodRx.  - Si necesita que su receta se enve electrnicamente a una farmacia diferente, informe a nuestra oficina a travs de MyChart de [place] o por telfono llamando al [phone] y presione la opcin 4.

## 2021-03-17 DIAGNOSIS — M1711 Unilateral primary osteoarthritis, right knee: Secondary | ICD-10-CM | POA: Diagnosis not present

## 2021-03-18 ENCOUNTER — Telehealth: Payer: Self-pay | Admitting: Internal Medicine

## 2021-03-18 NOTE — Telephone Encounter (Signed)
Received surgical clearance form from [facility]. Patient last seen 09/2020. Spoke to patient and offered first available appt in [place]. She requested sooner appt then 04/17/2021 and she is willing to travel to [place].  Appt scheduled with Rexene Edison, NP 03/24/2021 at 4:00. Form has been faxed to [facility] office attn [facility]. Nothing further needed at this time.

## 2021-03-19 ENCOUNTER — Telehealth: Payer: Self-pay | Admitting: Cardiovascular Disease

## 2021-03-19 NOTE — Telephone Encounter (Signed)
° °  Pre-operative Risk Assessment    Patient Name: Jamie Anderson  DOB: [birth year] MRN: [id number]      Request for Surgical Clearance    Procedure:   RT TKA traditional Knee  Date of Surgery:  Clearance TBD                                 Surgeon:  Dr Kurtis Bushman Surgeon's Group or Practice Name:  [facility] Phone number:  [phone] Fax number:  [phone]   Type of Clearance Requested:   - Medical    Type of Anesthesia:  Local  / Spinal    Additional requests/questions:    SignedAce Gins   03/19/2021, 2:34 PM

## 2021-03-20 NOTE — Telephone Encounter (Signed)
Dr. Rockey Situ to review, patient has a history of coronary artery calcification on CT image and aortic atherosclerosis.  She also has family history of CAD with her brother having CABG and a sister with open heart surgery.  She herself has never been diagnosed with heart attack or stroke.  Other medical issues include hypertension, hyperlipidemia and history of basal cell carcinoma.  Talking with the patient, she denies any recent exertional chest pain or worsening dyspnea.  Her functional ability is limited by knee pain and back pain.  I am not confident patient can achieve 4 METS of activity at this time.  Would you recommend additional evaluation for this patient prior to clearance? She is on aspirin every other day.   Please forward your response to P CV DIV PREOP

## 2021-03-23 ENCOUNTER — Telehealth: Payer: Self-pay

## 2021-03-23 NOTE — Telephone Encounter (Signed)
Advised pt of bx results.  Pt is is suppose to have knee replacement soon.  She is not yet scheduled, she is waiting for clearance from her lung doctor.  I advised pt to call us after she talks to her Orthopedic surgeon and after she knows when her surgery date is.  I also advised her surgeon may not want her to have the [facility]  before knee surgery due to possibly having an open sore on the same leg that she is having knee replacement.

## 2021-03-23 NOTE — Telephone Encounter (Signed)
° °  Primary Cardiologist: Ida Rogue, MD  Chart reviewed as part of pre-operative protocol coverage. Given past medical history and time since last visit, based on ACC/AHA guidelines, Jamie Anderson would be at acceptable risk for the planned procedure without further cardiovascular testing.   Her RCRI is a class I risk, 0.4% risk of major cardiac event.  I will route this recommendation to the requesting party via Epic fax function and remove from pre-op pool.  Please call with questions.  [name]. Ryley Bachtel NP-C    03/23/2021, 9:34 AM [place] [address] Office [phone] Fax [phone]

## 2021-03-23 NOTE — Telephone Encounter (Signed)
-----   Message from Brendolyn Patty, MD sent at 03/23/2021  1:16 PM EST ----- Skin , right upper pretibia WELL DIFFERENTIATED SQUAMOUS CELL CARCINOMA WITH SUPERFICIAL INFILTRATION, BASE INVOLVED   SCC skin cancer, needs EDC - please call patient

## 2021-03-24 ENCOUNTER — Ambulatory Visit: Payer: Medicare Other | Admitting: Adult Health

## 2021-03-24 ENCOUNTER — Other Ambulatory Visit: Payer: Self-pay

## 2021-03-24 ENCOUNTER — Encounter: Payer: Self-pay | Admitting: Adult Health

## 2021-03-24 ENCOUNTER — Ambulatory Visit (INDEPENDENT_AMBULATORY_CARE_PROVIDER_SITE_OTHER): Payer: Medicare Other

## 2021-03-24 VITALS — BP 132/74 | HR 74 | Temp 98.0°F | Ht 63.0 in | Wt 187.4 lb

## 2021-03-24 DIAGNOSIS — J479 Bronchiectasis, uncomplicated: Secondary | ICD-10-CM

## 2021-03-24 DIAGNOSIS — Z01811 Encounter for preprocedural respiratory examination: Secondary | ICD-10-CM | POA: Diagnosis not present

## 2021-03-24 NOTE — Progress Notes (Signed)
@Patient  ID: Jamie Anderson, female    DOB: 08/11/1935, 86 y.o.   MRN: [id number]  Chief Complaint  Patient presents with   Follow-up    Referring provider: Crecencio Mc, MD  HPI: 86 yo female never smoker followed for chronic bronchitis and bronchiectasis    TEST/EVENTS :  Spirometry 09/15/2017 - Mild Obstructive Airways Disease without Significant Broncho-Dilator Response  03/24/2021 Follow up : Chronic bronchitis and bronchiectasis Patient returns for 22-month follow-up.  Patient has underlying chronic bronchitis and mild bronchiectasis.  She says overall her breathing is doing well.  She remains on Advair, uses as needed only Previous spirometry in 2019 showed mild airflow obstruction.Chest x-ray June 2022 showed chronic bronchitic changes. Patient denies any chest pain orthopnea PND or increased leg swelling.  No recent antibiotics. No increased albuterol use.   Right total knee replacement planned soon. Needs pulmonary risk assessment for surgery .   Lives at home, drives, does shopping and cooking . Does light house work . Walks some but not formal exercise.     Allergies  Allergen Reactions   Bran     Other reaction(s): Unknown   Clarithromycin     Other reaction(s): Unknown   Colloidal Oatmeal    Corn-Containing Products    Dust Mite Extract     Almonds,Mold Spores Other reaction(s): Unknown Almonds,Mold Spores Almonds,Mold Spores   Gluten Meal     Other reaction(s): Unknown   Oat    Other     Other reaction(s): Unknown Almonds,Mold Spores   Shrimp [Shellfish Allergy]     Immunization History  Administered Date(s) Administered   Influenza Split 11/25/2011   Influenza, High Dose Seasonal PF 01/30/2018   Influenza,inj,Quad PF,6+ Mos 11/22/2012   Influenza,inj,quad, With Preservative 12/07/2018   Influenza-Unspecified 12/20/2013, 01/07/2015, 01/09/2016, 12/15/2016, 12/06/2019, 01/15/2021   PFIZER(Purple Top)SARS-COV-2 Vaccination 04/28/2019,  05/23/2019, 01/01/2020, 07/31/2020   Pneumococcal Conjugate-13 01/03/2014   Pneumococcal Polysaccharide-23 11/25/2011, 02/09/2018   Tdap 12/06/2013   Zoster, Live 12/29/2009    Past Medical History:  Diagnosis Date   Basal cell carcinoma 01/17/2018   Left chest. Infiltrative. Excised 02/06/2018, margins free.   Cystic teratoma of right ovary 11/23/2012   She underwent right salpingo-oophorectomy on April 1 by Dr. Kenton Kingfisher for removal of what turned out to be a cystic teratoma. Hysteroscopy was also done at the time due to postmenopausal bleeding. It is unclear  why the left ovary was not taken out since she is postmenopausal.    Granuloma annulare    Hyperlipidemia    Hypertension    Menopause    Plantar fasciitis of right foot 12/24/2014   SCC (squamous cell carcinoma) 10/08/2020   R mid lower pretibia, EDC   SCC (squamous cell carcinoma) 03/16/2021   R upper pretibial, needs EDC   Squamous cell carcinoma of skin 02/26/2020   R lower pretibial, clear with bx 04/01/20   Squamous cell carcinoma of skin 07/15/2020   R mid pretibia, EDC   Squamous cell carcinoma of skin 12/09/2020   R lateral pretibia, EDC    Tobacco History: Social History   Tobacco Use  Smoking Status Never  Smokeless Tobacco Never   Counseling given: Not Answered   Outpatient Medications Prior to Visit  Medication Sig Dispense Refill   albuterol (VENTOLIN HFA) 108 (90 Base) MCG/ACT inhaler Inhale 1 puff into the lungs every 6 (six) hours as needed for wheezing or shortness of breath. 1 each 0   allopurinol (ZYLOPRIM) 100 MG tablet TAKE 1 TABLET  BY MOUTH  DAILY 90 tablet 3   aspirin EC 81 MG tablet Take 1 tablet (81 mg total) by mouth daily. 90 tablet 3   clobetasol cream (TEMOVATE) 0.05 % Apply to itchy rash on back 1-2 times a day until improved. Avoid face, groin, axilla. 30 g 1   Coenzyme Q10 (CO Q 10 PO) Take by mouth daily.     ezetimibe (ZETIA) 10 MG tablet TAKE 1 TABLET BY MOUTH  DAILY 90 tablet 3    fluticasone (FLONASE) 50 MCG/ACT nasal spray Place 2 sprays into both nostrils daily. 48 g 1   fluticasone-salmeterol (ADVAIR) 100-50 MCG/ACT AEPB Inhale 1 puff into the lungs 2 (two) times daily. Rinse mouth after each use. 1 each 3   metoprolol succinate (TOPROL-XL) 25 MG 24 hr tablet TAKE 1 TABLET BY MOUTH  DAILY 90 tablet 3   nystatin (MYCOSTATIN) 100000 UNIT/ML suspension Take 5 mLs (500,000 Units total) by mouth 4 (four) times daily. 60 mL 5   pantoprazole (PROTONIX) 40 MG tablet TAKE 1 TABLET BY MOUTH  DAILY 90 tablet 3   PREMARIN vaginal cream Place 1 Applicatorful vaginally 2 (two) times a week. 30 g 0   sertraline (ZOLOFT) 100 MG tablet TAKE 1 TABLET BY MOUTH  DAILY 90 tablet 3   simvastatin (ZOCOR) 40 MG tablet Take 1 tablet (40 mg total) by mouth at bedtime. 90 tablet 3   telmisartan (MICARDIS) 80 MG tablet Take 1 tablet (80 mg total) by mouth daily. 90 tablet 0   traMADol (ULTRAM) 50 MG tablet Take 1 tablet (50 mg total) by mouth every 12 (twelve) hours as needed for moderate pain. 60 tablet 5   No facility-administered medications prior to visit.     Review of Systems:   Constitutional:   No  weight loss, night sweats,  Fevers, chills, fatigue, or  lassitude.  HEENT:   No headaches,  Difficulty swallowing,  Tooth/dental problems, or  Sore throat,                No sneezing, itching, ear ache,  +nasal congestion, post nasal drip,   CV:  No chest pain,  Orthopnea, PND, swelling in lower extremities, anasarca, dizziness, palpitations, syncope.   GI  No heartburn, indigestion, abdominal pain, nausea, vomiting, diarrhea, change in bowel habits, loss of appetite, bloody stools.   Resp:   No chest wall deformity  Skin: no rash or lesions.  GU: no dysuria, change in color of urine, no urgency or frequency.  No flank pain, no hematuria   MS:  No joint pain or swelling.  No decreased range of motion.  No back pain.    Physical Exam  BP 132/74 (BP Location: Left Arm, Patient  Position: Sitting, Cuff Size: Normal)    Pulse 74    Temp 98 F (36.7 C) (Oral)    Ht 5\' 3"  (1.6 m)    Wt 187 lb 6.4 oz (85 kg)    SpO2 95%    BMI 33.20 kg/m   GEN: A/Ox3; pleasant , NAD, well nourished    HEENT:  [place]/AT,  NOSE-clear, THROAT-clear, no lesions, no postnasal drip or exudate noted.   NECK:  Supple w/ fair ROM; no JVD; normal carotid impulses w/o bruits; no thyromegaly or nodules palpated; no lymphadenopathy.    RESP  Clear  P & A; w/o, wheezes/ rales/ or rhonchi. no accessory muscle use, no dullness to percussion  CARD:  RRR, no m/r/g, no peripheral edema, pulses intact, no cyanosis or  clubbing.  GI:   Soft & nt; nml bowel sounds; no organomegaly or masses detected.   Musco: Warm bil, no deformities or joint swelling noted.   Neuro: alert, no focal deficits noted.    Skin: Warm, no lesions or rashes    Lab Results:  CBC    BNP No results found for: BNP  ProBNP No results found for: PROBNP  Imaging: No results found.    No flowsheet data found.  No results found for: NITRICOXIDE      Assessment & Plan:   Bronchiectasis without complication (HCC) Mild bronchiectasis and chronic bronchitis.  Patient is well controlled on current regimen.  Currently using Advair only as needed. Check chest x-ray today.  We will add in flutter valve to use as needed  Plan  Patient Instructions  Chest xray today .  Continue on Advair Twice daily  , rinse after use.  Albuterol inhaler As needed   Activity as tolerated.  Flutter valve Twice daily  As needed  Follow up with Dr. Mortimer Fries in 6 months and As needed       Preop pulmonary/respiratory exam Pulmonary preop risk assessment.  Patient is at a mild to moderate risk due to underlying chronic bronchitis/bronchiectasis and age.  Overall she is doing well with her bronchiectasis with no recent flare.  She is not on oxygen.  Fully independent. Went over potential pulmonary surgical risk from a pulmonary  standpoint    Major Pulmonary risks identified in the multifactorial risk analysis are but not limited to a) pneumonia; b) recurrent intubation risk; c) prolonged or recurrent acute respiratory failure needing mechanical ventilation; d) prolonged hospitalization; e) DVT/Pulmonary embolism; f) Acute Pulmonary edema  Recommend 1. Short duration of surgery as much as possible and avoid paralytic if possible 2. Recovery in step down or ICU with Pulmonary consultation if indicated  3. DVT prophylaxis if indicated  4. Aggressive pulmonary toilet with o2, bronchodilatation, and incentive spirometry, flutter valve  and early ambulation       Leilan Bochenek, NP 03/24/2021

## 2021-03-24 NOTE — Assessment & Plan Note (Signed)
Mild bronchiectasis and chronic bronchitis.  Patient is well controlled on current regimen.  Currently using Advair only as needed. Check chest x-ray today.  We will add in flutter valve to use as needed  Plan  Patient Instructions  Chest xray today .  Continue on Advair Twice daily  , rinse after use.  Albuterol inhaler As needed   Activity as tolerated.  Flutter valve Twice daily  As needed  Follow up with Dr. Mortimer Fries in 6 months and As needed

## 2021-03-24 NOTE — Patient Instructions (Signed)
Chest xray today .  Continue on Advair Twice daily  , rinse after use.  Albuterol inhaler As needed   Activity as tolerated.  Flutter valve Twice daily  As needed  Follow up with Dr. Mortimer Fries in 6 months and As needed

## 2021-03-24 NOTE — Assessment & Plan Note (Signed)
Pulmonary preop risk assessment.  Patient is at a mild to moderate risk due to underlying chronic bronchitis/bronchiectasis and age.  Overall she is doing well with her bronchiectasis with no recent flare.  She is not on oxygen.  Fully independent. Went over potential pulmonary surgical risk from a pulmonary standpoint    Major Pulmonary risks identified in the multifactorial risk analysis are but not limited to a) pneumonia; b) recurrent intubation risk; c) prolonged or recurrent acute respiratory failure needing mechanical ventilation; d) prolonged hospitalization; e) DVT/Pulmonary embolism; f) Acute Pulmonary edema  Recommend 1. Short duration of surgery as much as possible and avoid paralytic if possible 2. Recovery in step down or ICU with Pulmonary consultation if indicated  3. DVT prophylaxis if indicated  4. Aggressive pulmonary toilet with o2, bronchodilatation, and incentive spirometry, flutter valve  and early ambulation

## 2021-03-25 NOTE — Progress Notes (Signed)
ATC x1.  LVM to return call. 

## 2021-03-26 ENCOUNTER — Telehealth: Payer: Self-pay | Admitting: Adult Health

## 2021-03-26 NOTE — Telephone Encounter (Signed)
Called and spoke to patient in regards to CXR nothing further needed.

## 2021-03-27 NOTE — Telephone Encounter (Signed)
OV notes and clearance form have been faxed over to [facility]. Nothing further needed at this time. °

## 2021-03-30 ENCOUNTER — Telehealth: Payer: Self-pay | Admitting: *Deleted

## 2021-03-30 NOTE — Telephone Encounter (Deleted)
Called patient to discuss cxr results.  While on the phone she mentioned that she stared a zpack on Friday and send a mychart message to let Tammy know.  She stated she has one more tablet left.  She is getting green mucous out when she coughs and yellow out of her nose.  Her cough is no worse and she states she does not feel bad.  Advised I would let Tammy know and if she feels like she needs any further I would call her back.  She verbalized understanding.  Spoke with Tammy, she stated to give the antibiotic some time.  Nothing further needed.

## 2021-03-31 NOTE — Progress Notes (Signed)
Called and spoke with patient, advised of results/recommendations per Rexene Edison NP.  She verbalized understanding.  Noting further needed.

## 2021-04-08 NOTE — Telephone Encounter (Signed)
Called pt back to see if we could schedule her for treatment of her SCC on the R upper pretibia.  Pt has knee replacement surgery on 04/29/21 and it is on the R knee so I advised pt we should schedule EDC for after her knee replacement.  Pt is scheduled for EDC of SCC on 06/02/21./sh

## 2021-04-13 ENCOUNTER — Encounter: Payer: Self-pay | Admitting: Orthopedic Surgery

## 2021-04-13 ENCOUNTER — Other Ambulatory Visit: Payer: Self-pay | Admitting: Orthopedic Surgery

## 2021-04-13 DIAGNOSIS — Z01818 Encounter for other preprocedural examination: Secondary | ICD-10-CM

## 2021-04-13 NOTE — H&P (Signed)
Jamie Anderson MRN:  [id number] DOB/SEX:  1935/11/26/female  CHIEF COMPLAINT:  Painful right Knee  HISTORY: Patient is a 86 y.o. female presented with a history of pain in the right knee. Onset of symptoms was gradual starting several years ago with gradually worsening course since that time. Prior procedures on the knee include none. Patient has been treated conservatively with over-the-counter NSAIDs and activity modification. Patient currently rates pain in the knee at 10 out of 10 with activity. There is pain at night.  PAST MEDICAL HISTORY: Patient Active Problem List   Diagnosis Date Noted   Bronchiectasis without complication ([name]) 00/86/7619   Preop pulmonary/respiratory exam 03/24/2021   Low oxygen saturation 02/16/2021   Wheezing 08/18/2020   Varicose veins of both lower extremities with complications 50/93/2671   Degenerative lumbar spinal stenosis 03/15/2018   CKD (chronic kidney disease) stage 3, GFR 30-59 ml/min (HCC) 03/15/2018   Atypical chest pain 01/22/2017   Aortic atherosclerosis ([place]) 01/22/2017   Post-menopausal atrophic vaginitis 01/18/2016   Encounter for preventive health examination 01/18/2016   Colon cancer screening 01/16/2015   Upper respiratory tract infection 04/09/2014   Obesity 12/31/2012   Obsessive compulsive disorder 12/29/2012   Gout 04/13/2012   Right knee DJD 04/03/2012   Medicare annual wellness visit, subsequent 11/26/2011   Hyperlipidemia LDL goal <70 05/25/2011   Primary hypertension 05/25/2011   Major depressive disorder, single episode, mild ([place]) 05/25/2011   Past Medical History:  Diagnosis Date   Basal cell carcinoma 01/17/2018   Left chest. Infiltrative. Excised 02/06/2018, margins free.   Cystic teratoma of right ovary 11/23/2012   She underwent right salpingo-oophorectomy on April 1 by Dr. Kenton Kingfisher for removal of what turned out to be a cystic teratoma. Hysteroscopy was also done at the time due to postmenopausal bleeding. It is  unclear  why the left ovary was not taken out since she is postmenopausal.    Granuloma annulare    Hyperlipidemia    Hypertension    Menopause    Plantar fasciitis of right foot 12/24/2014   SCC (squamous cell carcinoma) 10/08/2020   R mid lower pretibia, EDC   SCC (squamous cell carcinoma) 03/16/2021   R upper pretibial, needs EDC pt sched 06/02/21   Squamous cell carcinoma of skin 02/26/2020   R lower pretibial, clear with bx 04/01/20   Squamous cell carcinoma of skin 07/15/2020   R mid pretibia, EDC   Squamous cell carcinoma of skin 12/09/2020   R lateral pretibia, EDC   Past Surgical History:  Procedure Laterality Date   APPENDECTOMY     DILATION AND CURETTAGE OF UTERUS     RIGHT OOPHORECTOMY Right feb 2014   [name]   SPINE SURGERY  2010   VENOUS ABLATION     bilateral     MEDICATIONS:  (Not in a hospital admission)   ALLERGIES:   Allergies  Allergen Reactions   Bran     Other reaction(s): Unknown   Clarithromycin     Other reaction(s): Unknown   Colloidal Oatmeal    Corn-Containing Products    Dust Mite Extract     Almonds,Mold Spores Other reaction(s): Unknown Almonds,Mold Spores Almonds,Mold Spores   Gluten Meal     Other reaction(s): Unknown   Oat    Other     Other reaction(s): Unknown Almonds,Mold Spores   Shrimp [Shellfish Allergy]     REVIEW OF SYSTEMS:  Pertinent items are noted in HPI.   FAMILY HISTORY:   Family History  Problem Relation  Age of Onset   Early death Mother        durig childbirth   Heart disease Father    Heart attack Father    Breast cancer Sister 9       half sister   Heart disease Sister    Cancer Brother    Heart disease Brother        Afib    SOCIAL HISTORY:   Social History   Tobacco Use   Smoking status: Never   Smokeless tobacco: Never  Substance Use Topics   Alcohol use: No    Alcohol/week: 4.0 standard drinks    Types: 4 Glasses of wine per week     EXAMINATION:  Vital signs in last 24  hours: @VSRANGES @  General appearance: alert, cooperative, and no distress Neck: no JVD and supple, symmetrical, trachea midline Lungs: clear to auscultation bilaterally Heart: regular rate and rhythm, S1, S2 normal, no murmur, click, rub or gallop Abdomen: soft, non-tender; bowel sounds normal; no masses,  no organomegaly Extremities: extremities normal, atraumatic, no cyanosis or edema and Homans sign is negative, no sign of DVT Pulses: 2+ and symmetric Skin: Skin color, texture, turgor normal. No rashes or lesions Neurologic: Alert and oriented X 3, normal strength and tone. Normal symmetric reflexes. Normal coordination and gait  Musculoskeletal:  ROM 0-100, Ligaments intact,  Imaging Review Plain radiographs demonstrate severe degenerative joint disease of the right knee. The overall alignment is significant varus. The bone quality appears to be good for age and reported activity level.  Assessment/Plan: Primary osteoarthritis, left knee   The patient history, physical examination and imaging studies are consistent with advanced degenerative joint disease of the left knee. The patient has failed conservative treatment.  The clearance notes were reviewed.  After discussion with the patient it was felt that Total Knee Replacement was indicated. The procedure,  risks, and benefits of total knee arthroplasty were presented and reviewed. The risks including but not limited to aseptic loosening, infection, blood clots, vascular injury, stiffness, patella tracking problems complications among others were discussed. The patient acknowledged the explanation, agreed to proceed with the plan.  [name] 04/13/2021, 2:50 PM

## 2021-04-16 ENCOUNTER — Other Ambulatory Visit: Payer: Self-pay

## 2021-04-16 ENCOUNTER — Encounter
Admission: RE | Admit: 2021-04-16 | Discharge: 2021-04-16 | Disposition: A | Discharge status: Home / Self Care | Payer: Medicare Other | Source: Ambulatory Visit | Attending: Orthopedic Surgery | Admitting: Orthopedic Surgery

## 2021-04-16 VITALS — BP 157/69 | HR 77 | Resp 20 | Ht 64.0 in | Wt 189.0 lb

## 2021-04-16 DIAGNOSIS — Z01812 Encounter for preprocedural laboratory examination: Secondary | ICD-10-CM | POA: Diagnosis not present

## 2021-04-16 DIAGNOSIS — Z01818 Encounter for other preprocedural examination: Secondary | ICD-10-CM

## 2021-04-16 HISTORY — DX: Unspecified asthma, uncomplicated: J45.909

## 2021-04-16 HISTORY — DX: Dyspnea, unspecified: R06.00

## 2021-04-16 HISTORY — DX: Gastro-esophageal reflux disease without esophagitis: K21.9

## 2021-04-16 HISTORY — DX: Unspecified osteoarthritis, unspecified site: M19.90

## 2021-04-16 HISTORY — DX: Chronic kidney disease, stage 3b: N18.32

## 2021-04-16 LAB — URINALYSIS, ROUTINE W REFLEX MICROSCOPIC
Bilirubin Urine: NEGATIVE
Glucose, UA: NEGATIVE mg/dL
Hgb urine dipstick: NEGATIVE
Ketones, ur: NEGATIVE mg/dL
Leukocytes,Ua: NEGATIVE
Nitrite: NEGATIVE
Protein, ur: NEGATIVE mg/dL
Specific Gravity, Urine: 1.017 (ref 1.005–1.030)
pH: 6 (ref 5.0–8.0)

## 2021-04-16 LAB — SURGICAL PCR SCREEN
MRSA, PCR: NEGATIVE
Staphylococcus aureus: NEGATIVE

## 2021-04-16 LAB — TYPE AND SCREEN
ABO/RH(D): A POS
Antibody Screen: NEGATIVE

## 2021-04-16 NOTE — Patient Instructions (Addendum)
Your procedure is scheduled on: 04/29/21 Report to [place]. To find out your arrival time please call [phone] between 1PM - 3PM on 04/28/20.  Remember: Instructions that are not followed completely may result in serious medical risk, up to and including death, or upon the discretion of your surgeon and anesthesiologist your surgery may need to be rescheduled.     _X__ 1. Do not eat food  or drink any liquids after midnight the night before your procedure.                 No gum chewing or hard candies.   __X__2.  On the morning of surgery brush your teeth with toothpaste and water, you                 may rinse your mouth with mouthwash if you wish.  Do not swallow any              toothpaste of mouthwash.     _X__ 3.  No Alcohol for 24 hours before or after surgery.   _X__ 4.  Do Not Smoke or use e-cigarettes For 24 Hours Prior to Your Surgery.                 Do not use any chewable tobacco products for at least 6 hours prior to                 surgery.  ____  5.  Bring all medications with you on the day of surgery if instructed.   __X__  6.  Notify your doctor if there is any change in your medical condition      (cold, fever, infections).     Do not wear jewelry, make-up, hairpins, clips or nail polish. Do not wear lotions, powders, or perfumes.  Do not shave body hair 48 hours prior to surgery. Men may shave face and neck. Do not bring valuables to the hospital.    [facility] is not responsible for any belongings or valuables.  Contacts, dentures/partials or body piercings may not be worn into surgery. Bring a case for your contacts, glasses or hearing aids, a denture cup will be supplied. Leave your suitcase in the car. After surgery it may be brought to your room. For patients admitted to the hospital, discharge time is determined by your treatment team.   Patients discharged the day of surgery will not be  allowed to drive home.   Please read over the following fact sheets that you were given:   Infection Prevention, Incentive spirometer, CHG soap  __X__ Take these medicines the morning of surgery with A SIP OF WATER:    1. allopurinol (ZYLOPRIM) 100 MG tablet  2. ezetimibe (ZETIA) 10 MG tablet  3. pantoprazole (PROTONIX) 40 MG tablet  4. sertraline (ZOLOFT) 100 MG tablet   5. fluticasone (FLONASE) 50 MCG/ACT nasal spray if needed  6. cetirizine (ZYRTEC) 10 MG tablet if needed  7. fluticasone-salmeterol (ADVAIR) 100-50 MCG/ACT AEPB inhaler if needed  ____ Fleet Enema (as directed)   __X__ Use CHG Soap/SAGE wipes as directed  __X__ Use inhalers on the day of surgery  ____ Stop metformin/Janumet/Farxiga 2 days prior to surgery    ____ Take 1/2 of usual insulin dose the night before surgery. No insulin the morning          of surgery.   ____ Stop Blood Thinners Coumadin/Plavix/Xarelto/Pleta/Pradaxa/Eliquis/Effient/Aspirin  on   Or  contact your Surgeon, Cardiologist or Medical Doctor regarding  ability to stop your blood thinners  __X__ Stop Anti-inflammatories 7 days before surgery such as Advil, Ibuprofen, Motrin,  BC or Goodies Powder, Naprosyn, Naproxen, Aleve, Aspirin    __X__ Stop all herbals and supplements, fish oil or vitamins for 7 days until after surgery.    ____ Bring C-Pap to the hospital.

## 2021-04-27 ENCOUNTER — Other Ambulatory Visit: Admission: RE | Admit: 2021-04-27 | Payer: Medicare Other | Source: Ambulatory Visit

## 2021-04-28 ENCOUNTER — Other Ambulatory Visit
Admission: RE | Admit: 2021-04-28 | Discharge: 2021-04-28 | Disposition: A | Discharge status: Home / Self Care | Payer: Medicare Other | Source: Ambulatory Visit | Attending: Orthopedic Surgery | Admitting: Orthopedic Surgery

## 2021-04-28 ENCOUNTER — Other Ambulatory Visit: Payer: Self-pay

## 2021-04-28 DIAGNOSIS — Z20822 Contact with and (suspected) exposure to covid-19: Secondary | ICD-10-CM | POA: Diagnosis not present

## 2021-04-28 DIAGNOSIS — Z01812 Encounter for preprocedural laboratory examination: Secondary | ICD-10-CM | POA: Diagnosis not present

## 2021-04-28 LAB — SARS CORONAVIRUS 2 (TAT 6-24 HRS): SARS Coronavirus 2: NEGATIVE

## 2021-04-29 ENCOUNTER — Encounter
Admission: AD | Disposition: A | Discharge status: Skilled Nursing Facility | Payer: Self-pay | Source: Home / Self Care | Attending: Orthopedic Surgery

## 2021-04-29 ENCOUNTER — Observation Stay: Payer: Medicare Other

## 2021-04-29 ENCOUNTER — Other Ambulatory Visit: Payer: Self-pay

## 2021-04-29 ENCOUNTER — Ambulatory Visit: Payer: Medicare Other | Admitting: Urgent Care

## 2021-04-29 ENCOUNTER — Encounter: Payer: Self-pay | Admitting: Orthopedic Surgery

## 2021-04-29 ENCOUNTER — Ambulatory Visit: Payer: Medicare Other | Admitting: Anesthesiology

## 2021-04-29 ENCOUNTER — Inpatient Hospital Stay
Admission: AD | Admit: 2021-04-29 | Discharge: 2021-05-05 | DRG: 470 | Disposition: A | Discharge status: Skilled Nursing Facility | Payer: Medicare Other | Attending: Orthopedic Surgery | Admitting: Orthopedic Surgery

## 2021-04-29 ENCOUNTER — Ambulatory Visit: Payer: Medicare Other

## 2021-04-29 DIAGNOSIS — J479 Bronchiectasis, uncomplicated: Secondary | ICD-10-CM | POA: Diagnosis present

## 2021-04-29 DIAGNOSIS — K219 Gastro-esophageal reflux disease without esophagitis: Secondary | ICD-10-CM | POA: Diagnosis not present

## 2021-04-29 DIAGNOSIS — I129 Hypertensive chronic kidney disease with stage 1 through stage 4 chronic kidney disease, or unspecified chronic kidney disease: Secondary | ICD-10-CM | POA: Diagnosis present

## 2021-04-29 DIAGNOSIS — M25561 Pain in right knee: Secondary | ICD-10-CM | POA: Diagnosis not present

## 2021-04-29 DIAGNOSIS — R110 Nausea: Secondary | ICD-10-CM | POA: Diagnosis not present

## 2021-04-29 DIAGNOSIS — Z8249 Family history of ischemic heart disease and other diseases of the circulatory system: Secondary | ICD-10-CM

## 2021-04-29 DIAGNOSIS — R197 Diarrhea, unspecified: Secondary | ICD-10-CM | POA: Diagnosis not present

## 2021-04-29 DIAGNOSIS — Z803 Family history of malignant neoplasm of breast: Secondary | ICD-10-CM

## 2021-04-29 DIAGNOSIS — J45909 Unspecified asthma, uncomplicated: Secondary | ICD-10-CM | POA: Diagnosis present

## 2021-04-29 DIAGNOSIS — M1711 Unilateral primary osteoarthritis, right knee: Principal | ICD-10-CM | POA: Diagnosis present

## 2021-04-29 DIAGNOSIS — Z419 Encounter for procedure for purposes other than remedying health state, unspecified: Secondary | ICD-10-CM

## 2021-04-29 DIAGNOSIS — Z96651 Presence of right artificial knee joint: Secondary | ICD-10-CM | POA: Diagnosis not present

## 2021-04-29 DIAGNOSIS — Z79899 Other long term (current) drug therapy: Secondary | ICD-10-CM

## 2021-04-29 DIAGNOSIS — Z90721 Acquired absence of ovaries, unilateral: Secondary | ICD-10-CM

## 2021-04-29 DIAGNOSIS — I82431 Acute embolism and thrombosis of right popliteal vein: Secondary | ICD-10-CM | POA: Diagnosis not present

## 2021-04-29 DIAGNOSIS — I10 Essential (primary) hypertension: Secondary | ICD-10-CM | POA: Diagnosis present

## 2021-04-29 DIAGNOSIS — I480 Paroxysmal atrial fibrillation: Secondary | ICD-10-CM | POA: Diagnosis not present

## 2021-04-29 DIAGNOSIS — Z7951 Long term (current) use of inhaled steroids: Secondary | ICD-10-CM

## 2021-04-29 DIAGNOSIS — G8918 Other acute postprocedural pain: Secondary | ICD-10-CM | POA: Diagnosis not present

## 2021-04-29 DIAGNOSIS — I82461 Acute embolism and thrombosis of right calf muscular vein: Secondary | ICD-10-CM | POA: Clinically undetermined

## 2021-04-29 DIAGNOSIS — R531 Weakness: Secondary | ICD-10-CM | POA: Diagnosis not present

## 2021-04-29 DIAGNOSIS — N1832 Chronic kidney disease, stage 3b: Secondary | ICD-10-CM | POA: Diagnosis present

## 2021-04-29 DIAGNOSIS — R7989 Other specified abnormal findings of blood chemistry: Secondary | ICD-10-CM | POA: Diagnosis not present

## 2021-04-29 DIAGNOSIS — R52 Pain, unspecified: Secondary | ICD-10-CM

## 2021-04-29 DIAGNOSIS — D631 Anemia in chronic kidney disease: Secondary | ICD-10-CM | POA: Diagnosis present

## 2021-04-29 DIAGNOSIS — Z85828 Personal history of other malignant neoplasm of skin: Secondary | ICD-10-CM

## 2021-04-29 DIAGNOSIS — Z7982 Long term (current) use of aspirin: Secondary | ICD-10-CM

## 2021-04-29 DIAGNOSIS — Z96659 Presence of unspecified artificial knee joint: Secondary | ICD-10-CM

## 2021-04-29 DIAGNOSIS — I951 Orthostatic hypotension: Secondary | ICD-10-CM | POA: Clinically undetermined

## 2021-04-29 DIAGNOSIS — Z20822 Contact with and (suspected) exposure to covid-19: Secondary | ICD-10-CM | POA: Diagnosis present

## 2021-04-29 DIAGNOSIS — E785 Hyperlipidemia, unspecified: Secondary | ICD-10-CM | POA: Diagnosis present

## 2021-04-29 DIAGNOSIS — Z471 Aftercare following joint replacement surgery: Secondary | ICD-10-CM | POA: Diagnosis not present

## 2021-04-29 HISTORY — PX: TOTAL KNEE ARTHROPLASTY: SHX125

## 2021-04-29 LAB — ABO/RH: ABO/RH(D): A POS

## 2021-04-29 SURGERY — ARTHROPLASTY, KNEE, TOTAL
Anesthesia: General | Site: Knee | Laterality: Right

## 2021-04-29 MED ORDER — ASPIRIN 81 MG PO CHEW
CHEWABLE_TABLET | ORAL | Status: AC
Start: 2021-04-29 — End: 2021-04-29
  Administered 2021-04-29: 81 mg via ORAL
  Filled 2021-04-29: qty 1

## 2021-04-29 MED ORDER — TRANEXAMIC ACID-NACL 1000-0.7 MG/100ML-% IV SOLN: 1000.0000 mg | INTRAVENOUS | Status: DC

## 2021-04-29 MED ORDER — KETOROLAC TROMETHAMINE 15 MG/ML IJ SOLN
INTRAMUSCULAR | Status: AC
  Administered 2021-04-29: 7.5 mg via INTRAVENOUS
  Filled 2021-04-29: qty 1

## 2021-04-29 MED ORDER — DIPHENHYDRAMINE HCL 12.5 MG/5ML PO ELIX
ORAL_SOLUTION | ORAL | Status: AC
  Filled 2021-04-29: qty 10

## 2021-04-29 MED ORDER — ASPIRIN 81 MG PO CHEW
81.0000 mg | CHEWABLE_TABLET | Freq: Two times a day (BID) | ORAL | Status: DC
  Administered 2021-05-01 – 2021-05-05 (×8): 81 mg via ORAL
  Filled 2021-04-29 (×8): qty 1

## 2021-04-29 MED ORDER — BUPIVACAINE LIPOSOME 1.3 % IJ SUSP
INTRAMUSCULAR | Status: AC
  Filled 2021-04-29: qty 20

## 2021-04-29 MED ORDER — SUGAMMADEX SODIUM 200 MG/2ML IV SOLN
INTRAVENOUS | Status: DC | PRN
  Administered 2021-04-29: 200 mg via INTRAVENOUS

## 2021-04-29 MED ORDER — CALCIUM CARBONATE ANTACID 500 MG PO CHEW
1.0000 | CHEWABLE_TABLET | Freq: Every day | ORAL | Status: DC | PRN
  Filled 2021-04-29: qty 1

## 2021-04-29 MED ORDER — PRONTOSAN WOUND IRRIGATION OPTIME
TOPICAL | Status: DC | PRN
  Administered 2021-04-29: 1

## 2021-04-29 MED ORDER — FAMOTIDINE 20 MG PO TABS: 20.0000 mg | ORAL_TABLET | Freq: Once | ORAL | Status: AC

## 2021-04-29 MED ORDER — METOCLOPRAMIDE HCL 5 MG/ML IJ SOLN: 5.0000 mg | Freq: Three times a day (TID) | INTRAMUSCULAR | Status: DC | PRN

## 2021-04-29 MED ORDER — ALLOPURINOL 100 MG PO TABS
100.0000 mg | ORAL_TABLET | Freq: Every day | ORAL | Status: DC
Start: 2021-04-30 — End: 2021-05-05
  Administered 2021-04-30 – 2021-05-05 (×6): 100 mg via ORAL
  Filled 2021-04-29 (×6): qty 1

## 2021-04-29 MED ORDER — BISACODYL 10 MG RE SUPP
10.0000 mg | Freq: Every day | RECTAL | Status: DC | PRN
  Filled 2021-04-29: qty 1

## 2021-04-29 MED ORDER — VANCOMYCIN HCL IN DEXTROSE 1-5 GM/200ML-% IV SOLN
INTRAVENOUS | Status: AC
  Filled 2021-04-29: qty 200

## 2021-04-29 MED ORDER — CHLORHEXIDINE GLUCONATE 0.12 % MT SOLN
OROMUCOSAL | Status: AC
  Administered 2021-04-29: 15 mL via OROMUCOSAL
  Filled 2021-04-29: qty 15

## 2021-04-29 MED ORDER — ALUM & MAG HYDROXIDE-SIMETH 200-200-20 MG/5ML PO SUSP: 30.0000 mL | ORAL | Status: DC | PRN

## 2021-04-29 MED ORDER — TRANEXAMIC ACID-NACL 1000-0.7 MG/100ML-% IV SOLN
INTRAVENOUS | Status: AC
  Filled 2021-04-29: qty 100

## 2021-04-29 MED ORDER — DEXMEDETOMIDINE HCL IN NACL 200 MCG/50ML IV SOLN
INTRAVENOUS | Status: DC | PRN
  Administered 2021-04-29: 8 ug via INTRAVENOUS
  Administered 2021-04-29: 12 ug via INTRAVENOUS

## 2021-04-29 MED ORDER — DIPHENHYDRAMINE HCL 12.5 MG/5ML PO ELIX
12.5000 mg | ORAL_SOLUTION | ORAL | Status: DC | PRN
  Administered 2021-04-29 – 2021-05-01 (×2): 25 mg via ORAL
  Filled 2021-04-29 (×2): qty 10

## 2021-04-29 MED ORDER — LACTATED RINGERS IV SOLN: INTRAVENOUS | Status: DC

## 2021-04-29 MED ORDER — HYDROCODONE-ACETAMINOPHEN 7.5-325 MG PO TABS
1.0000 | ORAL_TABLET | ORAL | Status: DC | PRN
  Administered 2021-05-01: 2 via ORAL
  Administered 2021-05-01: 1 via ORAL
  Administered 2021-05-01: 2 via ORAL
  Administered 2021-05-02 – 2021-05-03 (×3): 1 via ORAL
  Filled 2021-04-29 (×4): qty 1

## 2021-04-29 MED ORDER — SODIUM CHLORIDE 0.9 % IR SOLN
Status: DC | PRN
  Administered 2021-04-29: 3000 mL
  Administered 2021-04-29: 1000 mL

## 2021-04-29 MED ORDER — HYDROMORPHONE HCL 1 MG/ML IJ SOLN
INTRAMUSCULAR | Status: AC
  Filled 2021-04-29: qty 1

## 2021-04-29 MED ORDER — SERTRALINE HCL 50 MG PO TABS
100.0000 mg | ORAL_TABLET | Freq: Every day | ORAL | Status: DC
  Administered 2021-04-30 – 2021-05-05 (×6): 100 mg via ORAL
  Filled 2021-04-29: qty 2
  Filled 2021-04-29: qty 1
  Filled 2021-04-29 (×2): qty 2
  Filled 2021-04-29: qty 1
  Filled 2021-04-29: qty 2

## 2021-04-29 MED ORDER — BUPIVACAINE HCL (PF) 0.5 % IJ SOLN
INTRAMUSCULAR | Status: AC
  Filled 2021-04-29: qty 20

## 2021-04-29 MED ORDER — FENTANYL CITRATE PF 50 MCG/ML IJ SOSY: 50.0000 ug | PREFILLED_SYRINGE | Freq: Once | INTRAMUSCULAR | Status: AC

## 2021-04-29 MED ORDER — BUPIVACAINE-EPINEPHRINE (PF) 0.5% -1:200000 IJ SOLN
INTRAMUSCULAR | Status: AC
  Filled 2021-04-29: qty 30

## 2021-04-29 MED ORDER — MOMETASONE FURO-FORMOTEROL FUM 100-5 MCG/ACT IN AERO
2.0000 | INHALATION_SPRAY | Freq: Two times a day (BID) | RESPIRATORY_TRACT | Status: DC | PRN
  Filled 2021-04-29: qty 8.8

## 2021-04-29 MED ORDER — HYDROMORPHONE HCL 1 MG/ML IJ SOLN
INTRAMUSCULAR | Status: DC | PRN
  Administered 2021-04-29 (×2): .5 mg via INTRAVENOUS

## 2021-04-29 MED ORDER — EZETIMIBE 10 MG PO TABS
10.0000 mg | ORAL_TABLET | Freq: Every day | ORAL | Status: DC
Start: 2021-04-29 — End: 2021-05-05
  Administered 2021-04-29 – 2021-05-05 (×6): 10 mg via ORAL
  Filled 2021-04-29 (×8): qty 1

## 2021-04-29 MED ORDER — MENTHOL 3 MG MT LOZG
1.0000 | LOZENGE | OROMUCOSAL | Status: DC | PRN
  Filled 2021-04-29: qty 9

## 2021-04-29 MED ORDER — ONDANSETRON HCL 4 MG/2ML IJ SOLN
INTRAMUSCULAR | Status: DC | PRN
  Administered 2021-04-29: 4 mg via INTRAVENOUS

## 2021-04-29 MED ORDER — PROPOFOL 10 MG/ML IV BOLUS
INTRAVENOUS | Status: DC | PRN
  Administered 2021-04-29: 150 mg via INTRAVENOUS

## 2021-04-29 MED ORDER — ORAL CARE MOUTH RINSE: 15.0000 mL | Freq: Once | OROMUCOSAL | Status: AC

## 2021-04-29 MED ORDER — HYDROCODONE-ACETAMINOPHEN 5-325 MG PO TABS
1.0000 | ORAL_TABLET | ORAL | Status: DC | PRN
  Administered 2021-04-30: 2 via ORAL

## 2021-04-29 MED ORDER — FENTANYL CITRATE (PF) 100 MCG/2ML IJ SOLN
25.0000 ug | INTRAMUSCULAR | Status: DC | PRN
  Administered 2021-04-29: 50 ug via INTRAVENOUS

## 2021-04-29 MED ORDER — FENTANYL CITRATE PF 50 MCG/ML IJ SOSY
PREFILLED_SYRINGE | INTRAMUSCULAR | Status: AC
  Administered 2021-04-29: 50 ug via INTRAVENOUS
  Filled 2021-04-29: qty 1

## 2021-04-29 MED ORDER — SODIUM CHLORIDE (PF) 0.9 % IJ SOLN
INTRAMUSCULAR | Status: DC | PRN
  Administered 2021-04-29: 90 mL via INTRA_ARTICULAR

## 2021-04-29 MED ORDER — IRBESARTAN 150 MG PO TABS
300.0000 mg | ORAL_TABLET | Freq: Every day | ORAL | Status: DC
Start: 2021-04-29 — End: 2021-05-03
  Administered 2021-04-29 – 2021-05-03 (×5): 300 mg via ORAL
  Filled 2021-04-29 (×5): qty 2

## 2021-04-29 MED ORDER — VANCOMYCIN HCL IN DEXTROSE 1-5 GM/200ML-% IV SOLN
1000.0000 mg | INTRAVENOUS | Status: AC
  Administered 2021-04-29: 1000 mg via INTRAVENOUS

## 2021-04-29 MED ORDER — BUPIVACAINE HCL (PF) 0.5 % IJ SOLN
INTRAMUSCULAR | Status: DC | PRN
  Administered 2021-04-29 (×2): 10 mL via PERINEURAL

## 2021-04-29 MED ORDER — KETOROLAC TROMETHAMINE 15 MG/ML IJ SOLN: 7.5000 mg | Freq: Four times a day (QID) | INTRAMUSCULAR | Status: AC

## 2021-04-29 MED ORDER — ROCURONIUM BROMIDE 100 MG/10ML IV SOLN
INTRAVENOUS | Status: DC | PRN
Start: 2021-04-29 — End: 2021-04-29
  Administered 2021-04-29: 50 mg via INTRAVENOUS

## 2021-04-29 MED ORDER — LIDOCAINE HCL (CARDIAC) PF 100 MG/5ML IV SOSY
PREFILLED_SYRINGE | INTRAVENOUS | Status: DC | PRN
  Administered 2021-04-29: 100 mg via INTRAVENOUS

## 2021-04-29 MED ORDER — ACETAMINOPHEN 325 MG PO TABS: 325.0000 mg | ORAL_TABLET | Freq: Four times a day (QID) | ORAL | Status: DC | PRN

## 2021-04-29 MED ORDER — HYDROCODONE-ACETAMINOPHEN 5-325 MG PO TABS
ORAL_TABLET | ORAL | Status: AC
  Administered 2021-04-29: 2 via ORAL
  Filled 2021-04-29: qty 2

## 2021-04-29 MED ORDER — METHOCARBAMOL 500 MG PO TABS
500.0000 mg | ORAL_TABLET | Freq: Four times a day (QID) | ORAL | Status: DC | PRN
  Administered 2021-04-30 – 2021-05-01 (×3): 500 mg via ORAL

## 2021-04-29 MED ORDER — LIDOCAINE HCL (PF) 1 % IJ SOLN
INTRAMUSCULAR | Status: AC
  Filled 2021-04-29: qty 5

## 2021-04-29 MED ORDER — PANTOPRAZOLE SODIUM 40 MG PO TBEC
DELAYED_RELEASE_TABLET | ORAL | Status: AC
  Administered 2021-04-29: 40 mg via ORAL
  Filled 2021-04-29: qty 1

## 2021-04-29 MED ORDER — CHLORHEXIDINE GLUCONATE 0.12 % MT SOLN: 15.0000 mL | Freq: Once | OROMUCOSAL | Status: AC

## 2021-04-29 MED ORDER — METOCLOPRAMIDE HCL 10 MG PO TABS: 5.0000 mg | ORAL_TABLET | Freq: Three times a day (TID) | ORAL | Status: DC | PRN

## 2021-04-29 MED ORDER — PANTOPRAZOLE SODIUM 40 MG PO TBEC
40.0000 mg | DELAYED_RELEASE_TABLET | Freq: Every day | ORAL | Status: DC
  Administered 2021-05-01 – 2021-05-05 (×5): 40 mg via ORAL
  Filled 2021-04-29 (×4): qty 1

## 2021-04-29 MED ORDER — METOPROLOL TARTRATE 5 MG/5ML IV SOLN
INTRAVENOUS | Status: DC | PRN
  Administered 2021-04-29: 2 mg via INTRAVENOUS
  Administered 2021-04-29: 1 mg via INTRAVENOUS
  Administered 2021-04-29: 3 mg via INTRAVENOUS

## 2021-04-29 MED ORDER — HYDRALAZINE HCL 20 MG/ML IJ SOLN
INTRAMUSCULAR | Status: DC | PRN
  Administered 2021-04-29: 5 mg via INTRAVENOUS

## 2021-04-29 MED ORDER — FLUTICASONE PROPIONATE 50 MCG/ACT NA SUSP
2.0000 | Freq: Every day | NASAL | Status: DC | PRN
  Filled 2021-04-29: qty 16

## 2021-04-29 MED ORDER — CEFAZOLIN SODIUM-DEXTROSE 2-4 GM/100ML-% IV SOLN: 2.0000 g | Freq: Four times a day (QID) | INTRAVENOUS | Status: AC

## 2021-04-29 MED ORDER — DEXAMETHASONE SODIUM PHOSPHATE 10 MG/ML IJ SOLN
INTRAMUSCULAR | Status: DC | PRN
Start: 2021-04-29 — End: 2021-04-29
  Administered 2021-04-29: 10 mg via INTRAVENOUS

## 2021-04-29 MED ORDER — METOPROLOL SUCCINATE ER 25 MG PO TB24
25.0000 mg | ORAL_TABLET | Freq: Every day | ORAL | Status: DC
  Administered 2021-04-29 – 2021-05-05 (×7): 25 mg via ORAL
  Filled 2021-04-29 (×7): qty 1

## 2021-04-29 MED ORDER — LACTATED RINGERS IV SOLN: INTRAVENOUS | Status: DC | PRN

## 2021-04-29 MED ORDER — FAMOTIDINE 20 MG PO TABS
ORAL_TABLET | ORAL | Status: AC
  Administered 2021-04-29: 20 mg via ORAL
  Filled 2021-04-29: qty 1

## 2021-04-29 MED ORDER — BUPIVACAINE-EPINEPHRINE (PF) 0.25% -1:200000 IJ SOLN
INTRAMUSCULAR | Status: AC
  Filled 2021-04-29: qty 30

## 2021-04-29 MED ORDER — FENTANYL CITRATE (PF) 100 MCG/2ML IJ SOLN
INTRAMUSCULAR | Status: AC
  Administered 2021-04-29: 50 ug via INTRAVENOUS
  Filled 2021-04-29: qty 2

## 2021-04-29 MED ORDER — METHOCARBAMOL 500 MG PO TABS
ORAL_TABLET | ORAL | Status: AC
  Administered 2021-04-29: 500 mg via ORAL
  Filled 2021-04-29: qty 1

## 2021-04-29 MED ORDER — MORPHINE SULFATE (PF) 4 MG/ML IV SOLN
0.5000 mg | INTRAVENOUS | Status: DC | PRN
  Administered 2021-04-30: 1 mg via INTRAVENOUS

## 2021-04-29 MED ORDER — POVIDONE-IODINE 10 % EX SWAB
2.0000 "application " | Freq: Once | CUTANEOUS | Status: AC
  Administered 2021-04-29: 2 via TOPICAL

## 2021-04-29 MED ORDER — DOCUSATE SODIUM 100 MG PO CAPS
100.0000 mg | ORAL_CAPSULE | Freq: Two times a day (BID) | ORAL | Status: DC
  Administered 2021-05-01 – 2021-05-05 (×5): 100 mg via ORAL
  Filled 2021-04-29 (×7): qty 1

## 2021-04-29 MED ORDER — CEFAZOLIN SODIUM-DEXTROSE 2-4 GM/100ML-% IV SOLN
INTRAVENOUS | Status: AC
  Administered 2021-04-29: 2 g via INTRAVENOUS
  Filled 2021-04-29: qty 100

## 2021-04-29 MED ORDER — SODIUM CHLORIDE FLUSH 0.9 % IV SOLN
INTRAVENOUS | Status: AC
  Filled 2021-04-29: qty 40

## 2021-04-29 MED ORDER — PHENOL 1.4 % MT LIQD
1.0000 | OROMUCOSAL | Status: DC | PRN
  Filled 2021-04-29: qty 177

## 2021-04-29 MED ORDER — SEVOFLURANE IN SOLN
RESPIRATORY_TRACT | Status: AC
  Filled 2021-04-29: qty 250

## 2021-04-29 MED ORDER — ONDANSETRON HCL 4 MG PO TABS: 4.0000 mg | ORAL_TABLET | Freq: Four times a day (QID) | ORAL | Status: DC | PRN

## 2021-04-29 MED ORDER — DOCUSATE SODIUM 100 MG PO CAPS
ORAL_CAPSULE | ORAL | Status: AC
  Administered 2021-04-29: 100 mg via ORAL
  Filled 2021-04-29: qty 1

## 2021-04-29 MED ORDER — ONDANSETRON HCL 4 MG/2ML IJ SOLN: 4.0000 mg | Freq: Four times a day (QID) | INTRAMUSCULAR | Status: DC | PRN

## 2021-04-29 MED ORDER — SIMVASTATIN 20 MG PO TABS
40.0000 mg | ORAL_TABLET | Freq: Every day | ORAL | Status: DC
  Administered 2021-04-29 – 2021-05-04 (×6): 40 mg via ORAL
  Filled 2021-04-29: qty 2
  Filled 2021-04-29: qty 1
  Filled 2021-04-29 (×3): qty 2
  Filled 2021-04-29 (×2): qty 1

## 2021-04-29 MED ORDER — METHOCARBAMOL 1000 MG/10ML IJ SOLN
500.0000 mg | Freq: Four times a day (QID) | INTRAVENOUS | Status: DC | PRN
  Filled 2021-04-29: qty 5

## 2021-04-29 MED ORDER — ALBUTEROL SULFATE (2.5 MG/3ML) 0.083% IN NEBU: 3.0000 mL | INHALATION_SOLUTION | Freq: Four times a day (QID) | RESPIRATORY_TRACT | Status: DC | PRN

## 2021-04-29 MED ORDER — LORATADINE 10 MG PO TABS
10.0000 mg | ORAL_TABLET | Freq: Every day | ORAL | Status: DC
Start: 2021-04-29 — End: 2021-05-05
  Administered 2021-04-29 – 2021-05-05 (×6): 10 mg via ORAL
  Filled 2021-04-29 (×7): qty 1

## 2021-04-29 MED ORDER — FLUOCINONIDE 0.05 % EX CREA
1.0000 "application " | TOPICAL_CREAM | Freq: Two times a day (BID) | CUTANEOUS | Status: DC | PRN
  Filled 2021-04-29: qty 30

## 2021-04-29 SURGICAL SUPPLY — 58 items
BASEPLATE TIBIAL SZ 3 (Knees) IMPLANT
BLADE SAGITTAL AGGR TOOTH XLG (BLADE) ×2 IMPLANT
BLADE SAW SAG 25X90X1.19 (BLADE) ×2 IMPLANT
BOWL CEMENT MIX W/ADAPTER (MISCELLANEOUS) ×2 IMPLANT
BRUSH SCRUB EZ  4% CHG (MISCELLANEOUS) ×1
BRUSH SCRUB EZ 4% CHG (MISCELLANEOUS) ×1 IMPLANT
CEMENT BONE 40GM (Cement) ×4 IMPLANT
CHLORAPREP W/TINT 26 (MISCELLANEOUS) ×4 IMPLANT
COMP PATELLA FENESIS 32 OVAL (Stem) ×2 IMPLANT
COMPONENT PTLLA GENS 32 OVAL (Stem) IMPLANT
COOLER POLAR GLACIER W/PUMP (MISCELLANEOUS) ×2 IMPLANT
CUFF TOURN SGL QUICK 34 (TOURNIQUET CUFF) ×1
CUFF TRNQT CYL 34X4.125X (TOURNIQUET CUFF) ×1 IMPLANT
DRAPE 3/4 80X56 (DRAPES) ×4 IMPLANT
DRAPE INCISE IOBAN 66X60 STRL (DRAPES) ×2 IMPLANT
ELECT REM PT RETURN 9FT ADLT (ELECTROSURGICAL) ×2
ELECTRODE REM PT RTRN 9FT ADLT (ELECTROSURGICAL) ×1 IMPLANT
FEM COMP OXINIUM 5N RT NARROW (Orthopedic Implant) ×2 IMPLANT
FEMORAL COMP OXINIUM 5N RT NRW (Orthopedic Implant) IMPLANT
GAUZE SPONGE 4X4 12PLY STRL (GAUZE/BANDAGES/DRESSINGS) ×2 IMPLANT
GAUZE XEROFORM 1X8 LF (GAUZE/BANDAGES/DRESSINGS) ×2 IMPLANT
GLOVE SURG ENC MOIS LTX SZ8 (GLOVE) ×2 IMPLANT
GLOVE SURG ORTHO LTX SZ8.5 (GLOVE) ×6 IMPLANT
GLOVE SURG UNDER POLY LF SZ8.5 (GLOVE) ×4 IMPLANT
GOWN STRL REUS W/ TWL LRG LVL3 (GOWN DISPOSABLE) ×1 IMPLANT
GOWN STRL REUS W/ TWL XL LVL3 (GOWN DISPOSABLE) ×1 IMPLANT
GOWN STRL REUS W/TWL LRG LVL3 (GOWN DISPOSABLE) ×1
GOWN STRL REUS W/TWL XL LVL3 (GOWN DISPOSABLE) ×1
INSERT TIB XLPE 9 SZ 3-4 (Knees) ×1 IMPLANT
IV NS IRRIG 3000ML ARTHROMATIC (IV SOLUTION) ×2 IMPLANT
KIT TURNOVER KIT A (KITS) ×2 IMPLANT
MANIFOLD NEPTUNE II (INSTRUMENTS) ×2 IMPLANT
MAT ABSORB  FLUID 56X50 GRAY (MISCELLANEOUS) ×1
MAT ABSORB FLUID 56X50 GRAY (MISCELLANEOUS) ×1 IMPLANT
NDL SAFETY ECLIPSE 18X1.5 (NEEDLE) ×1 IMPLANT
NDL SPNL 20GX3.5 QUINCKE YW (NEEDLE) ×1 IMPLANT
NEEDLE HYPO 18GX1.5 SHARP (NEEDLE) ×1
NEEDLE SPNL 20GX3.5 QUINCKE YW (NEEDLE) ×2 IMPLANT
NS IRRIG 1000ML POUR BTL (IV SOLUTION) ×2 IMPLANT
PACK TOTAL KNEE (MISCELLANEOUS) ×2 IMPLANT
PAD DE MAYO PRESSURE PROTECT (MISCELLANEOUS) ×2 IMPLANT
PAD WRAPON POLAR KNEE (MISCELLANEOUS) ×1 IMPLANT
PULSAVAC PLUS IRRIG FAN TIP (DISPOSABLE) ×2
SOLUTION PRONTOSAN WOUND 350ML (IRRIGATION / IRRIGATOR) ×2 IMPLANT
STAPLER SKIN PROX 35W (STAPLE) ×2 IMPLANT
SUCTION FRAZIER HANDLE 10FR (MISCELLANEOUS) ×1
SUCTION TUBE FRAZIER 10FR DISP (MISCELLANEOUS) ×1 IMPLANT
SUT DVC 2 QUILL PDO  T11 36X36 (SUTURE) ×1
SUT DVC 2 QUILL PDO T11 36X36 (SUTURE) ×1 IMPLANT
SUT VIC AB 2-0 CT1 18 (SUTURE) ×2 IMPLANT
SUT VIC AB 2-0 CT1 27 (SUTURE)
SUT VIC AB 2-0 CT1 TAPERPNT 27 (SUTURE) IMPLANT
SUT VIC AB PLUS 45CM 1-MO-4 (SUTURE) ×2 IMPLANT
SYR 30ML LL (SYRINGE) ×6 IMPLANT
TIBIAL BASEPLATE SZ 3 (Knees) ×2 IMPLANT
TIP FAN IRRIG PULSAVAC PLUS (DISPOSABLE) ×1 IMPLANT
WATER STERILE IRR 1000ML POUR (IV SOLUTION) ×1 IMPLANT
WRAPON POLAR PAD KNEE (MISCELLANEOUS) ×2

## 2021-04-29 NOTE — Anesthesia Procedure Notes (Signed)
Procedure Name: Intubation Date/Time: 04/29/2021 2:11 PM Performed by: Beverely Low, CRNA Pre-anesthesia Checklist: Patient identified, Patient being monitored, Timeout performed, Emergency Drugs available and Suction available Patient Re-evaluated:Patient Re-evaluated prior to induction Oxygen Delivery Method: Circle system utilized Preoxygenation: Pre-oxygenation with 100% oxygen Induction Type: IV induction Ventilation: Mask ventilation without difficulty Laryngoscope Size: 3 and Glidescope Grade View: Grade I Tube type: Oral Tube size: 7.0 mm Number of attempts: 1 Placement Confirmation: ETT inserted through vocal cords under direct vision, positive ETCO2 and breath sounds checked- equal and bilateral Secured at: 21 cm Tube secured with: Tape Dental Injury: Teeth and Oropharynx as per pre-operative assessment

## 2021-04-29 NOTE — Anesthesia Preprocedure Evaluation (Addendum)
Anesthesia Evaluation  Patient identified by MRN, date of birth, ID band Patient awake    Reviewed: Allergy & Precautions, NPO status , Patient's Chart, lab work & pertinent test results  History of Anesthesia Complications Negative for: history of anesthetic complications  Airway Mallampati: III  TM Distance: <3 FB Neck ROM: full    Dental  (+) Chipped, Poor Dentition, Missing, Upper Dentures   Pulmonary neg shortness of breath, asthma ,    Pulmonary exam normal        Cardiovascular Exercise Tolerance: Good hypertension, (-) angina(-) Past MI Normal cardiovascular exam     Neuro/Psych PSYCHIATRIC DISORDERS negative neurological ROS  negative psych ROS   GI/Hepatic Neg liver ROS, GERD  Medicated and Controlled,  Endo/Other  negative endocrine ROS  Renal/GU CRFRenal disease     Musculoskeletal  (+) Arthritis ,   Abdominal   Peds  Hematology negative hematology ROS (+)   Anesthesia Other Findings Past Medical History: No date: Arthritis No date: Asthma 01/17/2018: Basal cell carcinoma     Comment:  Left chest. Infiltrative. Excised 02/06/2018, margins               free. 11/23/2012: Cystic teratoma of right ovary     Comment:  She underwent right salpingo-oophorectomy on April 1 by               Dr. Kenton Kingfisher for removal of what turned out to be a cystic               teratoma. Hysteroscopy was also done at the time due to               postmenopausal bleeding. It is unclear  why the left               ovary was not taken out since she is postmenopausal.  No date: Dyspnea No date: GERD (gastroesophageal reflux disease) No date: Granuloma annulare No date: Hyperlipidemia No date: Hypertension No date: Menopause 12/24/2014: Plantar fasciitis of right foot 10/08/2020: SCC (squamous cell carcinoma)     Comment:  R mid lower pretibia, EDC 03/16/2021: SCC (squamous cell carcinoma)     Comment:  R upper pretibial,  needs EDC pt sched 06/02/21 02/26/2020: Squamous cell carcinoma of skin     Comment:  R lower pretibial, clear with bx 04/01/20 07/15/2020: Squamous cell carcinoma of skin     Comment:  R mid pretibia, [facility] 12/09/2020: Squamous cell carcinoma of skin     Comment:  R lateral pretibia, EDC No date: Stage 3b chronic kidney disease (CKD) ([place])  Past Surgical History: No date: APPENDECTOMY No date: CATARACT EXTRACTION, BILATERAL No date: DILATION AND CURETTAGE OF UTERUS 04/08/2012: RIGHT OOPHORECTOMY; Right     Comment:  [name] 03/08/2008: SPINE SURGERY No date: VENOUS ABLATION     Comment:  bilateral     Reproductive/Obstetrics negative OB ROS                             Anesthesia Physical Anesthesia Plan  ASA: 3  Anesthesia Plan: General ETT   Post-op Pain Management: Regional block*   Induction: Intravenous  PONV Risk Score and Plan: Ondansetron, Dexamethasone, Midazolam and Treatment may vary due to age or medical condition  Airway Management Planned: Oral ETT  Additional Equipment:   Intra-op Plan:   Post-operative Plan: Extubation in OR  Informed Consent: I have reviewed the patients History and Physical, chart, labs and discussed  the procedure including the risks, benefits and alternatives for the proposed anesthesia with the patient or authorized representative who has indicated his/her understanding and acceptance.     Dental Advisory Given  Plan Discussed with: Anesthesiologist, CRNA and Surgeon  Anesthesia Plan Comments: (Patient declines neuraxial 2/2 her lumbar histroy  Patient consented for risks of anesthesia including but not limited to:  - adverse reactions to medications - damage to eyes, teeth, lips or other oral mucosa - nerve damage due to positioning  - sore throat or hoarseness - Damage to heart, brain, nerves, lungs, other parts of body or loss of life  Patient voiced understanding.)       Anesthesia Quick  Evaluation

## 2021-04-29 NOTE — Op Note (Signed)
DATE OF SURGERY:  04/29/2021 TIME: 4:04 PM  PATIENT NAME:  Jamie Anderson   AGE: 86 y.o.    PRE-OPERATIVE DIAGNOSIS:  M17.11 Unilateral primary osteoarthritis, right knee  POST-OPERATIVE DIAGNOSIS:  Same  PROCEDURE:  Procedure(s): TOTAL KNEE ARTHROPLASTY, RIGHT  SURGEON:  Lovell Sheehan, MD   ASSISTANT:  [name],  PA-C  OPERATIVE IMPLANTS: Tamala [employer], Cruciate Retaining Oxinium Femoral component size 5N, Fixed Bearing Tray size 3, Patella polyethylene 3-peg oval button size 32 mm, with a 9 mm DISHED insert.   PREOPERATIVE INDICATIONS:  Jamie Anderson is an 86 y.o. female who has a diagnosis of M17.11 Unilateral primary osteoarthritis, right knee and elected for a total knee arthroplasty after failing nonoperative treatment, including activity modification, pain medication, physical therapy and injections who has significant impairment of their activities of daily living.  Radiographs have demonstrated tricompartmental osteoarthritis joint space narrowing, osteophytes, subchondral sclerosis and cyst formation.  The risks, benefits, and alternatives were discussed at length including but not limited to the risks of infection, bleeding, nerve or blood vessel injury, knee stiffness, fracture, dislocation, loosening or failure of the hardware and the need for further surgery. Medical risks include but not limited to DVT and pulmonary embolism, myocardial infarction, stroke, pneumonia, respiratory failure and death. I discussed these risks with the patient in my office prior to the date of surgery. They understood these risks and were willing to proceed.  OPERATIVE FINDINGS AND UNIQUE ASPECTS OF THE CASE:  All three compartments with advanced and severe degenerative changes, large osteophytes and an abundance of synovial fluid. Significant deformity was also noted. A decision was made to proceed with total knee arthroplasty.   OPERATIVE DESCRIPTION:  The patient was brought to the  operative room and placed in a supine position after undergoing placement of a general anesthetic. IV antibiotics were given. Patient received tranexamic acid. The lower extremity was prepped and draped in the usual sterile fashion.  A time out was performed to verify the patient's name, date of birth, medical record number, correct site of surgery and correct procedure to be performed. The timeout was also used to confirm the patient received antibiotics and that appropriate instruments, implants and radiographs studies were available in the room.  The leg was elevated and exsanguinated with an Esmarch and the tourniquet was inflated to 300 mmHg.  A midline incision was made over the left knee.. A medial parapatellar arthrotomy was then made and the patella subluxed laterally and the knee was brought into 90 of flexion. Hoffa's fat pad along with the anterior cruciate ligament was resected and the medial joint line was exposed.  Attention was then turned to preparation of the patella. The thickness of the patella was measured with a caliper, the diameter measured with the patella templates.  The patella resection was then made with an oscillating saw using the patella cutting guide.  The 32 mm button fit appropriately.  3 peg holes for the patella component were then drilled.  The extramedullary tibial cutting guide was then placed using the anterior tibial crest and second ray of the foot as a reference.  The tibial cutting guide was adjusted to allow for appropriate posterior slope.  The tibial cutting block was pinned into position. The slotted stylus was used to measure the proximal tibial resection of 9 mm off the high lateral side. Care was taken during the tibial resection to protect the medial and collateral ligaments.  The resected tibial bone was removed.  The distal femur was resected using the intramedullary cutting guide.  Care was taken to protect the collateral ligaments during distal  femoral resection.  The distal femoral resection was performed with an oscillating saw. The femoral cutting guide was then removed. Extension gap was measured with a 9 mm spacer block and alignment and extension was confirmed using a long alignment rod. The femur was sized to be a 5 narrow. Rotation of the referencing guide was checked with the epicondylar axis and Whitesides line. Then the 4-in-1 cutting jig was then applied to the distal femur. A stylus was used to confirm that the anterior femur would not be notched.   Then the anterior, posterior and chamfer femoral cuts were then made with an oscillating saw.  The knee was distracted and all posterior osteophytes were removed.  The flexion gap was then measured with a flexion spacer block and long alignment rod and was found to be symmetric with the extension gap and perpendicular to mechanical axis of the tibia.  The proximal tibia plateau was then sized with trial trays. The best coverage was achieved with a size 3. This tibial tray was then pinned into position. The proximal tibia was then prepared with the keel punch.  After tibial preparation was completed, all trial components were inserted with polyethylene trials. The knee achieved full extension and flexed to 120 degrees. Ligament were stable to varus and valgus at full extension as well as 30, 60 and 90 degrees of flexion.   The trials were then placed. Knee was taken through a full range of motion and deemed to be stable with the trial components. All trial components were then removed.  The joint was copiously irrigated with pulse lavage.  The final total knee arthroplasty components were then cemented into place. The knee was held in extension while cement was allowed to cure.The knee was taken through a range of motion and the patella tracked well and the knee was again irrigated copiously.  The knee capsule was then injected with Exparel.  The medial arthrotomy was closed with #1 Vicryl  and #2 Quill. The subcutaneous tissue closed with  2-0 vicryl, and skin approximated with staples.  A dry sterile and compressive dressing was applied.  A Polar Care was applied to the operative knee.  The patient was awakened and brought to the PACU in stable and satisfactory condition.  All sharp, lap and instrument counts were correct at the conclusion the case. I spoke with the patient's family in the postop consultation room to let them know the case had been performed without complication and the patient was stable in recovery room.   Total tourniquet time was 26 minutes.

## 2021-04-29 NOTE — Anesthesia Procedure Notes (Signed)
Anesthesia Regional Block: Adductor canal block   Pre-Anesthetic Checklist: , timeout performed,  Correct Patient, Correct Site, Correct Laterality,  Correct Procedure, Correct Position, site marked,  Risks and benefits discussed,  Surgical consent,  Pre-op evaluation,  At surgeon's request and post-op pain management  Laterality: Lower and Right  Prep: chloraprep       Needles:  Injection technique: Single-shot  Needle Type: Echogenic Needle     Needle Length: 9cm  Needle Gauge: 21     Additional Needles:   Procedures:,,,, ultrasound used (permanent image in chart),,    Narrative:  Start time: 04/29/2021 1:53 PM End time: 04/29/2021 1:54 PM Injection made incrementally with aspirations every 5 mL.  Performed by: Personally  Anesthesiologist: Lessie Funderburke, Precious Haws, MD  Additional Notes: Patient consented for risk and benefits of nerve block including but not limited to nerve damage, failed block, bleeding and infection.  Patient voiced understanding.  Functioning IV was confirmed and monitors were applied.  Timeout done prior to procedure and prior to any sedation being given to the patient.  Patient confirmed procedure site prior to any sedation given to the patient.  A 7mm 22ga Stimuplex needle was used. Sterile prep,hand hygiene and sterile gloves were used.  Minimal sedation used for procedure.  No paresthesia endorsed by patient during the procedure.  Negative aspiration and negative test dose prior to incremental administration of local anesthetic. The patient tolerated the procedure well with no immediate complications.

## 2021-04-29 NOTE — Transfer of Care (Signed)
Immediate Anesthesia Transfer of Care Note  Patient: MARNEY TRELOAR  Procedure(s) Performed: TOTAL KNEE ARTHROPLASTY (Right: Knee)  Patient Location: PACU  Anesthesia Type:General  Level of Consciousness: sedated  Airway & Oxygen Therapy: Patient Spontanous Breathing and Patient connected to face mask oxygen  Post-op Assessment: Report given to RN and Post -op Vital signs reviewed and stable  Post vital signs: Reviewed and stable  Last Vitals:  Vitals Value Taken Time  BP 159/41 04/29/21 1602  Temp    Pulse 72 04/29/21 1609  Resp 15 04/29/21 1609  SpO2 99 % 04/29/21 1609  Vitals shown include unvalidated device data.  Last Pain:  Vitals:   04/29/21 1327  PainSc: 4          Complications: No notable events documented.

## 2021-04-29 NOTE — H&P (Signed)
The patient has been re-examined, and the chart reviewed, and there have been no interval changes to the documented history and physical.  Plan a right total knee today.  Anesthesia is consulted regarding a peripheral nerve block for post-operative pain.  The risks, benefits, and alternatives have been discussed at length, and the patient is willing to proceed.     

## 2021-04-30 ENCOUNTER — Encounter: Payer: Self-pay | Admitting: Orthopedic Surgery

## 2021-04-30 LAB — BASIC METABOLIC PANEL
Anion gap: 7 (ref 5–15)
BUN: 19 mg/dL (ref 8–23)
CO2: 24 mmol/L (ref 22–32)
Calcium: 9.1 mg/dL (ref 8.9–10.3)
Chloride: 104 mmol/L (ref 98–111)
Creatinine, Ser: 1.12 mg/dL — ABNORMAL HIGH (ref 0.44–1.00)
GFR, Estimated: 49 mL/min — ABNORMAL LOW (ref >60)
Glucose, Bld: 135 mg/dL — ABNORMAL HIGH (ref 70–99)
Potassium: 4.5 mmol/L (ref 3.5–5.1)
Sodium: 135 mmol/L (ref 135–145)

## 2021-04-30 LAB — CBC
HCT: 32.8 % — ABNORMAL LOW (ref 36.0–46.0)
Hemoglobin: 10.6 g/dL — ABNORMAL LOW (ref 12.0–15.0)
MCH: 28.1 pg (ref 26.0–34.0)
MCHC: 32.3 g/dL (ref 30.0–36.0)
MCV: 87 fL (ref 80.0–100.0)
Platelets: 167 10*3/uL (ref 150–400)
RBC: 3.77 MIL/uL — ABNORMAL LOW (ref 3.87–5.11)
RDW: 14.7 % (ref 11.5–15.5)
WBC: 12.6 10*3/uL — ABNORMAL HIGH (ref 4.0–10.5)
nRBC: 0 % (ref 0.0–0.2)

## 2021-04-30 MED ORDER — KETOROLAC TROMETHAMINE 15 MG/ML IJ SOLN
INTRAMUSCULAR | Status: AC
  Administered 2021-04-30: 7.5 mg via INTRAVENOUS
  Filled 2021-04-30: qty 1

## 2021-04-30 MED ORDER — MORPHINE SULFATE (PF) 2 MG/ML IV SOLN
INTRAVENOUS | Status: AC
  Filled 2021-04-30: qty 1

## 2021-04-30 MED ORDER — KETOROLAC TROMETHAMINE 15 MG/ML IJ SOLN
INTRAMUSCULAR | Status: AC
Start: 2021-04-30 — End: 2021-04-30
  Administered 2021-04-30: 7.5 mg via INTRAVENOUS
  Filled 2021-04-30: qty 1

## 2021-04-30 MED ORDER — LACTATED RINGERS IV SOLN: INTRAVENOUS | Status: DC

## 2021-04-30 MED ORDER — METHOCARBAMOL 500 MG PO TABS
ORAL_TABLET | ORAL | Status: AC
  Filled 2021-04-30: qty 1

## 2021-04-30 MED ORDER — DOCUSATE SODIUM 100 MG PO CAPS
ORAL_CAPSULE | ORAL | Status: AC
  Administered 2021-04-30: 100 mg via ORAL
  Filled 2021-04-30: qty 1

## 2021-04-30 MED ORDER — CEFAZOLIN SODIUM-DEXTROSE 2-4 GM/100ML-% IV SOLN
INTRAVENOUS | Status: AC
  Administered 2021-04-30: 2 g via INTRAVENOUS
  Filled 2021-04-30: qty 100

## 2021-04-30 MED ORDER — HYDROCODONE-ACETAMINOPHEN 5-325 MG PO TABS
ORAL_TABLET | ORAL | Status: AC
  Administered 2021-04-30: 2 via ORAL
  Filled 2021-04-30: qty 2

## 2021-04-30 MED ORDER — HYDROCODONE-ACETAMINOPHEN 5-325 MG PO TABS
ORAL_TABLET | ORAL | Status: AC
  Filled 2021-04-30: qty 2

## 2021-04-30 MED ORDER — PANTOPRAZOLE SODIUM 40 MG PO TBEC
DELAYED_RELEASE_TABLET | ORAL | Status: AC
  Administered 2021-04-30: 40 mg via ORAL
  Filled 2021-04-30: qty 1

## 2021-04-30 MED ORDER — METHOCARBAMOL 500 MG PO TABS
ORAL_TABLET | ORAL | Status: AC
Start: 2021-04-30 — End: 2021-04-30
  Filled 2021-04-30: qty 1

## 2021-04-30 MED ORDER — ASPIRIN 81 MG PO CHEW
CHEWABLE_TABLET | ORAL | Status: AC
Start: 2021-04-30 — End: 2021-04-30
  Administered 2021-04-30: 81 mg via ORAL
  Filled 2021-04-30: qty 1

## 2021-04-30 MED ORDER — ASPIRIN 81 MG PO CHEW
CHEWABLE_TABLET | ORAL | Status: AC
  Administered 2021-04-30: 81 mg via ORAL
  Filled 2021-04-30: qty 1

## 2021-04-30 NOTE — NC FL2 (Signed)
[place] LEVEL OF CARE SCREENING TOOL     IDENTIFICATION  Patient Name: Jamie Anderson Birthdate: 1936/01/11 Sex: female Admission Date (Current Location): 04/29/2021  [place] and [place] Number:  Engineering geologist and Address:  [facility], [address], [place], [place] [address]      Provider Number: [id number]  Attending Physician Name and Address:  Lovell Sheehan, MD  Relative Name and Phone Number:  Augustin Coupe    Current Level of Care: Hospital Recommended Level of Care: [place] Prior Approval Number:    Date Approved/Denied:   PASRR Number:    Discharge Plan: SNF    Current Diagnoses: Patient Active Problem List   Diagnosis Date Noted   S/P TKR (total knee replacement) using cement, right 04/29/2021   Bronchiectasis without complication ([place]) 38/12/1749   Preop pulmonary/respiratory exam 03/24/2021   Low oxygen saturation 02/16/2021   Wheezing 08/18/2020   Varicose veins of both lower extremities with complications 02/58/5277   Degenerative lumbar spinal stenosis 03/15/2018   CKD (chronic kidney disease) stage 3, GFR 30-59 ml/min ([place]) 03/15/2018   Atypical chest pain 01/22/2017   Aortic atherosclerosis ([place]) 01/22/2017   Post-menopausal atrophic vaginitis 01/18/2016   Encounter for preventive health examination 01/18/2016   Colon cancer screening 01/16/2015   Upper respiratory tract infection 04/09/2014   Obesity 12/31/2012   Obsessive compulsive disorder 12/29/2012   Gout 04/13/2012   Right knee DJD 04/03/2012   Medicare annual wellness visit, subsequent 11/26/2011   Hyperlipidemia LDL goal <70 05/25/2011   Primary hypertension 05/25/2011   Major depressive disorder, single episode, mild ([place]) 05/25/2011    Orientation RESPIRATION BLADDER Height & Weight     Self, Time, Situation, Place  Normal Continent Weight: 85.7 kg Height:  5\' 4"  (162.6 cm)  BEHAVIORAL  SYMPTOMS/MOOD NEUROLOGICAL BOWEL NUTRITION STATUS      Continent    AMBULATORY STATUS COMMUNICATION OF NEEDS Skin   Limited Assist Verbally Normal                       Personal Care Assistance Level of Assistance  Bathing, Feeding, Dressing Bathing Assistance: Limited assistance Feeding assistance: Limited assistance Dressing Assistance: Limited assistance     Functional Limitations Info             [place]  PT (By licensed PT), OT (By licensed OT)     PT Frequency: min 5xweek OT Frequency: min 5xweek            Contractures      Additional Factors Info                  Current Medications (04/30/2021):  This is the current hospital active medication list Current Facility-Administered Medications  Medication Dose Route Frequency Provider Last Rate Last Admin   acetaminophen (TYLENOL) tablet 325-650 mg  325-650 mg Oral Q6H PRN Lovell Sheehan, MD       albuterol (PROVENTIL) (2.5 MG/3ML) 0.083% nebulizer solution 3 mL  3 mL Inhalation Q6H PRN Lovell Sheehan, MD       allopurinol (ZYLOPRIM) tablet 100 mg  100 mg Oral Daily Lovell Sheehan, MD   100 mg at 04/30/21 1043   alum & mag hydroxide-simeth (MAALOX/MYLANTA) 200-200-20 MG/5ML suspension 30 mL  30 mL Oral Q4H PRN Lovell Sheehan, MD       aspirin chewable tablet 81 mg  81 mg Oral BID Lovell Sheehan,  MD   81 mg at 04/30/21 1046   bisacodyl (DULCOLAX) suppository 10 mg  10 mg Rectal Daily PRN Lovell Sheehan, MD       calcium carbonate (TUMS - dosed in mg elemental calcium) chewable tablet 200 mg of elemental calcium  1 tablet Oral Daily PRN Lovell Sheehan, MD       diphenhydrAMINE (BENADRYL) 12.5 MG/5ML elixir 12.5-25 mg  12.5-25 mg Oral Q4H PRN Lovell Sheehan, MD   25 mg at 04/29/21 2230   docusate sodium (COLACE) capsule 100 mg  100 mg Oral BID Lovell Sheehan, MD   100 mg at 04/30/21 1045   ezetimibe (ZETIA) tablet 10 mg  10 mg Oral Daily Lovell Sheehan, MD   10 mg at 04/30/21  1043   fluocinonide cream (LIDEX) 8.54 % 1 application  1 application Topical BID PRN Lovell Sheehan, MD       fluticasone (FLONASE) 50 MCG/ACT nasal spray 2 spray  2 spray Each Nare Daily PRN Lovell Sheehan, MD       HYDROcodone-acetaminophen (NORCO) 7.5-325 MG per tablet 1-2 tablet  1-2 tablet Oral Q4H PRN Lovell Sheehan, MD       HYDROcodone-acetaminophen (NORCO/VICODIN) 5-325 MG per tablet 1-2 tablet  1-2 tablet Oral Q4H PRN Lovell Sheehan, MD   2 tablet at 04/30/21 1655   irbesartan (AVAPRO) tablet 300 mg  300 mg Oral Daily Lovell Sheehan, MD   300 mg at 04/30/21 1041   loratadine (CLARITIN) tablet 10 mg  10 mg Oral Daily Lovell Sheehan, MD   10 mg at 04/29/21 2050   menthol-cetylpyridinium (CEPACOL) lozenge 3 mg  1 lozenge Oral PRN Lovell Sheehan, MD       Or   phenol (CHLORASEPTIC) mouth spray 1 spray  1 spray Mouth/Throat PRN Lovell Sheehan, MD       methocarbamol (ROBAXIN) tablet 500 mg  500 mg Oral Q6H PRN Lovell Sheehan, MD   500 mg at 04/30/21 0346   Or   methocarbamol (ROBAXIN) 500 mg in dextrose 5 % 50 mL IVPB  500 mg Intravenous Q6H PRN Lovell Sheehan, MD       metoCLOPramide (REGLAN) tablet 5-10 mg  5-10 mg Oral Q8H PRN Lovell Sheehan, MD       Or   metoCLOPramide (REGLAN) injection 5-10 mg  5-10 mg Intravenous Q8H PRN Lovell Sheehan, MD       metoprolol succinate (TOPROL-XL) 24 hr tablet 25 mg  25 mg Oral Daily Lovell Sheehan, MD   25 mg at 04/30/21 1044   mometasone-formoterol (DULERA) 100-5 MCG/ACT inhaler 2 puff  2 puff Inhalation BID PRN Lovell Sheehan, MD       morphine (PF) 4 MG/ML injection 0.52-1 mg  0.52-1 mg Intravenous Q2H PRN Lovell Sheehan, MD   1 mg at 04/30/21 0346   ondansetron (ZOFRAN) tablet 4 mg  4 mg Oral Q6H PRN Lovell Sheehan, MD       Or   ondansetron [facility]) injection 4 mg  4 mg Intravenous Q6H PRN Lovell Sheehan, MD       pantoprazole (PROTONIX) EC tablet 40 mg  40 mg Oral Daily Lovell Sheehan, MD   40 mg at 04/30/21 0846    sertraline (ZOLOFT) tablet 100 mg  100 mg Oral Daily Lovell Sheehan, MD   100 mg at 04/30/21 1044   simvastatin (ZOCOR) tablet 40 mg  40  mg Oral QHS Lovell Sheehan, MD   40 mg at 04/29/21 2050     Discharge Medications: Please see discharge summary for a list of discharge medications.  Relevant Imaging Results:  Relevant Lab Results:   Additional Information [id number]  Anselm Pancoast, RN

## 2021-04-30 NOTE — Evaluation (Signed)
Physical Therapy Evaluation Patient Details Name: Jamie Anderson MRN: [id number] DOB: [birth year] Today's Date: 04/30/2021  History of Present Illness  Pt is an 86 y.o. female s/p R TKA 04/29/21 secondary to unilateral primary OA R knee.  PMH includes CKD, atypical chest pain, OCD, gout, SCC, htn, asthma, and spine surgery.  Clinical Impression  Prior to surgery, pt was independent with functional mobility; lives alone in 1 level home (level entry); plans to go to rehab facility for therapy and then discharge to her daughter's home (pt can stay on main level with 4 STE R railing); pt does not have a lot of assist available upon discharge d/t family working (pt would be alone during the day).  3-4/10 R knee pain at rest beginning of session and 5/10 at rest end of session (pt also reporting h/o back pain and reporting intermittent back pain during session but did not rate when asked).  Able to perform R LE SLR independently; R knee AROM 0-85 degrees.  Currently pt is SBA semi-supine to sitting edge of bed; CGA with transfers using RW; and CGA to ambulate 40 feet with RW.  Pt would benefit from skilled PT to address noted impairments and functional limitations (see below for any additional details).  Upon hospital discharge, pt currently would benefit from SNF for continued therapy services.  Will monitor pt's functional progress and pain control and will update PT recommendations as deemed appropriate.    Recommendations for follow up therapy are one component of a multi-disciplinary discharge planning process, led by the attending physician.  Recommendations may be updated based on patient status, additional functional criteria and insurance authorization.  Follow Up Recommendations Skilled nursing-short term rehab (<3 hours/day)    Assistance Recommended at Discharge Intermittent Supervision/Assistance  Patient can return home with the following  A little help with walking and/or transfers;A little  help with bathing/dressing/bathroom;Assistance with cooking/housework;Assist for transportation;Help with stairs or ramp for entrance    Equipment Recommendations Rolling walker (2 wheels);BSC/3in1  Recommendations for Other Services  OT consult    Functional Status Assessment Patient has had a recent decline in their functional status and demonstrates the ability to make significant improvements in function in a reasonable and predictable amount of time.     Precautions / Restrictions Precautions Precautions: Knee;Fall Precaution Booklet Issued: Yes (comment) Restrictions Weight Bearing Restrictions: Yes RLE Weight Bearing: Weight bearing as tolerated      Mobility  Bed Mobility Overal bed mobility: Needs Assistance Bed Mobility: Supine to Sit     Supine to sit: Supervision, HOB elevated     General bed mobility comments: SBA for lines; mild increased effort to perform on own    Transfers Overall transfer level: Needs assistance Equipment used: Rolling walker (2 wheels) Transfers: Sit to/from Stand, Bed to chair/wheelchair/BSC Sit to Stand: Min guard   Step pivot transfers: Min guard       General transfer comment: vc's for UE/LE placement and walker use    Ambulation/Gait Ambulation/Gait assistance: Min guard Gait Distance (Feet): 40 Feet Assistive device: Rolling walker (2 wheels) Gait Pattern/deviations: Decreased step length - right, Decreased step length - left Gait velocity: decreased     General Gait Details: antalgic; decreased stance time R LE; intermittent vc's for gait technique and walker use; steady  Stairs            Wheelchair Mobility    Modified Rankin (Stroke Patients Only)       Balance Overall balance assessment: Needs assistance Sitting-balance  support: No upper extremity supported, Feet supported Sitting balance-Leahy Scale: Normal Sitting balance - Comments: steady sitting reaching outside BOS   Standing balance  support: No upper extremity supported Standing balance-Leahy Scale: Good Standing balance comment: steady standing reaching within BOS                             Pertinent Vitals/Pain Pain Assessment Pain Assessment: 0-10 Pain Score: 5  Pain Location: R knee Pain Descriptors / Indicators: Sore Pain Intervention(s): Limited activity within patient's tolerance, Monitored during session, Premedicated before session, Repositioned, Other (comment) (polar care applied) Vitals (HR and O2 on room air) stable and WFL throughout treatment session.    Home Living Family/patient expects to be discharged to:: Private residence Living Arrangements: Alone (plan to discharge to daughter's home after SNF stay) Available Help at Discharge:  (family works and unable to assist pt much) Type of Home: House (following home set-up is daughter's home set up (pt reports living alone with her cat in 1 level home with level entry)) Home Access: Stairs to enter Entrance Stairs-Rails: Right Entrance Stairs-Number of Steps: [place]: Two level;Able to live on main level with bedroom/bathroom Home Equipment: Rolling Walker (2 wheels);Cane - single point;Hand held shower head;Shower seat - built in      Prior Function Prior Level of Function : Independent/Modified Independent             Mobility Comments: No recent falls       Hand Dominance        Extremity/Trunk Assessment   Upper Extremity Assessment Upper Extremity Assessment: Overall WFL for tasks assessed    Lower Extremity Assessment Lower Extremity Assessment: RLE deficits/detail (L LE WFL) RLE Deficits / Details: at least 3/5 AROM hip flexion and ankle DF/PF; able to perform R LE SLR independently RLE: Unable to fully assess due to pain    Cervical / Trunk Assessment Cervical / Trunk Assessment: Other exceptions Cervical / Trunk Exceptions: forward head/shoulders  Communication   Communication: No difficulties   Cognition Arousal/Alertness: Awake/alert Behavior During Therapy: WFL for tasks assessed/performed Overall Cognitive Status: Within Functional Limits for tasks assessed                                          General Comments  Nursing cleared pt for participation in physical therapy.  Pt agreeable to PT session.  Pt's daughter present beginning and end of session (left during session).     Exercises Total Joint Exercises Ankle Circles/Pumps: AROM, Strengthening, Both, 10 reps, Supine Quad Sets: AROM, Strengthening, Both, 10 reps, Supine Short Arc Quad: AAROM, Strengthening, Right, 10 reps, Supine Heel Slides: AAROM, Strengthening, Right, 10 reps, Supine Hip ABduction/ADduction: AAROM, Strengthening, Right, 10 reps, Supine Straight Leg Raises: AROM, Strengthening, Right, 10 reps, Supine Goniometric ROM: R knee AROM 0-85 degrees   Assessment/Plan    PT Assessment Patient needs continued PT services  PT Problem List Decreased strength;Decreased range of motion;Decreased activity tolerance;Decreased balance;Decreased mobility;Decreased knowledge of use of DME;Decreased knowledge of precautions;Pain;Decreased skin integrity       PT Treatment Interventions DME instruction;Gait training;Stair training;Functional mobility training;Therapeutic activities;Therapeutic exercise;Balance training;Patient/family education    PT Goals (Current goals can be found in the Care Plan section)  Acute Rehab PT Goals Patient Stated Goal: to improve strength and mobility PT Goal Formulation: With  patient Time For Goal Achievement: 05/14/21 Potential to Achieve Goals: Good    Frequency BID     Co-evaluation               AM-PAC PT "6 Clicks" Mobility  Outcome Measure Help needed turning from your back to your side while in a flat bed without using bedrails?: None Help needed moving from lying on your back to sitting on the side of a flat bed without using bedrails?: A  Little Help needed moving to and from a bed to a chair (including a wheelchair)?: A Little Help needed standing up from a chair using your arms (e.g., wheelchair or bedside chair)?: A Little Help needed to walk in hospital room?: A Little Help needed climbing 3-5 steps with a railing? : A Little 6 Click Score: 19    End of Session Equipment Utilized During Treatment: Gait belt Activity Tolerance: Patient tolerated treatment well Patient left: in chair;with call bell/phone within reach;with family/visitor present;with SCD's reapplied;Other (comment) (B heels floating via towel rolls; polar care in place) Nurse Communication: Mobility status;Precautions;Weight bearing status PT Visit Diagnosis: Other abnormalities of gait and mobility (R26.89);Muscle weakness (generalized) (M62.81);Difficulty in walking, not elsewhere classified (R26.2);Pain Pain - Right/Left: Right Pain - part of body: Knee    Time: 5093-2671 PT Time Calculation (min) (ACUTE ONLY): 73 min   Charges:   PT Evaluation $PT Eval Low Complexity: 1 Low PT Treatments $Gait Training: 8-22 mins $Therapeutic Exercise: 8-22 mins $Therapeutic Activity: 8-22 mins       Leitha Bleak, PT 04/30/21, 11:11 AM

## 2021-04-30 NOTE — TOC Initial Note (Addendum)
Transition of Care [facility]) - Initial/Assessment Note    Patient Details  Name: Jamie Anderson MRN: [id number] Date of Birth: December 01, 1935  Transition of Care [facility]) CM/SW Contact:    Anselm Pancoast, RN Phone Number: 04/30/2021, 5:16 PM  Clinical Narrative:                 Damaris Schooner to daughter, Jeannene Patella who reports patient is hopeful to discharge to [facility] for rehab.. Patient lives home alone and has strong family support however family does work during the day. Daughter understands need for insurance approval.   PASSR completed: [id number]  Expected Discharge Plan: [place] Barriers to Discharge: No Barriers Identified   Patient Goals and CMS Choice Patient states their goals for this hospitalization and ongoing recovery are:: Go to rehab   Choice offered to / list presented to : Adult Children  Expected Discharge Plan and Services Expected Discharge Plan: [place] Acute Care Choice: [place] Living arrangements for the past 2 months: Single Family Home                                      Prior Living Arrangements/Services Living arrangements for the past 2 months: Single Family Home Lives with:: Self Patient language and need for interpreter reviewed:: Yes Do you feel safe going back to the place where you live?: Yes      Need for Family Participation in Patient Care: Yes (Comment) Care giver support system in place?: Yes (comment)   Criminal Activity/Legal Involvement Pertinent to Current Situation/Hospitalization: No - Comment as needed  Activities of Daily Living Home Assistive Devices/Equipment: Gilford Rile (specify type) ADL Screening (condition at time of admission) Patient's cognitive ability adequate to safely complete daily activities?: No Is the patient deaf or have difficulty hearing?: No Does the patient have difficulty seeing, even when wearing glasses/contacts?: No Does the patient have difficulty  concentrating, remembering, or making decisions?: No Patient able to express need for assistance with ADLs?: Yes Does the patient have difficulty dressing or bathing?: No Independently performs ADLs?: Yes (appropriate for developmental age) Does the patient have difficulty walking or climbing stairs?: Yes Weakness of Legs: None Weakness of Arms/Hands: None  Permission Sought/Granted Permission sought to share information with : Facility Sport and exercise psychologist, Family Supports Permission granted to share information with : Yes, Verbal Permission Granted  Share Information with NAME: Pam Stadler-Daughter           Emotional Assessment Appearance:: Appears stated age Attitude/Demeanor/Rapport: Engaged Affect (typically observed): Accepting Orientation: : Oriented to Self, Oriented to Place, Oriented to  Time, Oriented to Situation Alcohol / Substance Use: Not Applicable Psych Involvement: No (comment)  Admission diagnosis:  S/P TKR (total knee replacement) using cement, right [Z96.651] Patient Active Problem List   Diagnosis Date Noted   S/P TKR (total knee replacement) using cement, right 04/29/2021   Bronchiectasis without complication ([place]) 40/34/7425   Preop pulmonary/respiratory exam 03/24/2021   Low oxygen saturation 02/16/2021   Wheezing 08/18/2020   Varicose veins of both lower extremities with complications 95/63/8756   Degenerative lumbar spinal stenosis 03/15/2018   CKD (chronic kidney disease) stage 3, GFR 30-59 ml/min (HCC) 03/15/2018   Atypical chest pain 01/22/2017   Aortic atherosclerosis ([place]) 01/22/2017   Post-menopausal atrophic vaginitis 01/18/2016   Encounter for preventive health examination 01/18/2016   Colon cancer screening 01/16/2015   Upper  respiratory tract infection 04/09/2014   Obesity 12/31/2012   Obsessive compulsive disorder 12/29/2012   Gout 04/13/2012   Right knee DJD 04/03/2012   Medicare annual wellness visit, subsequent 11/26/2011    Hyperlipidemia LDL goal <70 05/25/2011   Primary hypertension 05/25/2011   Major depressive disorder, single episode, mild ([place]) 05/25/2011   PCP:  Crecencio Mc, MD Pharmacy:   [facility] [address] [place] [address] Phone: [phone] Fax: [phone]  [facility] ([place], [facility] [place] [address] [place] [address] Phone: [phone] Fax: [phone]     Social Determinants of Health (SDOH) Interventions    Readmission Risk Interventions No flowsheet data found.

## 2021-04-30 NOTE — Anesthesia Postprocedure Evaluation (Signed)
Anesthesia Post Note  Patient: SHADE RIVENBARK  Procedure(s) Performed: TOTAL KNEE ARTHROPLASTY (Right: Knee)  Patient location during evaluation: PACU Anesthesia Type: General Level of consciousness: awake and alert Pain management: pain level controlled Vital Signs Assessment: post-procedure vital signs reviewed and stable Respiratory status: spontaneous breathing, nonlabored ventilation, respiratory function stable and patient connected to nasal cannula oxygen Cardiovascular status: blood pressure returned to baseline and stable Postop Assessment: no apparent nausea or vomiting Anesthetic complications: no   No notable events documented.   Last Vitals:  Vitals:   04/30/21 0000 04/30/21 0353  BP: (!) 174/72 (!) 175/67  Pulse: 71 68  Resp: 16 17  Temp: (!) 36.4 C (!) 36.3 C  SpO2: 100% 100%    Last Pain:  Vitals:   04/30/21 0416  TempSrc:   PainSc: Asleep                 Precious Haws Xitlali Kastens

## 2021-04-30 NOTE — Progress Notes (Signed)
°  Subjective:  Patient reports pain as moderate.    Objective:   VITALS:   Vitals:   04/29/21 1930 04/29/21 2000 04/30/21 0000 04/30/21 0353  BP: (!) 165/61 (!) 187/83 (!) 174/72 (!) 175/67  Pulse: 76 80 71 68  Resp: 18 15 16 17   Temp: (!) 97.2 F (36.2 C) (!) 97.3 F (36.3 C) (!) 97.5 F (36.4 C) (!) 97.4 F (36.3 C)  TempSrc:  Temporal Temporal Temporal  SpO2: 98% 99% 100% 100%  Weight:      Height:        PHYSICAL EXAM:  Neurologically intact ABD soft Neurovascular intact Sensation intact distally Intact pulses distally Dorsiflexion/Plantar flexion intact Incision: no drainage No cellulitis present Compartment soft Drsg changed  LABS  Results for orders placed or performed during the hospital encounter of 04/29/21 (from the past 24 hour(s))  ABO/Rh     Status: None   Collection Time: 04/29/21  1:23 PM  Result Value Ref Range   ABO/RH(D)      A POS Performed at [facility], [address] [place], [place] [address]   CBC     Status: Abnormal   Collection Time: 04/30/21  6:01 AM  Result Value Ref Range   WBC 12.6 (H) 4.0 - 10.5 K/uL   RBC 3.77 (L) 3.87 - 5.11 MIL/uL   Hemoglobin 10.6 (L) 12.0 - 15.0 g/dL   HCT 32.8 (L) 36.0 - 46.0 %   MCV 87.0 80.0 - 100.0 fL   MCH 28.1 26.0 - 34.0 pg   MCHC 32.3 30.0 - 36.0 g/dL   RDW 14.7 11.5 - 15.5 %   Platelets 167 150 - 400 K/uL   nRBC 0.0 0.0 - 0.2 %    DG Knee Right Port  Result Date: 04/29/2021 CLINICAL DATA:  Status post total knee arthroplasty. EXAM: PORTABLE RIGHT KNEE - 1-2 VIEW COMPARISON:  None. FINDINGS: The patient is status post total knee arthroplasty with patellar resurfacing. Gas and fluid are seen in the joint space. There is no joint dislocation. There is no periprosthetic fracture. IMPRESSION: Postoperative appearance of total knee arthroplasty. Electronically Signed   By: Zerita Boers M.D.   On: 04/29/2021 16:38   [place] OR NERVE BLOCK-IMAGE ONLY [facility])  Result Date:  04/29/2021 There is no interpretation for this exam.  This order is for images obtained during a surgical procedure.  Please See "Surgeries" Tab for more information regarding the procedure.    Assessment/Plan: 1 Day Post-Op   Principal Problem:   S/P TKR (total knee replacement) using cement, right   Advance diet Up with therapy today SNF vs HHPT  WBAT RLE F/U in office in 2 weeks    [name] , PA-C 04/30/2021, 6:30 AM

## 2021-04-30 NOTE — Progress Notes (Signed)
Physical Therapy Treatment Patient Details Name: Jamie Anderson MRN: [id number] DOB: [birth year] Today's Date: 04/30/2021   History of Present Illness Pt is an 86 y.o. female s/p R TKA 04/29/21 secondary to unilateral primary OA R knee.  PMH includes CKD, atypical chest pain, OCD, gout, SCC, htn, asthma, and spine surgery.    PT Comments    Pt was supine in bed upon arriving. Supportive daughter present throughout session. Pt was A and O x 4 however daughter states she does have some baseline STM deficits. Author did not notice any cognition concerns throughout session. Pt was easily able to exit bed without physical assistance. While seated EOB, she was able to flex knee to ~ 93 degrees actively. She did not endorsed pain throughout session. Session progressed to ambulation 200 ft with RW with supervision only. No advert LOB or safety concerns with gait. She performed ascending/descending 4 stair 2x with supervision only. At conclusion of session, pt was in recliner with towel roll to promote knee extension and polar care reapplied. Lengthy discussion with daughter about how well pt is progressing. Pt's daughter continued to voice concerns about Dcing home even though pt wants to. Chief Strategy Officer discussed that even if pt does DC home, she will continue to have PT to progress pt to maximal independence. Daughter states," My PT friend said rehab would be better for her." Pryor Curia does feel pt could safely manage at home with continued skilled PT and occasional (PRN) assistance. Acute PT will return tomorrow and continue to progress pt per current POC.   Recommendations for follow up therapy are one component of a multi-disciplinary discharge planning process, led by the attending physician.  Recommendations may be updated based on patient status, additional functional criteria and insurance authorization.  Follow Up Recommendations  Follow physician's recommendations for discharge plan and follow up therapies      Assistance Recommended at Discharge PRN  Patient can return home with the following A little help with walking and/or transfers;A little help with bathing/dressing/bathroom;Assistance with cooking/housework;Assist for transportation;Help with stairs or ramp for entrance   Equipment Recommendations  Rolling walker (2 wheels);BSC/3in1       Precautions / Restrictions Precautions Precautions: Knee;Fall Precaution Booklet Issued: Yes (comment) Restrictions Weight Bearing Restrictions: Yes RLE Weight Bearing: Weight bearing as tolerated     Mobility  Bed Mobility   Bed Mobility: Supine to Sit, Sit to Supine     Supine to sit: Modified independent (Device/Increase time)     General bed mobility comments: pt did not require physical assistance or vcs to safely exit R side of bed. pt returned to sitting in recliner after OOB. polar care reapplied post session with towel roll to promote knee extension    Transfers Overall transfer level: Modified independent Equipment used: Rolling walker (2 wheels) Transfers: Sit to/from Stand Sit to Stand: Modified independent (Device/Increase time)           General transfer comment: pt perfromed STS EOB 3 x throughout session. no physical assistance required. on first trial STS pt was cued for proper hand placement only. 2nd/3rd trial pt was Mod I    Ambulation/Gait Ambulation/Gait assistance: Supervision Gait Distance (Feet): 200 Feet Assistive device: Rolling walker (2 wheels) Gait Pattern/deviations: Step-through pattern, Antalgic Gait velocity: decreased     General Gait Details: pt was easily and safely able to ambulate 200 ft with RW, performed ascending/descending stairs 2x without physical assistance.   Stairs Stairs: Yes Stairs assistance: Supervision Stair Management: One rail Right, Step  to pattern, Alternating pattern Number of Stairs: 4 General stair comments: pt performed ascending/descending 4 stair 2x. Author  demonstrated prior to pt performance. she was able to perform with cueing only. on 2nd trial pt went up/down stairs reciprocally(against recommendations) however was able to perform without safety concerns.daughter was present throughout session.       Balance Overall balance assessment: Modified Independent Sitting-balance support: No upper extremity supported, Feet supported Sitting balance-Leahy Scale: Normal     Standing balance support: No upper extremity supported, During functional activity, Reliant on assistive device for balance Standing balance-Leahy Scale: Good Standing balance comment: pt was safely able to static stand without UE support. Dynamic balance required UE support on RW more so due to pain than actual balance deficits.         Cognition Arousal/Alertness: Awake/alert Behavior During Therapy: WFL for tasks assessed/performed Overall Cognitive Status: Within Functional Limits for tasks assessed          General Comments: per daughter, pt has poor short term memory. Pt was however A and O x4 and seemed to be great historian of past medical history. Pt cognition did not limit or effect session progression. Pt was slightly impulsive at times throughout session however no advert concerns for safety overall.        Exercises Total Joint Exercises Goniometric ROM: ~93 degrees AROM seated EOB    General Comments General comments (skin integrity, edema, etc.): reviewed positioning and recommendations for PT going forward. Daughter would like pt to DC to rehab however pt feels she can safely manage at home with daughter assisting PRN.      Pertinent Vitals/Pain Pain Assessment Pain Assessment: No/denies pain Pain Score: 0-No pain Pain Location: R knee Pain Descriptors / Indicators: Sore Pain Intervention(s): Limited activity within patient's tolerance, Monitored during session, Repositioned, Ice applied     PT Goals (current goals can now be found in the  care plan section) Acute Rehab PT Goals Patient Stated Goal: to improve strength and mobility Progress towards PT goals: Progressing toward goals    Frequency    BID      PT Plan Discharge plan needs to be updated       AM-PAC PT "6 Clicks" Mobility   Outcome Measure  Help needed turning from your back to your side while in a flat bed without using bedrails?: None Help needed moving from lying on your back to sitting on the side of a flat bed without using bedrails?: None Help needed moving to and from a bed to a chair (including a wheelchair)?: None Help needed standing up from a chair using your arms (e.g., wheelchair or bedside chair)?: None Help needed to walk in hospital room?: A Little Help needed climbing 3-5 steps with a railing? : A Little 6 Click Score: 22    End of Session   Activity Tolerance: Patient tolerated treatment well Patient left: in chair;with call bell/phone within reach;with family/visitor present;with SCD's reapplied;Other (comment) Nurse Communication: Mobility status;Precautions;Weight bearing status PT Visit Diagnosis: Other abnormalities of gait and mobility (R26.89);Muscle weakness (generalized) (M62.81);Difficulty in walking, not elsewhere classified (R26.2);Pain Pain - Right/Left: Right Pain - part of body: Knee     Time: 9371-6967 PT Time Calculation (min) (ACUTE ONLY): 32 min  Charges:  $Gait Training: 8-22 mins $Therapeutic Activity: 8-22 mins                     Julaine Fusi PTA 04/30/21, 6:33 PM

## 2021-05-01 LAB — CBC
HCT: 31 % — ABNORMAL LOW (ref 36.0–46.0)
Hemoglobin: 10 g/dL — ABNORMAL LOW (ref 12.0–15.0)
MCH: 28.3 pg (ref 26.0–34.0)
MCHC: 32.3 g/dL (ref 30.0–36.0)
MCV: 87.8 fL (ref 80.0–100.0)
Platelets: 149 10*3/uL — ABNORMAL LOW (ref 150–400)
RBC: 3.53 MIL/uL — ABNORMAL LOW (ref 3.87–5.11)
RDW: 15.2 % (ref 11.5–15.5)
WBC: 9.3 10*3/uL (ref 4.0–10.5)
nRBC: 0 % (ref 0.0–0.2)

## 2021-05-01 MED ORDER — DOCUSATE SODIUM 100 MG PO CAPS
ORAL_CAPSULE | ORAL | Status: AC
Start: 2021-05-01 — End: 2021-05-01
  Administered 2021-05-01: 100 mg via ORAL
  Filled 2021-05-01: qty 1

## 2021-05-01 MED ORDER — MORPHINE SULFATE (PF) 4 MG/ML IV SOLN
INTRAVENOUS | Status: AC
  Administered 2021-05-01: 1 mg via INTRAVENOUS
  Filled 2021-05-01: qty 1

## 2021-05-01 MED ORDER — HYDROCODONE-ACETAMINOPHEN 7.5-325 MG PO TABS
ORAL_TABLET | ORAL | Status: AC
  Filled 2021-05-01: qty 1

## 2021-05-01 MED ORDER — MUSCLE RUB 10-15 % EX CREA
TOPICAL_CREAM | CUTANEOUS | Status: DC | PRN
  Filled 2021-05-01 (×2): qty 85

## 2021-05-01 MED ORDER — PANTOPRAZOLE SODIUM 40 MG PO TBEC
DELAYED_RELEASE_TABLET | ORAL | Status: AC
  Filled 2021-05-01: qty 1

## 2021-05-01 MED ORDER — HYDROCODONE-ACETAMINOPHEN 7.5-325 MG PO TABS
ORAL_TABLET | ORAL | Status: AC
  Filled 2021-05-01: qty 2

## 2021-05-01 MED ORDER — METHOCARBAMOL 500 MG PO TABS
ORAL_TABLET | ORAL | Status: AC
  Filled 2021-05-01: qty 1

## 2021-05-01 MED ORDER — HYDROCODONE-ACETAMINOPHEN 7.5-325 MG PO TABS
ORAL_TABLET | ORAL | Status: AC
  Administered 2021-05-01: 1 via ORAL
  Filled 2021-05-01: qty 2

## 2021-05-01 MED ORDER — ASPIRIN 81 MG PO CHEW
CHEWABLE_TABLET | ORAL | Status: AC
  Administered 2021-05-01: 81 mg via ORAL
  Filled 2021-05-01: qty 1

## 2021-05-01 NOTE — Care Management Obs Status (Signed)
[place] NOTIFICATION   Patient Details  Name: Jamie Anderson MRN: [id number] Date of Birth: 05-27-1935   Medicare Observation Status Notification Given:  Yes    Conception Oms, RN 05/01/2021, 10:58 AM

## 2021-05-01 NOTE — TOC Progression Note (Signed)
Transition of Care [facility]) - Progression Note    Patient Details  Name: Jamie Anderson MRN: [id number] Date of Birth: 1936-02-14  Transition of Care [facility]) CM/SW [place], RN Phone Number: 05/01/2021, 3:37 PM  Clinical Narrative:   [facility]  they stated that they are sending to review with their medical director and will let us know next steps    Expected Discharge Plan: [place] Barriers to Discharge: No Barriers Identified  Expected Discharge Plan and Services Expected Discharge Plan: [place]: [place] arrangements for the past 2 months: Single Family Home                                       Social Determinants of Health (SDOH) Interventions    Readmission Risk Interventions No flowsheet data found.

## 2021-05-01 NOTE — Progress Notes (Signed)
°  Subjective:  Patient reports pain as mild.  Resting in chair. Her daughter is in the room.  Objective:   VITALS:   Vitals:   05/01/21 0400 05/01/21 0732 05/01/21 1257 05/01/21 1352  BP: (!) 193/77 (!) 157/65 (!) 181/98 (!) 188/66  Pulse: 77 66 78 80  Resp: 18 16 16    Temp: 98.6 F (37 C) 97.9 F (36.6 C) 98 F (36.7 C)   TempSrc: Temporal     SpO2: 97% 97% 98% 95%  Weight:      Height:        PHYSICAL EXAM:  Sensation intact distally Dorsiflexion/Plantar flexion intact Incision: dressing C/D/I No cellulitis present Compartment soft  LABS  Results for orders placed or performed during the hospital encounter of 04/29/21 (from the past 24 hour(s))  CBC     Status: Abnormal   Collection Time: 05/01/21  6:03 AM  Result Value Ref Range   WBC 9.3 4.0 - 10.5 K/uL   RBC 3.53 (L) 3.87 - 5.11 MIL/uL   Hemoglobin 10.0 (L) 12.0 - 15.0 g/dL   HCT 31.0 (L) 36.0 - 46.0 %   MCV 87.8 80.0 - 100.0 fL   MCH 28.3 26.0 - 34.0 pg   MCHC 32.3 30.0 - 36.0 g/dL   RDW 15.2 11.5 - 15.5 %   Platelets 149 (L) 150 - 400 K/uL   nRBC 0.0 0.0 - 0.2 %    DG Knee Right Port  Result Date: 04/29/2021 CLINICAL DATA:  Status post total knee arthroplasty. EXAM: PORTABLE RIGHT KNEE - 1-2 VIEW COMPARISON:  None. FINDINGS: The patient is status post total knee arthroplasty with patellar resurfacing. Gas and fluid are seen in the joint space. There is no joint dislocation. There is no periprosthetic fracture. IMPRESSION: Postoperative appearance of total knee arthroplasty. Electronically Signed   By: Zerita Boers M.D.   On: 04/29/2021 16:38    Assessment/Plan: 2 Days Post-Op   Principal Problem:   S/P TKR (total knee replacement) using cement, right   Up with therapy Discharge to SNF tomorrow ASA 81 mg BID for DVT prophylaxis WBAT RTC as scheduled Dressing changed   Lovell Sheehan , MD 05/01/2021, 2:55 PM

## 2021-05-01 NOTE — Progress Notes (Addendum)
PT Cancellation Note  Patient Details Name: Jamie Anderson MRN: [id number] DOB: 1935-03-31   Cancelled Treatment:     PT attempt. Pt is endorsing more severe pain and daughter voices concerns about pt having "burning during urination." Author will return at 25 AM as requested after next pain medication has been administered.    Willette Pa 05/01/2021, 8:33 AM

## 2021-05-01 NOTE — TOC Progression Note (Signed)
Transition of Care [facility]) - Progression Note    Patient Details  Name: Jamie Anderson MRN: [id number] Date of Birth: Jan 04, 1936  Transition of Care [facility]) CM/SW [place], RN Phone Number: 05/01/2021, 1:18 PM  Clinical Narrative:   Ins approval requested thru [place] pending    Expected Discharge Plan: [place] Barriers to Discharge: No Barriers Identified  Expected Discharge Plan and Services Expected Discharge Plan: [place] Choice: [place] arrangements for the past 2 months: Single Family Home                                       Social Determinants of Health (SDOH) Interventions    Readmission Risk Interventions No flowsheet data found.

## 2021-05-01 NOTE — Discharge Summary (Signed)
Physician Discharge Summary  Patient ID: Jamie Anderson MRN: [id number] DOB/AGE: 11/25/1935 86 y.o.  Admit date: 04/29/2021 Discharge date: 05/02/2021  Admission Diagnoses:  M17.11 Unilateral primary osteoarthritis, right knee S/P TKR (total knee replacement) using cement, right  Discharge Diagnoses:  M17.11 Unilateral primary osteoarthritis, right knee Principal Problem:   S/P TKR (total knee replacement) using cement, right   Past Medical History:  Diagnosis Date   Arthritis    Asthma    Basal cell carcinoma 01/17/2018   Left chest. Infiltrative. Excised 02/06/2018, margins free.   Cystic teratoma of right ovary 11/23/2012   She underwent right salpingo-oophorectomy on April 1 by Dr. Kenton Anderson for removal of what turned out to be a cystic teratoma. Hysteroscopy was also done at the time due to postmenopausal bleeding. It is unclear  why the left ovary was not taken out since she is postmenopausal.    Dyspnea    GERD (gastroesophageal reflux disease)    Granuloma annulare    Hyperlipidemia    Hypertension    Menopause    Plantar fasciitis of right foot 12/24/2014   SCC (squamous cell carcinoma) 10/08/2020   R mid lower pretibia, EDC   SCC (squamous cell carcinoma) 03/16/2021   R upper pretibial, needs EDC pt sched 06/02/21   Squamous cell carcinoma of skin 02/26/2020   R lower pretibial, clear with bx 04/01/20   Squamous cell carcinoma of skin 07/15/2020   R mid pretibia, EDC   Squamous cell carcinoma of skin 12/09/2020   R lateral pretibia, EDC   Stage 3b chronic kidney disease (CKD) ([place])     Surgeries: Procedure(s): TOTAL KNEE ARTHROPLASTY on 04/29/2021   Consultants (if any): Treatment Team:  Lovell Sheehan, MD  Discharged Condition: Improved  Hospital Course: Jamie Anderson is an 86 y.o. female who was admitted 04/29/2021 with a diagnosis of  M17.11 Unilateral primary osteoarthritis, right knee S/P TKR (total knee replacement) using cement, right and went to the  operating room on 04/29/2021 and underwent the above named procedures.    She was given perioperative antibiotics:  Anti-infectives (From admission, onward)    Start     Dose/Rate Route Frequency Ordered Stop   04/30/21 0600  vancomycin (VANCOCIN) IVPB 1000 mg/200 mL premix        1,000 mg 200 mL/hr over 60 Minutes Intravenous On call to O.R. 04/29/21 1310 04/29/21 1501   04/29/21 2000  ceFAZolin (ANCEF) IVPB 2g/100 mL premix        2 g 200 mL/hr over 30 Minutes Intravenous Every 6 hours 04/29/21 1735 04/30/21 0200   04/29/21 1328  vancomycin (VANCOCIN) 1-5 GM/200ML-% IVPB       Note to Pharmacy: Trudie Reed S: cabinet override      04/29/21 1328 04/29/21 1439     .  She was given sequential compression devices, early ambulation, and ASA for DVT prophylaxis.  She benefited maximally from the hospital stay and there were no complications.    Recent vital signs:  Vitals:   05/02/21 0755 05/02/21 0814  BP: (!) 184/77 (!) 180/72  Pulse: 87 82  Resp: 18 14  Temp: 98.3 F (36.8 C) 98 F (36.7 C)  SpO2: 99% 97%    Recent laboratory studies:  Lab Results  Component Value Date   HGB 10.0 (L) 05/01/2021   HGB 10.6 (L) 04/30/2021   HGB 12.4 08/18/2020   Lab Results  Component Value Date   WBC 9.3 05/01/2021   PLT 149 (L) 05/01/2021  Lab Results  Component Value Date   INR 0.9 05/29/2012   Lab Results  Component Value Date   NA 135 04/30/2021   K 4.5 04/30/2021   CL 104 04/30/2021   CO2 24 04/30/2021   BUN 19 04/30/2021   CREATININE 1.12 (H) 04/30/2021   GLUCOSE 135 (H) 04/30/2021    Discharge Medications:   Allergies as of 05/02/2021       Reactions   Clarithromycin    Unknown   Colloidal Oatmeal    Unknown reaction   Dust Mite Extract    Oat    Unknown reaction   Other    Almonds - unknown reaction Mold Spores   Shrimp [shellfish Allergy]    Unknown reaction         Medication List     STOP taking these medications    aspirin EC 81 MG  tablet Replaced by: aspirin 81 MG chewable tablet   CO Q 10 PO   mupirocin ointment 2 % Commonly known as: BACTROBAN   MUSCLE RUB EX   traMADol 50 MG tablet Commonly known as: ULTRAM   trolamine salicylate 10 % cream Commonly known as: ASPERCREME   ZIMS MAX-FREEZE EX       TAKE these medications    acetaminophen 325 MG tablet Commonly known as: TYLENOL Take 1-2 tablets (325-650 mg total) by mouth every 6 (six) hours as needed for mild pain (pain score 1-3 or temp > 100.5). What changed:  medication strength how much to take reasons to take this   albuterol 108 (90 Base) MCG/ACT inhaler Commonly known as: VENTOLIN HFA Inhale 1 puff into the lungs every 6 (six) hours as needed for wheezing or shortness of breath.   allopurinol 100 MG tablet Commonly known as: ZYLOPRIM TAKE 1 TABLET BY MOUTH  DAILY   aspirin 81 MG chewable tablet Chew 1 tablet (81 mg total) by mouth 2 (two) times daily. Replaces: aspirin EC 81 MG tablet   calcium carbonate 500 MG chewable tablet Commonly known as: TUMS - dosed in mg elemental calcium Chew 1 tablet by mouth daily as needed for indigestion or heartburn.   cetirizine 10 MG tablet Commonly known as: ZYRTEC Take 10 mg by mouth daily as needed for allergies.   clobetasol cream 0.05 % Commonly known as: TEMOVATE Apply to itchy rash on back 1-2 times a day until improved. Avoid face, groin, axilla. What changed:  how much to take how to take this when to take this reasons to take this additional instructions   docusate sodium 100 MG capsule Commonly known as: COLACE Take 1 capsule (100 mg total) by mouth 2 (two) times daily.   ezetimibe 10 MG tablet Commonly known as: ZETIA TAKE 1 TABLET BY MOUTH  DAILY   fluocinonide cream 0.05 % Commonly known as: LIDEX Apply 1 application topically 2 (two) times daily as needed (itching).   fluticasone 50 MCG/ACT nasal spray Commonly known as: FLONASE Place 2 sprays into both  nostrils daily. What changed:  when to take this reasons to take this   fluticasone-salmeterol 100-50 MCG/ACT Aepb Commonly known as: ADVAIR Inhale 1 puff into the lungs 2 (two) times daily. Rinse mouth after each use. What changed:  when to take this reasons to take this   HYDROcodone-acetaminophen 5-325 MG tablet Commonly known as: NORCO/VICODIN Take 1-2 tablets by mouth every 4 (four) hours as needed for moderate pain (pain score 4-6).   methocarbamol 500 MG tablet Commonly known as: ROBAXIN Take 1  tablet (500 mg total) by mouth every 6 (six) hours as needed for muscle spasms.   metoprolol succinate 25 MG 24 hr tablet Commonly known as: TOPROL-XL TAKE 1 TABLET BY MOUTH  DAILY What changed: when to take this   nystatin 100000 UNIT/ML suspension Commonly known as: MYCOSTATIN Take 5 mLs (500,000 Units total) by mouth 4 (four) times daily. What changed:  when to take this reasons to take this   pantoprazole 40 MG tablet Commonly known as: PROTONIX TAKE 1 TABLET BY MOUTH  DAILY   Premarin vaginal cream Generic drug: conjugated estrogens Place 1 Applicatorful vaginally 2 (two) times a week. What changed: See the new instructions.   sertraline 100 MG tablet Commonly known as: ZOLOFT TAKE 1 TABLET BY MOUTH  DAILY   simvastatin 40 MG tablet Commonly known as: ZOCOR Take 1 tablet (40 mg total) by mouth at bedtime.   telmisartan 80 MG tablet Commonly known as: MICARDIS Take 1 tablet (80 mg total) by mouth daily. What changed: when to take this   VITAMIN D PO Take 1 capsule by mouth daily.        Diagnostic Studies: DG Knee Right Port  Result Date: 04/29/2021 CLINICAL DATA:  Status post total knee arthroplasty. EXAM: PORTABLE RIGHT KNEE - 1-2 VIEW COMPARISON:  None. FINDINGS: The patient is status post total knee arthroplasty with patellar resurfacing. Gas and fluid are seen in the joint space. There is no joint dislocation. There is no periprosthetic fracture.  IMPRESSION: Postoperative appearance of total knee arthroplasty. Electronically Signed   By: Zerita Boers M.D.   On: 04/29/2021 16:38   [place] OR NERVE BLOCK-IMAGE ONLY [facility])  Result Date: 04/29/2021 There is no interpretation for this exam.  This order is for images obtained during a surgical procedure.  Please See "Surgeries" Tab for more information regarding the procedure.    Disposition:      Contact information for after-discharge care     Destination     [facility] .   Service: Skilled Nursing Contact information: [place] [phone]                      Signed: [place] ,MD 05/02/2021, 9:30 AM

## 2021-05-01 NOTE — Discharge Instructions (Signed)
Continue weight bear as tolerated on the right lower extremity.    Elevate the right lower extremity whenever possible and continue the polar care while elevating the extremity. Patient may shower. No bath or submerging the wound.    Take aspirin as directed for blood clot prevention.  Continue to work on knee range of motion exercises at home as instructed by physical therapy. Continue to use a walker for assistance with ambulation until cleared by physical therapy.  Call [phone] with any questions, such as fever > 101.5 degrees, drainage from the wound or shortness of breath.  

## 2021-05-01 NOTE — Progress Notes (Signed)
PT Cancellation Note  Patient Details Name: Jamie Anderson MRN: [id number] DOB: November 16, 1935   Cancelled Treatment:     PT attempt. PT hold. Pt is extremely lethargic and unable to keep eyes open. Daughter states they are waiting for cream to place to help pt with pain. PT will return tomorrow and continue to follow per current POC.    Willette Pa 05/01/2021, 3:48 PM

## 2021-05-01 NOTE — TOC Progression Note (Addendum)
Transition of Care [facility]) - Progression Note    Patient Details  Name: Jamie Anderson MRN: [id number] Date of Birth: 12/05/1935  Transition of Care [facility]) CM/SW [place], RN Phone Number: 05/01/2021, 9:43 AM  Clinical Narrative:   Damaris Schooner [facility], [facility] but unable to take until tomorrow, I notified the physician and the nurse. Ins is pending  Patient accepted the bed offer with [facility] and I reviewed the Observation status with her, she stated understanding   Expected Discharge Plan: [place] Barriers to Discharge: No Barriers Identified  Expected Discharge Plan and Services Expected Discharge Plan: [place] Choice: [place] arrangements for the past 2 months: Single Family Home                                       Social Determinants of Health (SDOH) Interventions    Readmission Risk Interventions No flowsheet data found.

## 2021-05-01 NOTE — Evaluation (Signed)
Occupational Therapy Evaluation Patient Details Name: Jamie Anderson MRN: [id number] DOB: 1935/12/29 Today's Date: 05/01/2021   History of Present Illness Pt is an 86 y.o. female s/p R TKA 04/29/21 secondary to unilateral primary OA R knee.  PMH includes CKD, atypical chest pain, OCD, gout, SCC, htn, asthma, and spine surgery.   Clinical Impression   Jamie Anderson was seen for OT evaluation this date. Prior to hospital admission, pt was MOD I for mobility and ADLs. Pt lives alone, plans to stay with daughter who works during the day. Pt presents to acute OT demonstrating impaired ADL performance and functional mobility 2/2 decreased activity tolerance and functional strength/ROM/balance deficits. Pt currently requires SUPERVISION exit L side of bed. MAX A don/doff B socks seated EOB. SBA + RW for BSC t/f and perihygiene in standing. Pt would benefit from skilled OT to address noted impairments and functional limitations (see below for any additional details). Upon hospital discharge, recommend follow up OT at next venue of care, pt will require assistance for LBD mgmt and falls prevention.    Recommendations for follow up therapy are one component of a multi-disciplinary discharge planning process, led by the attending physician.  Recommendations may be updated based on patient status, additional functional criteria and insurance authorization.   Follow Up Recommendations  Follow physician's recommendations for discharge plan and follow up therapies    Assistance Recommended at Discharge Frequent or constant Supervision/Assistance  Patient can return home with the following A little help with walking and/or transfers;A little help with bathing/dressing/bathroom;Help with stairs or ramp for entrance    Functional Status Assessment  Patient has had a recent decline in their functional status and demonstrates the ability to make significant improvements in function in a reasonable and predictable amount  of time.  Equipment Recommendations  None recommended by OT    Recommendations for Other Services       Precautions / Restrictions Precautions Precautions: Knee;Fall Precaution Booklet Issued: Yes (comment) Restrictions Weight Bearing Restrictions: Yes RLE Weight Bearing: Weight bearing as tolerated      Mobility Bed Mobility Overal bed mobility: Needs Assistance Bed Mobility: Supine to Sit     Supine to sit: Supervision     General bed mobility comments: encouragement and cues    Transfers Overall transfer level: Needs assistance Equipment used: Rolling walker (2 wheels) Transfers: Sit to/from Stand Sit to Stand: Min guard                  Balance Overall balance assessment: Needs assistance Sitting-balance support: No upper extremity supported, Feet supported Sitting balance-Leahy Scale: Normal     Standing balance support: No upper extremity supported, During functional activity Standing balance-Leahy Scale: Fair                             ADL either performed or assessed with clinical judgement   ADL Overall ADL's : Needs assistance/impaired                                       General ADL Comments: MAX A don/doff B socks seated EOB. SBA + RW for BSC t/f and perihygiene in standing.      Pertinent Vitals/Pain Pain Assessment Pain Assessment: 0-10 Pain Score: 5  Pain Location: R knee Pain Descriptors / Indicators: Sore Pain Intervention(s): Premedicated before session, Repositioned  Hand Dominance     Extremity/Trunk Assessment Upper Extremity Assessment Upper Extremity Assessment: Overall WFL for tasks assessed   Lower Extremity Assessment Lower Extremity Assessment: Generalized weakness       Communication Communication Communication: No difficulties   Cognition Arousal/Alertness: Awake/alert Behavior During Therapy: WFL for tasks assessed/performed Overall Cognitive Status: Within Functional  Limits for tasks assessed                                 General Comments: Pt is A and oriented x 4 and agreeable to session     General Comments  will review HEP in PM session     Home Living Family/patient expects to be discharged to:: Private residence Living Arrangements: Alone (DTR) Available Help at Discharge: Family;Available PRN/intermittently Type of Home: House (following home set-up is daughter's home set up (pt reports living alone with her cat in 1 level home with level entry)) Home Access: Stairs to enter Entrance Stairs-Number of Steps: 4 Entrance Stairs-Rails: Right Home Layout: Two level;Able to live on main level with bedroom/bathroom     Bathroom Shower/Tub: Tub/shower unit;Walk-in shower   Bathroom Toilet: Standard     Home Equipment: Conservation officer, nature (2 wheels);Cane - single point;Hand held shower head;Shower seat - built in          Prior Functioning/Environment Prior Level of Function : Independent/Modified Independent             Mobility Comments: No recent falls          OT Problem List: Decreased strength;Decreased range of motion;Decreased activity tolerance;Impaired balance (sitting and/or standing);Decreased safety awareness      OT Treatment/Interventions: Therapeutic exercise;Self-care/ADL training;Energy conservation;DME and/or AE instruction;Therapeutic activities;Patient/family education;Balance training    OT Goals(Current goals can be found in the care plan section) Acute Rehab OT Goals Patient Stated Goal: to get better OT Goal Formulation: With patient/family Time For Goal Achievement: 05/15/21 Potential to Achieve Goals: Good ADL Goals Pt Will Perform Grooming: Independently;standing Pt Will Perform Lower Body Dressing: with modified independence;sit to/from stand Pt Will Transfer to Toilet: with modified independence;ambulating;regular height toilet  OT Frequency: Min 2X/week    Co-evaluation               AM-PAC OT "6 Clicks" Daily Activity     Outcome Measure Help from another person eating meals?: None Help from another person taking care of personal grooming?: A Little Help from another person toileting, which includes using toliet, bedpan, or urinal?: A Little Help from another person bathing (including washing, rinsing, drying)?: A Little Help from another person to put on and taking off regular upper body clothing?: None Help from another person to put on and taking off regular lower body clothing?: A Lot 6 Click Score: 19   End of Session Equipment Utilized During Treatment: Rolling walker (2 wheels)  Activity Tolerance: Patient tolerated treatment well Patient left: in chair;with call bell/phone within reach;with family/visitor present  OT Visit Diagnosis: Other abnormalities of gait and mobility (R26.89);Muscle weakness (generalized) (M62.81)                Time: 2951-8841 OT Time Calculation (min): 24 min Charges:  OT General Charges $OT Visit: 1 Visit OT Evaluation $OT Eval Low Complexity: 1 Low OT Treatments $Self Care/Home Management : 8-22 mins  Dessie Coma, M.S. OTR/L  05/01/21, 12:53 PM  ascom [phone]

## 2021-05-01 NOTE — Progress Notes (Signed)
Physical Therapy Treatment Patient Details Name: Jamie Anderson MRN: [id number] DOB: 1935-07-15 Today's Date: 05/01/2021   History of Present Illness Pt is an 86 y.o. female s/p R TKA 04/29/21 secondary to unilateral primary OA R knee.  PMH includes CKD, atypical chest pain, OCD, gout, SCC, htn, asthma, and spine surgery.    PT Comments    Pt just finished OT session upon arriving. She is A and O and agreeable to session. Does endorse much more severe pain this date versus previous date. She required min assist to stand form lower recliner height surface. Pt then ambulated to BR and successfully urinated prior to standing and ambulating ~ 80 ft with supervision + Rw. Overall pt is progressing well. Daughter present throughout session and is hopeful pt will DC to rehab tomorrow. Pt will benefit from continued skilled PT going forward to address deficits while maximizing independence with all ADLs.    Recommendations for follow up therapy are one component of a multi-disciplinary discharge planning process, led by the attending physician.  Recommendations may be updated based on patient status, additional functional criteria and insurance authorization.  Follow Up Recommendations  Skilled nursing-short term rehab (<3 hours/day) (pt will not have 24/7 assistance at DC. Family/pt feel rehab is best option at DC.)     Assistance Recommended at Discharge PRN  Patient can return home with the following A little help with walking and/or transfers;A little help with bathing/dressing/bathroom;Assistance with cooking/housework;Assist for transportation;Help with stairs or ramp for entrance   Equipment Recommendations  Rolling walker (2 wheels);BSC/3in1       Precautions / Restrictions Precautions Precautions: Knee;Fall Precaution Booklet Issued: Yes (comment) Restrictions Weight Bearing Restrictions: Yes RLE Weight Bearing: Weight bearing as tolerated     Mobility  Bed Mobility   General bed  mobility comments: pt was recliner pre/post session    Transfers Overall transfer level: Modified independent Equipment used: Rolling walker (2 wheels) Transfers: Sit to/from Stand Sit to Stand: Min assist, Supervision           General transfer comment: required min assist to stand from recliner however once    Ambulation/Gait Ambulation/Gait assistance: Supervision Gait Distance (Feet): 80 Feet Assistive device: Rolling walker (2 wheels) Gait Pattern/deviations: Step-through pattern, Antalgic Gait velocity: decreased     General Gait Details: pt first ambulated 25 ft to Br then ambulated ~ 80 more ft in hallway with RW. No LOB or safety coincerns during gait training.   Balance Overall balance assessment: Modified Independent Sitting-balance support: No upper extremity supported, Feet supported Sitting balance-Leahy Scale: Normal     Standing balance support: No upper extremity supported, During functional activity, Reliant on assistive device for balance Standing balance-Leahy Scale: Good Standing balance comment: pt was safely able to static stand without UE support. Dynamic balance required UE support on RW more so due to pain than actual balance deficits.       Cognition Arousal/Alertness: Awake/alert Behavior During Therapy: WFL for tasks assessed/performed Overall Cognitive Status: Within Functional Limits for tasks assessed      General Comments: Pt is A and oriented x 4 and agreeable to session           General Comments General comments (skin integrity, edema, etc.): will review HEP in PM session      Pertinent Vitals/Pain Pain Assessment Pain Assessment: 0-10 Pain Score: 8  Pain Location: R knee Pain Descriptors / Indicators: Sore Pain Intervention(s): Limited activity within patient's tolerance, Monitored during session, Repositioned, Premedicated before session,  Ice applied     PT Goals (current goals can now be found in the care plan  section) Acute Rehab PT Goals Patient Stated Goal: get better Progress towards PT goals: Progressing toward goals    Frequency    BID      PT Plan Discharge plan needs to be updated       AM-PAC PT "6 Clicks" Mobility   Outcome Measure  Help needed turning from your back to your side while in a flat bed without using bedrails?: None Help needed moving from lying on your back to sitting on the side of a flat bed without using bedrails?: None Help needed moving to and from a bed to a chair (including a wheelchair)?: A Little Help needed standing up from a chair using your arms (e.g., wheelchair or bedside chair)?: A Little Help needed to walk in hospital room?: A Little Help needed climbing 3-5 steps with a railing? : A Little 6 Click Score: 20    End of Session   Activity Tolerance: Patient tolerated treatment well Patient left: in chair;with call bell/phone within reach;with family/visitor present;with SCD's reapplied;Other (comment) Nurse Communication: Mobility status;Precautions;Weight bearing status PT Visit Diagnosis: Other abnormalities of gait and mobility (R26.89);Muscle weakness (generalized) (M62.81);Difficulty in walking, not elsewhere classified (R26.2);Pain Pain - Right/Left: Right Pain - part of body: Knee     Time: 1103-1130 PT Time Calculation (min) (ACUTE ONLY): 27 min  Charges:  $Gait Training: 8-22 mins $Therapeutic Activity: 8-22 mins                    Julaine Fusi PTA 05/01/21, 12:24 PM

## 2021-05-01 NOTE — TOC Progression Note (Signed)
Transition of Care [facility]) - Progression Note    Patient Details  Name: Jamie Anderson MRN: [id number] Date of Birth: Apr 20, 1935  Transition of Care [facility]) CM/SW [place], RN Phone Number: 05/01/2021, 8:42 AM  Clinical Narrative:   reached out to facilities to inquire if they would be able to make an offer for a STR SNF bed, awaiting response    Expected Discharge Plan: [place] Barriers to Discharge: No Barriers Identified  Expected Discharge Plan and Services Expected Discharge Plan: [place] Choice: [place] arrangements for the past 2 months: Single Family Home                                       Social Determinants of Health (SDOH) Interventions    Readmission Risk Interventions No flowsheet data found.

## 2021-05-02 LAB — SARS CORONAVIRUS 2 (TAT 6-24 HRS): SARS Coronavirus 2: NEGATIVE

## 2021-05-02 LAB — URINALYSIS, COMPLETE (UACMP) WITH MICROSCOPIC
Bacteria, UA: NONE SEEN
Bilirubin Urine: NEGATIVE
Glucose, UA: NEGATIVE mg/dL
Hgb urine dipstick: NEGATIVE
Ketones, ur: 5 mg/dL — AB
Leukocytes,Ua: NEGATIVE
Nitrite: NEGATIVE
Protein, ur: 30 mg/dL — AB
Specific Gravity, Urine: 1.016 (ref 1.005–1.030)
pH: 7 (ref 5.0–8.0)

## 2021-05-02 MED ORDER — DOCUSATE SODIUM 100 MG PO CAPS: 100.0000 mg | ORAL_CAPSULE | Freq: Two times a day (BID) | ORAL | 0 refills | Status: DC

## 2021-05-02 MED ORDER — LOPERAMIDE HCL 2 MG PO CAPS
4.0000 mg | ORAL_CAPSULE | ORAL | Status: DC | PRN
  Administered 2021-05-02: 4 mg via ORAL
  Filled 2021-05-02: qty 2

## 2021-05-02 MED ORDER — ACETAMINOPHEN 325 MG PO TABS
325.0000 mg | ORAL_TABLET | Freq: Four times a day (QID) | ORAL | 1 refills | Status: AC | PRN
Start: 2021-05-02 — End: ?

## 2021-05-02 MED ORDER — MAGNESIUM HYDROXIDE 400 MG/5ML PO SUSP
30.0000 mL | Freq: Every day | ORAL | Status: DC | PRN
  Administered 2021-05-02: 30 mL via ORAL
  Filled 2021-05-02: qty 30

## 2021-05-02 MED ORDER — METHOCARBAMOL 500 MG PO TABS
500.0000 mg | ORAL_TABLET | Freq: Four times a day (QID) | ORAL | 0 refills | Status: DC | PRN
Start: 2021-05-02 — End: 2021-06-08

## 2021-05-02 MED ORDER — ASPIRIN 81 MG PO CHEW: 81.0000 mg | CHEWABLE_TABLET | Freq: Two times a day (BID) | ORAL | 0 refills | Status: DC

## 2021-05-02 MED ORDER — HYDROCODONE-ACETAMINOPHEN 5-325 MG PO TABS: 1.0000 | ORAL_TABLET | ORAL | 0 refills | Status: DC | PRN

## 2021-05-02 NOTE — TOC Progression Note (Signed)
Transition of Care [facility]) - Progression Note    Patient Details  Name: Jamie Anderson MRN: [id number] Date of Birth: Mar 31, 1935  Transition of Care [facility]) CM/SW [place], [place] Phone Number: 05/02/2021, 12:39 PM  Clinical Narrative:  CSW spoke with patients daughter Olin Hauser to let her know insurance authorization is still pending review. Patients daughter is frustrated and wanted to know why it was taking so long. CSW told patients daughter that she is not sure and was only told that the medical director from [employer] has not reviewed patients referral at this time.      Expected Discharge Plan: [place] Barriers to Discharge: No Barriers Identified  Expected Discharge Plan and Services Expected Discharge Plan: [place] Choice: [place] arrangements for the past 2 months: Single Family Home                                       Social Determinants of Health (SDOH) Interventions    Readmission Risk Interventions No flowsheet data found.

## 2021-05-02 NOTE — Progress Notes (Signed)
Physical Therapy Treatment Patient Details Name: Jamie Anderson MRN: [id number] DOB: [birth year] Today's Date: 05/02/2021   History of Present Illness Pt is an 86 y.o. female s/p R TKA 04/29/21 secondary to unilateral primary OA R knee.  PMH includes CKD, atypical chest pain, OCD, gout, SCC, htn, asthma, and spine surgery.    PT Comments    Pt was seated in recliner upon arriving. She agrees to PT session with encouragement. " I think I got too much laxatives." Pt reports having had several, loose, frequent Bms. She eventually agrees to ambulate however request to stay close to BR. She ambulated to doorway of room and back 2 x prior to requesting to have BM. Pt continues to require supervision only for transfers and ambulation. Unwilling to perform ROM /HEP due to c/o pain. Overall limited by pain and stomach/frequent Bms. Pt is awaiting insurance author to DC to SNF. PT will continue to follow and progress as able per current POC.    Recommendations for follow up therapy are one component of a multi-disciplinary discharge planning process, led by the attending physician.  Recommendations may be updated based on patient status, additional functional criteria and insurance authorization.  Follow Up Recommendations  Follow physician's recommendations for discharge plan and follow up therapies     Assistance Recommended at Discharge PRN  Patient can return home with the following A little help with walking and/or transfers;A little help with bathing/dressing/bathroom;Assistance with cooking/housework;Assist for transportation;Help with stairs or ramp for entrance   Equipment Recommendations  Rolling walker (2 wheels);BSC/3in1       Precautions / Restrictions Precautions Precautions: Knee;Fall Precaution Booklet Issued: Yes (comment) Restrictions Weight Bearing Restrictions: Yes RLE Weight Bearing: Weight bearing as tolerated     Mobility  Bed Mobility      General bed mobility  comments: In recliner pre/post session. Per Pt/staff pt has been up to BR ~ 4 x today.    Transfers Overall transfer level: Needs assistance Equipment used: Rolling walker (2 wheels) Transfers: Sit to/from Stand Sit to Stand: Supervision           General transfer comment: Pt was able to stand withotu phyical assistance. Did required vcs for improved technqiue and sequencing    Ambulation/Gait Ambulation/Gait assistance: Supervision Gait Distance (Feet): 50 Feet Assistive device: Rolling walker (2 wheels) Gait Pattern/deviations: Antalgic, Step-to pattern       General Gait Details: Pt ambulated to doorway of room and back 2 x before needing to have a BM. Pt reports having 4 BM in past few hours. No LOB during gait.     Balance Overall balance assessment: Needs assistance Sitting-balance support: No upper extremity supported, Feet supported Sitting balance-Leahy Scale: Normal Sitting balance - Comments: steady sitting reaching outside BOS   Standing balance support: During functional activity, Bilateral upper extremity supported Standing balance-Leahy Scale: Fair       Cognition Arousal/Alertness: Awake/alert Behavior During Therapy: WFL for tasks assessed/performed Overall Cognitive Status: Within Functional Limits for tasks assessed      General Comments: Pt is A and oriented x 4 and agreeable to session           General Comments General comments (skin integrity, edema, etc.): session greatly limited by pt having multiple BMs during session      Pertinent Vitals/Pain Pain Assessment Pain Assessment: 0-10 Pain Score: 7  Pain Location: R knee Pain Descriptors / Indicators: Sore Pain Intervention(s): Limited activity within patient's tolerance, Monitored during session, Premedicated before session, Repositioned, Ice  applied     PT Goals (current goals can now be found in the care plan section) Acute Rehab PT Goals Patient Stated Goal: get better so I can  return home eventually Progress towards PT goals: Progressing toward goals    Frequency    BID      PT Plan Frequency needs to be updated       AM-PAC PT "6 Clicks" Mobility   Outcome Measure  Help needed turning from your back to your side while in a flat bed without using bedrails?: None Help needed moving from lying on your back to sitting on the side of a flat bed without using bedrails?: None Help needed moving to and from a bed to a chair (including a wheelchair)?: None Help needed standing up from a chair using your arms (e.g., wheelchair or bedside chair)?: A Little Help needed to walk in hospital room?: A Little Help needed climbing 3-5 steps with a railing? : A Little 6 Click Score: 21    End of Session   Activity Tolerance: Patient tolerated treatment well Patient left: in chair;with call bell/phone within reach;with family/visitor present;with SCD's reapplied;Other (comment) Nurse Communication: Mobility status;Precautions;Weight bearing status PT Visit Diagnosis: Other abnormalities of gait and mobility (R26.89);Muscle weakness (generalized) (M62.81);Difficulty in walking, not elsewhere classified (R26.2);Pain Pain - Right/Left: Right Pain - part of body: Knee     Time: 1530-1555 PT Time Calculation (min) (ACUTE ONLY): 25 min  Charges:  $Gait Training: 8-22 mins $Therapeutic Activity: 8-22 mins                     Julaine Fusi PTA 05/02/21, 4:51 PM

## 2021-05-02 NOTE — Plan of Care (Signed)
°  Problem: Education: Goal: Knowledge of General Education information will improve Description: Including pain rating scale, medication(s)/side effects and non-pharmacologic comfort measures Outcome: Progressing   Problem: Health Behavior/Discharge Planning: Goal: Ability to manage health-related needs will improve Outcome: Progressing   Problem: Clinical Measurements: Goal: Will remain free from infection Outcome: Progressing   Problem: Clinical Measurements: Goal: Diagnostic test results will improve Outcome: Progressing   Problem: Clinical Measurements: Goal: Cardiovascular complication will be avoided Outcome: Progressing   Problem: Elimination: Goal: Will not experience complications related to urinary retention Outcome: Progressing   Problem: Pain Managment: Goal: General experience of comfort will improve Outcome: Progressing

## 2021-05-02 NOTE — Progress Notes (Signed)
Pt complaint of frequent urination throughout tonight.  States she urinates very little at a time.  No complaints of burning with urination and this nurse only emptied 736ml from purewick canister.  Pt concerned if it could be a UTI, would like MD to consider UA.

## 2021-05-02 NOTE — Progress Notes (Signed)
PT Cancellation Note  Patient Details Name: Jamie Anderson MRN: [id number] DOB: [birth year]   Cancelled Treatment:     PT attempted x 4 this AM. Pt was eating and was unwilling to participate several times. Will return in PM and continue to follow and progress as able per current POC.    Willette Pa 05/02/2021, 3:24 PM

## 2021-05-02 NOTE — Progress Notes (Signed)
Insurance authorization is still pending. 

## 2021-05-02 NOTE — Progress Notes (Signed)
°  Subjective:  POD #3 s/p right total knee arthroplasty.   Patient reports moderate right knee pain.  Patient states she urinated frequently overnight with mild burning.  Patient states she does not have much of an appetite.  She has not yet had a bowel movement.  Objective:   VITALS:   Vitals:   05/01/21 2056 05/02/21 0341 05/02/21 0755 05/02/21 0814  BP: (!) 162/74 (!) 176/78 (!) 184/77 (!) 180/72  Pulse: 72 (!) 52 87 82  Resp: 18 16 18 14   Temp:  98.2 F (36.8 C) 98.3 F (36.8 C) 98 F (36.7 C)  TempSrc:  Oral  Oral  SpO2: 97% 96% 99% 97%  Weight:      Height:        PHYSICAL EXAM: Right lower extremity Neurovascular intact Sensation intact distally Intact pulses distally Dorsiflexion/Plantar flexion intact Dressing: C/D/I No cellulitis present Compartment soft  LABS  No results found for this or any previous visit (from the past 24 hour(s)).  No results found.  Assessment/Plan: 3 Days Post-Op   Principal Problem:   S/P TKR (total knee replacement) using cement, right  Patient awaiting insurance approval to go to a skilled nursing facility postop.  Prescriptions are signed and on the chart.  Discharge paperwork has been completed.  A UA will be sent this morning to determine if the patient has a urinary tract infection.  Milk of Magnesia ordered to help patient have a bowel movement.   [place] , MD 05/02/2021, 10:03 AM

## 2021-05-03 ENCOUNTER — Observation Stay: Payer: Medicare Other

## 2021-05-03 DIAGNOSIS — I82432 Acute embolism and thrombosis of left popliteal vein: Secondary | ICD-10-CM | POA: Diagnosis not present

## 2021-05-03 DIAGNOSIS — I82461 Acute embolism and thrombosis of right calf muscular vein: Secondary | ICD-10-CM

## 2021-05-03 DIAGNOSIS — I951 Orthostatic hypotension: Secondary | ICD-10-CM | POA: Clinically undetermined

## 2021-05-03 DIAGNOSIS — J984 Other disorders of lung: Secondary | ICD-10-CM | POA: Diagnosis not present

## 2021-05-03 DIAGNOSIS — I2510 Atherosclerotic heart disease of native coronary artery without angina pectoris: Secondary | ICD-10-CM | POA: Diagnosis not present

## 2021-05-03 DIAGNOSIS — I480 Paroxysmal atrial fibrillation: Secondary | ICD-10-CM

## 2021-05-03 DIAGNOSIS — I700 Atherosclerosis of aorta: Secondary | ICD-10-CM | POA: Diagnosis not present

## 2021-05-03 DIAGNOSIS — Z9889 Other specified postprocedural states: Secondary | ICD-10-CM | POA: Diagnosis not present

## 2021-05-03 LAB — BASIC METABOLIC PANEL
Anion gap: 11 (ref 5–15)
BUN: 34 mg/dL — ABNORMAL HIGH (ref 8–23)
CO2: 26 mmol/L (ref 22–32)
Calcium: 9.4 mg/dL (ref 8.9–10.3)
Chloride: 97 mmol/L — ABNORMAL LOW (ref 98–111)
Creatinine, Ser: 1.3 mg/dL — ABNORMAL HIGH (ref 0.44–1.00)
GFR, Estimated: 41 mL/min — ABNORMAL LOW (ref >60)
Glucose, Bld: 127 mg/dL — ABNORMAL HIGH (ref 70–99)
Potassium: 4.3 mmol/L (ref 3.5–5.1)
Sodium: 134 mmol/L — ABNORMAL LOW (ref 135–145)

## 2021-05-03 LAB — BRAIN NATRIURETIC PEPTIDE: B Natriuretic Peptide: 223 pg/mL — ABNORMAL HIGH (ref 0.0–100.0)

## 2021-05-03 LAB — CBC
HCT: 32.9 % — ABNORMAL LOW (ref 36.0–46.0)
Hemoglobin: 10.8 g/dL — ABNORMAL LOW (ref 12.0–15.0)
MCH: 28.2 pg (ref 26.0–34.0)
MCHC: 32.8 g/dL (ref 30.0–36.0)
MCV: 85.9 fL (ref 80.0–100.0)
Platelets: 221 10*3/uL (ref 150–400)
RBC: 3.83 MIL/uL — ABNORMAL LOW (ref 3.87–5.11)
RDW: 15 % (ref 11.5–15.5)
WBC: 11.7 10*3/uL — ABNORMAL HIGH (ref 4.0–10.5)
nRBC: 0 % (ref 0.0–0.2)

## 2021-05-03 LAB — LACTIC ACID, PLASMA
Lactic Acid, Venous: 1.3 mmol/L (ref 0.5–1.9)
Lactic Acid, Venous: 1.4 mmol/L (ref 0.5–1.9)

## 2021-05-03 LAB — TROPONIN I (HIGH SENSITIVITY)
Troponin I (High Sensitivity): 87 ng/L — ABNORMAL HIGH (ref <18)
Troponin I (High Sensitivity): 81 ng/L — ABNORMAL HIGH (ref <18)

## 2021-05-03 LAB — CORTISOL: Cortisol, Plasma: 20.6 ug/dL

## 2021-05-03 LAB — TSH: TSH: 0.947 u[IU]/mL (ref 0.350–4.500)

## 2021-05-03 MED ORDER — LACTATED RINGERS IV BOLUS
1000.0000 mL | Freq: Once | INTRAVENOUS | Status: AC
  Administered 2021-05-03: 1000 mL via INTRAVENOUS

## 2021-05-03 MED ORDER — HYDRALAZINE HCL 20 MG/ML IJ SOLN
10.0000 mg | INTRAMUSCULAR | Status: DC | PRN
  Administered 2021-05-04 – 2021-05-05 (×2): 10 mg via INTRAVENOUS
  Filled 2021-05-03 (×2): qty 1

## 2021-05-03 MED ORDER — APIXABAN 5 MG PO TABS
10.0000 mg | ORAL_TABLET | Freq: Two times a day (BID) | ORAL | Status: DC
  Administered 2021-05-03 – 2021-05-05 (×5): 10 mg via ORAL
  Filled 2021-05-03 (×5): qty 2

## 2021-05-03 MED ORDER — IOHEXOL 350 MG/ML SOLN
60.0000 mL | Freq: Once | INTRAVENOUS | Status: AC | PRN
  Administered 2021-05-03: 60 mL via INTRAVENOUS

## 2021-05-03 MED ORDER — APIXABAN 5 MG PO TABS
5.0000 mg | ORAL_TABLET | Freq: Two times a day (BID) | ORAL | Status: DC
Start: 2021-05-10 — End: 2021-05-05

## 2021-05-03 NOTE — TOC Progression Note (Addendum)
Transition of Care [facility]) - Progression Note    Patient Details  Name: Jamie Anderson MRN: [id number] Date of Birth: Nov 20, 1935  Transition of Care [facility]) CM/SW Contact  Adelene Amas, [place] Phone Number: 05/03/2021, 1:32 PM  Clinical Narrative:     CSW contact patient's daughter/main contact Dustin Folks [phone], with update on pending authorization from [facility]  Plan for d/c is [facility], once insurance Josem Kaufmann is approved. CSW updated Seth Bake at [facility] of insurance authorization delay.  Expected Discharge Plan: [place] Barriers to Discharge: No Barriers Identified  Expected Discharge Plan and Services Expected Discharge Plan: [place] Choice: [place] arrangements for the past 2 months: Single Family Home                                       Social Determinants of Health (SDOH) Interventions    Readmission Risk Interventions No flowsheet data found.

## 2021-05-03 NOTE — Progress Notes (Signed)
Patient brought to room 259 for Afib RVR per EKG.  HR 127 when placed on tele. Running 130-160's.  Asymptomatic and other VSS. Started 1 L  LR bolus over 2 hours.  Gave PO elequis. Patient went for CT chest with transport.  Upon arrival back to floor patient is NSR in the 90's.  MD aware.

## 2021-05-03 NOTE — Plan of Care (Signed)
°  Problem: Health Behavior/Discharge Planning: Goal: Ability to manage health-related needs will improve Outcome: Progressing   Problem: Clinical Measurements: Goal: Will remain free from infection Outcome: Progressing   Problem: Clinical Measurements: Goal: Respiratory complications will improve Outcome: Progressing   Problem: Clinical Measurements: Goal: Cardiovascular complication will be avoided Outcome: Progressing   Problem: Coping: Goal: Level of anxiety will decrease Outcome: Progressing   Problem: Elimination: Goal: Will not experience complications related to bowel motility Outcome: Progressing   Problem: Pain Managment: Goal: General experience of comfort will improve Outcome: Progressing   Problem: Skin Integrity: Goal: Risk for impaired skin integrity will decrease Outcome: Progressing

## 2021-05-03 NOTE — Consult Note (Addendum)
[facility] Medical Consultation  Jamie Anderson LKG:[id number] DOB: 07/27/1935 DOA: 04/29/2021 PCP: Crecencio Mc, MD   Requesting physician: Dr. Mack Guise Date of consultation: May 03, 2021. Reason for consultation: Orthostatic hypotension.  Impression/Recommendations Principal Problem:   S/P TKR (total knee replacement) using cement, right Active Problems:   Orthostatic hypotension    A-fib with RVR -after patient's EKG was done patient was found to be in A-fib with RVR.  Patient's rhythm converted to sinus rhythm without any intervention.  We will check TSH cardiac markers 2D echo.  Patient's CHA2DS2-VASc score is at least 2.  On Eliquis now.  Will notify cardiology. Acute DVT of the right lower extremity -Dopplers ordered showed DVT of the right lower extremity involving the popliteal and calf veins.  Patient has been started on Eliquis after discussing with orthopedics.  CT angiogram of the chest was negative for PE. Orthostatic hypotension -patient's blood pressure dropped by almost 30 points after patient's blood pressure was checked this morning.  But was persistently tachycardic.  On checking the EKG patient was found to be in A-fib with RVR.  Likely causing patient's symptoms.  For now we will hold off patient's ARB but continue Toprol.  We will keep patient n.p.o. and IV hydralazine. Acute on chronic kidney disease stage III creatinine mildly worsened from baseline.  Giving fluids and holding ARB for now. Hypertension -holding ARB due to orthostatic hypotension.  Patient does take metoprolol but at a lower dose.  We will continue that for now.  Recheck orthostatic vital signs after fluid. Anemia hemoglobin is stable from recent past.  Follow CBC.  We will be repeating CBC after fluid. History of hyperlipidemia on statins and Zetia. History of bronchiectasis and bronchitis presently not wheezing. Patient does have some nausea but was able to tolerate the morning diet.   Abdomen appears benign we will continue to monitor.  Patient's daughter notified about patient's condition and plan.  I will followup again tomorrow. Please contact me if I can be of assistance in the meanwhile. Thank you for this consultation.  Chief Complaint: Admitted for total right knee placement.  HPI:  History of hypertension, hyperlipidemia, bronchiectasis, chronic and disease stage III was admitted about 4 days ago for total knee replacement.  Patient states over the last 2 days she has not been eating well because of poor appetite and mild nausea but denies any abdominal pain.  She did have a bowel moan yesterday after laxatives.  This morning when patient was attempted to ambulate was found to be weak and orthostatic with blood pressure dropping from 664 systolic to 98 systolic with heart rate stable around 124.  Hospitalist was consulted for orthostatic hypotension.  Patient denies any chest pain shortness of breath did have some nausea denies abdominal pain did have a loose bowel after laxatives yesterday.  Addendum -after EKG done it was found that patient was in A-fib with RVR.  Patient converted back to sinus rhythm without any intervention.  Since patient also had his right lower extremity swelling Dopplers were done which showed DVT of the right popliteal and calf veins.  Which were acute.  Review of Systems:  As presented in the history of present illness nothing else significant.  Past Medical History:  Diagnosis Date   Arthritis    Asthma    Basal cell carcinoma 01/17/2018   Left chest. Infiltrative. Excised 02/06/2018, margins free.   Cystic teratoma of right ovary 11/23/2012   She underwent right salpingo-oophorectomy on April  1 by Dr. Kenton Kingfisher for removal of what turned out to be a cystic teratoma. Hysteroscopy was also done at the time due to postmenopausal bleeding. It is unclear  why the left ovary was not taken out since she is postmenopausal.    Dyspnea    GERD  (gastroesophageal reflux disease)    Granuloma annulare    Hyperlipidemia    Hypertension    Menopause    Plantar fasciitis of right foot 12/24/2014   SCC (squamous cell carcinoma) 10/08/2020   R mid lower pretibia, EDC   SCC (squamous cell carcinoma) 03/16/2021   R upper pretibial, needs EDC pt sched 06/02/21   Squamous cell carcinoma of skin 02/26/2020   R lower pretibial, clear with bx 04/01/20   Squamous cell carcinoma of skin 07/15/2020   R mid pretibia, EDC   Squamous cell carcinoma of skin 12/09/2020   R lateral pretibia, EDC   Stage 3b chronic kidney disease (CKD) ([place])    Past Surgical History:  Procedure Laterality Date   APPENDECTOMY     CATARACT EXTRACTION, BILATERAL     DILATION AND CURETTAGE OF UTERUS     RIGHT OOPHORECTOMY Right 04/08/2012   [name]   SPINE SURGERY  03/08/2008   TOTAL KNEE ARTHROPLASTY Right 04/29/2021   Procedure: TOTAL KNEE ARTHROPLASTY;  Surgeon: Lovell Sheehan, MD;  Location: [facility] ORS;  Service: Orthopedics;  Laterality: Right;   VENOUS ABLATION     bilateral   Social History:  reports that she has never smoked. She has never used smokeless tobacco. She reports that she does not drink alcohol and does not use drugs.  Allergies  Allergen Reactions   Clarithromycin     Unknown   Colloidal Oatmeal     Unknown reaction   Dust Mite Extract    Oat     Unknown reaction   Other     Almonds - unknown reaction Mold Spores   Shrimp [Shellfish Allergy]     Unknown reaction    Family History  Problem Relation Age of Onset   Early death Mother        durig childbirth   Heart disease Father    Heart attack Father    Breast cancer Sister 66       half sister   Heart disease Sister    Cancer Brother    Heart disease Brother        Afib    Prior to Admission medications   Medication Sig Start Date End Date Taking? Authorizing Provider  acetaminophen (TYLENOL) 500 MG tablet Take 500 mg by mouth every 6 (six) hours as needed for moderate  pain.   Yes [provider]  albuterol (VENTOLIN HFA) 108 (90 Base) MCG/ACT inhaler Inhale 1 puff into the lungs every 6 (six) hours as needed for wheezing or shortness of breath. 08/18/20  Yes Flinchum, Kelby Aline, FNP  allopurinol (ZYLOPRIM) 100 MG tablet TAKE 1 TABLET BY MOUTH  DAILY 11/05/20  Yes Crecencio Mc, MD  aspirin EC 81 MG tablet Take 1 tablet (81 mg total) by mouth daily. Patient taking differently: Take 81 mg by mouth every other day. 05/25/19  Yes Loel Dubonnet, NP  calcium carbonate (TUMS - DOSED IN MG ELEMENTAL CALCIUM) 500 MG chewable tablet Chew 1 tablet by mouth daily as needed for indigestion or heartburn.   Yes [provider]  cetirizine (ZYRTEC) 10 MG tablet Take 10 mg by mouth daily as needed for allergies.   Yes [provider]  clobetasol cream (TEMOVATE) 0.05 % Apply to itchy rash on back 1-2 times a day until improved. Avoid face, groin, axilla. Patient taking differently: Apply 1 application topically 2 (two) times daily as needed (rash). Avoid face, groin, axilla. 10/08/20  Yes Brendolyn Patty, MD  Coenzyme Q10 (CO Q 10 PO) Take 1 tablet by mouth daily.   Yes [provider]  ezetimibe (ZETIA) 10 MG tablet TAKE 1 TABLET BY MOUTH  DAILY 01/23/21  Yes Crecencio Mc, MD  fluocinonide cream (LIDEX) 9.81 % Apply 1 application topically 2 (two) times daily as needed (itching).   Yes [provider]  fluticasone (FLONASE) 50 MCG/ACT nasal spray Place 2 sprays into both nostrils daily. Patient taking differently: Place 2 sprays into both nostrils daily as needed for allergies. 06/13/20  Yes Crecencio Mc, MD  fluticasone-salmeterol (ADVAIR) 100-50 MCG/ACT AEPB Inhale 1 puff into the lungs 2 (two) times daily. Rinse mouth after each use. Patient taking differently: Inhale 1 puff into the lungs 2 (two) times daily as needed (shortness of breath). Rinse mouth after each use. 08/18/20  Yes Flinchum, Kelby Aline, FNP  Menthol, Topical  Analgesic, (ZIMS MAX-FREEZE EX) Apply 1 application topically daily as needed (pain).   Yes [provider]  Menthol-Methyl Salicylate (MUSCLE RUB EX) Apply 1 application topically daily as needed (pain).   Yes [provider]  metoprolol succinate (TOPROL-XL) 25 MG 24 hr tablet TAKE 1 TABLET BY MOUTH  DAILY Patient taking differently: Take 25 mg by mouth at bedtime. 09/04/20  Yes Crecencio Mc, MD  mupirocin ointment (BACTROBAN) 2 % Apply 1 application topically 2 (two) times daily as needed (itching).   Yes [provider]  nystatin (MYCOSTATIN) 100000 UNIT/ML suspension Take 5 mLs (500,000 Units total) by mouth 4 (four) times daily. Patient taking differently: Take 5 mLs by mouth 4 (four) times daily as needed (when using the inhaler). 09/22/20  Yes Kasa, Maretta Bees, MD  pantoprazole (PROTONIX) 40 MG tablet TAKE 1 TABLET BY MOUTH  DAILY 01/23/21  Yes Crecencio Mc, MD  PREMARIN vaginal cream Place 1 Applicatorful vaginally 2 (two) times a week. Patient taking differently: Place 1 applicator vaginally daily as needed (dryness / irrtation). 01/04/19  Yes Crecencio Mc, MD  sertraline (ZOLOFT) 100 MG tablet TAKE 1 TABLET BY MOUTH  DAILY 09/04/20  Yes Crecencio Mc, MD  simvastatin (ZOCOR) 40 MG tablet Take 1 tablet (40 mg total) by mouth at bedtime. 02/02/21  Yes Crecencio Mc, MD  telmisartan (MICARDIS) 80 MG tablet Take 1 tablet (80 mg total) by mouth daily. Patient taking differently: Take 80 mg by mouth at bedtime. 02/02/21  Yes Crecencio Mc, MD  traMADol (ULTRAM) 50 MG tablet Take 1 tablet (50 mg total) by mouth every 12 (twelve) hours as needed for moderate pain. Patient taking differently: Take 25-50 mg by mouth every 12 (twelve) hours as needed for moderate pain. 02/05/21  Yes Crecencio Mc, MD  trolamine salicylate (ASPERCREME) 10 % cream Apply 1 application topically as needed for muscle pain.   Yes [provider]  VITAMIN D PO Take 1 capsule by  mouth daily.   Yes [provider]  acetaminophen (TYLENOL) 325 MG tablet Take 1-2 tablets (325-650 mg total) by mouth every 6 (six) hours as needed for mild pain (pain score 1-3 or temp > 100.5). 05/02/21   Thornton Park, MD  aspirin 81 MG chewable tablet Chew 1 tablet (81 mg total) by mouth  2 (two) times daily. 05/02/21   Thornton Park, MD  docusate sodium (COLACE) 100 MG capsule Take 1 capsule (100 mg total) by mouth 2 (two) times daily. 05/02/21   Thornton Park, MD  HYDROcodone-acetaminophen (NORCO/VICODIN) 5-325 MG tablet Take 1-2 tablets by mouth every 4 (four) hours as needed for moderate pain (pain score 4-6). 05/02/21   Thornton Park, MD  methocarbamol (ROBAXIN) 500 MG tablet Take 1 tablet (500 mg total) by mouth every 6 (six) hours as needed for muscle spasms. 05/02/21   Thornton Park, MD   Physical Exam: Blood pressure 126/68, pulse 81, temperature 97.7 F (36.5 C), resp. rate 16, height 5\' 4"  (1.626 m), weight 85.7 kg, SpO2 98 %. Vitals:   05/03/21 0458 05/03/21 0804  BP: (!) 159/74 126/68  Pulse: 91 81  Resp: 20 16  Temp: 99.7 F (37.6 C) 97.7 F (36.5 C)  SpO2: 99% 98%    General: Moderately built and nourished. Eyes: Anicteric no pallor. ENT: No discharge from the ears eyes nose or mouth. Neck: No JVD appreciated no mass or. Cardiovascular: S1-S2 heard. Respiratory: No rhonchi or crepitations. Abdomen: Soft nontender bowel sound present. Skin: No rash. Musculoskeletal: Edema of the right lower extremity. Psychiatric: Appears normal.  Normal affect. Neurologic: Alert awake oriented time place and person.  Moves all extremities.  Labs on Admission:  Basic Metabolic Panel: Recent Labs  Lab 04/30/21 0601 05/03/21 1215  NA 135 134*  K 4.5 4.3  CL 104 97*  CO2 24 26  GLUCOSE 135* 127*  BUN 19 34*  CREATININE 1.12* 1.30*  CALCIUM 9.1 9.4   Liver Function Tests: No results for input(s): AST, ALT, ALKPHOS, BILITOT, PROT, ALBUMIN in the last  168 hours. No results for input(s): LIPASE, AMYLASE in the last 168 hours. No results for input(s): AMMONIA in the last 168 hours. CBC: Recent Labs  Lab 04/30/21 0601 05/01/21 0603 05/03/21 1215  WBC 12.6* 9.3 11.7*  HGB 10.6* 10.0* 10.8*  HCT 32.8* 31.0* 32.9*  MCV 87.0 87.8 85.9  PLT 167 149* 221   Cardiac Enzymes: No results for input(s): CKTOTAL, CKMB, CKMBINDEX, TROPONINI in the last 168 hours. BNP: Invalid input(s): POCBNP CBG: No results for input(s): GLUCAP in the last 168 hours.  Radiological Exams on Admission: No results found.   Time spent: 50 minutes.  Rise Patience [facility]  If 7PM-7AM, please contact night-coverage [email or web address] Password [facility] 05/03/2021, 12:52 PM

## 2021-05-03 NOTE — Progress Notes (Signed)
Physical Therapy Treatment Patient Details Name: Jamie Anderson MRN: [id number] DOB: 01-15-1936 Today's Date: 05/03/2021   History of Present Illness Pt is an 86 y.o. female s/p R TKA 04/29/21 secondary to unilateral primary OA R knee.  PMH includes CKD, atypical chest pain, OCD, gout, SCC, htn, asthma, and spine surgery.    PT Comments    Pt in recliner reports feeling weak and general malaise.  Stated she felt dizzy and that she would pass out with staff this am.  Orthostatic BP's taken  supine 139/64 P 124  sitting 98/51 P 123, attempted standing but unable to remain up for full reading so reading just after sitting 103/60 P 126.  She reports no pain with deep breathing.  Seated AROM with emphasis with stretching but further activity deferred due to BP readings and increased HR.  Discussed with nursing and MD notified via secure chat.  Daughter in room.  Both updated and encouraged to call for nursing assist for transfers for safety.  Voiced understanding.  Pt has not been eating/drinking well per daughter the past day or so.   Recommendations for follow up therapy are one component of a multi-disciplinary discharge planning process, led by the attending physician.  Recommendations may be updated based on patient status, additional functional criteria and insurance authorization.  Follow Up Recommendations  Skilled nursing-short term rehab (<3 hours/day)     Assistance Recommended at Discharge Intermittent Supervision/Assistance  Patient can return home with the following A little help with walking and/or transfers;A little help with bathing/dressing/bathroom;Assistance with cooking/housework;Assist for transportation;Help with stairs or ramp for entrance   Equipment Recommendations  Rolling walker (2 wheels);BSC/3in1    Recommendations for Other Services       Precautions / Restrictions Precautions Precautions: Knee;Fall Restrictions Weight Bearing Restrictions: Yes RLE Weight  Bearing: Weight bearing as tolerated     Mobility  Bed Mobility               General bed mobility comments: In recliner pre/post session.    Transfers Overall transfer level: Needs assistance Equipment used: Rolling walker (2 wheels) Transfers: Sit to/from Stand     Step pivot transfers: Min guard       General transfer comment: cues for hand placements    Ambulation/Gait               General Gait Details: deferred due to BP   Stairs             Wheelchair Mobility    Modified Rankin (Stroke Patients Only)       Balance Overall balance assessment: Needs assistance Sitting-balance support: No upper extremity supported, Feet supported Sitting balance-Leahy Scale: Good     Standing balance support: During functional activity, Bilateral upper extremity supported Standing balance-Leahy Scale: Fair                              Cognition Arousal/Alertness: Awake/alert Behavior During Therapy: WFL for tasks assessed/performed Overall Cognitive Status: Within Functional Limits for tasks assessed                                          Exercises Other Exercises Other Exercises: seated arom with focus on flexion    General Comments        Pertinent Vitals/Pain Pain Assessment Pain Assessment: Faces Faces Pain  Scale: Hurts little more Pain Location: R knee Pain Descriptors / Indicators: Sore Pain Intervention(s): Limited activity within patient's tolerance, Monitored during session, Repositioned    Home Living                          Prior Function            PT Goals (current goals can now be found in the care plan section) Progress towards PT goals: Not progressing toward goals - comment    Frequency    BID      PT Plan Current plan remains appropriate    Co-evaluation              AM-PAC PT "6 Clicks" Mobility   Outcome Measure  Help needed turning from your back to  your side while in a flat bed without using bedrails?: None Help needed moving from lying on your back to sitting on the side of a flat bed without using bedrails?: None Help needed moving to and from a bed to a chair (including a wheelchair)?: A Little Help needed standing up from a chair using your arms (e.g., wheelchair or bedside chair)?: A Little Help needed to walk in hospital room?: A Little Help needed climbing 3-5 steps with a railing? : A Little 6 Click Score: 20    End of Session Equipment Utilized During Treatment: Gait belt Activity Tolerance: Treatment limited secondary to medical complications (Comment) Patient left: in chair;with call bell/phone within reach;with family/visitor present;with SCD's reapplied;Other (comment) Nurse Communication: Mobility status;Other (comment) PT Visit Diagnosis: Other abnormalities of gait and mobility (R26.89);Muscle weakness (generalized) (M62.81);Difficulty in walking, not elsewhere classified (R26.2);Pain Pain - Right/Left: Right Pain - part of body: Knee     Time: 1030-1048 PT Time Calculation (min) (ACUTE ONLY): 18 min  Charges:  $Therapeutic Activity: 8-22 mins                    Chesley Noon, PTA 05/03/21, 10:57 AM

## 2021-05-03 NOTE — TOC Progression Note (Signed)
Transition of Care [facility]) - Progression Note    Patient Details  Name: Jamie Anderson MRN: [id number] Date of Birth: 06/25/1935  Transition of Care [facility]) CM/SW [place], [place] Phone Number: [phone] 05/03/2021, 9:26 AM  Clinical Narrative:     Pending insurance auth for SNF placement.   Expected Discharge Plan: [place] Barriers to Discharge: No Barriers Identified  Expected Discharge Plan and Services Expected Discharge Plan: [place] Choice: [place] arrangements for the past 2 months: Single Family Home                                       Social Determinants of Health (SDOH) Interventions    Readmission Risk Interventions No flowsheet data found.

## 2021-05-03 NOTE — Progress Notes (Signed)
Subjective:  POD #4 s/p Right TKA.   Patient reports right knee pain as moderate.  Patient noted to have significant orthostatic hypotension with physical therapy today.  I ordered a hospitalist consult.  Patient was found to be in atrial fibrillation and has been transferred to the telemetry floor.  Patient had an ultrasound ordered for right lower extremity swelling which was positive for an occlusive DVT within the popliteal and calf veins.  Patient has been started on Eliquis for this.  Patient has had a CT angiogram which is negative for pulmonary embolism.  CBC showed a stable hemoglobin and hematocrit.  Objective:   VITALS:   Vitals:   05/03/21 0458 05/03/21 0804 05/03/21 1502 05/03/21 1612  BP: (!) 159/74 126/68 (!) 161/60 (!) 150/79  Pulse: 91 81 (!) 119 (!) 127  Resp: 20 16  20   Temp: 99.7 F (37.6 C) 97.7 F (36.5 C) 98.7 F (37.1 C) 98.5 F (36.9 C)  TempSrc:   Oral Oral  SpO2: 99% 98% 99% 98%  Weight:      Height:        PHYSICAL EXAM: Right lower extremity: Patient's dressing is clean dry and intact.  Polar Care is in place.  Patient has right lower extremity edema.  She is neurovascular intact.  Patient can dorsiflex and plantarflex her ankle and can flex and extend her toes.  Her knee is fully extended.   LABS  Results for orders placed or performed during the hospital encounter of 04/29/21 (from the past 24 hour(s))  CBC     Status: Abnormal   Collection Time: 05/03/21 12:15 PM  Result Value Ref Range   WBC 11.7 (H) 4.0 - 10.5 K/uL   RBC 3.83 (L) 3.87 - 5.11 MIL/uL   Hemoglobin 10.8 (L) 12.0 - 15.0 g/dL   HCT 32.9 (L) 36.0 - 46.0 %   MCV 85.9 80.0 - 100.0 fL   MCH 28.2 26.0 - 34.0 pg   MCHC 32.8 30.0 - 36.0 g/dL   RDW 15.0 11.5 - 15.5 %   Platelets 221 150 - 400 K/uL   nRBC 0.0 0.0 - 0.2 %  Basic metabolic panel     Status: Abnormal   Collection Time: 05/03/21 12:15 PM  Result Value Ref Range   Sodium 134 (L) 135 - 145 mmol/L   Potassium 4.3 3.5 -  5.1 mmol/L   Chloride 97 (L) 98 - 111 mmol/L   CO2 26 22 - 32 mmol/L   Glucose, Bld 127 (H) 70 - 99 mg/dL   BUN 34 (H) 8 - 23 mg/dL   Creatinine, Ser 1.30 (H) 0.44 - 1.00 mg/dL   Calcium 9.4 8.9 - 10.3 mg/dL   GFR, Estimated 41 (L) >60 mL/min   Anion gap 11 5 - 15  Lactic acid, plasma     Status: None   Collection Time: 05/03/21 12:15 PM  Result Value Ref Range   Lactic Acid, Venous 1.3 0.5 - 1.9 mmol/L  Lactic acid, plasma     Status: None   Collection Time: 05/03/21  3:27 PM  Result Value Ref Range   Lactic Acid, Venous 1.4 0.5 - 1.9 mmol/L  Brain natriuretic peptide     Status: Abnormal   Collection Time: 05/03/21  4:27 PM  Result Value Ref Range   B Natriuretic Peptide 223.0 (H) 0.0 - 100.0 pg/mL    CT Angio Chest Pulmonary Embolism (PE) W or WO Contrast  Result Date: 05/03/2021 CLINICAL DATA:  Pulmonary embolism suspected, high  probability. Postop knee surgery. EXAM: CT ANGIOGRAPHY CHEST WITH CONTRAST TECHNIQUE: Multidetector CT imaging of the chest was performed using the standard protocol during bolus administration of intravenous contrast. Multiplanar CT image reconstructions and MIPs were obtained to evaluate the vascular anatomy. RADIATION DOSE REDUCTION: This exam was performed according to the departmental dose-optimization program which includes automated exposure control, adjustment of the mA and/or kV according to patient size and/or use of iterative reconstruction technique. CONTRAST:  59mL OMNIPAQUE IOHEXOL 350 MG/ML SOLN COMPARISON:  Chest radiography 03/24/2021. Prior CT angiography 01/18/2017 FINDINGS: Cardiovascular: Heart size is normal. No pericardial effusion. Coronary artery calcification and aortic atherosclerotic calcification are present. Pulmonary arterial opacification is good. There are no pulmonary emboli. Mediastinum/Nodes: No mediastinal or hilar mass or lymphadenopathy. Lungs/Pleura: No pleural effusion. The lungs are clear. No infiltrate, mass, effusion or  collapse. Mild pleural and parenchymal scarring at the lung apices. Upper Abdomen: Negative Musculoskeletal: Ordinary mild thoracic degenerative changes. Review of the MIP images confirms the above findings. IMPRESSION: No pulmonary emboli or other acute chest pathology. Coronary artery calcification. Aortic atherosclerotic calcification. Ordinary mild pleural and parenchymal scarring at the lung apices. Electronically Signed   By: Nelson Chimes M.D.   On: 05/03/2021 17:19   US Venous Img Lower Bilateral (DVT)  Result Date: 05/03/2021 CLINICAL DATA:  Right greater than left lower extremity pain the past 4 days. History of right-sided knee replacement on 04/29/2021 EXAM: BILATERAL LOWER EXTREMITY VENOUS DOPPLER ULTRASOUND TECHNIQUE: Gray-scale sonography with graded compression, as well as color Doppler and duplex ultrasound were performed to evaluate the lower extremity deep venous systems from the level of the common femoral vein and including the common femoral, femoral, profunda femoral, popliteal and calf veins including the posterior tibial, peroneal and gastrocnemius veins when visible. The superficial great saphenous vein was also interrogated. Spectral Doppler was utilized to evaluate flow at rest and with distal augmentation maneuvers in the common femoral, femoral and popliteal veins. COMPARISON:  None. FINDINGS: RIGHT LOWER EXTREMITY Common Femoral Vein: No evidence of thrombus. Normal compressibility, respiratory phasicity and response to augmentation. Saphenofemoral Junction: No evidence of thrombus. Normal compressibility and flow on color Doppler imaging. Profunda Femoral Vein: No evidence of thrombus. Normal compressibility and flow on color Doppler imaging. Femoral Vein: No evidence of thrombus. Normal compressibility, respiratory phasicity and response to augmentation. Popliteal Vein: The popliteal vein is not compressible. The lumen is expanded and filled with low-level echoes. No evidence of  color flow on color Doppler imaging. Thrombus extends into the posterior tibial and peroneal veins in the upper calf. Calf Veins: Thrombus extends into the posterior tibial and peroneal veins in the upper calf. Superficial Great Saphenous Vein: No evidence of thrombus. Normal compressibility. Venous Reflux:  None. Other Findings:  None. LEFT LOWER EXTREMITY Common Femoral Vein: No evidence of thrombus. Normal compressibility, respiratory phasicity and response to augmentation. Saphenofemoral Junction: No evidence of thrombus. Normal compressibility and flow on color Doppler imaging. Profunda Femoral Vein: No evidence of thrombus. Normal compressibility and flow on color Doppler imaging. Femoral Vein: No evidence of thrombus. Normal compressibility, respiratory phasicity and response to augmentation. Popliteal Vein: No evidence of thrombus. Normal compressibility, respiratory phasicity and response to augmentation. Calf Veins: No evidence of thrombus. Normal compressibility and flow on color Doppler imaging. Superficial Great Saphenous Vein: No evidence of thrombus. Normal compressibility. Venous Reflux:  None. Other Findings:  None. IMPRESSION: Positive for acute occlusive DVT within the popliteal and calf veins. These results will be called to the ordering clinician  or representative by the Radiologist Assistant, and communication documented in the PACS or [employer]. Electronically Signed   By: Jacqulynn Cadet M.D.   On: 05/03/2021 15:03    Assessment/Plan: 4 Days Post-Op   Principal Problem:   S/P TKR (total knee replacement) using cement, right Active Problems:   Orthostatic hypotension  Patient is receiving IV fluid bolus and ARB is being held for the orthostatic hypotension.  Patient has slightly elevated creatinine.  Metoprolol is being continued.  Patient will continue physical therapy when medically appropriate.  Patient awaiting insurance approval for skilled nursing facility.  Patient on  Eliquis.    [place] , MD 05/03/2021, 5:42 PM

## 2021-05-04 DIAGNOSIS — I82461 Acute embolism and thrombosis of right calf muscular vein: Secondary | ICD-10-CM | POA: Clinically undetermined

## 2021-05-04 DIAGNOSIS — R6889 Other general symptoms and signs: Secondary | ICD-10-CM | POA: Diagnosis not present

## 2021-05-04 DIAGNOSIS — K219 Gastro-esophageal reflux disease without esophagitis: Secondary | ICD-10-CM | POA: Diagnosis not present

## 2021-05-04 DIAGNOSIS — I480 Paroxysmal atrial fibrillation: Secondary | ICD-10-CM | POA: Diagnosis not present

## 2021-05-04 DIAGNOSIS — Z7401 Bed confinement status: Secondary | ICD-10-CM | POA: Diagnosis not present

## 2021-05-04 DIAGNOSIS — Z20822 Contact with and (suspected) exposure to covid-19: Secondary | ICD-10-CM | POA: Diagnosis present

## 2021-05-04 DIAGNOSIS — M1711 Unilateral primary osteoarthritis, right knee: Secondary | ICD-10-CM | POA: Diagnosis not present

## 2021-05-04 DIAGNOSIS — I129 Hypertensive chronic kidney disease with stage 1 through stage 4 chronic kidney disease, or unspecified chronic kidney disease: Secondary | ICD-10-CM | POA: Diagnosis present

## 2021-05-04 DIAGNOSIS — Z96659 Presence of unspecified artificial knee joint: Secondary | ICD-10-CM

## 2021-05-04 DIAGNOSIS — M109 Gout, unspecified: Secondary | ICD-10-CM | POA: Diagnosis not present

## 2021-05-04 DIAGNOSIS — I951 Orthostatic hypotension: Secondary | ICD-10-CM | POA: Diagnosis not present

## 2021-05-04 DIAGNOSIS — I82401 Acute embolism and thrombosis of unspecified deep veins of right lower extremity: Secondary | ICD-10-CM | POA: Diagnosis not present

## 2021-05-04 DIAGNOSIS — F39 Unspecified mood [affective] disorder: Secondary | ICD-10-CM | POA: Diagnosis not present

## 2021-05-04 DIAGNOSIS — R7989 Other specified abnormal findings of blood chemistry: Secondary | ICD-10-CM | POA: Diagnosis not present

## 2021-05-04 DIAGNOSIS — J479 Bronchiectasis, uncomplicated: Secondary | ICD-10-CM | POA: Diagnosis not present

## 2021-05-04 DIAGNOSIS — Z741 Need for assistance with personal care: Secondary | ICD-10-CM | POA: Diagnosis not present

## 2021-05-04 DIAGNOSIS — I10 Essential (primary) hypertension: Secondary | ICD-10-CM | POA: Diagnosis not present

## 2021-05-04 DIAGNOSIS — D631 Anemia in chronic kidney disease: Secondary | ICD-10-CM | POA: Diagnosis present

## 2021-05-04 DIAGNOSIS — Z96651 Presence of right artificial knee joint: Secondary | ICD-10-CM | POA: Diagnosis not present

## 2021-05-04 DIAGNOSIS — R531 Weakness: Secondary | ICD-10-CM | POA: Diagnosis not present

## 2021-05-04 DIAGNOSIS — R278 Other lack of coordination: Secondary | ICD-10-CM | POA: Diagnosis not present

## 2021-05-04 DIAGNOSIS — J45909 Unspecified asthma, uncomplicated: Secondary | ICD-10-CM | POA: Diagnosis present

## 2021-05-04 DIAGNOSIS — R197 Diarrhea, unspecified: Secondary | ICD-10-CM | POA: Diagnosis not present

## 2021-05-04 DIAGNOSIS — Z85828 Personal history of other malignant neoplasm of skin: Secondary | ICD-10-CM | POA: Diagnosis not present

## 2021-05-04 DIAGNOSIS — M6281 Muscle weakness (generalized): Secondary | ICD-10-CM | POA: Diagnosis not present

## 2021-05-04 DIAGNOSIS — E785 Hyperlipidemia, unspecified: Secondary | ICD-10-CM | POA: Diagnosis present

## 2021-05-04 DIAGNOSIS — R110 Nausea: Secondary | ICD-10-CM | POA: Diagnosis not present

## 2021-05-04 DIAGNOSIS — Z471 Aftercare following joint replacement surgery: Secondary | ICD-10-CM | POA: Diagnosis not present

## 2021-05-04 DIAGNOSIS — I82431 Acute embolism and thrombosis of right popliteal vein: Secondary | ICD-10-CM | POA: Diagnosis not present

## 2021-05-04 DIAGNOSIS — N1832 Chronic kidney disease, stage 3b: Secondary | ICD-10-CM | POA: Diagnosis not present

## 2021-05-04 HISTORY — DX: Acute embolism and thrombosis of right calf muscular vein: I82.461

## 2021-05-04 LAB — CBC WITH DIFFERENTIAL/PLATELET
Abs Immature Granulocytes: 0.03 10*3/uL (ref 0.00–0.07)
Basophils Absolute: 0 10*3/uL (ref 0.0–0.1)
Basophils Relative: 0 %
Eosinophils Absolute: 0.1 10*3/uL (ref 0.0–0.5)
Eosinophils Relative: 1 %
HCT: 28.5 % — ABNORMAL LOW (ref 36.0–46.0)
Hemoglobin: 9.6 g/dL — ABNORMAL LOW (ref 12.0–15.0)
Immature Granulocytes: 0 %
Lymphocytes Relative: 14 %
Lymphs Abs: 1 10*3/uL (ref 0.7–4.0)
MCH: 28.8 pg (ref 26.0–34.0)
MCHC: 33.7 g/dL (ref 30.0–36.0)
MCV: 85.6 fL (ref 80.0–100.0)
Monocytes Absolute: 0.7 10*3/uL (ref 0.1–1.0)
Monocytes Relative: 9 %
Neutro Abs: 5.7 10*3/uL (ref 1.7–7.7)
Neutrophils Relative %: 76 %
Platelets: 201 10*3/uL (ref 150–400)
RBC: 3.33 MIL/uL — ABNORMAL LOW (ref 3.87–5.11)
RDW: 14.8 % (ref 11.5–15.5)
WBC: 7.6 10*3/uL (ref 4.0–10.5)
nRBC: 0 % (ref 0.0–0.2)

## 2021-05-04 LAB — COMPREHENSIVE METABOLIC PANEL
ALT: 18 U/L (ref 0–44)
AST: 25 U/L (ref 15–41)
Albumin: 3 g/dL — ABNORMAL LOW (ref 3.5–5.0)
Alkaline Phosphatase: 71 U/L (ref 38–126)
Anion gap: 13 (ref 5–15)
BUN: 28 mg/dL — ABNORMAL HIGH (ref 8–23)
CO2: 24 mmol/L (ref 22–32)
Calcium: 9.2 mg/dL (ref 8.9–10.3)
Chloride: 99 mmol/L (ref 98–111)
Creatinine, Ser: 0.91 mg/dL (ref 0.44–1.00)
GFR, Estimated: >60 mL/min (ref >60)
Glucose, Bld: 102 mg/dL — ABNORMAL HIGH (ref 70–99)
Potassium: 3.9 mmol/L (ref 3.5–5.1)
Sodium: 136 mmol/L (ref 135–145)
Total Bilirubin: 0.7 mg/dL (ref 0.3–1.2)
Total Protein: 5.9 g/dL — ABNORMAL LOW (ref 6.5–8.1)

## 2021-05-04 LAB — GLUCOSE, CAPILLARY
Glucose-Capillary: 110 mg/dL — ABNORMAL HIGH (ref 70–99)
Glucose-Capillary: 114 mg/dL — ABNORMAL HIGH (ref 70–99)

## 2021-05-04 MED ORDER — FLUTICASONE PROPIONATE 50 MCG/ACT NA SUSP
2.0000 | Freq: Every day | NASAL | Status: DC
  Administered 2021-05-04 – 2021-05-05 (×2): 2 via NASAL
  Filled 2021-05-04: qty 16

## 2021-05-04 MED ORDER — IRBESARTAN 150 MG PO TABS
75.0000 mg | ORAL_TABLET | Freq: Every day | ORAL | Status: DC
  Administered 2021-05-04: 75 mg via ORAL
  Filled 2021-05-04: qty 1

## 2021-05-04 NOTE — Assessment & Plan Note (Signed)
Started on Eliquis loading dose 10 mg twice daily x7 days then 5 mg twice daily.  CTA chest negative for PE and patient has free of dyspnea or pleuritic chest pain.

## 2021-05-04 NOTE — Hospital Course (Signed)
86 year old female past medical history of hypertension, hyperlipidemia, bronchiectasis, CKD stage III who was admitted to orthopedic service for total knee replacement on 04/29/2021.  Hospitalist service consulted due to orthostatic hypotension and A-fib RVR.  Patient was subsequently found to have a right lower extremity DVT involving the popliteal and calf veins and started on Eliquis.  CTA chest was negative for PE and patient is free of dyspnea or pleuritic chest pain.  2/27: Repeat orthostatic vitals today were normal.  A-fib appears paroxysmal, she had an isolated episode she spontaneously converted to normal sinus rhythm.  Medically stable for discharge to SNF/rehab per Ortho and TOC.     Continue PT and OT while here. Continue telemetry monitoring while here. Recheck orthostatic vitals if symptoms recur.

## 2021-05-04 NOTE — Assessment & Plan Note (Signed)
On 2/26 working with PT, patient felt dizzy when upright.  Orthostatic vitals were positive with drop in blood pressure from 139/64 down to 98/51 sitting with heart rate in the 120s.  Patient was reportedly having diarrhea that day as well and found to be in A-fib with RVR. -- ARB is on hold -- Repeat orthostatics this morning were negative -- Resting BPs are elevated today, resume lower dose ARB and monitor -- Orthostatics every shift

## 2021-05-04 NOTE — Progress Notes (Signed)
Physical Therapy Treatment Patient Details Name: Jamie Anderson MRN: [id number] DOB: 10-29-1935 Today's Date: 05/04/2021   History of Present Illness Pt is an 86 y.o. female s/p R TKA 04/29/21 secondary to unilateral primary OA R knee.  PMH includes CKD, atypical chest pain, OCD, gout, SCC, htn, asthma, and spine surgery.    PT Comments    Co-tx with OT for pt safety as dizziness limited gait this am and grandson needed to bring chair.  1 unit billed each per protocol.  Stood and was able to walk to/from commode with RW and min guard x 2.  See OT for toileting/balance details.  She is not able to walk further at this time due to general fatigue.  She denies dizziness this session.  Returned to supine with min assist.  Cues and education for supine HEP with emphasis on quad sets.    SNF remains appropriate for transition.  She is not walking household distances at this time and will benefit from increased PT and OT interventions for a successful transition home.     Recommendations for follow up therapy are one component of a multi-disciplinary discharge planning process, led by the attending physician.  Recommendations may be updated based on patient status, additional functional criteria and insurance authorization.  Follow Up Recommendations  Skilled nursing-short term rehab (<3 hours/day)     Assistance Recommended at Discharge Intermittent Supervision/Assistance  Patient can return home with the following A little help with walking and/or transfers;A little help with bathing/dressing/bathroom;Assistance with cooking/housework;Assist for transportation;Help with stairs or ramp for entrance   Equipment Recommendations  Rolling walker (2 wheels);BSC/3in1    Recommendations for Other Services       Precautions / Restrictions Precautions Precautions: Knee;Fall Precaution Comments: orthostatic Sunday 2/26 Restrictions Weight Bearing Restrictions: Yes RLE Weight Bearing: Weight  bearing as tolerated     Mobility  Bed Mobility Overal bed mobility: Needs Assistance Bed Mobility: Sit to Supine     Supine to sit: Supervision Sit to supine: Min assist        Transfers Overall transfer level: Needs assistance Equipment used: Rolling walker (2 wheels) Transfers: Sit to/from Stand Sit to Stand: Min guard, +2 safety/equipment           General transfer comment: VC for hand placement, heavy reliance on RW; Pt unable to make it 15' to the bathroom so BSC was brought to her. ~5' with RW and CGA +2 for safety to [facility].    Ambulation/Gait Ambulation/Gait assistance: Min guard Gait Distance (Feet): 5 Feet Assistive device: Rolling walker (2 wheels) Gait Pattern/deviations: Antalgic, Step-to pattern Gait velocity: decreased     General Gait Details: 5' x 2 limited by fatigue   Stairs             Wheelchair Mobility    Modified Rankin (Stroke Patients Only)       Balance Overall balance assessment: Needs assistance Sitting-balance support: No upper extremity supported, Feet supported Sitting balance-Leahy Scale: Good Sitting balance - Comments: steady sitting reaching outside BOS   Standing balance support: During functional activity, Bilateral upper extremity supported, No upper extremity supported, Single extremity supported Standing balance-Leahy Scale: Fair Standing balance comment: static standing with no UE vs UE vs BUE during pericare and clothing mgt after toileting                            Cognition Arousal/Alertness: Awake/alert Behavior During Therapy: WFL for tasks assessed/performed Overall  Cognitive Status: Within Functional Limits for tasks assessed                                          Exercises Total Joint Exercises Ankle Circles/Pumps: AROM, Strengthening, Both, 10 reps, Supine Quad Sets: AROM, Strengthening, Both, 10 reps, Supine Short Arc Quad: AAROM, Strengthening, Right, 10 reps,  Supine Heel Slides: AAROM, Strengthening, Right, 10 reps, Supine Hip ABduction/ADduction: AAROM, Strengthening, Right, 10 reps, Supine Straight Leg Raises: AROM, Strengthening, Right, 10 reps, Supine Goniometric ROM: 0-93    General Comments        Pertinent Vitals/Pain Pain Assessment Pain Assessment: Faces Faces Pain Scale: Hurts little more Pain Location: R knee (pt states "it's fine" but does visually demonstrate visual cues for pain) Pain Descriptors / Indicators: Grimacing, Guarding Pain Intervention(s): Limited activity within patient's tolerance, Monitored during session, Repositioned, Ice applied    Home Living                          Prior Function            PT Goals (current goals can now be found in the care plan section) Progress towards PT goals: Progressing toward goals    Frequency    BID      PT Plan Current plan remains appropriate    Co-evaluation PT/OT/SLP Co-Evaluation/Treatment: Yes Reason for Co-Treatment: For patient/therapist safety PT goals addressed during session: Mobility/safety with mobility;Strengthening/ROM OT goals addressed during session: ADL's and self-care      AM-PAC PT "6 Clicks" Mobility   Outcome Measure  Help needed turning from your back to your side while in a flat bed without using bedrails?: None Help needed moving from lying on your back to sitting on the side of a flat bed without using bedrails?: A Little Help needed moving to and from a bed to a chair (including a wheelchair)?: A Little Help needed standing up from a chair using your arms (e.g., wheelchair or bedside chair)?: A Little Help needed to walk in hospital room?: A Little Help needed climbing 3-5 steps with a railing? : A Little 6 Click Score: 19    End of Session Equipment Utilized During Treatment: Gait belt Activity Tolerance: Patient tolerated treatment well Patient left: in chair;with call bell/phone within reach;with  family/visitor present;Other (comment) Nurse Communication: Mobility status;Other (comment) PT Visit Diagnosis: Other abnormalities of gait and mobility (R26.89);Muscle weakness (generalized) (M62.81);Difficulty in walking, not elsewhere classified (R26.2);Pain Pain - Right/Left: Right Pain - part of body: Knee     Time: 0213-0236 PT Time Calculation (min) (ACUTE ONLY): 23 min  Charges:  $Gait Training: 8-22 mins $Therapeutic Exercise: 23-37 mins $Therapeutic Activity: 8-22 mins                    Chesley Noon, PTA 05/04/21, 3:06 PM

## 2021-05-04 NOTE — TOC Progression Note (Signed)
Transition of Care [facility]) - Progression Note    Patient Details  Name: Jamie Anderson MRN: [id number] Date of Birth: 1935-06-20  Transition of Care [facility]) CM/SW [place], RN Phone Number: 05/04/2021, 11:15 AM  Clinical Narrative:    Reviewed Everlene Balls and confirmed P2P requested: This case was sent to our medical director and are requesting P2P. Deadline is Monday 2/27 at 11 AM CST. Callback number for your provider to call back by deadline is [phone] opt 5. PROVIDERS ARE HERE ON WEEKENDS TO CONDUCT THESE CONVERSATIONS.   Expected Discharge Plan: [place] Barriers to Discharge: No Barriers Identified  Expected Discharge Plan and Services Expected Discharge Plan: [place] Choice: [place] arrangements for the past 2 months: Single Family Home                                       Social Determinants of Health (SDOH) Interventions    Readmission Risk Interventions No flowsheet data found.

## 2021-05-04 NOTE — TOC Progression Note (Addendum)
Transition of Care [facility]) - Progression Note    Patient Details  Name: Jamie Anderson MRN: [id number] Date of Birth: 1935/08/21  Transition of Care [facility]) CM/SW Contact  Anselm Pancoast, RN Phone Number: 05/04/2021, 2:09 PM  Clinical Narrative:    P2P completed and insurance requesting updated therapy notes be sent today by 3pm. Updated treatment team.   1502: Uploaded updated therapy notes.    Expected Discharge Plan: [place] Barriers to Discharge: No Barriers Identified  Expected Discharge Plan and Services Expected Discharge Plan: [place] Choice: [place] arrangements for the past 2 months: Single Family Home                                       Social Determinants of Health (SDOH) Interventions    Readmission Risk Interventions No flowsheet data found.

## 2021-05-04 NOTE — Progress Notes (Signed)
Subjective:  Patient reports pain as moderate.  Feels weak.  Objective:   VITALS:   Vitals:   05/04/21 0837 05/04/21 0851 05/04/21 0853 05/04/21 1216  BP: (!) 186/83 (!) 160/66 (!) 151/49   Pulse: 84 85 81   Resp:      Temp:      TempSrc:      SpO2:    98%  Weight:      Height:        PHYSICAL EXAM:  ABD soft Neurovascular intact Sensation intact distally Dorsiflexion/Plantar flexion intact Incision: dressing C/D/I No cellulitis present Calf is tender Unable to perform straight leg raise without assistance  LABS  Results for orders placed or performed during the hospital encounter of 04/29/21 (from the past 24 hour(s))  Lactic acid, plasma     Status: None   Collection Time: 05/03/21  3:27 PM  Result Value Ref Range   Lactic Acid, Venous 1.4 0.5 - 1.9 mmol/L  Troponin I (High Sensitivity)     Status: Abnormal   Collection Time: 05/03/21  4:27 PM  Result Value Ref Range   Troponin I (High Sensitivity) 87 (H) <18 ng/L  Brain natriuretic peptide     Status: Abnormal   Collection Time: 05/03/21  4:27 PM  Result Value Ref Range   B Natriuretic Peptide 223.0 (H) 0.0 - 100.0 pg/mL  TSH     Status: None   Collection Time: 05/03/21  4:27 PM  Result Value Ref Range   TSH 0.947 0.350 - 4.500 uIU/mL  Troponin I (High Sensitivity)     Status: Abnormal   Collection Time: 05/03/21  6:10 PM  Result Value Ref Range   Troponin I (High Sensitivity) 81 (H) <18 ng/L  Comprehensive metabolic panel     Status: Abnormal   Collection Time: 05/04/21  4:28 AM  Result Value Ref Range   Sodium 136 135 - 145 mmol/L   Potassium 3.9 3.5 - 5.1 mmol/L   Chloride 99 98 - 111 mmol/L   CO2 24 22 - 32 mmol/L   Glucose, Bld 60 (L) 70 - 99 mg/dL   BUN 28 (H) 8 - 23 mg/dL   Creatinine, Ser 0.91 0.44 - 1.00 mg/dL   Calcium 9.2 8.9 - 10.3 mg/dL   Total Protein 5.9 (L) 6.5 - 8.1 g/dL   Albumin 3.0 (L) 3.5 - 5.0 g/dL   AST 25 15 - 41 U/L   ALT 18 0 - 44 U/L   Alkaline Phosphatase 71 38 - 126  U/L   Total Bilirubin 0.7 0.3 - 1.2 mg/dL   GFR, Estimated >60 >60 mL/min   Anion gap 13 5 - 15  CBC with Differential/Platelet     Status: Abnormal   Collection Time: 05/04/21  4:28 AM  Result Value Ref Range   WBC 7.6 4.0 - 10.5 K/uL   RBC 3.33 (L) 3.87 - 5.11 MIL/uL   Hemoglobin 9.6 (L) 12.0 - 15.0 g/dL   HCT 28.5 (L) 36.0 - 46.0 %   MCV 85.6 80.0 - 100.0 fL   MCH 28.8 26.0 - 34.0 pg   MCHC 33.7 30.0 - 36.0 g/dL   RDW 14.8 11.5 - 15.5 %   Platelets 201 150 - 400 K/uL   nRBC 0.0 0.0 - 0.2 %   Neutrophils Relative % 76 %   Neutro Abs 5.7 1.7 - 7.7 K/uL   Lymphocytes Relative 14 %   Lymphs Abs 1.0 0.7 - 4.0 K/uL   Monocytes Relative 9 %  Monocytes Absolute 0.7 0.1 - 1.0 K/uL   Eosinophils Relative 1 %   Eosinophils Absolute 0.1 0.0 - 0.5 K/uL   Basophils Relative 0 %   Basophils Absolute 0.0 0.0 - 0.1 K/uL   Immature Granulocytes 0 %   Abs Immature Granulocytes 0.03 0.00 - 0.07 K/uL  Glucose, capillary     Status: Abnormal   Collection Time: 05/04/21  8:59 AM  Result Value Ref Range   Glucose-Capillary 110 (H) 70 - 99 mg/dL  Glucose, capillary     Status: Abnormal   Collection Time: 05/04/21  1:24 PM  Result Value Ref Range   Glucose-Capillary 114 (H) 70 - 99 mg/dL    CT Angio Chest Pulmonary Embolism (PE) W or WO Contrast  Result Date: 05/03/2021 CLINICAL DATA:  Pulmonary embolism suspected, high probability. Postop knee surgery. EXAM: CT ANGIOGRAPHY CHEST WITH CONTRAST TECHNIQUE: Multidetector CT imaging of the chest was performed using the standard protocol during bolus administration of intravenous contrast. Multiplanar CT image reconstructions and MIPs were obtained to evaluate the vascular anatomy. RADIATION DOSE REDUCTION: This exam was performed according to the departmental dose-optimization program which includes automated exposure control, adjustment of the mA and/or kV according to patient size and/or use of iterative reconstruction technique. CONTRAST:  66mL  OMNIPAQUE IOHEXOL 350 MG/ML SOLN COMPARISON:  Chest radiography 03/24/2021. Prior CT angiography 01/18/2017 FINDINGS: Cardiovascular: Heart size is normal. No pericardial effusion. Coronary artery calcification and aortic atherosclerotic calcification are present. Pulmonary arterial opacification is good. There are no pulmonary emboli. Mediastinum/Nodes: No mediastinal or hilar mass or lymphadenopathy. Lungs/Pleura: No pleural effusion. The lungs are clear. No infiltrate, mass, effusion or collapse. Mild pleural and parenchymal scarring at the lung apices. Upper Abdomen: Negative Musculoskeletal: Ordinary mild thoracic degenerative changes. Review of the MIP images confirms the above findings. IMPRESSION: No pulmonary emboli or other acute chest pathology. Coronary artery calcification. Aortic atherosclerotic calcification. Ordinary mild pleural and parenchymal scarring at the lung apices. Electronically Signed   By: Nelson Chimes M.D.   On: 05/03/2021 17:19   US Venous Img Lower Bilateral (DVT)  Result Date: 05/03/2021 CLINICAL DATA:  Right greater than left lower extremity pain the past 4 days. History of right-sided knee replacement on 04/29/2021 EXAM: BILATERAL LOWER EXTREMITY VENOUS DOPPLER ULTRASOUND TECHNIQUE: Gray-scale sonography with graded compression, as well as color Doppler and duplex ultrasound were performed to evaluate the lower extremity deep venous systems from the level of the common femoral vein and including the common femoral, femoral, profunda femoral, popliteal and calf veins including the posterior tibial, peroneal and gastrocnemius veins when visible. The superficial great saphenous vein was also interrogated. Spectral Doppler was utilized to evaluate flow at rest and with distal augmentation maneuvers in the common femoral, femoral and popliteal veins. COMPARISON:  None. FINDINGS: RIGHT LOWER EXTREMITY Common Femoral Vein: No evidence of thrombus. Normal compressibility, respiratory  phasicity and response to augmentation. Saphenofemoral Junction: No evidence of thrombus. Normal compressibility and flow on color Doppler imaging. Profunda Femoral Vein: No evidence of thrombus. Normal compressibility and flow on color Doppler imaging. Femoral Vein: No evidence of thrombus. Normal compressibility, respiratory phasicity and response to augmentation. Popliteal Vein: The popliteal vein is not compressible. The lumen is expanded and filled with low-level echoes. No evidence of color flow on color Doppler imaging. Thrombus extends into the posterior tibial and peroneal veins in the upper calf. Calf Veins: Thrombus extends into the posterior tibial and peroneal veins in the upper calf. Superficial Great Saphenous Vein: No evidence of  thrombus. Normal compressibility. Venous Reflux:  None. Other Findings:  None. LEFT LOWER EXTREMITY Common Femoral Vein: No evidence of thrombus. Normal compressibility, respiratory phasicity and response to augmentation. Saphenofemoral Junction: No evidence of thrombus. Normal compressibility and flow on color Doppler imaging. Profunda Femoral Vein: No evidence of thrombus. Normal compressibility and flow on color Doppler imaging. Femoral Vein: No evidence of thrombus. Normal compressibility, respiratory phasicity and response to augmentation. Popliteal Vein: No evidence of thrombus. Normal compressibility, respiratory phasicity and response to augmentation. Calf Veins: No evidence of thrombus. Normal compressibility and flow on color Doppler imaging. Superficial Great Saphenous Vein: No evidence of thrombus. Normal compressibility. Venous Reflux:  None. Other Findings:  None. IMPRESSION: Positive for acute occlusive DVT within the popliteal and calf veins. These results will be called to the ordering clinician or representative by the Radiologist Assistant, and communication documented in the PACS or [employer]. Electronically Signed   By: Jacqulynn Cadet M.D.    On: 05/03/2021 15:03    Assessment/Plan: 5 Days Post-Op   Principal Problem:   S/P TKR (total knee replacement) using cement, right Active Problems:   Orthostatic hypotension   S/P TKR (total knee replacement) Atrial Fibrillation Deep vein thrombosis  Up with therapy Discharge to SNF Appreciate hospitalist recommendations Continue Eliquis    Lovell Sheehan , MD 05/04/2021, 3:20 PM

## 2021-05-04 NOTE — Assessment & Plan Note (Addendum)
Surgery was on 04/29/2021.  Management per orthopedic surgery.  Continue PT and OT.  Pain control as needed.  Has right lower extremity DVT and started on Eliquis

## 2021-05-04 NOTE — Assessment & Plan Note (Signed)
Patient had symptomatic orthostatic hypotension on 2/26.  ARB was placed on hold and Toprol continued.  BPs today are elevated.  Resuming ARB at lowest dose (irbesartan as formulary substitute for home telmisartan).  Monitor blood pressures and adjust as needed if orthostatic symptoms recur. -- Advise close PCP follow-up after discharge -- Recommend close ambulatory BP monitoring after discharge

## 2021-05-04 NOTE — Progress Notes (Addendum)
Physical Therapy Treatment Patient Details Name: Jamie Anderson MRN: [id number] DOB: 1935-08-11 Today's Date: 05/04/2021   History of Present Illness Pt is an 86 y.o. female s/p R TKA 04/29/21 secondary to unilateral primary OA R knee.  PMH includes CKD, atypical chest pain, OCD, gout, SCC, htn, asthma, and spine surgery.    PT Comments    Pt ready for session.  Cleared by MD for therapy + for DVT and COT orders received due to transfer to higher level of care yesterday.  Orthostatic BP Supine 156/62 P 88 Sit 143/68 P 90 Stand 128/56 P 94 Stnd 3 148/68  She is able to get to EOB with time but no physical assist.  Stood with min guard x 1 and vc's for hand placements.  She is able to stand for complete orthostatic vitals today.  After short seated rest and ex  she is able to walk 10' x 2 in room.  She is limited by dizziness and chair is brought by grandson as she could not walk further.  BP 131/55 P 95.  After seated rest she walks back to recliner and remained up after session.  Supine ex x 10.  She is unable to do SLR RLE without assist.    Pt is progressing well towards goals today with overall improvement in activity and tolerance.  SNF remains appropriate for transition.  She continues to need +1 assist for all mobility and continues to endorse overall feeling of weakness and dizziness with mobility which affects safety upon discharge.  While pt had shown good progress initially new medical concerns have delayed progression of mobility as initially anticipated. She is unsafe to walk or transfer without assist at this time and will benefit from continued skilled PT services and nursing care.   Recommendations for follow up therapy are one component of a multi-disciplinary discharge planning process, led by the attending physician.  Recommendations may be updated based on patient status, additional functional criteria and insurance authorization.  Follow Up Recommendations  Skilled  nursing-short term rehab (<3 hours/day)     Assistance Recommended at Discharge Intermittent Supervision/Assistance  Patient can return home with the following A little help with walking and/or transfers;A little help with bathing/dressing/bathroom;Assistance with cooking/housework;Assist for transportation;Help with stairs or ramp for entrance   Equipment Recommendations  Rolling walker (2 wheels);BSC/3in1    Recommendations for Other Services       Precautions / Restrictions Precautions Precautions: Knee;Fall Precaution Comments: orthostatic Sunday 2/26 Restrictions Weight Bearing Restrictions: Yes RLE Weight Bearing: Weight bearing as tolerated     Mobility  Bed Mobility Overal bed mobility: Needs Assistance Bed Mobility: Supine to Sit     Supine to sit: Supervision          Transfers Overall transfer level: Needs assistance Equipment used: Rolling walker (2 wheels) Transfers: Sit to/from Stand Sit to Stand: Min guard                Ambulation/Gait Ambulation/Gait assistance: Min guard Gait Distance (Feet): 10 Feet Assistive device: Rolling walker (2 wheels) Gait Pattern/deviations: Antalgic, Step-to pattern Gait velocity: decreased     General Gait Details: 10' x 2 limited by dizziness and fatigue   Stairs             Wheelchair Mobility    Modified Rankin (Stroke Patients Only)       Balance Overall balance assessment: Needs assistance Sitting-balance support: No upper extremity supported, Feet supported Sitting balance-Leahy Scale: Good Sitting balance - Comments:  steady sitting reaching outside BOS   Standing balance support: During functional activity, Bilateral upper extremity supported Standing balance-Leahy Scale: Fair                              Cognition Arousal/Alertness: Awake/alert Behavior During Therapy: WFL for tasks assessed/performed Overall Cognitive Status: Within Functional Limits for tasks  assessed                                          Exercises Total Joint Exercises Ankle Circles/Pumps: AROM, Strengthening, Both, 10 reps, Supine Quad Sets: AROM, Strengthening, Both, 10 reps, Supine Short Arc Quad: AAROM, Strengthening, Right, 10 reps, Supine Heel Slides: AAROM, Strengthening, Right, 10 reps, Supine Hip ABduction/ADduction: AAROM, Strengthening, Right, 10 reps, Supine Straight Leg Raises: AROM, Strengthening, Right, 10 reps, Supine Goniometric ROM: 0-93    General Comments        Pertinent Vitals/Pain Pain Assessment Pain Assessment: Faces Faces Pain Scale: Hurts little more Pain Location: R knee Pain Descriptors / Indicators: Sore Pain Intervention(s): Limited activity within patient's tolerance, Monitored during session, Repositioned, Ice applied    Home Living                          Prior Function            PT Goals (current goals can now be found in the care plan section) Progress towards PT goals: Progressing toward goals    Frequency    BID      PT Plan Current plan remains appropriate    Co-evaluation              AM-PAC PT "6 Clicks" Mobility   Outcome Measure  Help needed turning from your back to your side while in a flat bed without using bedrails?: None Help needed moving from lying on your back to sitting on the side of a flat bed without using bedrails?: A Little Help needed moving to and from a bed to a chair (including a wheelchair)?: A Little Help needed standing up from a chair using your arms (e.g., wheelchair or bedside chair)?: A Little Help needed to walk in hospital room?: A Little Help needed climbing 3-5 steps with a railing? : A Little 6 Click Score: 19    End of Session Equipment Utilized During Treatment: Gait belt Activity Tolerance: Patient tolerated treatment well Patient left: in chair;with call bell/phone within reach;with family/visitor present;Other (comment) Nurse  Communication: Mobility status;Other (comment) PT Visit Diagnosis: Other abnormalities of gait and mobility (R26.89);Muscle weakness (generalized) (M62.81);Difficulty in walking, not elsewhere classified (R26.2);Pain Pain - Right/Left: Right Pain - part of body: Knee     Time: 1000-1055 PT Time Calculation (min) (ACUTE ONLY): 55 min  Charges:  $Gait Training: 8-22 mins $Therapeutic Exercise: 23-37 mins $Therapeutic Activity: 8-22 mins                    Chesley Noon, PTA 05/04/21, 12:24 PM

## 2021-05-04 NOTE — Progress Notes (Signed)
Progress Note   Patient: Jamie Anderson APO:[id number] DOB: 1935/10/18 DOA: 04/29/2021     0 DOS: the patient was seen and examined on 05/04/2021   Brief hospital course: 86 year old female past medical history of hypertension, hyperlipidemia, bronchiectasis, CKD stage III who was admitted to orthopedic service for total knee replacement on 04/29/2021.  Hospitalist service consulted due to orthostatic hypotension and A-fib RVR.  Patient was subsequently found to have a right lower extremity DVT involving the popliteal and calf veins and started on Eliquis.  CTA chest was negative for PE and patient is free of dyspnea or pleuritic chest pain.  2/27: Repeat orthostatic vitals today were normal.  A-fib appears paroxysmal, she had an isolated episode she spontaneously converted to normal sinus rhythm.  Medically stable for discharge to SNF/rehab per Ortho and TOC.     Continue PT and OT while here. Continue telemetry monitoring while here. Recheck orthostatic vitals if symptoms recur.  Assessment and Plan: Paroxysmal A-fib [facility]) Patient had a brief episode of A-fib with RVR on 2/26.  She spontaneously converted to normal sinus rhythm.  She has been started on Eliquis for right lower extremity DVT.  Also already on Toprol. 2/27: Remains in sinus rhythm  -- Continue Eliquis and metoprolol -- Outpatient cardiology follow-up  Acute deep vein thrombosis (DVT) of calf muscle vein of right lower extremity (HCC) Started on Eliquis loading dose 10 mg twice daily x7 days then 5 mg twice daily.  CTA chest negative for PE and patient has free of dyspnea or pleuritic chest pain.  S/P TKR (total knee replacement) Surgery was on 04/29/2021.  Management per orthopedic surgery.  Continue PT and OT.  Pain control as needed.  Has right lower extremity DVT and started on Eliquis  Orthostatic hypotension On 2/26 working with PT, patient felt dizzy when upright.  Orthostatic vitals were positive with drop in  blood pressure from 139/64 down to 98/51 sitting with heart rate in the 120s.  Patient was reportedly having diarrhea that day as well and found to be in A-fib with RVR. -- ARB is on hold -- Repeat orthostatics this morning were negative -- Resting BPs are elevated today, resume lower dose ARB and monitor -- Orthostatics every shift  Primary hypertension- (present on admission) Patient had symptomatic orthostatic hypotension on 2/26.  ARB was placed on hold and Toprol continued.  BPs today are elevated.  Resuming ARB at lowest dose (irbesartan as formulary substitute for home telmisartan).  Monitor blood pressures and adjust as needed if orthostatic symptoms recur. -- Advise close PCP follow-up after discharge -- Recommend close ambulatory BP monitoring after discharge        Subjective: Patient was taking seated rest break working with PT when seen today.  She denies chest pain or shortness of breath.  Denies calf pain or pain behind her right knee.  Currently right knee pain is controlled.  No other acute complaints.  Eager to go to rehab.  Physical Exam: Vitals:   05/04/21 0837 05/04/21 0851 05/04/21 0853 05/04/21 1216  BP: (!) 186/83 (!) 160/66 (!) 151/49   Pulse: 84 85 81   Resp:      Temp:      TempSrc:      SpO2:    98%  Weight:      Height:       General exam: awake, alert, no acute distress HEENT: atraumatic, clear conjunctiva, anicteric sclera, moist mucus membranes, hearing grossly normal  Respiratory system: CTAB, no wheezes,  rales or rhonchi, normal respiratory effort. Cardiovascular system: normal S1/S2,  RRR, no JVD, murmurs, rubs, gallops, trace right lower extremity edema, no left lower extremity edema.   Gastrointestinal system: soft, NT, ND, no HSM felt, +bowel sounds. Central nervous system: A&O x4. no gross focal neurologic deficits, normal speech Extremities: Trace lower extremity edema on the right no right calf pain Skin: dry, intact, normal  temperature Psychiatry: normal mood, congruent affect, judgement and insight appear normal   Data Reviewed:  Labs reviewed and notable for metabolic panel showing glucose 60 which is likely inaccurate due to equipment malfunction in the lab this morning (CBGs today 110 and 114).  Otherwise BUN 28, albumin 3.0, troponin down trended from 87-81 and patient has no chest pain.  Hemoglobin 9.6.  Normal TSH 0.947  Family Communication: Son (?)  At bedside on rounds today   Disposition: Status is: Inpatient Remains inpatient appropriate because: Awaiting SNF placement for rehab          Planned Discharge Destination: Skilled nursing facility     Time spent: 55 minutes including time at bedside and in coordinating care including peer to peer call with insurance  Author: Ezekiel Slocumb, DO 05/04/2021 3:55 PM  For on call review [email or web address].

## 2021-05-04 NOTE — Assessment & Plan Note (Signed)
Patient had a brief episode of A-fib with RVR on 2/26.  She spontaneously converted to normal sinus rhythm.  She has been started on Eliquis for right lower extremity DVT.  Also already on Toprol. 2/27: Remains in sinus rhythm  -- Continue Eliquis and metoprolol -- Outpatient cardiology follow-up

## 2021-05-04 NOTE — Progress Notes (Signed)
Occupational Therapy Treatment Patient Details Name: Jamie Anderson MRN: [id number] DOB: 10-23-1935 Today's Date: 05/04/2021   History of present illness Pt is an 86 y.o. female s/p R TKA 04/29/21 secondary to unilateral primary OA R knee.  PMH includes CKD, atypical chest pain, OCD, gout, SCC, htn, asthma, and spine surgery.   OT comments  Pt seen for OT and cotx with PT. Pt in recliner, states "it's fine" when asked about R knee pain, and agreeable to toileting. Pt unable to tolerate getting to the bathroom, instead required BSC be brought to ~5' away from recliner. Pt completed ADL transfers with CGA +RW +2 for safety. Pt unable to maintain static standing balance in order to doff brief in standing requiring MOD A prior to sitting on the [facility]. Once pt stood again, she was able to complete pericare and donned briefs in standing with no UE vs UE vs BUE support on RW. Pt provided with cue for how to adjust technique based on stability for LB dressing/clothing mgt and pericare in standing. Pt set up with grooming tasks in bed at end of session. Pt has not made the progress expected, now with new medical concerns contributing to her overall falls risk and risk for functional decline. Pt requires skilled assist for ADL transfers and mobility in addition to LB dressing, bathing, and showering. Pt will benefit from continued skilled OT services to address goals. Recommendation updated to reflect slow progress.     Recommendations for follow up therapy are one component of a multi-disciplinary discharge planning process, led by the attending physician.  Recommendations may be updated based on patient status, additional functional criteria and insurance authorization.    Follow Up Recommendations  Skilled nursing-short term rehab (<3 hours/day)    Assistance Recommended at Discharge Frequent or constant Supervision/Assistance  Patient can return home with the following  A little help with walking and/or  transfers;A little help with bathing/dressing/bathroom;Help with stairs or ramp for entrance;Assistance with cooking/housework;Assist for transportation   Equipment Recommendations  BSC/3in1    Recommendations for Other Services      Precautions / Restrictions Precautions Precautions: Knee;Fall Precaution Comments: orthostatic Sunday 2/26 Restrictions Weight Bearing Restrictions: Yes RLE Weight Bearing: Weight bearing as tolerated       Mobility Bed Mobility Overal bed mobility: Needs Assistance Bed Mobility: Sit to Supine           General bed mobility comments: assist from PT    Transfers Overall transfer level: Needs assistance Equipment used: Rolling walker (2 wheels) Transfers: Sit to/from Stand Sit to Stand: Min guard, +2 safety/equipment           General transfer comment: VC for hand placement, heavy reliance on RW; Pt unable to make it 15' to the bathroom so BSC was brought to her. ~5' with RW and CGA +2 for safety to [facility]     Balance Overall balance assessment: Needs assistance Sitting-balance support: No upper extremity supported, Feet supported Sitting balance-Leahy Scale: Good     Standing balance support: During functional activity, Bilateral upper extremity supported, No upper extremity supported, Single extremity supported Standing balance-Leahy Scale: Fair Standing balance comment: static standing with no UE vs UE vs BUE during pericare and clothing mgt after toileting                           ADL either performed or assessed with clinical judgement   ADL Overall ADL's : Needs assistance/impaired  General ADL Comments: Pt required CGA+2 for transfer from recliner and to [facility] with VC for hand placement. In standing with intermittent UE versus BUE versus no UE support on RW pt performed pericare after set up. Required MOD A for clothing mgt before toileting and MIN A after  toileting. Set up in bed with grooming tasks    Extremity/Trunk Assessment Upper Extremity Assessment Upper Extremity Assessment: Overall WFL for tasks assessed   Lower Extremity Assessment Lower Extremity Assessment: Generalized weakness   Cervical / Trunk Assessment Cervical / Trunk Assessment: Other exceptions Cervical / Trunk Exceptions: forward head/shoulders    Vision       Perception     Praxis      Cognition Arousal/Alertness: Awake/alert Behavior During Therapy: WFL for tasks assessed/performed Overall Cognitive Status: Within Functional Limits for tasks assessed                                          Exercises      Shoulder Instructions       General Comments      Pertinent Vitals/ Pain       Pain Assessment Pain Assessment: Faces Faces Pain Scale: Hurts little more Pain Location: R knee (pt states "it's fine" but does visually demonstrate visual cues for pain) Pain Descriptors / Indicators: Grimacing, Guarding Pain Intervention(s): Limited activity within patient's tolerance, Monitored during session, Repositioned, Ice applied  Home Living                                          Prior Functioning/Environment              Frequency  Min 2X/week        Progress Toward Goals  OT Goals(current goals can now be found in the care plan section)  Progress towards OT goals: Progressing toward goals;OT to reassess next treatment  Acute Rehab OT Goals Patient Stated Goal: to get better OT Goal Formulation: With patient/family Time For Goal Achievement: 05/15/21 Potential to Achieve Goals: Good  Plan Frequency remains appropriate;Discharge plan needs to be updated    Co-evaluation    PT/OT/SLP Co-Evaluation/Treatment: Yes Reason for Co-Treatment: For patient/therapist safety;To address functional/ADL transfers PT goals addressed during session: Mobility/safety with mobility;Balance;Proper use of DME OT  goals addressed during session: Proper use of Adaptive equipment and DME;ADL's and self-care      AM-PAC OT "6 Clicks" Daily Activity     Outcome Measure   Help from another person eating meals?: None Help from another person taking care of personal grooming?: A Little Help from another person toileting, which includes using toliet, bedpan, or urinal?: A Little Help from another person bathing (including washing, rinsing, drying)?: A Little Help from another person to put on and taking off regular upper body clothing?: None Help from another person to put on and taking off regular lower body clothing?: A Lot 6 Click Score: 19    End of Session Equipment Utilized During Treatment: Rolling walker (2 wheels)  OT Visit Diagnosis: Other abnormalities of gait and mobility (R26.89);Muscle weakness (generalized) (M62.81)   Activity Tolerance Patient tolerated treatment well   Patient Left in bed;with call bell/phone within reach;with bed alarm set   Nurse Communication          Time: [phone] OT  Time Calculation (min): 23 min  Charges: OT General Charges $OT Visit: 1 Visit OT Treatments $Self Care/Home Management : 8-22 mins  Ardeth Perfect., MPH, MS, OTR/L ascom [phone] 05/04/21, 2:49 PM

## 2021-05-05 ENCOUNTER — Other Ambulatory Visit (HOSPITAL_COMMUNITY): Payer: Self-pay

## 2021-05-05 DIAGNOSIS — N1832 Chronic kidney disease, stage 3b: Secondary | ICD-10-CM | POA: Diagnosis not present

## 2021-05-05 DIAGNOSIS — I82401 Acute embolism and thrombosis of unspecified deep veins of right lower extremity: Secondary | ICD-10-CM | POA: Diagnosis not present

## 2021-05-05 DIAGNOSIS — I10 Essential (primary) hypertension: Secondary | ICD-10-CM | POA: Diagnosis not present

## 2021-05-05 DIAGNOSIS — J479 Bronchiectasis, uncomplicated: Secondary | ICD-10-CM | POA: Diagnosis not present

## 2021-05-05 DIAGNOSIS — M6281 Muscle weakness (generalized): Secondary | ICD-10-CM | POA: Diagnosis not present

## 2021-05-05 DIAGNOSIS — I480 Paroxysmal atrial fibrillation: Secondary | ICD-10-CM | POA: Diagnosis not present

## 2021-05-05 DIAGNOSIS — Z471 Aftercare following joint replacement surgery: Secondary | ICD-10-CM | POA: Diagnosis not present

## 2021-05-05 DIAGNOSIS — I82461 Acute embolism and thrombosis of right calf muscular vein: Secondary | ICD-10-CM | POA: Diagnosis not present

## 2021-05-05 DIAGNOSIS — Z741 Need for assistance with personal care: Secondary | ICD-10-CM | POA: Diagnosis not present

## 2021-05-05 DIAGNOSIS — Z96651 Presence of right artificial knee joint: Secondary | ICD-10-CM | POA: Diagnosis not present

## 2021-05-05 DIAGNOSIS — K219 Gastro-esophageal reflux disease without esophagitis: Secondary | ICD-10-CM | POA: Diagnosis not present

## 2021-05-05 DIAGNOSIS — M1711 Unilateral primary osteoarthritis, right knee: Secondary | ICD-10-CM | POA: Diagnosis not present

## 2021-05-05 DIAGNOSIS — Z7401 Bed confinement status: Secondary | ICD-10-CM | POA: Diagnosis not present

## 2021-05-05 DIAGNOSIS — I82491 Acute embolism and thrombosis of other specified deep vein of right lower extremity: Secondary | ICD-10-CM | POA: Diagnosis not present

## 2021-05-05 DIAGNOSIS — R278 Other lack of coordination: Secondary | ICD-10-CM | POA: Diagnosis not present

## 2021-05-05 DIAGNOSIS — M109 Gout, unspecified: Secondary | ICD-10-CM | POA: Diagnosis not present

## 2021-05-05 DIAGNOSIS — F39 Unspecified mood [affective] disorder: Secondary | ICD-10-CM | POA: Diagnosis not present

## 2021-05-05 DIAGNOSIS — R6889 Other general symptoms and signs: Secondary | ICD-10-CM | POA: Diagnosis not present

## 2021-05-05 MED ORDER — APIXABAN 5 MG PO TABS: 5.0000 mg | ORAL_TABLET | Freq: Two times a day (BID) | ORAL | 0 refills | Status: DC

## 2021-05-05 MED ORDER — APIXABAN 5 MG PO TABS: 10.0000 mg | ORAL_TABLET | Freq: Two times a day (BID) | ORAL | 0 refills | Status: DC

## 2021-05-05 MED ORDER — IRBESARTAN 150 MG PO TABS
150.0000 mg | ORAL_TABLET | Freq: Every day | ORAL | Status: DC
  Administered 2021-05-05: 150 mg via ORAL
  Filled 2021-05-05: qty 1

## 2021-05-05 NOTE — Discharge Summary (Signed)
Physician Discharge Summary  Patient ID: Jamie Anderson MRN: [id number] DOB/AGE: April 04, 1935 86 y.o.  Admit date: 04/29/2021 Discharge date: 05/05/2021  Admission Diagnoses:  M17.11 Unilateral primary osteoarthritis, right knee S/P TKR (total knee replacement) using cement, right  Discharge Diagnoses:  M17.11 Unilateral primary osteoarthritis, right knee Principal Problem:   S/P TKR (total knee replacement) using cement, right Active Problems:   Primary hypertension   Orthostatic hypotension   S/P TKR (total knee replacement)   Acute deep vein thrombosis (DVT) of calf muscle vein of right lower extremity (HCC)   Paroxysmal A-fib (HCC)   Past Medical History:  Diagnosis Date   Arthritis    Asthma    Basal cell carcinoma 01/17/2018   Left chest. Infiltrative. Excised 02/06/2018, margins free.   Cystic teratoma of right ovary 11/23/2012   She underwent right salpingo-oophorectomy on April 1 by Dr. Kenton Kingfisher for removal of what turned out to be a cystic teratoma. Hysteroscopy was also done at the time due to postmenopausal bleeding. It is unclear  why the left ovary was not taken out since she is postmenopausal.    Dyspnea    GERD (gastroesophageal reflux disease)    Granuloma annulare    Hyperlipidemia    Hypertension    Menopause    Plantar fasciitis of right foot 12/24/2014   SCC (squamous cell carcinoma) 10/08/2020   R mid lower pretibia, EDC   SCC (squamous cell carcinoma) 03/16/2021   R upper pretibial, needs EDC pt sched 06/02/21   Squamous cell carcinoma of skin 02/26/2020   R lower pretibial, clear with bx 04/01/20   Squamous cell carcinoma of skin 07/15/2020   R mid pretibia, EDC   Squamous cell carcinoma of skin 12/09/2020   R lateral pretibia, EDC   Stage 3b chronic kidney disease (CKD) ([place])     Surgeries: Procedure(s): TOTAL KNEE ARTHROPLASTY on 04/29/2021   Consultants (if any): Treatment Team:  Barth Kirks Team 7, MD Lovell Sheehan, MD Ezekiel Slocumb, DO  Discharged Condition: Improved  Hospital Course: KATHREN SCEARCE is an 86 y.o. female who was admitted 04/29/2021 with a diagnosis of  M17.11 Unilateral primary osteoarthritis, right knee S/P TKR (total knee replacement) using cement, right and went to the operating room on 04/29/2021 and underwent the above named procedures.    She was given perioperative antibiotics:  Anti-infectives (From admission, onward)    Start     Dose/Rate Route Frequency Ordered Stop   04/30/21 0600  vancomycin (VANCOCIN) IVPB 1000 mg/200 mL premix        1,000 mg 200 mL/hr over 60 Minutes Intravenous On call to O.R. 04/29/21 1310 04/29/21 1501   04/29/21 2000  ceFAZolin (ANCEF) IVPB 2g/100 mL premix        2 g 200 mL/hr over 30 Minutes Intravenous Every 6 hours 04/29/21 1735 04/30/21 0200   04/29/21 1328  vancomycin (VANCOCIN) 1-5 GM/200ML-% IVPB       Note to Pharmacy: Trudie Reed S: cabinet override      04/29/21 1328 04/29/21 1439     .  She was given sequential compression devices, early ambulation, and Eliquis as directed for DVT.  She benefited maximally from the hospital stay and there were no complications.    Recent vital signs:  Vitals:   05/05/21 0112 05/05/21 0316  BP: (!) 151/59 (!) 166/71  Pulse: 93 95  Resp: 18 18  Temp: 98.6 F (37 C) 98.5 F (36.9 C)  SpO2: 97% 96%  Recent laboratory studies:  Lab Results  Component Value Date   HGB 9.6 (L) 05/04/2021   HGB 10.8 (L) 05/03/2021   HGB 10.0 (L) 05/01/2021   Lab Results  Component Value Date   WBC 7.6 05/04/2021   PLT 201 05/04/2021   Lab Results  Component Value Date   INR 0.9 05/29/2012   Lab Results  Component Value Date   NA 136 05/04/2021   K 3.9 05/04/2021   CL 99 05/04/2021   CO2 24 05/04/2021   BUN 28 (H) 05/04/2021   CREATININE 0.91 05/04/2021   GLUCOSE 102 (H) 05/04/2021    Discharge Medications:   Allergies as of 05/05/2021       Reactions   Clarithromycin    Unknown   Colloidal Oatmeal     Unknown reaction   Dust Mite Extract    Oat    Unknown reaction   Other    Almonds - unknown reaction Mold Spores   Shrimp [shellfish Allergy]    Unknown reaction         Medication List     STOP taking these medications    aspirin EC 81 MG tablet   CO Q 10 PO   mupirocin ointment 2 % Commonly known as: BACTROBAN   MUSCLE RUB EX   traMADol 50 MG tablet Commonly known as: ULTRAM   trolamine salicylate 10 % cream Commonly known as: ASPERCREME   ZIMS MAX-FREEZE EX       TAKE these medications    acetaminophen 325 MG tablet Commonly known as: TYLENOL Take 1-2 tablets (325-650 mg total) by mouth every 6 (six) hours as needed for mild pain (pain score 1-3 or temp > 100.5). What changed:  medication strength how much to take reasons to take this   albuterol 108 (90 Base) MCG/ACT inhaler Commonly known as: VENTOLIN HFA Inhale 1 puff into the lungs every 6 (six) hours as needed for wheezing or shortness of breath.   allopurinol 100 MG tablet Commonly known as: ZYLOPRIM TAKE 1 TABLET BY MOUTH  DAILY   apixaban 5 MG Tabs tablet Commonly known as: ELIQUIS Take 2 tablets (10 mg total) by mouth 2 (two) times daily.   apixaban 5 MG Tabs tablet Commonly known as: ELIQUIS Take 1 tablet (5 mg total) by mouth 2 (two) times daily. Start taking on: May 10, 2021   calcium carbonate 500 MG chewable tablet Commonly known as: TUMS - dosed in mg elemental calcium Chew 1 tablet by mouth daily as needed for indigestion or heartburn.   cetirizine 10 MG tablet Commonly known as: ZYRTEC Take 10 mg by mouth daily as needed for allergies.   clobetasol cream 0.05 % Commonly known as: TEMOVATE Apply to itchy rash on back 1-2 times a day until improved. Avoid face, groin, axilla. What changed:  how much to take how to take this when to take this reasons to take this additional instructions   docusate sodium 100 MG capsule Commonly known as: COLACE Take 1 capsule  (100 mg total) by mouth 2 (two) times daily.   ezetimibe 10 MG tablet Commonly known as: ZETIA TAKE 1 TABLET BY MOUTH  DAILY   fluocinonide cream 0.05 % Commonly known as: LIDEX Apply 1 application topically 2 (two) times daily as needed (itching).   fluticasone 50 MCG/ACT nasal spray Commonly known as: FLONASE Place 2 sprays into both nostrils daily. What changed:  when to take this reasons to take this   fluticasone-salmeterol 100-50 MCG/ACT Aepb Commonly  known as: ADVAIR Inhale 1 puff into the lungs 2 (two) times daily. Rinse mouth after each use. What changed:  when to take this reasons to take this   HYDROcodone-acetaminophen 5-325 MG tablet Commonly known as: NORCO/VICODIN Take 1-2 tablets by mouth every 4 (four) hours as needed for moderate pain (pain score 4-6).   methocarbamol 500 MG tablet Commonly known as: ROBAXIN Take 1 tablet (500 mg total) by mouth every 6 (six) hours as needed for muscle spasms.   metoprolol succinate 25 MG 24 hr tablet Commonly known as: TOPROL-XL TAKE 1 TABLET BY MOUTH  DAILY What changed: when to take this   nystatin 100000 UNIT/ML suspension Commonly known as: MYCOSTATIN Take 5 mLs (500,000 Units total) by mouth 4 (four) times daily. What changed:  when to take this reasons to take this   pantoprazole 40 MG tablet Commonly known as: PROTONIX TAKE 1 TABLET BY MOUTH  DAILY   Premarin vaginal cream Generic drug: conjugated estrogens Place 1 Applicatorful vaginally 2 (two) times a week. What changed: See the new instructions.   sertraline 100 MG tablet Commonly known as: ZOLOFT TAKE 1 TABLET BY MOUTH  DAILY   simvastatin 40 MG tablet Commonly known as: ZOCOR Take 1 tablet (40 mg total) by mouth at bedtime.   telmisartan 80 MG tablet Commonly known as: MICARDIS Take 1 tablet (80 mg total) by mouth daily. What changed: when to take this   VITAMIN D PO Take 1 capsule by mouth daily.        Diagnostic Studies: CT  Angio Chest Pulmonary Embolism (PE) W or WO Contrast  Result Date: 05/03/2021 CLINICAL DATA:  Pulmonary embolism suspected, high probability. Postop knee surgery. EXAM: CT ANGIOGRAPHY CHEST WITH CONTRAST TECHNIQUE: Multidetector CT imaging of the chest was performed using the standard protocol during bolus administration of intravenous contrast. Multiplanar CT image reconstructions and MIPs were obtained to evaluate the vascular anatomy. RADIATION DOSE REDUCTION: This exam was performed according to the departmental dose-optimization program which includes automated exposure control, adjustment of the mA and/or kV according to patient size and/or use of iterative reconstruction technique. CONTRAST:  59mL OMNIPAQUE IOHEXOL 350 MG/ML SOLN COMPARISON:  Chest radiography 03/24/2021. Prior CT angiography 01/18/2017 FINDINGS: Cardiovascular: Heart size is normal. No pericardial effusion. Coronary artery calcification and aortic atherosclerotic calcification are present. Pulmonary arterial opacification is good. There are no pulmonary emboli. Mediastinum/Nodes: No mediastinal or hilar mass or lymphadenopathy. Lungs/Pleura: No pleural effusion. The lungs are clear. No infiltrate, mass, effusion or collapse. Mild pleural and parenchymal scarring at the lung apices. Upper Abdomen: Negative Musculoskeletal: Ordinary mild thoracic degenerative changes. Review of the MIP images confirms the above findings. IMPRESSION: No pulmonary emboli or other acute chest pathology. Coronary artery calcification. Aortic atherosclerotic calcification. Ordinary mild pleural and parenchymal scarring at the lung apices. Electronically Signed   By: Nelson Chimes M.D.   On: 05/03/2021 17:19   US Venous Img Lower Bilateral (DVT)  Result Date: 05/03/2021 CLINICAL DATA:  Right greater than left lower extremity pain the past 4 days. History of right-sided knee replacement on 04/29/2021 EXAM: BILATERAL LOWER EXTREMITY VENOUS DOPPLER ULTRASOUND  TECHNIQUE: Gray-scale sonography with graded compression, as well as color Doppler and duplex ultrasound were performed to evaluate the lower extremity deep venous systems from the level of the common femoral vein and including the common femoral, femoral, profunda femoral, popliteal and calf veins including the posterior tibial, peroneal and gastrocnemius veins when visible. The superficial great saphenous vein was also interrogated. Spectral  Doppler was utilized to evaluate flow at rest and with distal augmentation maneuvers in the common femoral, femoral and popliteal veins. COMPARISON:  None. FINDINGS: RIGHT LOWER EXTREMITY Common Femoral Vein: No evidence of thrombus. Normal compressibility, respiratory phasicity and response to augmentation. Saphenofemoral Junction: No evidence of thrombus. Normal compressibility and flow on color Doppler imaging. Profunda Femoral Vein: No evidence of thrombus. Normal compressibility and flow on color Doppler imaging. Femoral Vein: No evidence of thrombus. Normal compressibility, respiratory phasicity and response to augmentation. Popliteal Vein: The popliteal vein is not compressible. The lumen is expanded and filled with low-level echoes. No evidence of color flow on color Doppler imaging. Thrombus extends into the posterior tibial and peroneal veins in the upper calf. Calf Veins: Thrombus extends into the posterior tibial and peroneal veins in the upper calf. Superficial Great Saphenous Vein: No evidence of thrombus. Normal compressibility. Venous Reflux:  None. Other Findings:  None. LEFT LOWER EXTREMITY Common Femoral Vein: No evidence of thrombus. Normal compressibility, respiratory phasicity and response to augmentation. Saphenofemoral Junction: No evidence of thrombus. Normal compressibility and flow on color Doppler imaging. Profunda Femoral Vein: No evidence of thrombus. Normal compressibility and flow on color Doppler imaging. Femoral Vein: No evidence of thrombus.  Normal compressibility, respiratory phasicity and response to augmentation. Popliteal Vein: No evidence of thrombus. Normal compressibility, respiratory phasicity and response to augmentation. Calf Veins: No evidence of thrombus. Normal compressibility and flow on color Doppler imaging. Superficial Great Saphenous Vein: No evidence of thrombus. Normal compressibility. Venous Reflux:  None. Other Findings:  None. IMPRESSION: Positive for acute occlusive DVT within the popliteal and calf veins. These results will be called to the ordering clinician or representative by the Radiologist Assistant, and communication documented in the PACS or [employer]. Electronically Signed   By: Jacqulynn Cadet M.D.   On: 05/03/2021 15:03   DG Knee Right Port  Result Date: 04/29/2021 CLINICAL DATA:  Status post total knee arthroplasty. EXAM: PORTABLE RIGHT KNEE - 1-2 VIEW COMPARISON:  None. FINDINGS: The patient is status post total knee arthroplasty with patellar resurfacing. Gas and fluid are seen in the joint space. There is no joint dislocation. There is no periprosthetic fracture. IMPRESSION: Postoperative appearance of total knee arthroplasty. Electronically Signed   By: Zerita Boers M.D.   On: 04/29/2021 16:38   [place] OR NERVE BLOCK-IMAGE ONLY [facility])  Result Date: 04/29/2021 There is no interpretation for this exam.  This order is for images obtained during a surgical procedure.  Please See "Surgeries" Tab for more information regarding the procedure.    Disposition: Discharge disposition: 03-Skilled Nursing Facility      Patient already has follow up scheduled for staple removal on 05/12/21 in our office at 2:45pm.  Call the office for questions [phone]     Contact information for after-discharge care     Destination     [facility] .   Service: Skilled Nursing Contact information: [address] [place] [phone]                       Signed: [name] ,PA-C 05/05/2021, 10:24 AM

## 2021-05-05 NOTE — Progress Notes (Signed)
PIV removed. Discharge instructions completed. Patient verbalized understanding of medication regimen, follow up appointments and discharge instructions. Patient belongings gathered and packed to discharge. Report called to [facility].

## 2021-05-05 NOTE — Progress Notes (Signed)
Physical Therapy Treatment Patient Details Name: Jamie Anderson MRN: [id number] DOB: April 21, 1935 Today's Date: 05/05/2021   History of Present Illness Pt is an 86 y.o. female s/p R TKA 04/29/21 secondary to unilateral primary OA R knee.  PMH includes CKD, atypical chest pain, OCD, gout, SCC, htn, asthma, and spine surgery.    PT Comments    Pt received in Semi-Fowler's position and agreeable to therapy.  Pt performed with much better technique when performing transfer training, with no complications.  Pt reported no dizziness upon changes in positioning as well.  Pt able to ambulate 54' outside of the room and back to bed before reporting increased fatigue.  Pt left with all needs met and call bell within reach.  Current discharge plans to SNF remain appropriate at this time.  Pt will continue to benefit from skilled therapy in order to address deficits listed below.      Recommendations for follow up therapy are one component of a multi-disciplinary discharge planning process, led by the attending physician.  Recommendations may be updated based on patient status, additional functional criteria and insurance authorization.  Follow Up Recommendations  Skilled nursing-short term rehab (<3 hours/day)     Assistance Recommended at Discharge Intermittent Supervision/Assistance  Patient can return home with the following A little help with walking and/or transfers;A little help with bathing/dressing/bathroom;Assistance with cooking/housework;Assist for transportation;Help with stairs or ramp for entrance   Equipment Recommendations  Rolling walker (2 wheels);BSC/3in1    Recommendations for Other Services       Precautions / Restrictions Precautions Precautions: Knee;Fall Restrictions Weight Bearing Restrictions: Yes RLE Weight Bearing: Weight bearing as tolerated     Mobility  Bed Mobility Overal bed mobility: Needs Assistance Bed Mobility: Sit to Supine       Sit to supine:  Supervision        Transfers Overall transfer level: Needs assistance Equipment used: Rolling walker (2 wheels) Transfers: Sit to/from Stand Sit to Stand: Min guard                Ambulation/Gait Ambulation/Gait assistance: Min guard Gait Distance (Feet): 65 Feet Assistive device: Rolling walker (2 wheels) Gait Pattern/deviations: Step-to pattern Gait velocity: decreased     General Gait Details: Pt with much better pain control and able to ambulate 30 feet prior to becoming fatigue and turning around and walking back to room.   Stairs             Wheelchair Mobility    Modified Rankin (Stroke Patients Only)       Balance Overall balance assessment: Needs assistance Sitting-balance support: No upper extremity supported, Feet supported Sitting balance-Leahy Scale: Good     Standing balance support: During functional activity, Bilateral upper extremity supported, No upper extremity supported, Single extremity supported Standing balance-Leahy Scale: Fair                              Cognition Arousal/Alertness: Awake/alert Behavior During Therapy: WFL for tasks assessed/performed Overall Cognitive Status: Within Functional Limits for tasks assessed                                          Exercises      General Comments        Pertinent Vitals/Pain Pain Assessment Pain Assessment: Faces Faces Pain Scale: Hurts little more Pain  Location: Pt demonstrating decreased painful response when ambulating.    Home Living                          Prior Function            PT Goals (current goals can now be found in the care plan section)      Frequency    BID      PT Plan Current plan remains appropriate    Co-evaluation              AM-PAC PT "6 Clicks" Mobility   Outcome Measure  Help needed turning from your back to your side while in a flat bed without using bedrails?: None Help  needed moving from lying on your back to sitting on the side of a flat bed without using bedrails?: A Little Help needed moving to and from a bed to a chair (including a wheelchair)?: A Little Help needed standing up from a chair using your arms (e.g., wheelchair or bedside chair)?: A Little Help needed to walk in hospital room?: A Little Help needed climbing 3-5 steps with a railing? : A Little 6 Click Score: 19    End of Session Equipment Utilized During Treatment: Gait belt Activity Tolerance: Patient tolerated treatment well Patient left: in chair;with call bell/phone within reach;with family/visitor present;Other (comment) Nurse Communication: Mobility status;Other (comment) PT Visit Diagnosis: Other abnormalities of gait and mobility (R26.89);Muscle weakness (generalized) (M62.81);Difficulty in walking, not elsewhere classified (R26.2);Pain Pain - Right/Left: Right Pain - part of body: Knee     Time: 1012-1036 PT Time Calculation (min) (ACUTE ONLY): 24 min  Charges:  $Gait Training: 23-37 mins                     Gwenlyn Saran, PT, DPT 05/05/21, 12:16 PM    Jamie Anderson 05/05/2021, 12:14 PM

## 2021-05-05 NOTE — TOC Benefit Eligibility Note (Signed)
Patient Advocate Encounter  Insurance verification completed.    The patient is currently admitted and upon discharge could be taking Eliquis 5 mg.  The current 30 day co-pay is, $47.00.   The patient is insured through [employer] Medicare Part D    Jaideep Pollack, CPhT Pharmacy Patient Advocate Specialist [facility] Patient Advocate Team Direct Number: [phone]  Fax: [phone]        

## 2021-05-05 NOTE — TOC Transition Note (Signed)
[facility] - CM/SW Discharge Note   Patient Details  Name: Jamie Anderson MRN: [id number] Date of Birth: Feb 02, 1936  Transition of Care [facility]) CM/SW Contact:  Alberteen Sam, LCSW Phone Number: 05/05/2021, 10:38 AM   Clinical Narrative:     Patient will DC to: [facility] Anticipated DC date: 05/05/21 Family notified: daughter Olin Hauser Transport byJohnanna Schneiders  Per MD patient ready for DC to [facility] . RN, patient, patient's family, and facility notified of DC. Discharge Summary sent to facility. RN given number for report  [phone] Room 116. DC packet on chart. Ambulance transport requested for patient.  CSW signing off.  Pricilla Riffle, LCSW    Final next level of care: Skilled Nursing Facility Barriers to Discharge: No Barriers Identified   Patient Goals and CMS Choice Patient states their goals for this hospitalization and ongoing recovery are:: to go home CMS Medicare.gov Compare Post Acute Care list provided to:: Patient Represenative (must comment) (daughter Olin Hauser) Choice offered to / list presented to : Adult Children  Discharge Placement              Patient chooses bed at: [facility] Patient to be transferred to facility by: [facility] Name of family member notified: daughter Olin Hauser Patient and family notified of of transfer: 05/05/21  Discharge Plan and Services     Post Acute Care Choice: [place]                               Social Determinants of Health (SDOH) Interventions     Readmission Risk Interventions No flowsheet data found.

## 2021-05-13 NOTE — Progress Notes (Deleted)
?Cardiology Office Note:   ? ?Date:  05/13/2021  ? ?ID:  Jamie Anderson, DOB 22-Jul-1935, MRN [id number] ? ?PCP:  Crecencio Mc, MD  ?[facility] Cardiologist:  Ida Rogue, MD  ?[facility] Electrophysiologist:  None  ? ?Referring MD: Crecencio Mc, MD  ? ?Chief Complaint: Hospital follow-up ? ?History of Present Illness:   ? ?Jamie Anderson is a 86 y.o. female with a hx of HTN, HLD, chronic back pain, coronary calcification and aortic atherosclerosis ? ?Past Medical History:  ?Diagnosis Date  ? Arthritis   ? Asthma   ? Basal cell carcinoma 01/17/2018  ? Left chest. Infiltrative. Excised 02/06/2018, margins free.  ? Cystic teratoma of right ovary 11/23/2012  ? She underwent right salpingo-oophorectomy on April 1 by Dr. Kenton Kingfisher for removal of what turned out to be a cystic teratoma. Hysteroscopy was also done at the time due to postmenopausal bleeding. It is unclear  why the left ovary was not taken out since she is postmenopausal.   ? Dyspnea   ? GERD (gastroesophageal reflux disease)   ? Granuloma annulare   ? Hyperlipidemia   ? Hypertension   ? Menopause   ? Plantar fasciitis of right foot 12/24/2014  ? SCC (squamous cell carcinoma) 10/08/2020  ? R mid lower pretibia, EDC  ? SCC (squamous cell carcinoma) 03/16/2021  ? R upper pretibial, needs EDC pt sched 06/02/21  ? Squamous cell carcinoma of skin 02/26/2020  ? R lower pretibial, clear with bx 04/01/20  ? Squamous cell carcinoma of skin 07/15/2020  ? R mid pretibia, EDC  ? Squamous cell carcinoma of skin 12/09/2020  ? R lateral pretibia, EDC  ? Stage 3b chronic kidney disease (CKD) ([place])   ? ? ?Past Surgical History:  ?Procedure Laterality Date  ? APPENDECTOMY    ? CATARACT EXTRACTION, BILATERAL    ? DILATION AND CURETTAGE OF UTERUS    ? RIGHT OOPHORECTOMY Right 04/08/2012  ? [name]  ? SPINE SURGERY  03/08/2008  ? TOTAL KNEE ARTHROPLASTY Right 04/29/2021  ? Procedure: TOTAL KNEE ARTHROPLASTY;  Surgeon: Lovell Sheehan, MD;  Location: [facility] ORS;  Service:  Orthopedics;  Laterality: Right;  ? VENOUS ABLATION    ? bilateral  ? ? ?Current Medications: ?No outpatient medications have been marked as taking for the 05/15/21 encounter (Appointment) with Kathlen Mody, Alpheus Stiff H, PA-C.  ?  ? ?Allergies:   Clarithromycin, Colloidal oatmeal, Dust mite extract, Oat, Other, and Shrimp [shellfish allergy]  ? ?Social History  ? ?Socioeconomic History  ? Marital status: Widowed  ?  Spouse name: Not on file  ? Number of children: Not on file  ? Years of education: Not on file  ? Highest education level: Not on file  ?Occupational History  ? Not on file  ?Tobacco Use  ? Smoking status: Never  ? Smokeless tobacco: Never  ?Vaping Use  ? Vaping Use: Never used  ?Substance and Sexual Activity  ? Alcohol use: No  ?  Alcohol/week: 4.0 standard drinks  ?  Types: 4 Glasses of wine per week  ? Drug use: No  ? Sexual activity: Not Currently  ?Other Topics Concern  ? Not on file  ?Social History Narrative  ? Not on file  ? ?Social Determinants of Health  ? ?Financial Resource Strain: Low Risk   ? Difficulty of Paying Living Expenses: Not hard at all  ?Food Insecurity: No Food Insecurity  ? Worried About Charity fundraiser in the Last Year: Never true  ?  Ran Out of Food in the Last Year: Never true  ?Transportation Needs: No Transportation Needs  ? Lack of Transportation (Medical): No  ? Lack of Transportation (Non-Medical): No  ?Physical Activity: Unknown  ? Days of Exercise per Week: 0 days  ? Minutes of Exercise per Session: Not on file  ?Stress: No Stress Concern Present  ? Feeling of Stress : Not at all  ?Social Connections: Unknown  ? Frequency of Communication with Friends and Family: More than three times a week  ? Frequency of Social Gatherings with Friends and Family: More than three times a week  ? Attends Religious Services: Not on file  ? Active Member of Clubs or Organizations: Not on file  ? Attends Archivist Meetings: Not on file  ? Marital Status: Not on file  ?  ? ?Family  History: ?The patient's ***family history includes Breast cancer (age of onset: 38) in her sister; Cancer in her brother; Early death in her mother; Heart attack in her father; Heart disease in her brother, father, and sister. ? ?ROS:   ?Please see the history of present illness.    ?*** All other systems reviewed and are negative. ? ?EKGs/Labs/Other Studies Reviewed:   ? ?The following studies were reviewed today: ?*** ? ?EKG:  EKG is *** ordered today.  The ekg ordered today demonstrates *** ? ?Recent Labs: ?05/03/2021: B Natriuretic Peptide 223.0; TSH 0.947 ?05/04/2021: ALT 18; BUN 28; Creatinine, Ser 0.91; Hemoglobin 9.6; Platelets 201; Potassium 3.9; Sodium 136  ?Recent Lipid Panel ?   ?Component Value Date/Time  ? CHOL 166 07/29/2020 1522  ? CHOL 188 05/26/2011 0958  ? TRIG 122.0 07/29/2020 1522  ? HDL 72.80 07/29/2020 1522  ? HDL 89 05/26/2011 0958  ? CHOLHDL 2 07/29/2020 1522  ? VLDL 24.4 07/29/2020 1522  ? [place] 69 07/29/2020 1522  ? [place] 83 05/26/2011 0958  ? LDLDIRECT 59.0 01/30/2018 1602  ? ? ? ?Risk Assessment/Calculations:   ?{Does this patient have ATRIAL FIBRILLATION?:[phone]} ? ? ?Physical Exam:   ? ?VS:  There were no vitals taken for this visit.   ? ?Wt Readings from Last 3 Encounters:  ?04/29/21 188 lb 15 oz (85.7 kg)  ?04/16/21 189 lb (85.7 kg)  ?03/24/21 187 lb 6.4 oz (85 kg)  ?  ? ?GEN: *** Well nourished, well developed in no acute distress ?HEENT: Normal ?NECK: No JVD; No carotid bruits ?LYMPHATICS: No lymphadenopathy ?CARDIAC: ***RRR, no murmurs, rubs, gallops ?RESPIRATORY:  Clear to auscultation without rales, wheezing or rhonchi  ?ABDOMEN: Soft, non-tender, non-distended ?MUSCULOSKELETAL:  No edema; No deformity  ?SKIN: Warm and dry ?NEUROLOGIC:  Alert and oriented x 3 ?PSYCHIATRIC:  Normal affect  ? ?ASSESSMENT:   ? ?No diagnosis found. ?PLAN:   ? ?In order of problems listed above: ? ?*** ? ?Disposition: Follow up {follow up:15908} with ***  ? ?Shared Decision Making/Informed  Consent   ?{Are you ordering a CV Procedure (e.g. stress test, cath, DCCV, TEE, etc)?   Press F2        :[id number]}  ? ? ?Signed, ?Nelta Caudill Ninfa Meeker, PA-C  ?05/13/2021 1:22 PM    ?[place]  ?

## 2021-05-15 ENCOUNTER — Ambulatory Visit: Payer: Medicare Other | Admitting: Medical

## 2021-05-25 DIAGNOSIS — M1711 Unilateral primary osteoarthritis, right knee: Secondary | ICD-10-CM | POA: Diagnosis not present

## 2021-05-25 DIAGNOSIS — I480 Paroxysmal atrial fibrillation: Secondary | ICD-10-CM | POA: Diagnosis not present

## 2021-05-25 DIAGNOSIS — I82491 Acute embolism and thrombosis of other specified deep vein of right lower extremity: Secondary | ICD-10-CM | POA: Diagnosis not present

## 2021-05-25 DIAGNOSIS — I10 Essential (primary) hypertension: Secondary | ICD-10-CM | POA: Diagnosis not present

## 2021-05-26 ENCOUNTER — Other Ambulatory Visit: Payer: Self-pay

## 2021-05-26 ENCOUNTER — Encounter: Payer: Self-pay | Admitting: Cardiovascular Disease

## 2021-05-26 ENCOUNTER — Ambulatory Visit: Payer: Medicare Other | Admitting: Cardiovascular Disease

## 2021-05-26 VITALS — BP 140/64 | HR 91 | Ht 64.5 in | Wt 179.0 lb

## 2021-05-26 DIAGNOSIS — I700 Atherosclerosis of aorta: Secondary | ICD-10-CM

## 2021-05-26 DIAGNOSIS — E785 Hyperlipidemia, unspecified: Secondary | ICD-10-CM

## 2021-05-26 DIAGNOSIS — I10 Essential (primary) hypertension: Secondary | ICD-10-CM

## 2021-05-26 MED ORDER — APIXABAN 5 MG PO TABS: 5.0000 mg | ORAL_TABLET | Freq: Two times a day (BID) | ORAL | 4 refills | Status: DC

## 2021-05-26 MED ORDER — LOSARTAN POTASSIUM 100 MG PO TABS: 100.0000 mg | ORAL_TABLET | Freq: Every day | ORAL | 3 refills | Status: DC

## 2021-05-26 NOTE — Patient Instructions (Signed)
Medication Instructions:  No changes  If you need a refill on your cardiac medications before your next appointment, please call your pharmacy.    Lab work: No new labs needed   Testing/Procedures: No new testing needed   Follow-Up: At [facility], you and your health needs are our priority.  As part of our continuing mission to provide you with exceptional heart care, we have created designated Provider Care Teams.  These Care Teams include your primary Cardiologist (physician) and Advanced Practice Providers (APPs -  Physician Assistants and Nurse Practitioners) who all work together to provide you with the care you need, when you need it.  You will need a follow up appointment in 6 months  Providers on your designated Care Team:   Christopher Berge, NP Ryan Dunn, PA-C Cadence Furth, PA-C  COVID-19 Vaccine Information can be found at: [email or web address] For questions related to vaccine distribution or appointments, please email [email or web address] or call [phone].   

## 2021-05-26 NOTE — Progress Notes (Signed)
Cardiology Office Note ? ?Date:  05/27/2021  ? ?ID:  Jamie Anderson, DOB 1935-12-29, MRN [id number] ? ?PCP:  Crecencio Mc, MD  ? ?Chief Complaint  ?Patient presents with  ? Follow up [facility]; A-Fib   ?  "Doing well." Medications reviewed by the patient's medication list from [facility].   ? ? ?HPI:  ?[name] is a pleasant 86 year old woman with past medical history of ?Hypertension ?Hyperlipidemia ?Chronic back pain, hx of back surgery ?Aortic atherosclerosis, coronary calcification on CT ?Who presents for follow-up of her chest pain  ? ?LOV October 2022 ? ?[name] today with her daughter ?Knee surgery 04/29/21 ?Bruising, swelling ?Right DVT, dated 05/03/21 ?Started on eliquis ?CT chest no PE ? ?Reports that she went home from nursing facility yesterday ?Continues to have right knee pain at surgical site, mild lower extremity edema on that side ?Working with PT ? ?Prior history of chronic back pain, has had cortisone shots in the past ? ?Lab work reviewed ?Total chol 163, LDL 68 ?On statin with Zetia  ? ?nonsmoker ?nondiabetetic ? ?EKG personally reviewed by myself on todays visit ?Shows normal sinus rhythm rate 91 bpm no significant ST or T wave changes ? ? ?PMH:   has a past medical history of Arthritis, Asthma, Basal cell carcinoma (01/17/2018), Cystic teratoma of right ovary (11/23/2012), Dyspnea, GERD (gastroesophageal reflux disease), Granuloma annulare, Hyperlipidemia, Hypertension, Menopause, Plantar fasciitis of right foot (12/24/2014), SCC (squamous cell carcinoma) (10/08/2020), SCC (squamous cell carcinoma) (03/16/2021), Squamous cell carcinoma of skin (02/26/2020), Squamous cell carcinoma of skin (07/15/2020), Squamous cell carcinoma of skin (12/09/2020), and Stage 3b chronic kidney disease (CKD) ([place]). ? ?PSH:    ?Past Surgical History:  ?Procedure Laterality Date  ? APPENDECTOMY    ? CATARACT EXTRACTION, BILATERAL    ? DILATION AND CURETTAGE OF UTERUS    ? RIGHT OOPHORECTOMY Right 04/08/2012   ? [name]  ? SPINE SURGERY  03/08/2008  ? TOTAL KNEE ARTHROPLASTY Right 04/29/2021  ? Procedure: TOTAL KNEE ARTHROPLASTY;  Surgeon: Lovell Sheehan, MD;  Location: [facility] ORS;  Service: Orthopedics;  Laterality: Right;  ? VENOUS ABLATION    ? bilateral  ? ? ?Current Outpatient Medications  ?Medication Sig Dispense Refill  ? acetaminophen (TYLENOL) 325 MG tablet Take 1-2 tablets (325-650 mg total) by mouth every 6 (six) hours as needed for mild pain (pain score 1-3 or temp > 100.5). 40 tablet 1  ? albuterol (VENTOLIN HFA) 108 (90 Base) MCG/ACT inhaler Inhale 1 puff into the lungs every 6 (six) hours as needed for wheezing or shortness of breath. 1 each 0  ? allopurinol (ZYLOPRIM) 100 MG tablet TAKE 1 TABLET BY MOUTH  DAILY 90 tablet 3  ? calcium carbonate (TUMS - DOSED IN MG ELEMENTAL CALCIUM) 500 MG chewable tablet Chew 1 tablet by mouth daily as needed for indigestion or heartburn.    ? cetirizine (ZYRTEC) 10 MG tablet Take 10 mg by mouth daily as needed for allergies.    ? ezetimibe (ZETIA) 10 MG tablet TAKE 1 TABLET BY MOUTH  DAILY 90 tablet 3  ? fluocinonide cream (LIDEX) 5.63 % Apply 1 application topically 2 (two) times daily as needed (itching).    ? fluticasone (FLONASE) 50 MCG/ACT nasal spray Place 2 sprays into both nostrils daily. (Patient taking differently: Place 2 sprays into both nostrils daily as needed for allergies.) 48 g 1  ? fluticasone-salmeterol (ADVAIR) 100-50 MCG/ACT AEPB Inhale 1 puff into the lungs 2 (two) times daily. Rinse mouth after  each use. (Patient taking differently: Inhale 1 puff into the lungs 2 (two) times daily as needed (shortness of breath). Rinse mouth after each use.) 1 each 3  ? metoprolol succinate (TOPROL-XL) 25 MG 24 hr tablet TAKE 1 TABLET BY MOUTH  DAILY (Patient taking differently: Take 25 mg by mouth at bedtime.) 90 tablet 3  ? pantoprazole (PROTONIX) 40 MG tablet TAKE 1 TABLET BY MOUTH  DAILY 90 tablet 3  ? PREMARIN vaginal cream Place 1 Applicatorful vaginally 2 (two)  times a week. (Patient taking differently: Place 1 applicator vaginally daily as needed (dryness / irrtation).) 30 g 0  ? sertraline (ZOLOFT) 100 MG tablet TAKE 1 TABLET BY MOUTH  DAILY 90 tablet 3  ? simvastatin (ZOCOR) 40 MG tablet Take 1 tablet (40 mg total) by mouth at bedtime. 90 tablet 3  ? traMADol (ULTRAM) 50 MG tablet Take 50 mg by mouth every 6 (six) hours as needed.    ? VITAMIN D PO Take 1 capsule by mouth daily.    ? apixaban (ELIQUIS) 5 MG TABS tablet Take 1 tablet (5 mg total) by mouth 2 (two) times daily. 60 tablet 4  ? clobetasol cream (TEMOVATE) 0.05 % Apply to itchy rash on back 1-2 times a day until improved. Avoid face, groin, axilla. (Patient not taking: Reported on 05/26/2021) 30 g 1  ? docusate sodium (COLACE) 100 MG capsule Take 1 capsule (100 mg total) by mouth 2 (two) times daily. (Patient not taking: Reported on 05/26/2021) 10 capsule 0  ? HYDROcodone-acetaminophen (NORCO/VICODIN) 5-325 MG tablet Take 1-2 tablets by mouth every 4 (four) hours as needed for moderate pain (pain score 4-6). (Patient not taking: Reported on 05/26/2021) 30 tablet 0  ? losartan (COZAAR) 100 MG tablet Take 1 tablet (100 mg total) by mouth daily. 90 tablet 3  ? methocarbamol (ROBAXIN) 500 MG tablet Take 1 tablet (500 mg total) by mouth every 6 (six) hours as needed for muscle spasms. (Patient not taking: Reported on 05/26/2021) 30 tablet 0  ? nystatin (MYCOSTATIN) 100000 UNIT/ML suspension Take 5 mLs (500,000 Units total) by mouth 4 (four) times daily. (Patient not taking: Reported on 05/26/2021) 60 mL 5  ? telmisartan (MICARDIS) 80 MG tablet Take 1 tablet (80 mg total) by mouth daily. (Patient not taking: Reported on 05/26/2021) 90 tablet 0  ? ?No current facility-administered medications for this visit.  ? ? ? ?Allergies:   Clarithromycin, Colloidal oatmeal, Dust mite extract, Oat, Other, and Shrimp [shellfish allergy]  ? ?Social History:  The patient  reports that she has never smoked. She has never used smokeless  tobacco. She reports that she does not drink alcohol and does not use drugs.  ? ?Family History:   family history includes Breast cancer (age of onset: 10) in her sister; Cancer in her brother; Early death in her mother; Heart attack in her father; Heart disease in her brother, father, and sister.  ? ? ?Review of Systems: ?Review of Systems  ?Constitutional: Negative.   ?Eyes: Negative.   ?Respiratory: Negative.    ?Cardiovascular: Negative.   ?Gastrointestinal: Negative.   ?Genitourinary: Negative.   ?Musculoskeletal:  Positive for back pain and neck pain.  ?Neurological: Negative.   ?Psychiatric/Behavioral: Negative.    ?All other systems reviewed and are negative. ? ?PHYSICAL EXAM: ?VS:  BP 140/64 (BP Location: Left Arm, Patient Position: Sitting, Cuff Size: Normal)   Pulse 91   Ht 5' 4.5" (1.638 m)   Wt 179 lb (81.2 kg)   SpO2 98%  BMI 30.25 kg/m?  , BMI Body mass index is 30.25 kg/m?[place] Kitchen ?Constitutional:  oriented to person, place, and time. No distress.  ?HENT:  ?Head: Grossly normal ?Eyes:  no discharge. No scleral icterus.  ?Neck: No JVD, no carotid bruits  ?Cardiovascular: Regular rate and rhythm, no murmurs appreciated ?Pulmonary/Chest: Clear to auscultation bilaterally, no wheezes or rails ?Abdominal: Soft.  no distension.  no tenderness.  ?Musculoskeletal: Normal range of motion ?Neurological:  normal muscle tone. Coordination normal. No atrophy ?Skin: Skin warm and dry ?Psychiatric: normal affect, pleasant ? ? ?Recent Labs: ?05/03/2021: B Natriuretic Peptide 223.0; TSH 0.947 ?05/04/2021: ALT 18; BUN 28; Creatinine, Ser 0.91; Hemoglobin 9.6; Platelets 201; Potassium 3.9; Sodium 136  ? ? ?Lipid Panel ?Lab Results  ?Component Value Date  ? CHOL 166 07/29/2020  ? HDL 72.80 07/29/2020  ? [place] 69 07/29/2020  ? TRIG 122.0 07/29/2020  ? ? ?Wt Readings from Last 3 Encounters:  ?05/26/21 179 lb (81.2 kg)  ?04/29/21 188 lb 15 oz (85.7 kg)  ?04/16/21 189 lb (85.7 kg)  ?  ? ?ASSESSMENT AND PLAN: ? ?Atypical  chest pain  ?No recent symptoms, no further work-up at this time ? ?Aortic atherosclerosis ([place])  ?Moderate aortic atherosclerosis ?Cholesterol at goal ? ?Essential hypertension ?Blood pressure is well controll

## 2021-05-27 DIAGNOSIS — M2560 Stiffness of unspecified joint, not elsewhere classified: Secondary | ICD-10-CM | POA: Diagnosis not present

## 2021-05-27 DIAGNOSIS — M25561 Pain in right knee: Secondary | ICD-10-CM | POA: Diagnosis not present

## 2021-05-29 DIAGNOSIS — M25561 Pain in right knee: Secondary | ICD-10-CM | POA: Diagnosis not present

## 2021-05-29 DIAGNOSIS — M25661 Stiffness of right knee, not elsewhere classified: Secondary | ICD-10-CM | POA: Diagnosis not present

## 2021-06-02 ENCOUNTER — Ambulatory Visit: Payer: Medicare Other | Admitting: Dermatology

## 2021-06-03 ENCOUNTER — Other Ambulatory Visit: Payer: Self-pay | Admitting: Orthopedic Surgery

## 2021-06-03 ENCOUNTER — Ambulatory Visit: Payer: Medicare Other

## 2021-06-03 DIAGNOSIS — M25561 Pain in right knee: Secondary | ICD-10-CM | POA: Diagnosis not present

## 2021-06-03 DIAGNOSIS — Z96651 Presence of right artificial knee joint: Secondary | ICD-10-CM

## 2021-06-03 DIAGNOSIS — M25661 Stiffness of right knee, not elsewhere classified: Secondary | ICD-10-CM | POA: Diagnosis not present

## 2021-06-05 DIAGNOSIS — M25561 Pain in right knee: Secondary | ICD-10-CM | POA: Diagnosis not present

## 2021-06-05 DIAGNOSIS — M25661 Stiffness of right knee, not elsewhere classified: Secondary | ICD-10-CM | POA: Diagnosis not present

## 2021-06-08 ENCOUNTER — Encounter: Payer: Self-pay | Admitting: Internal Medicine

## 2021-06-08 ENCOUNTER — Ambulatory Visit (INDEPENDENT_AMBULATORY_CARE_PROVIDER_SITE_OTHER): Payer: Medicare Other | Admitting: Internal Medicine

## 2021-06-08 VITALS — BP 138/72 | HR 76 | Temp 97.9°F | Ht 64.5 in | Wt 180.6 lb

## 2021-06-08 DIAGNOSIS — I82461 Acute embolism and thrombosis of right calf muscular vein: Secondary | ICD-10-CM | POA: Diagnosis not present

## 2021-06-08 DIAGNOSIS — D62 Acute posthemorrhagic anemia: Secondary | ICD-10-CM | POA: Diagnosis not present

## 2021-06-08 DIAGNOSIS — I951 Orthostatic hypotension: Secondary | ICD-10-CM

## 2021-06-08 DIAGNOSIS — I480 Paroxysmal atrial fibrillation: Secondary | ICD-10-CM | POA: Diagnosis not present

## 2021-06-08 DIAGNOSIS — N1831 Chronic kidney disease, stage 3a: Secondary | ICD-10-CM | POA: Diagnosis not present

## 2021-06-08 DIAGNOSIS — F320 Major depressive disorder, single episode, mild: Secondary | ICD-10-CM

## 2021-06-08 NOTE — Assessment & Plan Note (Addendum)
Occurring post operatively and in the setting of PAF as well.  Continued use of Eliquis (for GFR < 60) for a minimum of 3 months.  Given the episode of PAF, will reassess rhythm prior to d/c Eliquis   ?

## 2021-06-08 NOTE — Patient Instructions (Addendum)
Increase  your tramadol to one tablet every 6 hours IF NEEDED FOR PAIN.   Make sure you take one before you leave the house for PHYSICAL THERAPY  ? ?DO NOT TAKE 2 AT ONE TIME BUT OK TO COMBINE WITH TYLENOL  ? ?Get a life Alert!!!   ? ?You may require iron to improve your anemia.  I will know after these labs have been done ? ? ? ? ?

## 2021-06-08 NOTE — Progress Notes (Signed)
? ?Subjective:  ?Patient ID: Jamie Anderson, female    DOB: [birth year]  Age: 86 y.o. MRN: [id number] ? ?CC: The primary encounter diagnosis was Acute blood loss as cause of postoperative anemia. Diagnoses of Paroxysmal A-fib ([place]), Acute deep vein thrombosis (DVT) of calf muscle vein of right lower extremity (HCC), Orthostatic hypotension, Major depressive disorder, single episode, mild ([place]), and Stage 3a chronic kidney disease ([place]) were also pertinent to this visit. ? ? ?This visit occurred during the SARS-CoV-2 public health emergency.  Safety protocols were in place, including screening questions prior to the visit, additional usage of staff PPE, and extensive cleaning of exam room while observing appropriate contact time as indicated for disinfecting solutions.   ? ?HPI ?Jamie Anderson presents for follow up after being released from rehab facility. ? ?Jamie Anderson is an 86 yr old female with aortic atherosclerosis,  CKD  stage 3 and bronchiectasis who recently underwent TKR of right knee on Feb 22 at [facility].she was discharged on feb 28 from [facility] to [facility] rehab.  ?During her hospitalization  on POD #2 she was noted to develop profound generalized weakness complicated bu presyncope and  significant orthostasis, in the setting of postoperative drop in hemoglobin from 12.4 to 9.6 .  She was . diagnosed with Paroxysmal atrial fib and RLE DVT . Troponin was quite elevated but was not repeated.  She was not transfused.  She was started on  Eliquis and discharged to skilled nursing /rehab on Feb 28 .  She  has not had follow up labs but has had follow up with cardiology and was noted to be in sinus rhythm.       ? ?She is accompanied by her daughter Jamie Anderson today, who provides much of the details of her hospitailization. .  Progress with Rehab at [facility] was disappointing and her recovery has been slow.  She is now receiving outpatient  PT at [facility] currently due to failure to improve at [facility] . After discharge  she stayed with [facility] for a week,  but  due to renovation at [facility] causing disruption , she has returned to her own home.  She has two elderly neighbors who check on her daily.  She is using a walker and she is not wearring a life alert.  ? ?Pain is managed with tramadol suboptimally.  Taking it twice daily  and taking tylenol daily  ? ?Her apptite has been poor since discharge and she has been  avoiding drinking adequate water intake due to odynophagia with cold water.   ? ? ?Outpatient Medications Prior to Visit  ?Medication Sig Dispense Refill  ? acetaminophen (TYLENOL) 325 MG tablet Take 1-2 tablets (325-650 mg total) by mouth every 6 (six) hours as needed for mild pain (pain score 1-3 or temp > 100.5). 40 tablet 1  ? albuterol (VENTOLIN HFA) 108 (90 Base) MCG/ACT inhaler Inhale 1 puff into the lungs every 6 (six) hours as needed for wheezing or shortness of breath. 1 each 0  ? allopurinol (ZYLOPRIM) 100 MG tablet TAKE 1 TABLET BY MOUTH  DAILY 90 tablet 3  ? apixaban (ELIQUIS) 5 MG TABS tablet Take 1 tablet (5 mg total) by mouth 2 (two) times daily. 60 tablet 4  ? calcium carbonate (TUMS - DOSED IN MG ELEMENTAL CALCIUM) 500 MG chewable tablet Chew 1 tablet by mouth daily as needed for indigestion or heartburn.    ? cetirizine (ZYRTEC) 10 MG tablet Take 10 mg by mouth daily  as needed for allergies.    ? ezetimibe (ZETIA) 10 MG tablet TAKE 1 TABLET BY MOUTH  DAILY 90 tablet 3  ? fluticasone (FLONASE) 50 MCG/ACT nasal spray Place 2 sprays into both nostrils daily. (Patient taking differently: Place 2 sprays into both nostrils daily as needed for allergies.) 48 g 1  ? fluticasone-salmeterol (ADVAIR) 100-50 MCG/ACT AEPB Inhale 1 puff into the lungs 2 (two) times daily. Rinse mouth after each use. (Patient taking differently: Inhale 1 puff into the lungs 2 (two) times daily as needed (shortness of breath). Rinse mouth after each use.) 1 each 3  ? losartan (COZAAR) 100 MG tablet Take 1 tablet (100 mg total) by mouth  daily. 90 tablet 3  ? metoprolol succinate (TOPROL-XL) 25 MG 24 hr tablet TAKE 1 TABLET BY MOUTH  DAILY (Patient taking differently: Take 25 mg by mouth at bedtime.) 90 tablet 3  ? pantoprazole (PROTONIX) 40 MG tablet TAKE 1 TABLET BY MOUTH  DAILY 90 tablet 3  ? PREMARIN vaginal cream Place 1 Applicatorful vaginally 2 (two) times a week. (Patient taking differently: Place 1 applicator vaginally daily as needed (dryness / irrtation).) 30 g 0  ? sertraline (ZOLOFT) 100 MG tablet TAKE 1 TABLET BY MOUTH  DAILY 90 tablet 3  ? simvastatin (ZOCOR) 40 MG tablet Take 1 tablet (40 mg total) by mouth at bedtime. 90 tablet 3  ? traMADol (ULTRAM) 50 MG tablet Take 50 mg by mouth every 6 (six) hours as needed.    ? VITAMIN D PO Take 1,000 Int'l Units/day by mouth daily.    ? clobetasol cream (TEMOVATE) 0.05 % Apply to itchy rash on back 1-2 times a day until improved. Avoid face, groin, axilla. (Patient not taking: Reported on 05/26/2021) 30 g 1  ? docusate sodium (COLACE) 100 MG capsule Take 1 capsule (100 mg total) by mouth 2 (two) times daily. (Patient not taking: Reported on 06/08/2021) 10 capsule 0  ? fluocinonide cream (LIDEX) 7.65 % Apply 1 application topically 2 (two) times daily as needed (itching). (Patient not taking: Reported on 06/08/2021)    ? HYDROcodone-acetaminophen (NORCO/VICODIN) 5-325 MG tablet Take 1-2 tablets by mouth every 4 (four) hours as needed for moderate pain (pain score 4-6). (Patient not taking: Reported on 05/26/2021) 30 tablet 0  ? methocarbamol (ROBAXIN) 500 MG tablet Take 1 tablet (500 mg total) by mouth every 6 (six) hours as needed for muscle spasms. (Patient not taking: Reported on 05/26/2021) 30 tablet 0  ? nystatin (MYCOSTATIN) 100000 UNIT/ML suspension Take 5 mLs (500,000 Units total) by mouth 4 (four) times daily. (Patient not taking: Reported on 05/26/2021) 60 mL 5  ? telmisartan (MICARDIS) 80 MG tablet Take 1 tablet (80 mg total) by mouth daily. (Patient not taking: Reported on 05/26/2021) 90  tablet 0  ? ?No facility-administered medications prior to visit.  ? ? ?Review of Systems; ? ?Patient denies headache, fevers, malaise, unintentional weight loss, skin rash, eye pain, sinus congestion and sinus pain, sore throat, dysphagia,  hemoptysis , cough, dyspnea, wheezing, chest pain, palpitations, orthopnea, edema, abdominal pain, nausea, melena, diarrhea, constipation, flank pain, dysuria, hematuria, urinary  Frequency, nocturia, numbness, tingling, seizures,  Focal weakness, Loss of consciousness,  Tremor, insomnia, depression, anxiety, and suicidal ideation.   ? ? ? ?Objective:  ?BP 138/72 (BP Location: Left Arm, Patient Position: Sitting, Cuff Size: Large)   Pulse 76   Temp 97.9 ?F (36.6 ?C) (Oral)   Ht 5' 4.5" (1.638 m)   Wt 180 lb 9.6 oz (  81.9 kg)   SpO2 98%   BMI 30.52 kg/m?  ? ?BP Readings from Last 3 Encounters:  ?06/08/21 138/72  ?05/26/21 140/64  ?05/05/21 (!) 186/66  ? ? ?Wt Readings from Last 3 Encounters:  ?06/08/21 180 lb 9.6 oz (81.9 kg)  ?05/26/21 179 lb (81.2 kg)  ?04/29/21 188 lb 15 oz (85.7 kg)  ? ? ?General appearance: alert, cooperative and appears stated age ?Ears: normal TM's and external ear canals both ears ?Throat: lips, mucosa, and tongue normal; teeth and gums normal ?Neck: no adenopathy, no carotid bruit, supple, symmetrical, trachea midline and thyroid not enlarged, symmetric, no tenderness/mass/nodules ?Back: symmetric, no curvature. ROM normal. No CVA tenderness. ?Lungs: clear to auscultation bilaterally ?Heart: regular rate and rhythm, S1, S2 normal, no murmur, click, rub or gallop ?Abdomen: soft, non-tender; bowel sounds normal; no masses,  no organomegaly ?Pulses: 2+ and symmetric ?Skin: Skin color, texture, turgor normal. No rashes or lesions. Right knee I ?ncision well healed ?Lymph nodes: Cervical, supraclavicular, and axillary nodes normal. ?Ext:  no significant edema or swelling.  ?Lab Results  ?Component Value Date  ? CREATININE 0.91 05/04/2021  ? CREATININE  1.30 (H) 05/03/2021  ? CREATININE 1.12 (H) 04/30/2021  ? ? ?Lab Results  ?Component Value Date  ? WBC 7.6 05/04/2021  ? HGB 9.6 (L) 05/04/2021  ? HCT 28.5 (L) 05/04/2021  ? PLT 201 05/04/2021  ? GLUCOSE 102 (H) 02

## 2021-06-08 NOTE — Assessment & Plan Note (Addendum)
Occurred Spontaneously post operatively in the setting of post operative anemia,  orthostasis  and resolved spontaneously.  unclear if this was due to anesthesia,  Postoperative anemia  Or other causes as no ECHO was done .  troponini I was elevated at  87.  She followed up with cardiology as an outpatient.  She remains in  sinus rhythm today.Marland Kitchen  Rhythm will ntted to be  assessed again prior to the suspension of Eliquis.   ?

## 2021-06-09 DIAGNOSIS — R2242 Localized swelling, mass and lump, left lower limb: Secondary | ICD-10-CM | POA: Diagnosis not present

## 2021-06-09 DIAGNOSIS — Z96651 Presence of right artificial knee joint: Secondary | ICD-10-CM | POA: Diagnosis not present

## 2021-06-09 LAB — CBC WITH DIFFERENTIAL/PLATELET
Basophils Absolute: 0.1 10*3/uL (ref 0.0–0.1)
Basophils Relative: 1 % (ref 0.0–3.0)
Eosinophils Absolute: 0.1 10*3/uL (ref 0.0–0.7)
Eosinophils Relative: 1.9 % (ref 0.0–5.0)
HCT: 28.9 % — ABNORMAL LOW (ref 36.0–46.0)
Hemoglobin: 9.7 g/dL — ABNORMAL LOW (ref 12.0–15.0)
Lymphocytes Relative: 19.6 % (ref 12.0–46.0)
Lymphs Abs: 1.1 10*3/uL (ref 0.7–4.0)
MCHC: 33.5 g/dL (ref 30.0–36.0)
MCV: 83.8 fl (ref 78.0–100.0)
Monocytes Absolute: 0.5 10*3/uL (ref 0.1–1.0)
Monocytes Relative: 8.9 % (ref 3.0–12.0)
Neutro Abs: 3.9 10*3/uL (ref 1.4–7.7)
Neutrophils Relative %: 68.6 % (ref 43.0–77.0)
Platelets: 281 10*3/uL (ref 150.0–400.0)
RBC: 3.44 Mil/uL — ABNORMAL LOW (ref 3.87–5.11)
RDW: 16.2 % — ABNORMAL HIGH (ref 11.5–15.5)
WBC: 5.6 10*3/uL (ref 4.0–10.5)

## 2021-06-09 LAB — IBC + FERRITIN
Ferritin: 39.6 ng/mL (ref 10.0–291.0)
Iron: 29 ug/dL — ABNORMAL LOW (ref 42–145)
Saturation Ratios: 9.3 % — ABNORMAL LOW (ref 20.0–50.0)
TIBC: 312.2 ug/dL (ref 250.0–450.0)
Transferrin: 223 mg/dL (ref 212.0–360.0)

## 2021-06-09 LAB — COMPREHENSIVE METABOLIC PANEL
ALT: 9 U/L (ref 0–35)
AST: 16 U/L (ref 0–37)
Albumin: 4 g/dL (ref 3.5–5.2)
Alkaline Phosphatase: 119 U/L — ABNORMAL HIGH (ref 39–117)
BUN: 14 mg/dL (ref 6–23)
CO2: 27 mEq/L (ref 19–32)
Calcium: 9.7 mg/dL (ref 8.4–10.5)
Chloride: 106 mEq/L (ref 96–112)
Creatinine, Ser: 0.97 mg/dL (ref 0.40–1.20)
GFR: 53.99 mL/min — ABNORMAL LOW (ref >60.00)
Glucose, Bld: 82 mg/dL (ref 70–99)
Potassium: 4.3 mEq/L (ref 3.5–5.1)
Sodium: 141 mEq/L (ref 135–145)
Total Bilirubin: 0.4 mg/dL (ref 0.2–1.2)
Total Protein: 6.1 g/dL (ref 6.0–8.3)

## 2021-06-09 NOTE — Assessment & Plan Note (Addendum)
She denies suicidality and hopelessness.  Does not appear to be overtly depressed,  So no changes to sertraline dose were made. ?

## 2021-06-09 NOTE — Assessment & Plan Note (Signed)
Presumed secondary to acute drop in hemoglobin.  She was not transfused,  And is normotensive today on losartan and metoprolol.   ?

## 2021-06-09 NOTE — Assessment & Plan Note (Signed)
Stable GFR ,  Follow up with nephrology ? ?Lab Results  ?Component Value Date  ? CREATININE 0.91 05/04/2021  ? ? ?

## 2021-06-10 ENCOUNTER — Other Ambulatory Visit: Payer: Self-pay | Admitting: Internal Medicine

## 2021-06-10 DIAGNOSIS — M25561 Pain in right knee: Secondary | ICD-10-CM | POA: Diagnosis not present

## 2021-06-10 DIAGNOSIS — D509 Iron deficiency anemia, unspecified: Secondary | ICD-10-CM

## 2021-06-10 DIAGNOSIS — M25661 Stiffness of right knee, not elsewhere classified: Secondary | ICD-10-CM | POA: Diagnosis not present

## 2021-06-10 MED ORDER — IRON (FERROUS SULFATE) 325 (65 FE) MG PO TABS: 325.0000 mg | ORAL_TABLET | Freq: Every day | ORAL | 0 refills | Status: DC

## 2021-06-11 ENCOUNTER — Telehealth: Payer: Self-pay

## 2021-06-11 NOTE — Telephone Encounter (Signed)
LMTCB in regards to lab results.  

## 2021-06-11 NOTE — Telephone Encounter (Signed)
Called pt back to let her know the iron supplement has been sent in to her pharmacy.  ?

## 2021-06-11 NOTE — Telephone Encounter (Signed)
Pt called in and I read the message to her and she does not know what kind iron supplement to get. Pt does not think she has taken OTC iron before ?

## 2021-06-11 NOTE — Telephone Encounter (Signed)
-----   Message from Crecencio Mc, MD sent at 06/10/2021 12:58 PM EDT ----- ?You are still anemic and the additional labs indicate your iron stores are low, If you can tolerate a daily OTC iron supplement without nausea or constipation, I would recommend taking one daily with food or 6 ounces of orange juice,  and repeat your iron and CBC in 6 weeks . Your other labs are normal.  ? ?Regards, ? ?Dr. Derrel Nip ?  ?

## 2021-06-17 DIAGNOSIS — M25561 Pain in right knee: Secondary | ICD-10-CM | POA: Diagnosis not present

## 2021-06-17 DIAGNOSIS — M25661 Stiffness of right knee, not elsewhere classified: Secondary | ICD-10-CM | POA: Diagnosis not present

## 2021-06-19 DIAGNOSIS — M25661 Stiffness of right knee, not elsewhere classified: Secondary | ICD-10-CM | POA: Diagnosis not present

## 2021-06-19 DIAGNOSIS — M25561 Pain in right knee: Secondary | ICD-10-CM | POA: Diagnosis not present

## 2021-06-22 ENCOUNTER — Other Ambulatory Visit: Payer: Self-pay | Admitting: Internal Medicine

## 2021-06-24 DIAGNOSIS — M25561 Pain in right knee: Secondary | ICD-10-CM | POA: Diagnosis not present

## 2021-06-24 DIAGNOSIS — M25661 Stiffness of right knee, not elsewhere classified: Secondary | ICD-10-CM | POA: Diagnosis not present

## 2021-06-26 DIAGNOSIS — M25561 Pain in right knee: Secondary | ICD-10-CM | POA: Diagnosis not present

## 2021-06-26 DIAGNOSIS — M25661 Stiffness of right knee, not elsewhere classified: Secondary | ICD-10-CM | POA: Diagnosis not present

## 2021-06-29 ENCOUNTER — Ambulatory Visit: Payer: Medicare Other | Admitting: Dermatology

## 2021-06-30 ENCOUNTER — Other Ambulatory Visit: Payer: Self-pay | Admitting: Internal Medicine

## 2021-06-30 DIAGNOSIS — M25561 Pain in right knee: Secondary | ICD-10-CM

## 2021-07-01 DIAGNOSIS — M25661 Stiffness of right knee, not elsewhere classified: Secondary | ICD-10-CM | POA: Diagnosis not present

## 2021-07-01 DIAGNOSIS — M25561 Pain in right knee: Secondary | ICD-10-CM | POA: Diagnosis not present

## 2021-07-03 DIAGNOSIS — M25661 Stiffness of right knee, not elsewhere classified: Secondary | ICD-10-CM | POA: Diagnosis not present

## 2021-07-03 DIAGNOSIS — M25561 Pain in right knee: Secondary | ICD-10-CM | POA: Diagnosis not present

## 2021-07-09 DIAGNOSIS — R079 Chest pain, unspecified: Secondary | ICD-10-CM | POA: Diagnosis not present

## 2021-07-09 DIAGNOSIS — M25561 Pain in right knee: Secondary | ICD-10-CM | POA: Diagnosis not present

## 2021-07-09 DIAGNOSIS — M542 Cervicalgia: Secondary | ICD-10-CM | POA: Diagnosis not present

## 2021-07-09 DIAGNOSIS — M25661 Stiffness of right knee, not elsewhere classified: Secondary | ICD-10-CM | POA: Diagnosis not present

## 2021-07-09 DIAGNOSIS — Z5321 Procedure and treatment not carried out due to patient leaving prior to being seen by health care provider: Secondary | ICD-10-CM | POA: Diagnosis not present

## 2021-07-09 DIAGNOSIS — R42 Dizziness and giddiness: Secondary | ICD-10-CM | POA: Diagnosis not present

## 2021-07-09 DIAGNOSIS — M79602 Pain in left arm: Secondary | ICD-10-CM | POA: Diagnosis not present

## 2021-07-10 ENCOUNTER — Telehealth: Payer: Self-pay

## 2021-07-10 DIAGNOSIS — M79602 Pain in left arm: Secondary | ICD-10-CM | POA: Diagnosis not present

## 2021-07-10 DIAGNOSIS — Z5321 Procedure and treatment not carried out due to patient leaving prior to being seen by health care provider: Secondary | ICD-10-CM | POA: Diagnosis not present

## 2021-07-10 DIAGNOSIS — R079 Chest pain, unspecified: Secondary | ICD-10-CM | POA: Diagnosis not present

## 2021-07-10 DIAGNOSIS — R42 Dizziness and giddiness: Secondary | ICD-10-CM | POA: Diagnosis not present

## 2021-07-10 DIAGNOSIS — M542 Cervicalgia: Secondary | ICD-10-CM | POA: Diagnosis not present

## 2021-07-10 NOTE — Telephone Encounter (Signed)
Patient's daughter, Meredith Mody, called to say her mom was at [facility] yesterday and she was having dizzy spells and her blood pressure was high.  Joy states they took patient to [place] last night and they ran tests, which she said we should have access to.  Please call. ?

## 2021-07-10 NOTE — Telephone Encounter (Signed)
Noted. Please follow-up later this afternoon to ensure the patient went to be evaluated.  ?

## 2021-07-10 NOTE — Telephone Encounter (Signed)
FYI I called patient's daughter Caryl Asp & patient refusing to go to ED. She said that she had called her cardiologist & was going to see what they said. She also said that her mom said the pain in her left arm she only felt really once today & that she has not been dizzy standing up bc she has been taking her time. I advised still ED for evaluation of possible stroke or blood clot & daughter verbalized understanding but patient not willing at this time. They will call back Monday to see about arranging appointment if patient does not end up going to ED over the weekend.  ?

## 2021-07-10 NOTE — Telephone Encounter (Signed)
I called and spoke with patient's daughter Caryl Asp. She stated that patient was having dizzy spells at [facility] yesterday & she went to pick up her mother. She complained of left arm pain & tingling in her finger tips down through shoulder. Pt was seen at [place] had two EKG's ran as well as labs. Everything came back as normal. Pt's grandson is an EMT & thought she was having a systemic heart attack but when all was normal at [place] [place] patient just wanted to come home. They never officially saw physician on call there. Today when BP was checked was 178/78 & patient had stated that her arm was still hurting. Pt's daughter had hoped that Dr. Olivia Mackie could see patient today but we were fully booked. I did use two SAME DAY appointments for patient to see Dr. Derrel Nip as she requested on Tuesday. I advised that it was Friday & if anything happened over the weekend or Monday that they would have to take patient back to ED ASAP. Pt's daughter verbalized understanding of this as we are not equipped to treat that urgent of an issue in office.  ?

## 2021-07-10 NOTE — Telephone Encounter (Signed)
FYI after speaking again with daughter she was willing to take mom to [facility] for further evaluation. She stated that she felt that her mom was eager to leave [facility] bc she was so far away from her cat. She felt that patient did need further evaluation & patient does have hx of blood clot after knee surgery. I advised that after she is seen that she call back then we could schedule ED or hopital f/u.  ?

## 2021-07-10 NOTE — Telephone Encounter (Signed)
Noted. Agree with ED evaluation advice given. Please make sure you follow-up with them next week to get her an appointment set up.  ?

## 2021-07-13 NOTE — Progress Notes (Signed)
? ?Cardiology Office Note   ? ?Date:  07/14/2021  ? ?ID:  Jamie Anderson, DOB 03-04-1936, MRN [id number] ? ?PCP:  Crecencio Mc, MD  ?Cardiologist:  Ida Rogue, MD  ?Electrophysiologist:  None  ? ?Chief Complaint: Left shoulder/arm pain and dizziness ? ?History of Present Illness:  ? ?Jamie Anderson is a 86 y.o. female with history of coronary artery calcification and aortic atherosclerosis noted on CT imaging, HTN, HLD, right lower extremity DVT following knee surgery in 04/2021 treated with apixaban with CT chest showing no PE, M?ni?re's disease, and chronic back pain who presents for evaluation of left shoulder/arm pain and dizziness. ? ?She was recently seen in the office in 05/2021 for follow-up.  She was without symptoms of angina or decompensation at that time.  No prior cardiac testing available for review.  No changes were made at that time. ? ?She presented to and outside ED on 07/10/2021 with dizziness and left arm/neck pain.  High-sensitivity troponin negative x2.  Chest x-ray without acute cardiopulmonary process.  EKG unavailable for review.  She left without being seen.  She discussed the case further over the phone with her PCP's office with the plan for the patient to be seen at the [facility] ED, however she refused.  She contacted our office and appointment was made for today. ? ?She comes in today accompanied by her daughter.  She reports onset of left shoulder discomfort and dizziness on 5/3.  Discomfort would last for approximately 15 minutes and spontaneously resolved.  Initially, she wondered if this discomfort was related to her leg exercises following her knee surgery, however she has had episodes of discomfort when she was not exercising.  She also notes an increase in baseline generalized malaise and fatigue.  No dyspnea, palpitations, near-syncope, or syncope.  Lower extremity swelling has been largely stable, though may be a little bit worse today.  She has been adherent to apixaban, only  having missed a rare p.m. dose, though none recently.  No falls or symptoms concerning for bleeding.  With regards to her dizziness, she indicates this is described as a room spinning sensation.  She does have a known history of vertigo with M?ni?re's disease.  She does try and change positions slowly. ? ? ?Labs independently reviewed: ?07/2021 - Hgb 11.4, PLT 239, potassium 4.8 with hemolysis noted, BUN 13, serum creatinine 1.0 ?06/2021 - albumin 4.0, AST/ALT normal ?04/2021 - TSH normal ?07/2020 - TC 166, TG 122, HDL 72, LDL 69 ?01/2018 - A1c 5.5 ? ?Past Medical History:  ?Diagnosis Date  ? Arthritis   ? Asthma   ? Basal cell carcinoma 01/17/2018  ? Left chest. Infiltrative. Excised 02/06/2018, margins free.  ? Cystic teratoma of right ovary 11/23/2012  ? She underwent right salpingo-oophorectomy on April 1 by Dr. Kenton Kingfisher for removal of what turned out to be a cystic teratoma. Hysteroscopy was also done at the time due to postmenopausal bleeding. It is unclear  why the left ovary was not taken out since she is postmenopausal.   ? Dyspnea   ? GERD (gastroesophageal reflux disease)   ? Granuloma annulare   ? Hyperlipidemia   ? Hypertension   ? Menopause   ? Plantar fasciitis of right foot 12/24/2014  ? Right knee DJD 04/03/2012  ? SCC (squamous cell carcinoma) 10/08/2020  ? R mid lower pretibia, EDC  ? SCC (squamous cell carcinoma) 03/16/2021  ? R upper pretibial, needs EDC pt sched 06/02/21  ? Squamous cell carcinoma of  skin 02/26/2020  ? R lower pretibial, clear with bx 04/01/20  ? Squamous cell carcinoma of skin 07/15/2020  ? R mid pretibia, EDC  ? Squamous cell carcinoma of skin 12/09/2020  ? R lateral pretibia, EDC  ? Stage 3b chronic kidney disease (CKD) ([place])   ? ? ?Past Surgical History:  ?Procedure Laterality Date  ? APPENDECTOMY    ? CATARACT EXTRACTION, BILATERAL    ? DILATION AND CURETTAGE OF UTERUS    ? RIGHT OOPHORECTOMY Right 04/08/2012  ? [name]  ? SPINE SURGERY  03/08/2008  ? TOTAL KNEE ARTHROPLASTY Right  04/29/2021  ? Procedure: TOTAL KNEE ARTHROPLASTY;  Surgeon: Lovell Sheehan, MD;  Location: [facility] ORS;  Service: Orthopedics;  Laterality: Right;  ? VENOUS ABLATION    ? bilateral  ? ? ?Current Medications: ?Current Meds  ?Medication Sig  ? acetaminophen (TYLENOL) 325 MG tablet Take 1-2 tablets (325-650 mg total) by mouth every 6 (six) hours as needed for mild pain (pain score 1-3 or temp > 100.5).  ? albuterol (VENTOLIN HFA) 108 (90 Base) MCG/ACT inhaler Inhale 1 puff into the lungs every 6 (six) hours as needed for wheezing or shortness of breath.  ? allopurinol (ZYLOPRIM) 100 MG tablet TAKE 1 TABLET BY MOUTH  DAILY  ? apixaban (ELIQUIS) 5 MG TABS tablet Take 1 tablet (5 mg total) by mouth 2 (two) times daily.  ? calcium carbonate (TUMS - DOSED IN MG ELEMENTAL CALCIUM) 500 MG chewable tablet Chew 1 tablet by mouth daily as needed for indigestion or heartburn.  ? cetirizine (ZYRTEC) 10 MG tablet Take 10 mg by mouth daily as needed for allergies.  ? clobetasol cream (TEMOVATE) 0.05 % Apply to itchy rash on back 1-2 times a day until improved. Avoid face, groin, axilla.  ? conjugated estrogens (PREMARIN) vaginal cream Place 1 applicator vaginally as needed.  ? ezetimibe (ZETIA) 10 MG tablet TAKE 1 TABLET BY MOUTH  DAILY  ? fluocinonide cream (LIDEX) 1.70 % Apply 1 application. topically 2 (two) times daily as needed (itching).  ? fluticasone (FLONASE) 50 MCG/ACT nasal spray Place 2 sprays into both nostrils daily as needed for allergies or rhinitis.  ? fluticasone-salmeterol (ADVAIR) 100-50 MCG/ACT AEPB Inhale 1 puff into the lungs 2 (two) times daily as needed.  ? Iron, Ferrous Sulfate, 325 (65 Fe) MG TABS Take 325 mg by mouth daily. With  food or orange juice  ? losartan (COZAAR) 100 MG tablet Take 1 tablet (100 mg total) by mouth daily.  ? meclizine (ANTIVERT) 25 MG tablet Take 1 tablet (25 mg total) by mouth 2 (two) times daily.  ? metoprolol succinate (TOPROL-XL) 25 MG 24 hr tablet Take 1 tablet (25 mg total) by  mouth at bedtime.  ? pantoprazole (PROTONIX) 40 MG tablet TAKE 1 TABLET BY MOUTH  DAILY  ? sertraline (ZOLOFT) 100 MG tablet TAKE 1 TABLET BY MOUTH  DAILY  ? simvastatin (ZOCOR) 40 MG tablet Take 1 tablet (40 mg total) by mouth at bedtime.  ? traMADol (ULTRAM) 50 MG tablet Take 50 mg by mouth every 6 (six) hours as needed.  ? VITAMIN D PO Take 1,000 Int'l Units/day by mouth daily.  ? [DISCONTINUED] metoprolol tartrate (LOPRESSOR) 100 MG tablet Take 1 tablet (100 mg total) by mouth once for 1 dose.  ? ? ?Allergies:   Clarithromycin, Colloidal oatmeal, Dust mite extract, Oat, Other, and Shrimp [shellfish allergy]  ? ?Social History  ? ?Socioeconomic History  ? Marital status: Widowed  ?  Spouse name: Not  on file  ? Number of children: Not on file  ? Years of education: Not on file  ? Highest education level: Not on file  ?Occupational History  ? Not on file  ?Tobacco Use  ? Smoking status: Never  ? Smokeless tobacco: Never  ?Vaping Use  ? Vaping Use: Never used  ?Substance and Sexual Activity  ? Alcohol use: No  ?  Alcohol/week: 4.0 standard drinks  ?  Types: 4 Glasses of wine per week  ? Drug use: No  ? Sexual activity: Not Currently  ?Other Topics Concern  ? Not on file  ?Social History Narrative  ? Not on file  ? ?Social Determinants of Health  ? ?Financial Resource Strain: Low Risk   ? Difficulty of Paying Living Expenses: Not hard at all  ?Food Insecurity: No Food Insecurity  ? Worried About Charity fundraiser in the Last Year: Never true  ? Ran Out of Food in the Last Year: Never true  ?Transportation Needs: No Transportation Needs  ? Lack of Transportation (Medical): No  ? Lack of Transportation (Non-Medical): No  ?Physical Activity: Unknown  ? Days of Exercise per Week: 0 days  ? Minutes of Exercise per Session: Not on file  ?Stress: No Stress Concern Present  ? Feeling of Stress : Not at all  ?Social Connections: Unknown  ? Frequency of Communication with Friends and Family: More than three times a week  ?  Frequency of Social Gatherings with Friends and Family: More than three times a week  ? Attends Religious Services: Not on file  ? Active Member of Clubs or Organizations: Not on file  ? Attends Club or [place]

## 2021-07-13 NOTE — Telephone Encounter (Signed)
FYI I spoke wit patient & she stated when she was seen at [facility] she was not having chest pain. She said that she has had neck & shoulder pain. She said that she feels this does stem from her having a bad back. She said that she also has a hx of Meniere's disease & not sure of maybe something of that nature is causing her dizziness during PT. She said also Eliquis can make her dizzy. She had just taken a walk outside with walker when I spoke with her as she gets tired of just sitting around. She sees cardiology tomorrow for follow-up to be sure nothing heart related as some of her labs done at [facility] were abnormal. She wants to talk with you about maybe postponing the PT & feels she may need to handle the dizziness she is dealing with now first. She is scheduled to follow-up with you [place].  ?

## 2021-07-14 ENCOUNTER — Ambulatory Visit: Payer: Medicare Other | Admitting: Internal Medicine

## 2021-07-14 ENCOUNTER — Ambulatory Visit: Payer: Medicare Other | Admitting: Physician Assistant

## 2021-07-14 ENCOUNTER — Other Ambulatory Visit
Admission: RE | Admit: 2021-07-14 | Discharge: 2021-07-14 | Disposition: A | Discharge status: Home / Self Care | Payer: Medicare Other | Source: Ambulatory Visit | Attending: Physician Assistant | Admitting: Physician Assistant

## 2021-07-14 ENCOUNTER — Encounter: Payer: Self-pay | Admitting: Physician Assistant

## 2021-07-14 VITALS — BP 138/81 | HR 88 | Ht 64.5 in | Wt 177.0 lb

## 2021-07-14 DIAGNOSIS — E785 Hyperlipidemia, unspecified: Secondary | ICD-10-CM

## 2021-07-14 DIAGNOSIS — Z86718 Personal history of other venous thrombosis and embolism: Secondary | ICD-10-CM | POA: Diagnosis not present

## 2021-07-14 DIAGNOSIS — M25512 Pain in left shoulder: Secondary | ICD-10-CM | POA: Diagnosis not present

## 2021-07-14 DIAGNOSIS — R072 Precordial pain: Secondary | ICD-10-CM | POA: Diagnosis not present

## 2021-07-14 DIAGNOSIS — R42 Dizziness and giddiness: Secondary | ICD-10-CM | POA: Diagnosis not present

## 2021-07-14 DIAGNOSIS — I10 Essential (primary) hypertension: Secondary | ICD-10-CM | POA: Diagnosis not present

## 2021-07-14 DIAGNOSIS — I2510 Atherosclerotic heart disease of native coronary artery without angina pectoris: Secondary | ICD-10-CM

## 2021-07-14 DIAGNOSIS — I700 Atherosclerosis of aorta: Secondary | ICD-10-CM

## 2021-07-14 DIAGNOSIS — I2584 Coronary atherosclerosis due to calcified coronary lesion: Secondary | ICD-10-CM

## 2021-07-14 DIAGNOSIS — R079 Chest pain, unspecified: Secondary | ICD-10-CM | POA: Diagnosis not present

## 2021-07-14 LAB — TROPONIN I (HIGH SENSITIVITY): Troponin I (High Sensitivity): 5 ng/L (ref <18)

## 2021-07-14 MED ORDER — MECLIZINE HCL 25 MG PO TABS: 25.0000 mg | ORAL_TABLET | Freq: Two times a day (BID) | ORAL | 3 refills | Status: DC

## 2021-07-14 MED ORDER — METOPROLOL TARTRATE 100 MG PO TABS: 100.0000 mg | ORAL_TABLET | Freq: Once | ORAL | 0 refills | Status: DC

## 2021-07-14 NOTE — Patient Instructions (Addendum)
Medication Instructions:  ?Your physician has recommended you make the following change in your medication:  ? ?START Meclizine 25 mg twice a day as needed for dizziness ? ?*If you need a refill on your cardiac medications before your next appointment, please call your pharmacy* ? ? ?Lab Work: ?STAT troponin today ? ?If you have labs (blood work) drawn today and your tests are completely normal, you will receive your results only by: ?MyChart Message (if you have MyChart) OR ?A paper copy in the mail ?If you have any lab test that is abnormal or we need to change your treatment, we will call you to review the results. ? ? ?Testing/Procedures: ?Your physician has requested that you have an echocardiogram. Echocardiography is a painless test that uses sound waves to create images of your heart. It provides your doctor with information about the size and shape of your heart and how well your heart?s chambers and valves are working. This procedure takes approximately one hour. There are no restrictions for this procedure. ? ? ? ?Your cardiac CT is scheduled at the below location:  ? ? ?[place] [address] ?[address] ?[place], [place] [address] ?[phone] ? ? ?Scheduled for Thursday 07/16/21 at 2:30 pm. Please arrive at 2:15 pm for check-in and test prep.  ? ? ?Please follow these instructions carefully (unless otherwise directed): ? ?Hold all erectile dysfunction medications at least 3 days (72 hrs) prior to test. ? ?On the Night Before the Test: ?Be sure to Drink plenty of water. ?Do not consume any caffeinated/decaffeinated beverages or chocolate 12 hours prior to your test. ?Do not take any antihistamines 12 hours prior to your test. ? ? ?On the Day of the Test: ?Drink plenty of water until 1 hour prior to the test. ?Do not eat any food 4 hours prior to the test. ?You may take your regular medications prior to the test.  ?Take metoprolol (Lopressor) 100 mg two hours prior  to test. ?HOLD Furosemide/Hydrochlorothiazide morning of the test. ?FEMALES- please wear underwire-free bra if available, avoid dresses & tight clothing ? ? ?     ?After the Test: ?Drink plenty of water. ?After receiving IV contrast, you may experience a mild flushed feeling. This is normal. ?On occasion, you may experience a mild rash up to 24 hours after the test. This is not dangerous. If this occurs, you can take Benadryl 25 mg and increase your fluid intake. ?If you experience trouble breathing, this can be serious. If it is severe call 911 IMMEDIATELY. If it is mild, please call our office. ?If you take any of these medications: Glipizide/Metformin, Avandament, Glucavance, please do not take 48 hours after completing test unless otherwise instructed. ? ?For non-scheduling related questions, please contact the cardiac imaging nurse navigator should you have any questions/concerns: ?Marchia Bond, Cardiac Imaging Nurse Navigator ?Gordy Clement, Cardiac Imaging Nurse Navigator ?[facility] ?Direct Office Dial: [phone]  ? ?For scheduling needs, including cancellations and rescheduling, please call [place], [phone]. ? ? ? ?Follow-Up: ?At [facility], you and your health needs are our priority.  As part of our continuing mission to provide you with exceptional heart care, we have created designated Provider Care Teams.  These Care Teams include your primary Cardiologist (physician) and Advanced Practice Providers (APPs -  Physician Assistants and Nurse Practitioners) who all work together to provide you with the care you need, when you need it. ? ? ?Your next appointment:   ?1  month(s) ? ?The format for your next appointment:   ?In Person ? ?Provider:   ?Christell Faith, PA-C  ? ? ? ? ?Important Information About [place] ? ? ? ? ?  ?

## 2021-07-15 ENCOUNTER — Telehealth (HOSPITAL_COMMUNITY): Payer: Self-pay | Admitting: Emergency Medicine

## 2021-07-15 NOTE — Telephone Encounter (Signed)
Reaching out to patient to offer assistance regarding upcoming cardiac imaging study; pt verbalizes understanding of appt date/time, parking situation and where to check in, pre-test NPO status and medications ordered, and verified current allergies; name and call back number provided for further questions should they arise ?Jamie Bond RN Navigator Cardiac Imaging ?[facility] ?[phone] office ?[phone] cell ? ?'100mg'$  metoprolol tartrate ?Arrival 200 ?Denies iv issues ? ?

## 2021-07-16 ENCOUNTER — Ambulatory Visit
Admission: RE | Admit: 2021-07-16 | Discharge: 2021-07-16 | Disposition: A | Discharge status: Home / Self Care | Payer: Medicare Other | Source: Ambulatory Visit | Attending: Physician Assistant | Admitting: Physician Assistant

## 2021-07-16 DIAGNOSIS — R072 Precordial pain: Secondary | ICD-10-CM | POA: Insufficient documentation

## 2021-07-16 MED ORDER — METOPROLOL TARTRATE 5 MG/5ML IV SOLN
10.0000 mg | Freq: Once | INTRAVENOUS | Status: AC
  Administered 2021-07-16: 10 mg via INTRAVENOUS

## 2021-07-16 MED ORDER — NITROGLYCERIN 0.4 MG SL SUBL
0.8000 mg | SUBLINGUAL_TABLET | Freq: Once | SUBLINGUAL | Status: AC
  Administered 2021-07-16: 0.8 mg via SUBLINGUAL

## 2021-07-16 MED ORDER — IOHEXOL 350 MG/ML SOLN
75.0000 mL | Freq: Once | INTRAVENOUS | Status: AC | PRN
  Administered 2021-07-16: 75 mL via INTRAVENOUS

## 2021-07-16 NOTE — Progress Notes (Signed)
Patient tolerated CT well. Drank water after. Vital signs stable encourage to drink water throughout day.Reasons explained and verbalized understanding.  

## 2021-07-17 ENCOUNTER — Telehealth: Payer: Self-pay | Admitting: Physician Assistant

## 2021-07-17 ENCOUNTER — Encounter: Payer: Self-pay | Admitting: Internal Medicine

## 2021-07-17 ENCOUNTER — Ambulatory Visit: Payer: Medicare Other | Admitting: Internal Medicine

## 2021-07-17 VITALS — BP 134/62 | HR 86 | Temp 97.3°F | Ht 64.5 in | Wt 176.6 lb

## 2021-07-17 DIAGNOSIS — D509 Iron deficiency anemia, unspecified: Secondary | ICD-10-CM | POA: Insufficient documentation

## 2021-07-17 DIAGNOSIS — I82461 Acute embolism and thrombosis of right calf muscular vein: Secondary | ICD-10-CM

## 2021-07-17 DIAGNOSIS — I2510 Atherosclerotic heart disease of native coronary artery without angina pectoris: Secondary | ICD-10-CM

## 2021-07-17 DIAGNOSIS — R5383 Other fatigue: Secondary | ICD-10-CM | POA: Diagnosis not present

## 2021-07-17 DIAGNOSIS — E538 Deficiency of other specified B group vitamins: Secondary | ICD-10-CM

## 2021-07-17 DIAGNOSIS — I2584 Coronary atherosclerosis due to calcified coronary lesion: Secondary | ICD-10-CM

## 2021-07-17 DIAGNOSIS — I480 Paroxysmal atrial fibrillation: Secondary | ICD-10-CM

## 2021-07-17 DIAGNOSIS — M4802 Spinal stenosis, cervical region: Secondary | ICD-10-CM

## 2021-07-17 DIAGNOSIS — E785 Hyperlipidemia, unspecified: Secondary | ICD-10-CM | POA: Diagnosis not present

## 2021-07-17 LAB — LIPID PANEL
Cholesterol: 137 mg/dL (ref 0–200)
HDL: 65.6 mg/dL (ref >39.00)
LDL Cholesterol: 50 mg/dL (ref 0–99)
NonHDL: 71.52
Total CHOL/HDL Ratio: 2
Triglycerides: 108 mg/dL (ref 0.0–149.0)
VLDL: 21.6 mg/dL (ref 0.0–40.0)

## 2021-07-17 LAB — HEPATIC FUNCTION PANEL
ALT: 10 U/L (ref 0–35)
AST: 13 U/L (ref 0–37)
Albumin: 4 g/dL (ref 3.5–5.2)
Alkaline Phosphatase: 109 U/L (ref 39–117)
Bilirubin, Direct: 0.1 mg/dL (ref 0.0–0.3)
Total Bilirubin: 0.3 mg/dL (ref 0.2–1.2)
Total Protein: 6.5 g/dL (ref 6.0–8.3)

## 2021-07-17 LAB — B12 AND FOLATE PANEL
Folate: 15.2 ng/mL (ref >5.9)
Vitamin B-12: 194 pg/mL — ABNORMAL LOW (ref 211–911)

## 2021-07-17 NOTE — Telephone Encounter (Signed)
Valora Corporal, RN  ?07/17/2021 10:03 AM EDT   ?  ?Reviewed results and recommendations with patient. She verbalized understanding with no further questions at this time.   ? ?

## 2021-07-17 NOTE — Patient Instructions (Addendum)
Ex Lax ,  Dulcolax Sennakot S or correctol for opioid induced constipation ? ?Stop when you stop the pain meds and iron  ,  and start Miralax or benefiber  ? ?Let the PT know that you have cervical spine stenosis involving C5-C8  ? ?The numbness in your arm is from the cervical spine issues ? ? ?Your heart is "strong, "  but the cardiac CT shows cholesterol in all of your arteries,  so we need to keep your LDL < 70 with medications ? ?Increase your protein intake.  Start eating healthier ? ?Your anemia has improved and almost resolved.  Continue iron for one more month  ? ?We will recheck your hemoglobin in one month ?

## 2021-07-17 NOTE — Assessment & Plan Note (Addendum)
Multi level by 2019 MRI ,  With most marked changes noted from C5-8. Her recent episode of left arm numbess has resolved and may have been aggravated by her PT  ?

## 2021-07-17 NOTE — Assessment & Plan Note (Signed)
Continue Zetia, statin for goal LDL < 70.  Checking today.  ?

## 2021-07-17 NOTE — Progress Notes (Addendum)
? ?Subjective:  ?Patient ID: Jamie Anderson, female    DOB: 07-11-1935  Age: 86 y.o. MRN: [id number] ? ?CC: The primary encounter diagnosis was Iron deficiency anemia, unspecified iron deficiency anemia type. Diagnoses of Acute deep vein thrombosis (DVT) of calf muscle vein of right lower extremity (HCC), Paroxysmal A-fib ([place]), Hyperlipidemia LDL goal <70, Other fatigue, Coronary artery disease due to calcified coronary lesion, Spinal stenosis in cervical region, and B12 deficiency were also pertinent to this visit. ?o ? ? ?HPI ?Jamie Anderson presents for  ?Chief Complaint  ?Patient presents with  ? Follow-up  ? ?Jamie Anderson is an 86 yr old female with aortic atherosclerosis,  CKD  stage 3 and bronchiectasis who recently underwent TKR of right knee on Feb 22 at [facility] that was complicated by . postoperative drop in hemoglobin from 12.4 to 9.6 ,  Paroxysmal atrial fib and RLE DVT . ? ?She has been taking iron since her last visit with me in April for persistent iron deficiency anemia .  Feeling constipated  but also taking hydrocodone for knee pain  ? ?She  was evaluated at [facility] ER for left neck and arm pain preceded by vertigo on  May 5, with EKG, chest x ray and labs,  but left before the ER doctor saw her (after waiting at least 6 hours), so no note was generated.  Troponin was normal , chest x ray non acute.  Hemoglobin 11.4  .  4 ? ?History of Meniere's . She has been having vertigo,   also having headaches  intermittently , but not together.  ? ?She has  subsequently underwent a cardiac Calcium CT yesterday. Had a caffeine withdrawal headache which resolved after nitroglycerin tablet for the test.  Calcium score was high , 71st percentile for age  at 54 ? ?,] ?Lab Results  ?Component Value Date  ? VITAMINB12 194 (L) 07/17/2021  ? ? ? ?Lab Results  ?Component Value Date  ? CHOL 137 07/17/2021  ? HDL 65.60 07/17/2021  ? [place] 50 07/17/2021  ? LDLDIRECT 59.0 01/30/2018  ? TRIG 108.0 07/17/2021  ? CHOLHDL 2 07/17/2021   ? ? ? ? ?Outpatient Medications Prior to Visit  ?Medication Sig Dispense Refill  ? acetaminophen (TYLENOL) 325 MG tablet Take 1-2 tablets (325-650 mg total) by mouth every 6 (six) hours as needed for mild pain (pain score 1-3 or temp > 100.5). 40 tablet 1  ? albuterol (VENTOLIN HFA) 108 (90 Base) MCG/ACT inhaler Inhale 1 puff into the lungs every 6 (six) hours as needed for wheezing or shortness of breath. 1 each 0  ? allopurinol (ZYLOPRIM) 100 MG tablet TAKE 1 TABLET BY MOUTH  DAILY 90 tablet 3  ? apixaban (ELIQUIS) 5 MG TABS tablet Take 1 tablet (5 mg total) by mouth 2 (two) times daily. 60 tablet 4  ? calcium carbonate (TUMS - DOSED IN MG ELEMENTAL CALCIUM) 500 MG chewable tablet Chew 1 tablet by mouth daily as needed for indigestion or heartburn.    ? cetirizine (ZYRTEC) 10 MG tablet Take 10 mg by mouth daily as needed for allergies.    ? clobetasol cream (TEMOVATE) 0.05 % Apply to itchy rash on back 1-2 times a day until improved. Avoid face, groin, axilla. 30 g 1  ? conjugated estrogens (PREMARIN) vaginal cream Place 1 applicator vaginally as needed.    ? ezetimibe (ZETIA) 10 MG tablet TAKE 1 TABLET BY MOUTH  DAILY 90 tablet 3  ? fluocinonide cream (LIDEX) 0.05 % Apply 1  application. topically 2 (two) times daily as needed (itching).    ? fluticasone (FLONASE) 50 MCG/ACT nasal spray Place 2 sprays into both nostrils daily as needed for allergies or rhinitis.    ? fluticasone-salmeterol (ADVAIR) 100-50 MCG/ACT AEPB Inhale 1 puff into the lungs 2 (two) times daily as needed.    ? Iron, Ferrous Sulfate, 325 (65 Fe) MG TABS Take 325 mg by mouth daily. With  food or orange juice 90 tablet 0  ? losartan (COZAAR) 100 MG tablet Take 1 tablet (100 mg total) by mouth daily. 90 tablet 3  ? meclizine (ANTIVERT) 25 MG tablet Take 1 tablet (25 mg total) by mouth 2 (two) times daily. 180 tablet 3  ? metoprolol succinate (TOPROL-XL) 25 MG 24 hr tablet Take 1 tablet (25 mg total) by mouth at bedtime. 90 tablet 3  ?  pantoprazole (PROTONIX) 40 MG tablet TAKE 1 TABLET BY MOUTH  DAILY 90 tablet 3  ? sertraline (ZOLOFT) 100 MG tablet TAKE 1 TABLET BY MOUTH  DAILY 90 tablet 3  ? simvastatin (ZOCOR) 40 MG tablet Take 1 tablet (40 mg total) by mouth at bedtime. 90 tablet 3  ? traMADol (ULTRAM) 50 MG tablet Take 50 mg by mouth every 6 (six) hours as needed.    ? VITAMIN D PO Take 1,000 Int'l Units/day by mouth daily.    ? metoprolol tartrate (LOPRESSOR) 100 MG tablet Take 1 tablet (100 mg total) by mouth once for 1 dose. Take 2 hours prior to test 1 tablet 0  ? ?No facility-administered medications prior to visit.  ? ? ?Review of Systems; ? ?Patient denies headache, fevers, malaise, unintentional weight loss, skin rash, eye pain, sinus congestion and sinus pain, sore throat, dysphagia,  hemoptysis , cough, dyspnea, wheezing, chest pain, palpitations, orthopnea, edema, abdominal pain, nausea, melena, diarrhea, constipation, flank pain, dysuria, hematuria, urinary  Frequency, nocturia, numbness, tingling, seizures,  Focal weakness, Loss of consciousness,  Tremor, insomnia, depression, anxiety, and suicidal ideation.   ? ? ? ?Objective:  ?BP 134/62 (BP Location: Left Arm, Patient Position: Sitting, Cuff Size: Large)   Pulse 86   Temp (!) 97.3 ?F (36.3 ?C) (Oral)   Ht 5' 4.5" (1.638 m)   Wt 176 lb 9.6 oz (80.1 kg)   SpO2 97%   BMI 29.85 kg/m?  ? ?BP Readings from Last 3 Encounters:  ?07/17/21 134/62  ?07/16/21 128/68  ?07/14/21 138/81  ? ? ?Wt Readings from Last 3 Encounters:  ?07/17/21 176 lb 9.6 oz (80.1 kg)  ?07/14/21 177 lb (80.3 kg)  ?06/08/21 180 lb 9.6 oz (81.9 kg)  ? ? ?General appearance: alert, cooperative and appears stated age ?Ears: normal TM's and external ear canals both ears ?Throat: lips, mucosa, and tongue normal; teeth and gums normal ?Neck: no adenopathy, no carotid bruit, supple, symmetrical, trachea midline and thyroid not enlarged, symmetric, no tenderness/mass/nodules ?Back: symmetric, no curvature. ROM  normal. No CVA tenderness. ?Lungs: clear to auscultation bilaterally ?Heart: regular rate and rhythm, S1, S2 normal, no murmur, click, rub or gallop ?Abdomen: soft, non-tender; bowel sounds normal; no masses,  no organomegaly ?Pulses: 2+ and symmetric ?Skin: Skin color, texture, turgor normal. No rashes or lesions ?Lymph nodes: Cervical, supraclavicular, and axillary nodes normal. ? ?Lab Results  ?Component Value Date  ? HGBA1C 5.5 01/30/2018  ? HGBA1C 5.4 01/15/2015  ? ? ?Lab Results  ?Component Value Date  ? CREATININE 0.97 06/08/2021  ? CREATININE 0.91 05/04/2021  ? CREATININE 1.30 (H) 05/03/2021  ? ? ?Lab Results  ?  Component Value Date  ? WBC 5.6 06/08/2021  ? HGB 9.7 (L) 06/08/2021  ? HCT 28.9 (L) 06/08/2021  ? PLT 281.0 06/08/2021  ? GLUCOSE 82 06/08/2021  ? CHOL 137 07/17/2021  ? TRIG 108.0 07/17/2021  ? HDL 65.60 07/17/2021  ? LDLDIRECT 59.0 01/30/2018  ? [place] 50 07/17/2021  ? ALT 10 07/17/2021  ? AST 13 07/17/2021  ? NA 141 06/08/2021  ? K 4.3 06/08/2021  ? CL 106 06/08/2021  ? CREATININE 0.97 06/08/2021  ? BUN 14 06/08/2021  ? CO2 27 06/08/2021  ? TSH 0.947 05/03/2021  ? INR 0.9 05/29/2012  ? HGBA1C 5.5 01/30/2018  ? MICROALBUR <0.7 04/20/2016  ? ? ?CT CORONARY MORPH W/CTA COR W/SCORE W/CA W/CM &/OR WO/CM ? ?Addendum Date: 07/16/2021   ?ADDENDUM REPORT: 07/16/2021 16:53 EXAM: OVER-READ INTERPRETATION  CT CHEST The following report is a limited chest CT over-read performed by radiologist Dr. Abigail Miyamoto of [facility] [place] on 07/16/2021. This over-read does not include interpretation of cardiac or coronary anatomy or pathology. The coronary CTA interpretation by the cardiologist is attached. COMPARISON:  05/03/2021 CTA chest FINDINGS: Vascular: Aortic atherosclerosis. No central pulmonary embolism, on this non-dedicated study. Mediastinum/Nodes: No imaged thoracic adenopathy. No imaged thoracic adenopathy. Lungs/Pleura: No pleural fluid. 3 mm left upper lobe pulmonary nodule on 09/09 is similar to  on the prior exam and present on 01/18/2017, considered benign. Upper Abdomen: Normal imaged portions of the liver, spleen, stomach Musculoskeletal: Thoracic spondylosis. IMPRESSION: No acute findings in the imaged extraca

## 2021-07-17 NOTE — Telephone Encounter (Signed)
Jamie Mu, PA-C  ?07/16/2021  5:01 PM EDT   ?  ?Noncardiac over read: ?No acute findings with noted aortic atherosclerosis as well as a stable 3 mm left upper lobe pulmonary nodule that is considered benign ?  ?Coronary over read: ?Calcium score 398 which is the 71st percentile ?Less than 25% narrowing in the left main ?25 to 49% narrowing in the proximal LAD ?Less than 25% narrowing in the proximal LCx ?Aortic atherosclerosis ?  ?Recommendations: ?-No evidence of obstructive CAD with recommendation to continue preventative therapy and risk factor modification ?-LDL is well controlled and followed by PCP ?-No further blockage testing is indicated  ? ?

## 2021-07-17 NOTE — Telephone Encounter (Signed)
Attempted to contact the patient at her preferred #. ?No answer- no voice mail after multiple rings.  ?  ?Will attempt to call back at a later time.  ?

## 2021-07-17 NOTE — Assessment & Plan Note (Signed)
Occurring post operatively and in the setting of PAF as well.  Continued use of Eliquis (for GFR < 60) for a minimum of 3 months.  Given the episode of PAF, will reassess rhythm prior to d/c Eliquis   ?

## 2021-07-17 NOTE — Assessment & Plan Note (Addendum)
Occurred Spontaneously post operatively in the setting of post operative anemia,  orthostasis  and resolved spontaneously.  unclear if this was secondary to  anesthesia,  Postoperative anemia  Or other causes.  Since then she has had an ECHO  Scheduled but not done yet   She remains in  sinus rhythm today.Marland Kitchen  Rhythm will need to be  assessed again prior to the suspension of Eliquis.   ?

## 2021-07-17 NOTE — Assessment & Plan Note (Signed)
Occurring post operatively from TKR in Feb 2023.  Improved by last hgb done during [facility] ER visit May 5  (11.4)  Continue iron for one more month ?

## 2021-07-19 DIAGNOSIS — E538 Deficiency of other specified B group vitamins: Secondary | ICD-10-CM | POA: Insufficient documentation

## 2021-07-19 NOTE — Assessment & Plan Note (Signed)
Checking MMA and IF Ab ?

## 2021-07-19 NOTE — Addendum Note (Signed)
Addended by: Crecencio Mc on: 07/19/2021 09:59 PM ? ? Modules accepted: Orders ? ?

## 2021-07-20 ENCOUNTER — Telehealth: Payer: Self-pay

## 2021-07-20 DIAGNOSIS — R531 Weakness: Secondary | ICD-10-CM | POA: Diagnosis not present

## 2021-07-20 DIAGNOSIS — H8110 Benign paroxysmal vertigo, unspecified ear: Secondary | ICD-10-CM | POA: Diagnosis not present

## 2021-07-20 DIAGNOSIS — R5383 Other fatigue: Secondary | ICD-10-CM | POA: Diagnosis not present

## 2021-07-20 MED ORDER — PREDNISONE 10 MG PO TABS: ORAL_TABLET | ORAL | 0 refills | Status: DC

## 2021-07-20 NOTE — Telephone Encounter (Signed)
Patient's daughter, Valetta Fuller, called to state patient was in to see Dr. Derrel Nip on Friday and had labs drawn.  Pam states patient woke up on Saturday with vertigo and no energy.  Pam states patient also had nausea and headache on Saturday.  Pam states patient is extremely weak and struggles to get up and use her walker.  Pam states she brought patient to her house since she cannot be alone.  Pam states she saw on MyChart that patient's lab results show her B-12 is low.  Pam states patient is on Meclizine, '25mg'$  two times per day and patient's vertigo has gotten better over the weekend, but she is still having it.  Pam states she wonders if patient needs a B-12 shot or an increase in the Meclizine. ? ?Transferred call to Access Nurse. ?

## 2021-07-20 NOTE — Telephone Encounter (Signed)
Access Nurse called in to office line.... Access Nurse Lattie Haw stated that pt needs to be seen within 4 hours... Advised access nurse that there are no avail appt.... Access Nurse stated that pt doesn't want to go anywhere else but to see her doctor... Advised Access Nurse that Pt may have to go to Urgent care or ED.Jamie KitchenMarland Anderson   ?

## 2021-07-20 NOTE — Telephone Encounter (Signed)
LMTCB with pt's daughter. Need to let daughter know that Dr. Derrel Nip has sent in a prednisone taper that she needs to get started on today.  ?

## 2021-07-20 NOTE — Telephone Encounter (Signed)
Pt refuses ED and UC. Pt has been scheduled for tomorrow at 10:45.  ?

## 2021-07-20 NOTE — Telephone Encounter (Signed)
Patient's daughter called back and note was read. She would like Janett Billow to call her, she would like to know why her mother needs to start on a prednisone taper. Daughter also stated that she had mother to Urgent Care, no UTI. She still wants to keep appointment for tomorrow because patient needs a B12. ?

## 2021-07-20 NOTE — Telephone Encounter (Signed)
Spoke with pt's daughter to explain what the prednisone taper was prescribed for. Daughter stated that she did convince her mother to go to UC. They stated that she does not have a sinus infection and neg for a UTI. Daughter would like to still keep the appt tomorrow to discuss the pt's b12.  ?

## 2021-07-21 ENCOUNTER — Encounter: Payer: Self-pay | Admitting: Internal Medicine

## 2021-07-21 ENCOUNTER — Ambulatory Visit (INDEPENDENT_AMBULATORY_CARE_PROVIDER_SITE_OTHER): Payer: Medicare Other | Admitting: Internal Medicine

## 2021-07-21 VITALS — BP 128/70 | HR 88 | Temp 98.0°F | Ht 64.5 in | Wt 176.6 lb

## 2021-07-21 DIAGNOSIS — R42 Dizziness and giddiness: Secondary | ICD-10-CM | POA: Insufficient documentation

## 2021-07-21 DIAGNOSIS — M4802 Spinal stenosis, cervical region: Secondary | ICD-10-CM

## 2021-07-21 DIAGNOSIS — H8109 Meniere's disease, unspecified ear: Secondary | ICD-10-CM | POA: Insufficient documentation

## 2021-07-21 DIAGNOSIS — E538 Deficiency of other specified B group vitamins: Secondary | ICD-10-CM

## 2021-07-21 MED ORDER — CYANOCOBALAMIN 1000 MCG/ML IJ SOLN
1000.0000 ug | Freq: Once | INTRAMUSCULAR | Status: AC
  Administered 2021-07-21: 1000 ug via INTRAMUSCULAR

## 2021-07-21 NOTE — Progress Notes (Signed)
? ?Subjective:  ?Patient ID: Jamie Anderson, female    DOB: 25-Sep-1935  Age: 86 y.o. MRN: [id number] ? ?CC: The primary encounter diagnosis was B12 deficiency. Diagnoses of Spinal stenosis in cervical region and Vertigo were also pertinent to this visit. ? ? ?HPI ?Jamie Anderson presents for  ?Chief Complaint  ?Patient presents with  ? Acute Visit  ?  Saturday morning pt woke up very dizzy, fatigued and weak. Symptoms started to improve on Sunday but still had dizziness when moving her head to fast. Pt started on the prednisone yesterday and has seen improvement with the fatigue, weakness and dizziness.   ? ? ?She is an 86 yr old female with recent diagnosis of atrial fibrillation occurring post operatively from knee replacement in February, on chronic anticoagulation presents with Vertigo which started on Saturday, accompanied by feeling weak.  Symptoms were aggravated by positioning during  PT when she was lying down.  Aggravated by turning head,  but not when standing up . She recalls that Had a mild headache on Saturday and Sunday . Started prednisone yesterday , symptoms are already improving.  ? ?She has a history of vertigo treated remotely with Epley Maneuver  ? ?She complains of numbness and tingling of left arm, which is chronic .  She has a history of cervical spinal stenosis  ? ? ?Outpatient Medications Prior to Visit  ?Medication Sig Dispense Refill  ? acetaminophen (TYLENOL) 325 MG tablet Take 1-2 tablets (325-650 mg total) by mouth every 6 (six) hours as needed for mild pain (pain score 1-3 or temp > 100.5). 40 tablet 1  ? albuterol (VENTOLIN HFA) 108 (90 Base) MCG/ACT inhaler Inhale 1 puff into the lungs every 6 (six) hours as needed for wheezing or shortness of breath. 1 each 0  ? allopurinol (ZYLOPRIM) 100 MG tablet TAKE 1 TABLET BY MOUTH  DAILY 90 tablet 3  ? apixaban (ELIQUIS) 5 MG TABS tablet Take 1 tablet (5 mg total) by mouth 2 (two) times daily. 60 tablet 4  ? calcium carbonate (TUMS - DOSED  IN MG ELEMENTAL CALCIUM) 500 MG chewable tablet Chew 1 tablet by mouth daily as needed for indigestion or heartburn.    ? cetirizine (ZYRTEC) 10 MG tablet Take 10 mg by mouth daily as needed for allergies.    ? clobetasol cream (TEMOVATE) 0.05 % Apply to itchy rash on back 1-2 times a day until improved. Avoid face, groin, axilla. 30 g 1  ? conjugated estrogens (PREMARIN) vaginal cream Place 1 applicator vaginally as needed.    ? ezetimibe (ZETIA) 10 MG tablet TAKE 1 TABLET BY MOUTH  DAILY 90 tablet 3  ? fluocinonide cream (LIDEX) 2.83 % Apply 1 application. topically 2 (two) times daily as needed (itching).    ? fluticasone (FLONASE) 50 MCG/ACT nasal spray Place 2 sprays into both nostrils daily as needed for allergies or rhinitis.    ? fluticasone-salmeterol (ADVAIR) 100-50 MCG/ACT AEPB Inhale 1 puff into the lungs 2 (two) times daily as needed.    ? Iron, Ferrous Sulfate, 325 (65 Fe) MG TABS Take 325 mg by mouth daily. With  food or orange juice 90 tablet 0  ? losartan (COZAAR) 100 MG tablet Take 1 tablet (100 mg total) by mouth daily. 90 tablet 3  ? meclizine (ANTIVERT) 25 MG tablet Take 1 tablet (25 mg total) by mouth 2 (two) times daily. 180 tablet 3  ? metoprolol succinate (TOPROL-XL) 25 MG 24 hr tablet Take 1 tablet (25 mg  total) by mouth at bedtime. 90 tablet 3  ? pantoprazole (PROTONIX) 40 MG tablet TAKE 1 TABLET BY MOUTH  DAILY 90 tablet 3  ? predniSONE (DELTASONE) 10 MG tablet 6 tablets on Day 1 , then reduce by 1 tablet daily until gone 21 tablet 0  ? sertraline (ZOLOFT) 100 MG tablet TAKE 1 TABLET BY MOUTH  DAILY 90 tablet 3  ? simvastatin (ZOCOR) 40 MG tablet Take 1 tablet (40 mg total) by mouth at bedtime. 90 tablet 3  ? traMADol (ULTRAM) 50 MG tablet Take 50 mg by mouth every 6 (six) hours as needed.    ? VITAMIN D PO Take 1,000 Int'l Units/day by mouth daily.    ? calcitRIOL (ROCALTROL) 0.25 MCG capsule Take 0.25 mcg by mouth daily.    ? metoprolol tartrate (LOPRESSOR) 100 MG tablet Take 1 tablet  (100 mg total) by mouth once for 1 dose. Take 2 hours prior to test 1 tablet 0  ? ?No facility-administered medications prior to visit.  ? ? ?Review of Systems; ? ?Patient denies headache, fevers, malaise, unintentional weight loss, skin rash, eye pain, sinus congestion and sinus pain, sore throat, dysphagia,  hemoptysis , cough, dyspnea, wheezing, chest pain, palpitations, orthopnea, edema, abdominal pain, nausea, melena, diarrhea, constipation, flank pain, dysuria, hematuria, urinary  Frequency, nocturia, numbness, tingling, seizures,  Focal weakness, Loss of consciousness,  Tremor, insomnia, depression, anxiety, and suicidal ideation.   ? ? ? ?Objective:  ?BP 128/70 (BP Location: Left Arm, Patient Position: Sitting, Cuff Size: Large)   Pulse 88   Temp 98 ?F (36.7 ?C) (Oral)   Ht 5' 4.5" (1.638 m)   Wt 176 lb 9.6 oz (80.1 kg)   SpO2 98%   BMI 29.85 kg/m?  ? ?BP Readings from Last 3 Encounters:  ?07/21/21 128/70  ?07/17/21 134/62  ?07/16/21 128/68  ? ? ?Wt Readings from Last 3 Encounters:  ?07/21/21 176 lb 9.6 oz (80.1 kg)  ?07/17/21 176 lb 9.6 oz (80.1 kg)  ?07/14/21 177 lb (80.3 kg)  ? ? ?General appearance: alert, cooperative and appears stated age ?Ears: normal TM's and external ear canals both ears ?Throat: lips, mucosa, and tongue normal; teeth and gums normal ?Neck: no adenopathy, no carotid bruit, supple, symmetrical, trachea midline and thyroid not enlarged, symmetric, no tenderness/mass/nodules ?Back: symmetric, no curvature. ROM normal. No CVA tenderness. ?Lungs: clear to auscultation bilaterally ?Heart: regular rate and rhythm, S1, S2 normal, no murmur, click, rub or gallop ?Abdomen: soft, non-tender; bowel sounds normal; no masses,  no organomegaly ?Pulses: 2+ and symmetric ?Skin: Skin color, texture, turgor normal. No rashes or lesions ?Lymph nodes: Cervical, supraclavicular, and axillary nodes normal. ? ?Lab Results  ?Component Value Date  ? HGBA1C 5.5 01/30/2018  ? HGBA1C 5.4 01/15/2015   ? ? ?Lab Results  ?Component Value Date  ? CREATININE 0.97 06/08/2021  ? CREATININE 0.91 05/04/2021  ? CREATININE 1.30 (H) 05/03/2021  ? ? ?Lab Results  ?Component Value Date  ? WBC 5.6 06/08/2021  ? HGB 9.7 (L) 06/08/2021  ? HCT 28.9 (L) 06/08/2021  ? PLT 281.0 06/08/2021  ? GLUCOSE 82 06/08/2021  ? CHOL 137 07/17/2021  ? TRIG 108.0 07/17/2021  ? HDL 65.60 07/17/2021  ? LDLDIRECT 59.0 01/30/2018  ? [place]elle 50 07/17/2021  ? ALT 10 07/17/2021  ? AST 13 07/17/2021  ? NA 141 06/08/2021  ? K 4.3 06/08/2021  ? CL 106 06/08/2021  ? CREATININE 0.97 06/08/2021  ? BUN 14 06/08/2021  ? CO2 27 06/08/2021  ?  TSH 0.947 05/03/2021  ? INR 0.9 05/29/2012  ? HGBA1C 5.5 01/30/2018  ? MICROALBUR <0.7 04/20/2016  ? ? ?CT CORONARY MORPH W/CTA COR W/SCORE W/CA W/CM &/OR WO/CM ? ?Addendum Date: 07/16/2021   ?ADDENDUM REPORT: 07/16/2021 16:53 EXAM: OVER-READ INTERPRETATION  CT CHEST The following report is a limited chest CT over-read performed by radiologist Dr. Abigail Miyamoto of [facility], [place] on 07/16/2021. This over-read does not include interpretation of cardiac or coronary anatomy or pathology. The coronary CTA interpretation by the cardiologist is attached. COMPARISON:  05/03/2021 CTA chest FINDINGS: Vascular: Aortic atherosclerosis. No central pulmonary embolism, on this non-dedicated study. Mediastinum/Nodes: No imaged thoracic adenopathy. No imaged thoracic adenopathy. Lungs/Pleura: No pleural fluid. 3 mm left upper lobe pulmonary nodule on 09/09 is similar to on the prior exam and present on 01/18/2017, considered benign. Upper Abdomen: Normal imaged portions of the liver, spleen, stomach Musculoskeletal: Thoracic spondylosis. IMPRESSION: No acute findings in the imaged extracardiac chest. Aortic Atherosclerosis (ICD10-I70.0). Electronically Signed   By: Abigail Miyamoto M.D.   On: 07/16/2021 16:53  ? ?Result Date: 07/16/2021 ?CLINICAL DATA:  Precordial pain EXAM: Cardiac/Coronary  CTA TECHNIQUE: The patient was scanned on a  [employer] go.Top scanner. : A retrospective scan was triggered in the descending thoracic aorta. Axial non-contrast 3 mm slices were carried out through the heart. The data set was analyzed on a dedicated work

## 2021-07-21 NOTE — Assessment & Plan Note (Signed)
Neurologic exam is without nystagmus and symptoms have improved overnight with initiation of prednisone taper.  Continue current prescription. Refer to ENT of symptoms do not resolve. ?

## 2021-07-21 NOTE — Assessment & Plan Note (Signed)
Multi level by 2019 MRI ,  With most marked changes noted from C5-8.She has recurrent episodes of left arm numbess  That are likely aggravated by her PT ?

## 2021-07-21 NOTE — Patient Instructions (Signed)
I am treating you with prednisone which helps when the vertigo is due to inflammation of the eustachian tubes ? ?Finish the prednisone taper ? ?If symptoms do not resolve,  will make ENT referral ? ?If You develop a severe headache.  Go directly to ER  ? ? ?

## 2021-07-23 ENCOUNTER — Telehealth: Payer: Self-pay | Admitting: Internal Medicine

## 2021-07-23 MED ORDER — CYANOCOBALAMIN 1000 MCG/ML IJ SOLN: 1000.0000 ug | INTRAMUSCULAR | 1 refills | Status: DC

## 2021-07-23 NOTE — Telephone Encounter (Signed)
Last 2 weekly injections has been sent to pharmacy.

## 2021-07-23 NOTE — Telephone Encounter (Signed)
Patient's daughter called and would like patient's last 2 B12 injections to be sent to patient's pharmacy. Patient's other daughter is a Marine scientist. Pharmacy is [facility] in [place].

## 2021-07-24 DIAGNOSIS — M1711 Unilateral primary osteoarthritis, right knee: Secondary | ICD-10-CM | POA: Diagnosis not present

## 2021-07-28 ENCOUNTER — Ambulatory Visit: Payer: Medicare Other

## 2021-07-28 DIAGNOSIS — M1711 Unilateral primary osteoarthritis, right knee: Secondary | ICD-10-CM | POA: Diagnosis not present

## 2021-07-31 DIAGNOSIS — M1711 Unilateral primary osteoarthritis, right knee: Secondary | ICD-10-CM | POA: Diagnosis not present

## 2021-08-03 DIAGNOSIS — M1711 Unilateral primary osteoarthritis, right knee: Secondary | ICD-10-CM | POA: Diagnosis not present

## 2021-08-04 ENCOUNTER — Ambulatory Visit: Payer: Medicare Other | Admitting: Internal Medicine

## 2021-08-04 ENCOUNTER — Ambulatory Visit: Payer: Medicare Other

## 2021-08-05 DIAGNOSIS — M1711 Unilateral primary osteoarthritis, right knee: Secondary | ICD-10-CM | POA: Diagnosis not present

## 2021-08-10 ENCOUNTER — Telehealth: Payer: Self-pay | Admitting: Internal Medicine

## 2021-08-10 DIAGNOSIS — R42 Dizziness and giddiness: Secondary | ICD-10-CM

## 2021-08-10 DIAGNOSIS — M1711 Unilateral primary osteoarthritis, right knee: Secondary | ICD-10-CM | POA: Diagnosis not present

## 2021-08-10 NOTE — Telephone Encounter (Signed)
Pt called stating she would like to be referred to a ear, nose and throat for dizziness.

## 2021-08-11 ENCOUNTER — Other Ambulatory Visit: Payer: Medicare Other

## 2021-08-11 NOTE — Telephone Encounter (Signed)
Spoke with pt and she stated that she is still having some spells of vertigo. She would like to see if we can refer her to ENT.   I have pended the referral for approval.

## 2021-08-12 ENCOUNTER — Telehealth: Payer: Self-pay | Admitting: Cardiovascular Disease

## 2021-08-12 DIAGNOSIS — M1711 Unilateral primary osteoarthritis, right knee: Secondary | ICD-10-CM | POA: Diagnosis not present

## 2021-08-12 NOTE — Telephone Encounter (Signed)
Spoke with patients daughter and she reports her mother is continuing to have episodes of weakness, lightheadedness, and fatigue. Inquired if there have been any medication changes recently and if she was staying hydrated. Requested any recent blood pressure numbers and she stated they have been good at 140's but does not have a list. In review of her chart it appears she has been diagnosed with vertigo which has been noted in several visits. Encouraged for her to have patient take her meclozine and make sure she hydrates well. Confirmed her upcoming appointment and advised I would send this message over to providers for review and if any recommendations we would call back but if no changes then we would see her at appointment to determine any further needs or testing.

## 2021-08-12 NOTE — Telephone Encounter (Signed)
STAT if patient feels like he/she is going to faint   Are you dizzy now?  Patient's daughter   Do you feel faint or have you passed out?  No   Do you have any other symptoms?  Fatigue, low energy  Have you checked your HR and BP (record if available)?  Patient's daughter states her systolic BP has been around 145. No log available

## 2021-08-12 NOTE — Telephone Encounter (Signed)
Spoke with pt and she stated that she has an appt scheduled already.

## 2021-08-14 DIAGNOSIS — H43813 Vitreous degeneration, bilateral: Secondary | ICD-10-CM | POA: Diagnosis not present

## 2021-08-18 DIAGNOSIS — M1711 Unilateral primary osteoarthritis, right knee: Secondary | ICD-10-CM | POA: Diagnosis not present

## 2021-08-20 DIAGNOSIS — M1711 Unilateral primary osteoarthritis, right knee: Secondary | ICD-10-CM | POA: Diagnosis not present

## 2021-08-24 ENCOUNTER — Ambulatory Visit (INDEPENDENT_AMBULATORY_CARE_PROVIDER_SITE_OTHER): Payer: Medicare Other

## 2021-08-24 ENCOUNTER — Ambulatory Visit: Payer: Medicare Other | Admitting: Cardiovascular Disease

## 2021-08-24 VITALS — Ht 64.5 in | Wt 176.0 lb

## 2021-08-24 DIAGNOSIS — Z0000 Encounter for general adult medical examination without abnormal findings: Secondary | ICD-10-CM | POA: Diagnosis not present

## 2021-08-24 NOTE — Patient Instructions (Addendum)
  Ms. Weigel , Thank you for taking time to come for your Medicare Wellness Visit. I appreciate your ongoing commitment to your health goals. Please review the following plan we discussed and let me know if I can assist you in the future.   These are the goals we discussed:  Goals       Patient Stated     Healthy Lifestyle (pt-stated)      Walk more for exercise Monitor diet better        This is a list of the screening recommended for you and due dates:  Health Maintenance  Topic Date Due   COVID-19 Vaccine (5 - Booster for Pfizer series) 09/09/2021*   Zoster (Shingles) Vaccine (1 of 2) 11/24/2021*   Flu Shot  10/06/2021   Tetanus Vaccine  12/07/2023   Pneumonia Vaccine  Completed   DEXA scan (bone density measurement)  Completed   HPV Vaccine  Aged Out  *Topic was postponed. The date shown is not the original due date.

## 2021-08-24 NOTE — Progress Notes (Addendum)
Subjective:   Jamie Anderson is a 86 y.o. female who presents for Medicare Annual (Subsequent) preventive examination.  Review of Systems    No ROS.  Medicare Wellness Virtual Visit.  Visual/audio telehealth visit, UTA vital signs.   See social history for additional risk factors.   Cardiac Risk Factors include: advanced age (>42men, >13 women)     Objective:    Today's Vitals   08/24/21 1539  Weight: 176 lb (79.8 kg)  Height: 5' 4.5" (1.638 m)   Body mass index is 29.74 kg/m.     08/24/2021    3:58 PM 04/29/2021    1:23 PM 04/16/2021   12:49 PM 08/22/2020    4:01 PM 08/22/2019    1:54 PM 08/17/2018   12:37 PM 03/15/2018    2:51 PM  Advanced Directives  Does Patient Have a Medical Advance Directive? Yes Yes Yes Yes Yes Yes Yes  Type of Estate agent of [place];Living will Healthcare Power of [place];Living will Healthcare Power of [place];Living will Healthcare Power of [place];Living will Healthcare Power of [place];Living will Living will;Healthcare Power of Attorney Living will;Healthcare Power of Attorney  Does patient want to make changes to medical advance directive? No - Patient declined No - Patient declined No - Patient declined No - Patient declined No - Patient declined No - Patient declined No - Patient declined  Copy of Healthcare Power of Attorney in Chart? No - copy requested No - copy requested No - copy requested No - copy requested No - copy requested No - copy requested No - copy requested    Current Medications (verified) Outpatient Encounter Medications as of 08/24/2021  Medication Sig   acetaminophen (TYLENOL) 325 MG tablet Take 1-2 tablets (325-650 mg total) by mouth every 6 (six) hours as needed for mild pain (pain score 1-3 or temp > 100.5).   albuterol (VENTOLIN HFA) 108 (90 Base) MCG/ACT inhaler Inhale 1 puff into the lungs every 6 (six) hours as needed for wheezing or shortness of breath.   allopurinol (ZYLOPRIM) 100 MG tablet  TAKE 1 TABLET BY MOUTH  DAILY   apixaban (ELIQUIS) 5 MG TABS tablet Take 1 tablet (5 mg total) by mouth 2 (two) times daily.   calcitRIOL (ROCALTROL) 0.25 MCG capsule Take 0.25 mcg by mouth daily.   calcium carbonate (TUMS - DOSED IN MG ELEMENTAL CALCIUM) 500 MG chewable tablet Chew 1 tablet by mouth daily as needed for indigestion or heartburn.   cetirizine (ZYRTEC) 10 MG tablet Take 10 mg by mouth daily as needed for allergies.   clobetasol cream (TEMOVATE) 0.05 % Apply to itchy rash on back 1-2 times a day until improved. Avoid face, groin, axilla.   conjugated estrogens (PREMARIN) vaginal cream Place 1 applicator vaginally as needed.   cyanocobalamin (,VITAMIN B-12,) 1000 MCG/ML injection Inject 1 mL (1,000 mcg total) into the muscle every 14 (fourteen) days.   ezetimibe (ZETIA) 10 MG tablet TAKE 1 TABLET BY MOUTH  DAILY   fluocinonide cream (LIDEX) 0.05 % Apply 1 application. topically 2 (two) times daily as needed (itching).   fluticasone (FLONASE) 50 MCG/ACT nasal spray Place 2 sprays into both nostrils daily as needed for allergies or rhinitis.   fluticasone-salmeterol (ADVAIR) 100-50 MCG/ACT AEPB Inhale 1 puff into the lungs 2 (two) times daily as needed.   HYDROcodone-acetaminophen (NORCO/VICODIN) 5-325 MG tablet    Iron, Ferrous Sulfate, 325 (65 Fe) MG TABS Take 325 mg by mouth daily. With  food or orange juice  losartan (COZAAR) 100 MG tablet Take 1 tablet (100 mg total) by mouth daily.   meclizine (ANTIVERT) 25 MG tablet Take 1 tablet (25 mg total) by mouth 2 (two) times daily.   metoprolol succinate (TOPROL-XL) 25 MG 24 hr tablet Take 1 tablet (25 mg total) by mouth at bedtime.   metoprolol tartrate (LOPRESSOR) 100 MG tablet Take 1 tablet (100 mg total) by mouth once for 1 dose. Take 2 hours prior to test   pantoprazole (PROTONIX) 40 MG tablet TAKE 1 TABLET BY MOUTH  DAILY   predniSONE (DELTASONE) 10 MG tablet 6 tablets on Day 1 , then reduce by 1 tablet daily until gone    sertraline (ZOLOFT) 100 MG tablet TAKE 1 TABLET BY MOUTH  DAILY   simvastatin (ZOCOR) 40 MG tablet Take 1 tablet (40 mg total) by mouth at bedtime.   traMADol (ULTRAM) 50 MG tablet Take 50 mg by mouth every 6 (six) hours as needed.   VITAMIN D PO Take 1,000 Int'l Units/day by mouth daily.   No facility-administered encounter medications on file as of 08/24/2021.    Allergies (verified) Clarithromycin, Colloidal oatmeal, Dust mite extract, Oat, Other, and Shrimp [shellfish allergy]   History: Past Medical History:  Diagnosis Date   Arthritis    Asthma    Basal cell carcinoma 01/17/2018   Left chest. Infiltrative. Excised 02/06/2018, margins free.   Cystic teratoma of right ovary 11/23/2012   She underwent right salpingo-oophorectomy on April 1 by Dr. Tiburcio Pea for removal of what turned out to be a cystic teratoma. Hysteroscopy was also done at the time due to postmenopausal bleeding. It is unclear  why the left ovary was not taken out since she is postmenopausal.    Dyspnea    GERD (gastroesophageal reflux disease)    Granuloma annulare    Hyperlipidemia    Hypertension    Menopause    Plantar fasciitis of right foot 12/24/2014   Right knee DJD 04/03/2012   SCC (squamous cell carcinoma) 10/08/2020   R mid lower pretibia, EDC   SCC (squamous cell carcinoma) 03/16/2021   R upper pretibial, needs EDC pt sched 06/02/21   Squamous cell carcinoma of skin 02/26/2020   R lower pretibial, clear with bx 04/01/20   Squamous cell carcinoma of skin 07/15/2020   R mid pretibia, EDC   Squamous cell carcinoma of skin 12/09/2020   R lateral pretibia, EDC   Stage 3b chronic kidney disease (CKD) (HCC)    Past Surgical History:  Procedure Laterality Date   APPENDECTOMY     CATARACT EXTRACTION, BILATERAL     DILATION AND CURETTAGE OF UTERUS     RIGHT OOPHORECTOMY Right 04/08/2012   [name]   SPINE SURGERY  03/08/2008   TOTAL KNEE ARTHROPLASTY Right 04/29/2021   Procedure: TOTAL KNEE ARTHROPLASTY;   Surgeon: Lyndle Herrlich, MD;  Location: [facility] ORS;  Service: Orthopedics;  Laterality: Right;   VENOUS ABLATION     bilateral   Family History  Problem Relation Age of Onset   Early death Mother        durig childbirth   Heart disease Father    Heart attack Father    Breast cancer Sister 61       half sister   Heart disease Sister    Cancer Brother    Heart disease Brother        Afib   Social History   Socioeconomic History   Marital status: Widowed    Spouse name: Not  on file   Number of children: Not on file   Years of education: Not on file   Highest education level: Not on file  Occupational History   Not on file  Tobacco Use   Smoking status: Never   Smokeless tobacco: Never  Vaping Use   Vaping Use: Never used  Substance and Sexual Activity   Alcohol use: No    Alcohol/week: 4.0 standard drinks of alcohol    Types: 4 Glasses of wine per week   Drug use: No   Sexual activity: Not Currently  Other Topics Concern   Not on file  Social History Narrative   Not on file   Social Determinants of Health   Financial Resource Strain: Low Risk  (08/24/2021)   Overall Financial Resource Strain (CARDIA)    Difficulty of Paying Living Expenses: Not hard at all  Food Insecurity: No Food Insecurity (08/24/2021)   Hunger Vital Sign    Worried About Running Out of Food in the Last Year: Never true    Ran Out of Food in the Last Year: Never true  Transportation Needs: No Transportation Needs (08/24/2021)   PRAPARE - Administrator, Civil Service (Medical): No    Lack of Transportation (Non-Medical): No  Physical Activity: Unknown (08/22/2020)   Exercise Vital Sign    Days of Exercise per Week: 0 days    Minutes of Exercise per Session: Not on file  Stress: No Stress Concern Present (08/24/2021)   [name] of Occupational Health - Occupational Stress Questionnaire    Feeling of Stress : Not at all  Social Connections: Unknown (08/24/2021)   Social  Connection and Isolation Panel [NHANES]    Frequency of Communication with Friends and Family: More than three times a week    Frequency of Social Gatherings with Friends and Family: More than three times a week    Attends Religious Services: Not on Marketing executive or Organizations: Not on file    Attends Banker Meetings: Not on file    Marital Status: Not on file    Tobacco Counseling Counseling given: Not Answered   Clinical Intake:  Pre-visit preparation completed: Yes        Diabetes: No  How often do you need to have someone help you when you read instructions, pamphlets, or other written materials from your doctor or pharmacy?: 1 - Never  Interpreter Needed?: No    Activities of Daily Living    08/24/2021    3:52 PM 04/29/2021    4:48 PM  In your present state of health, do you have any difficulty performing the following activities:  Hearing? 0 0  Vision? 0 0  Difficulty concentrating or making decisions? 0 0  Walking or climbing stairs? 1 1  Comment Chronic knee pain. Cane/walker in use.   Dressing or bathing? 0 0  Doing errands, shopping? 0 0  Preparing Food and eating ? N   Using the Toilet? N   In the past six months, have you accidently leaked urine? N   Do you have problems with loss of bowel control? N   Managing your Medications? N   Managing your Finances? N   Housekeeping or managing your Housekeeping? N   Comment Maid assist as needed.     Patient Care Team: Sherlene Shams, MD as PCP - General (Internal Medicine) Antonieta Iba, MD as PCP - Cardiology (Cardiology)  Indicate any recent Medical Services  you may have received from other than [facility] providers in the past year (date may be approximate).     Assessment:   This is a routine wellness examination for [place].  Virtual Visit via Telephone Note  I connected with  [name] on 08/24/21 at  3:30 PM EDT by telephone and verified that I am speaking with  the correct person using two identifiers.  Persons participating in the virtual visit: patient/Nurse Health Advisor   I discussed the limitations of performing an evaluation and management service by telehealth. We continued and completed visit with audio only. Some vital signs may be absent or patient reported.   Hearing/Vision screen Hearing Screening - Comments:: Patient is able to hear conversational tones without difficulty. No issues reported.  Vision Screening - Comments:: Followed by [facility] Wears corrective lenses  Cataract extraction, bilateral They have regular follow up with the ophthalmologist  Dietary issues and exercise activities discussed: Current Exercise Habits: Home exercise routine, Time (Minutes): 60, Frequency (Times/Week): 2, Weekly Exercise (Minutes/Week): 120, Intensity: Mild Healthy diet Good water intake   Goals Addressed               This Visit's Progress     Patient Stated     Healthy Lifestyle (pt-stated)   On track     Walk more for exercise Monitor diet better       Depression Screen    08/24/2021    3:46 PM 07/17/2021   11:20 AM 06/08/2021    3:19 PM 02/02/2021    4:21 PM 08/22/2020    3:59 PM 08/18/2020    2:47 PM 07/29/2020    2:54 PM  PHQ 2/9 Scores  PHQ - 2 Score 0 0 4 0 0 0 0  PHQ- 9 Score 0 0 12 1 0  0    Fall Risk    08/24/2021    3:59 PM 07/17/2021   11:20 AM 06/08/2021    3:19 PM 02/02/2021    3:11 PM 08/26/2020   12:40 PM  Fall Risk   Falls in the past year? 0 0 0 1 0  Number falls in past yr: 0   1   Injury with Fall?    0   Risk for fall due to :  No Fall Risks No Fall Risks History of fall(s)   Follow up Falls evaluation completed Falls evaluation completed Falls evaluation completed Falls evaluation completed Falls evaluation completed    FALL RISK PREVENTION PERTAINING TO THE HOME: Home free of loose throw rugs in walkways, pet beds, electrical cords, etc? Yes  Adequate lighting in your home to reduce  risk of falls? Yes   ASSISTIVE DEVICES UTILIZED TO PREVENT FALLS: Life alert? No  Use of a cane, walker or w/c? Yes  Grab bars in the bathroom? Yes  Shower chair or bench in shower? Yes  Elevated toilet seat or a handicapped toilet? Yes   TIMED UP AND GO: Was the test performed? No .   Cognitive Function: Patient is alert and oriented x3.  Enjoys reading for brain health stimulating activities.     04/20/2016    4:02 PM  MMSE - Mini Mental State Exam  Orientation to time 5  Orientation to Place 5  Registration 3  Attention/ Calculation 5  Recall 3  Language- name 2 objects 2  Language- repeat 1  Language- follow 3 step command 3  Language- read & follow direction 1  Write a sentence 1  Copy  design 1  Total score 30        08/22/2019    1:58 PM 08/17/2018   12:41 PM 05/31/2017    4:53 PM  6CIT Screen  What Year? 0 points 0 points 0 points  What month? 0 points 0 points 0 points  What time? 0 points 0 points 0 points  Count back from 20  0 points 0 points  Months in reverse 0 points 0 points 2 points  Repeat phrase 0 points  0 points  Total Score   2 points    Immunizations Immunization History  Administered Date(s) Administered   Influenza Split 11/25/2011   Influenza, High Dose Seasonal PF 01/30/2018   Influenza,inj,Quad PF,6+ Mos 11/22/2012   Influenza,inj,quad, With Preservative 12/07/2018   Influenza-Unspecified 12/20/2013, 01/07/2015, 01/09/2016, 12/15/2016, 12/06/2019, 01/15/2021   PFIZER(Purple Top)SARS-COV-2 Vaccination 04/28/2019, 05/23/2019, 01/01/2020, 07/31/2020   Pneumococcal Conjugate-13 01/03/2014   Pneumococcal Polysaccharide-23 11/25/2011, 02/09/2018   Tdap 12/06/2013   Zoster, Live 12/29/2009   Shingrix Completed?: No.    Education has been provided regarding the importance of this vaccine. Patient has been advised to call insurance company to determine out of pocket expense if they have not yet received this vaccine. Advised may also receive  vaccine at local pharmacy or Health Dept. Verbalized acceptance and understanding.  Screening Tests Health Maintenance  Topic Date Due   COVID-19 Vaccine (5 - Booster for Pfizer series) 09/09/2021 (Originally 09/25/2020)   Zoster Vaccines- Shingrix (1 of 2) 11/24/2021 (Originally 10/22/1954)   INFLUENZA VACCINE  10/06/2021   TETANUS/TDAP  12/07/2023   Pneumonia Vaccine 11+ Years old  Completed   DEXA SCAN  Completed   HPV VACCINES  Aged Out   Health Maintenance There are no preventive care reminders to display for this patient.  Lung Cancer Screening: (Low Dose CT Chest recommended if Age 5-80 years, 30 pack-year currently smoking OR have quit w/in 15years.) does not qualify.   Hepatitis C Screening: does not qualify.  Vision Screening: Recommended annual ophthalmology exams for early detection of glaucoma and other disorders of the eye.  Dental Screening: Recommended annual dental exams for proper oral hygiene  Community Resource Referral / Chronic Care Management: CRR required this visit?  No   CCM required this visit?  No      Plan:   Keep all routine maintenance appointments.   I have personally reviewed and noted the following in the patient's chart:   Medical and social history Use of alcohol, tobacco or illicit drugs  Current medications and supplements including opioid prescriptions. Taking Ultram and followed by PCP. Taking hydrocodone and followed by Ortho, Dr. Odis Luster.  Functional ability and status Nutritional status Physical activity Advanced directives List of other physicians Hospitalizations, surgeries, and ER visits in previous 12 months Vitals Screenings to include cognitive, depression, and falls Referrals and appointments  In addition, I have reviewed and discussed with patient certain preventive protocols, quality metrics, and best practice recommendations. A written personalized care plan for preventive services as well as general preventive health  recommendations were provided to patient.     OBrien-Blaney, Adilyn Humes L, LPN   1/61/0960      I have reviewed the above information and agree with above.   Duncan Dull, MD

## 2021-08-25 DIAGNOSIS — Z79891 Long term (current) use of opiate analgesic: Secondary | ICD-10-CM | POA: Diagnosis not present

## 2021-08-25 DIAGNOSIS — I82401 Acute embolism and thrombosis of unspecified deep veins of right lower extremity: Secondary | ICD-10-CM | POA: Diagnosis not present

## 2021-08-25 DIAGNOSIS — M1030 Gout due to renal impairment, unspecified site: Secondary | ICD-10-CM | POA: Diagnosis not present

## 2021-08-25 DIAGNOSIS — E7800 Pure hypercholesterolemia, unspecified: Secondary | ICD-10-CM | POA: Diagnosis not present

## 2021-08-25 DIAGNOSIS — M48061 Spinal stenosis, lumbar region without neurogenic claudication: Secondary | ICD-10-CM | POA: Diagnosis not present

## 2021-08-25 DIAGNOSIS — G8929 Other chronic pain: Secondary | ICD-10-CM | POA: Diagnosis not present

## 2021-08-25 DIAGNOSIS — Z471 Aftercare following joint replacement surgery: Secondary | ICD-10-CM | POA: Diagnosis not present

## 2021-08-25 DIAGNOSIS — Z7952 Long term (current) use of systemic steroids: Secondary | ICD-10-CM | POA: Diagnosis not present

## 2021-08-25 DIAGNOSIS — F32A Depression, unspecified: Secondary | ICD-10-CM | POA: Diagnosis not present

## 2021-08-25 DIAGNOSIS — I4891 Unspecified atrial fibrillation: Secondary | ICD-10-CM | POA: Diagnosis not present

## 2021-08-25 DIAGNOSIS — N183 Chronic kidney disease, stage 3 unspecified: Secondary | ICD-10-CM | POA: Diagnosis not present

## 2021-08-25 DIAGNOSIS — I700 Atherosclerosis of aorta: Secondary | ICD-10-CM | POA: Diagnosis not present

## 2021-08-25 DIAGNOSIS — J45909 Unspecified asthma, uncomplicated: Secondary | ICD-10-CM | POA: Diagnosis not present

## 2021-08-25 DIAGNOSIS — K219 Gastro-esophageal reflux disease without esophagitis: Secondary | ICD-10-CM | POA: Diagnosis not present

## 2021-08-25 DIAGNOSIS — Z9181 History of falling: Secondary | ICD-10-CM | POA: Diagnosis not present

## 2021-08-25 DIAGNOSIS — I8390 Asymptomatic varicose veins of unspecified lower extremity: Secondary | ICD-10-CM | POA: Diagnosis not present

## 2021-08-25 DIAGNOSIS — Z7901 Long term (current) use of anticoagulants: Secondary | ICD-10-CM | POA: Diagnosis not present

## 2021-08-25 DIAGNOSIS — Z96651 Presence of right artificial knee joint: Secondary | ICD-10-CM | POA: Diagnosis not present

## 2021-08-25 DIAGNOSIS — R42 Dizziness and giddiness: Secondary | ICD-10-CM | POA: Diagnosis not present

## 2021-08-25 DIAGNOSIS — M1712 Unilateral primary osteoarthritis, left knee: Secondary | ICD-10-CM | POA: Diagnosis not present

## 2021-08-25 DIAGNOSIS — K900 Celiac disease: Secondary | ICD-10-CM | POA: Diagnosis not present

## 2021-08-25 DIAGNOSIS — I129 Hypertensive chronic kidney disease with stage 1 through stage 4 chronic kidney disease, or unspecified chronic kidney disease: Secondary | ICD-10-CM | POA: Diagnosis not present

## 2021-08-27 DIAGNOSIS — G8929 Other chronic pain: Secondary | ICD-10-CM | POA: Diagnosis not present

## 2021-08-27 DIAGNOSIS — K219 Gastro-esophageal reflux disease without esophagitis: Secondary | ICD-10-CM | POA: Diagnosis not present

## 2021-08-27 DIAGNOSIS — R42 Dizziness and giddiness: Secondary | ICD-10-CM | POA: Diagnosis not present

## 2021-08-27 DIAGNOSIS — Z471 Aftercare following joint replacement surgery: Secondary | ICD-10-CM | POA: Diagnosis not present

## 2021-08-27 DIAGNOSIS — I129 Hypertensive chronic kidney disease with stage 1 through stage 4 chronic kidney disease, or unspecified chronic kidney disease: Secondary | ICD-10-CM | POA: Diagnosis not present

## 2021-08-27 DIAGNOSIS — Z79891 Long term (current) use of opiate analgesic: Secondary | ICD-10-CM | POA: Diagnosis not present

## 2021-08-27 DIAGNOSIS — Z96651 Presence of right artificial knee joint: Secondary | ICD-10-CM | POA: Diagnosis not present

## 2021-08-27 DIAGNOSIS — M1030 Gout due to renal impairment, unspecified site: Secondary | ICD-10-CM | POA: Diagnosis not present

## 2021-08-27 DIAGNOSIS — I4891 Unspecified atrial fibrillation: Secondary | ICD-10-CM | POA: Diagnosis not present

## 2021-08-27 DIAGNOSIS — N183 Chronic kidney disease, stage 3 unspecified: Secondary | ICD-10-CM | POA: Diagnosis not present

## 2021-08-27 DIAGNOSIS — F32A Depression, unspecified: Secondary | ICD-10-CM | POA: Diagnosis not present

## 2021-08-27 DIAGNOSIS — M48061 Spinal stenosis, lumbar region without neurogenic claudication: Secondary | ICD-10-CM | POA: Diagnosis not present

## 2021-08-27 DIAGNOSIS — M1712 Unilateral primary osteoarthritis, left knee: Secondary | ICD-10-CM | POA: Diagnosis not present

## 2021-08-27 DIAGNOSIS — I82401 Acute embolism and thrombosis of unspecified deep veins of right lower extremity: Secondary | ICD-10-CM | POA: Diagnosis not present

## 2021-08-27 DIAGNOSIS — K900 Celiac disease: Secondary | ICD-10-CM | POA: Diagnosis not present

## 2021-08-27 DIAGNOSIS — I700 Atherosclerosis of aorta: Secondary | ICD-10-CM | POA: Diagnosis not present

## 2021-08-27 DIAGNOSIS — Z9181 History of falling: Secondary | ICD-10-CM | POA: Diagnosis not present

## 2021-08-27 DIAGNOSIS — I8390 Asymptomatic varicose veins of unspecified lower extremity: Secondary | ICD-10-CM | POA: Diagnosis not present

## 2021-08-27 DIAGNOSIS — J45909 Unspecified asthma, uncomplicated: Secondary | ICD-10-CM | POA: Diagnosis not present

## 2021-08-27 DIAGNOSIS — Z7901 Long term (current) use of anticoagulants: Secondary | ICD-10-CM | POA: Diagnosis not present

## 2021-08-27 DIAGNOSIS — Z7952 Long term (current) use of systemic steroids: Secondary | ICD-10-CM | POA: Diagnosis not present

## 2021-08-27 DIAGNOSIS — E7800 Pure hypercholesterolemia, unspecified: Secondary | ICD-10-CM | POA: Diagnosis not present

## 2021-08-31 DIAGNOSIS — R42 Dizziness and giddiness: Secondary | ICD-10-CM | POA: Diagnosis not present

## 2021-08-31 DIAGNOSIS — H903 Sensorineural hearing loss, bilateral: Secondary | ICD-10-CM | POA: Diagnosis not present

## 2021-09-01 DIAGNOSIS — Z471 Aftercare following joint replacement surgery: Secondary | ICD-10-CM | POA: Diagnosis not present

## 2021-09-01 DIAGNOSIS — I82401 Acute embolism and thrombosis of unspecified deep veins of right lower extremity: Secondary | ICD-10-CM | POA: Diagnosis not present

## 2021-09-01 DIAGNOSIS — M48061 Spinal stenosis, lumbar region without neurogenic claudication: Secondary | ICD-10-CM | POA: Diagnosis not present

## 2021-09-01 DIAGNOSIS — M1712 Unilateral primary osteoarthritis, left knee: Secondary | ICD-10-CM | POA: Diagnosis not present

## 2021-09-01 DIAGNOSIS — I8390 Asymptomatic varicose veins of unspecified lower extremity: Secondary | ICD-10-CM | POA: Diagnosis not present

## 2021-09-01 DIAGNOSIS — G8929 Other chronic pain: Secondary | ICD-10-CM | POA: Diagnosis not present

## 2021-09-01 DIAGNOSIS — M1030 Gout due to renal impairment, unspecified site: Secondary | ICD-10-CM | POA: Diagnosis not present

## 2021-09-01 DIAGNOSIS — J45909 Unspecified asthma, uncomplicated: Secondary | ICD-10-CM | POA: Diagnosis not present

## 2021-09-01 DIAGNOSIS — Z79891 Long term (current) use of opiate analgesic: Secondary | ICD-10-CM | POA: Diagnosis not present

## 2021-09-01 DIAGNOSIS — E7800 Pure hypercholesterolemia, unspecified: Secondary | ICD-10-CM | POA: Diagnosis not present

## 2021-09-01 DIAGNOSIS — Z7901 Long term (current) use of anticoagulants: Secondary | ICD-10-CM | POA: Diagnosis not present

## 2021-09-01 DIAGNOSIS — I129 Hypertensive chronic kidney disease with stage 1 through stage 4 chronic kidney disease, or unspecified chronic kidney disease: Secondary | ICD-10-CM | POA: Diagnosis not present

## 2021-09-01 DIAGNOSIS — K900 Celiac disease: Secondary | ICD-10-CM | POA: Diagnosis not present

## 2021-09-01 DIAGNOSIS — N183 Chronic kidney disease, stage 3 unspecified: Secondary | ICD-10-CM | POA: Diagnosis not present

## 2021-09-01 DIAGNOSIS — F32A Depression, unspecified: Secondary | ICD-10-CM | POA: Diagnosis not present

## 2021-09-01 DIAGNOSIS — I700 Atherosclerosis of aorta: Secondary | ICD-10-CM | POA: Diagnosis not present

## 2021-09-01 DIAGNOSIS — Z96651 Presence of right artificial knee joint: Secondary | ICD-10-CM | POA: Diagnosis not present

## 2021-09-01 DIAGNOSIS — K219 Gastro-esophageal reflux disease without esophagitis: Secondary | ICD-10-CM | POA: Diagnosis not present

## 2021-09-01 DIAGNOSIS — Z9181 History of falling: Secondary | ICD-10-CM | POA: Diagnosis not present

## 2021-09-01 DIAGNOSIS — I4891 Unspecified atrial fibrillation: Secondary | ICD-10-CM | POA: Diagnosis not present

## 2021-09-01 DIAGNOSIS — Z7952 Long term (current) use of systemic steroids: Secondary | ICD-10-CM | POA: Diagnosis not present

## 2021-09-01 DIAGNOSIS — R42 Dizziness and giddiness: Secondary | ICD-10-CM | POA: Diagnosis not present

## 2021-09-03 DIAGNOSIS — Z7901 Long term (current) use of anticoagulants: Secondary | ICD-10-CM | POA: Diagnosis not present

## 2021-09-03 DIAGNOSIS — E7800 Pure hypercholesterolemia, unspecified: Secondary | ICD-10-CM | POA: Diagnosis not present

## 2021-09-03 DIAGNOSIS — I4891 Unspecified atrial fibrillation: Secondary | ICD-10-CM | POA: Diagnosis not present

## 2021-09-03 DIAGNOSIS — I700 Atherosclerosis of aorta: Secondary | ICD-10-CM | POA: Diagnosis not present

## 2021-09-03 DIAGNOSIS — R42 Dizziness and giddiness: Secondary | ICD-10-CM | POA: Diagnosis not present

## 2021-09-03 DIAGNOSIS — M48061 Spinal stenosis, lumbar region without neurogenic claudication: Secondary | ICD-10-CM | POA: Diagnosis not present

## 2021-09-03 DIAGNOSIS — I82401 Acute embolism and thrombosis of unspecified deep veins of right lower extremity: Secondary | ICD-10-CM | POA: Diagnosis not present

## 2021-09-03 DIAGNOSIS — I129 Hypertensive chronic kidney disease with stage 1 through stage 4 chronic kidney disease, or unspecified chronic kidney disease: Secondary | ICD-10-CM | POA: Diagnosis not present

## 2021-09-03 DIAGNOSIS — F32A Depression, unspecified: Secondary | ICD-10-CM | POA: Diagnosis not present

## 2021-09-03 DIAGNOSIS — G8929 Other chronic pain: Secondary | ICD-10-CM | POA: Diagnosis not present

## 2021-09-03 DIAGNOSIS — M1030 Gout due to renal impairment, unspecified site: Secondary | ICD-10-CM | POA: Diagnosis not present

## 2021-09-03 DIAGNOSIS — Z9181 History of falling: Secondary | ICD-10-CM | POA: Diagnosis not present

## 2021-09-03 DIAGNOSIS — Z7952 Long term (current) use of systemic steroids: Secondary | ICD-10-CM | POA: Diagnosis not present

## 2021-09-03 DIAGNOSIS — K219 Gastro-esophageal reflux disease without esophagitis: Secondary | ICD-10-CM | POA: Diagnosis not present

## 2021-09-03 DIAGNOSIS — M1712 Unilateral primary osteoarthritis, left knee: Secondary | ICD-10-CM | POA: Diagnosis not present

## 2021-09-03 DIAGNOSIS — Z79891 Long term (current) use of opiate analgesic: Secondary | ICD-10-CM | POA: Diagnosis not present

## 2021-09-03 DIAGNOSIS — N183 Chronic kidney disease, stage 3 unspecified: Secondary | ICD-10-CM | POA: Diagnosis not present

## 2021-09-03 DIAGNOSIS — K900 Celiac disease: Secondary | ICD-10-CM | POA: Diagnosis not present

## 2021-09-03 DIAGNOSIS — J45909 Unspecified asthma, uncomplicated: Secondary | ICD-10-CM | POA: Diagnosis not present

## 2021-09-03 DIAGNOSIS — I8390 Asymptomatic varicose veins of unspecified lower extremity: Secondary | ICD-10-CM | POA: Diagnosis not present

## 2021-09-03 DIAGNOSIS — Z96651 Presence of right artificial knee joint: Secondary | ICD-10-CM | POA: Diagnosis not present

## 2021-09-03 DIAGNOSIS — Z471 Aftercare following joint replacement surgery: Secondary | ICD-10-CM | POA: Diagnosis not present

## 2021-09-14 ENCOUNTER — Ambulatory Visit (INDEPENDENT_AMBULATORY_CARE_PROVIDER_SITE_OTHER): Payer: Medicare Other

## 2021-09-14 DIAGNOSIS — I2584 Coronary atherosclerosis due to calcified coronary lesion: Secondary | ICD-10-CM | POA: Diagnosis not present

## 2021-09-14 DIAGNOSIS — I2510 Atherosclerotic heart disease of native coronary artery without angina pectoris: Secondary | ICD-10-CM

## 2021-09-14 DIAGNOSIS — R072 Precordial pain: Secondary | ICD-10-CM

## 2021-09-14 LAB — ECHOCARDIOGRAM COMPLETE
AR max vel: 1.44 cm2
AV Area VTI: 1.49 cm2
AV Area mean vel: 1.36 cm2
AV Mean grad: 6 mmHg
AV Peak grad: 10.4 mmHg
Ao pk vel: 1.61 m/s
Calc EF: 56.8 %
S' Lateral: 3.2 cm
Single Plane A2C EF: 57 %
Single Plane A4C EF: 56.3 %

## 2021-09-16 ENCOUNTER — Other Ambulatory Visit: Payer: Medicare Other

## 2021-09-21 ENCOUNTER — Other Ambulatory Visit: Payer: Self-pay

## 2021-09-21 ENCOUNTER — Encounter: Payer: Self-pay | Admitting: Nurse Practitioner

## 2021-09-21 ENCOUNTER — Ambulatory Visit: Payer: Medicare Other | Admitting: Nurse Practitioner

## 2021-09-21 VITALS — BP 120/60 | HR 79 | Ht 64.0 in | Wt 177.1 lb

## 2021-09-21 DIAGNOSIS — I2510 Atherosclerotic heart disease of native coronary artery without angina pectoris: Secondary | ICD-10-CM

## 2021-09-21 DIAGNOSIS — I10 Essential (primary) hypertension: Secondary | ICD-10-CM

## 2021-09-21 DIAGNOSIS — I480 Paroxysmal atrial fibrillation: Secondary | ICD-10-CM

## 2021-09-21 DIAGNOSIS — E782 Mixed hyperlipidemia: Secondary | ICD-10-CM

## 2021-09-21 DIAGNOSIS — I82461 Acute embolism and thrombosis of right calf muscular vein: Secondary | ICD-10-CM

## 2021-09-21 MED ORDER — APIXABAN 5 MG PO TABS: 5.0000 mg | ORAL_TABLET | Freq: Two times a day (BID) | ORAL | 5 refills | Status: DC

## 2021-09-21 NOTE — Telephone Encounter (Signed)
Prescription refill request for Eliquis received. Indication: Afib  Last office visit: 09/21/21 Jamie Anderson)  Scr: 1.0 (07/10/21) Age: 86 Weight: 80.3kg  Appropriate dose and refill sent to requested pharmacy.

## 2021-09-21 NOTE — Patient Instructions (Signed)
Medication Instructions:  Your physician recommends that you continue on your current medications as directed. Please refer to the Current Medication list given to you today.  *If you need a refill on your cardiac medications before your next appointment, please call your pharmacy*   Lab Work: None ordered If you have labs (blood work) drawn today and your tests are completely normal, you will receive your results only by: Cloud (if you have MyChart) OR A paper copy in the mail If you have any lab test that is abnormal or we need to change your treatment, we will call you to review the results.   Testing/Procedures: None ordered   Follow-Up: At [facility], you and your health needs are our priority.  As part of our continuing mission to provide you with exceptional heart care, we have created designated Provider Care Teams.  These Care Teams include your primary Cardiologist (physician) and Advanced Practice Providers (APPs -  Physician Assistants and Nurse Practitioners) who all work together to provide you with the care you need, when you need it.  We recommend signing up for the patient portal called "MyChart".  Sign up information is provided on this After Visit Summary.  MyChart is used to connect with patients for Virtual Visits (Telemedicine).  Patients are able to view lab/test results, encounter notes, upcoming appointments, etc.  Non-urgent messages can be sent to your provider as well.   To learn more about what you can do with MyChart, go to [email or web address].    Your next appointment:   3 month(s)  The format for your next appointment:   In Person  Provider:   You may see Ida Rogue, MD or one of the following Advanced Practice Providers on your designated Care Team:   Murray Hodgkins, NP  Other Instructions N/A  Important Information About Sugar

## 2021-09-21 NOTE — Progress Notes (Signed)
Office Visit    Patient Name: Jamie Anderson Date of Encounter: 09/21/2021  Primary Care Provider:  Crecencio Mc, MD Primary Cardiologist:  Ida Rogue, MD  Chief Complaint    86 year old female with a history of coronary calcification, nonobstructive CAD, aortic atherosclerosis on CT, hypertension, hyperlipidemia, right lower extremity DVT following knee surgery in February 2023, PAF, Mnire's disease, and chronic back pain, who presents for follow-up related to recent testing.  Past Medical History    Past Medical History:  Diagnosis Date   Arthritis    Asthma    Basal cell carcinoma 01/17/2018   Left chest. Infiltrative. Excised 02/06/2018, margins free.   Cystic teratoma of right ovary 11/23/2012   She underwent right salpingo-oophorectomy on April 1 by Dr. Kenton Kingfisher for removal of what turned out to be a cystic teratoma. Hysteroscopy was also done at the time due to postmenopausal bleeding. It is unclear  why the left ovary was not taken out since she is postmenopausal.    Diastolic dysfunction    a. 09/2021 Echo: EF 55-60%, no rwma, GrII DD, nl RV fxn, mild MR, triv AI.   Dyspnea    GERD (gastroesophageal reflux disease)    Granuloma annulare    Hyperlipidemia    Hypertension    Menopause    Non-obstructive CAD (coronary artery disease)    a. 07/2021 Cor CTA: LM <25, LAD 25-49p, LCX <25p, RCA nl. Ca2+ = 390 (71st%'ile).   PAF (paroxysmal atrial fibrillation) ([place])    a. 04/2021 - occured following R knee surgery.  CHA2DS2VASc = 5--> Eliquis.   Plantar fasciitis of right foot 12/24/2014   Right knee DJD 04/03/2012   SCC (squamous cell carcinoma) 10/08/2020   R mid lower pretibia, EDC   SCC (squamous cell carcinoma) 03/16/2021   R upper pretibial, needs EDC pt sched 06/02/21   Squamous cell carcinoma of skin 02/26/2020   R lower pretibial, clear with bx 04/01/20   Squamous cell carcinoma of skin 07/15/2020   R mid pretibia, EDC   Squamous cell carcinoma of skin  12/09/2020   R lateral pretibia, EDC   Past Surgical History:  Procedure Laterality Date   APPENDECTOMY     CATARACT EXTRACTION, BILATERAL     DILATION AND CURETTAGE OF UTERUS     RIGHT OOPHORECTOMY Right 04/08/2012   [name]   SPINE SURGERY  03/08/2008   TOTAL KNEE ARTHROPLASTY Right 04/29/2021   Procedure: TOTAL KNEE ARTHROPLASTY;  Surgeon: Lovell Sheehan, MD;  Location: [facility] ORS;  Service: Orthopedics;  Laterality: Right;   VENOUS ABLATION     bilateral    Allergies  Allergies  Allergen Reactions   Clarithromycin     Unknown   Colloidal Oatmeal     Unknown reaction   Dust Mite Extract    Gluten Meal     Unknown   Oat     Unknown reaction   Other     Almonds - unknown reaction Mold Spores   Shrimp [Shellfish Allergy]     Unknown reaction     History of Present Illness    86 year old female with the above past medical history including coronary calcifications, nonobstructive CAD, aortic atherosclerosis, hypertension, hyperlipidemia, PAF (04/2021), right lower extremity DVT following knee surgery in February 2023, Mnire's disease, and chronic back pain.  Patient was admitted in February 2023 for elective right knee surgery.  Postoperatively, she was diagnosed with a DVT and placed on Eliquis.  Upon review of discharge summary, I noted  a discharge diagnosis of paroxysmal atrial fibrillation.  Upon searching further, though there are no twelve-lead in the chart that documented A-fib, I was able to find rhythm strips from May 03, 2021, which do show A-fib with RVR.  She was managed by the hospitalist team and has been on Eliquis and beta-blocker therapy.  She was seen in the emergency department in May 2023 with dizziness and left arm/neck pain.  Troponins were normal x2.  Chest x-ray was unremarkable.  She subsequently left without being seen and schedule a follow-up appointment with [place] on May 9, at which time she reported intermittent left shoulder discomfort.  Repeat  troponin in the office that day was normal.  She subsequently underwent coronary CT angiogram which showed a 25 to 49% proximal LAD stenosis and less than 25% left main and LCX plaque.  Symptoms were not felt to be cardiac in origin.  Echocardiogram was performed September 05, 2021 showing an EF of 55 to 60% with grade 2 diastolic dysfunction, normal RV function, mild MR, and trivial AI.  Since her last visit, she notes that she has felt well.  Shoulder pain resolved after receiving a steroid injection.  She denies chest pain or dyspnea but notes that activity is fairly limited in the setting of ongoing right knee pain, noting that she has not really ever recovered from her surgery in February.  She still sometimes experiences an unsteadiness when she awakens at night to use the bathroom.  She will often dangle her feet and then stand slowly and steady herself by placing her hand on her dresser.  Once steady, she will walk into the bathroom and symptoms seem to have resolved.  She does have a walker but does not feel like she needs to use it.  She is no longer experiencing vertiginous symptoms.  She denies chest pain, dyspnea, palpitations, PND, orthopnea, syncope, or early satiety.  She sometimes notes mild lower extremity swelling.  Home Medications    Current Outpatient Medications  Medication Sig Dispense Refill   acetaminophen (TYLENOL) 325 MG tablet Take 1-2 tablets (325-650 mg total) by mouth every 6 (six) hours as needed for mild pain (pain score 1-3 or temp > 100.5). 40 tablet 1   albuterol (VENTOLIN HFA) 108 (90 Base) MCG/ACT inhaler Inhale 1 puff into the lungs every 6 (six) hours as needed for wheezing or shortness of breath. 1 each 0   allopurinol (ZYLOPRIM) 100 MG tablet TAKE 1 TABLET BY MOUTH  DAILY 90 tablet 3   calcitRIOL (ROCALTROL) 0.25 MCG capsule Take 0.25 mcg by mouth daily.     calcium carbonate (TUMS - DOSED IN MG ELEMENTAL CALCIUM) 500 MG chewable tablet Chew 1 tablet by mouth daily as  needed for indigestion or heartburn.     cetirizine (ZYRTEC) 10 MG tablet Take 10 mg by mouth daily as needed for allergies.     clobetasol cream (TEMOVATE) 0.05 % Apply to itchy rash on back 1-2 times a day until improved. Avoid face, groin, axilla. 30 g 1   conjugated estrogens (PREMARIN) vaginal cream Place 1 applicator vaginally as needed.     cyanocobalamin (,VITAMIN B-12,) 1000 MCG/ML injection Inject 1 mL (1,000 mcg total) into the muscle every 14 (fourteen) days. 1 mL 1   ezetimibe (ZETIA) 10 MG tablet TAKE 1 TABLET BY MOUTH  DAILY 90 tablet 3   fluocinonide cream (LIDEX) 8.10 % Apply 1 application. topically 2 (two) times daily as needed (itching).     fluticasone (FLONASE)  50 MCG/ACT nasal spray Place 2 sprays into both nostrils daily as needed for allergies or rhinitis.     fluticasone-salmeterol (ADVAIR) 100-50 MCG/ACT AEPB Inhale 1 puff into the lungs 2 (two) times daily as needed.     HYDROcodone-acetaminophen (NORCO/VICODIN) 5-325 MG tablet      Iron, Ferrous Sulfate, 325 (65 Fe) MG TABS Take 325 mg by mouth daily. With  food or orange juice 90 tablet 0   losartan (COZAAR) 100 MG tablet Take 1 tablet (100 mg total) by mouth daily. 90 tablet 3   meclizine (ANTIVERT) 25 MG tablet Take 1 tablet (25 mg total) by mouth 2 (two) times daily. 180 tablet 3   metoprolol succinate (TOPROL-XL) 25 MG 24 hr tablet Take 1 tablet (25 mg total) by mouth at bedtime. 90 tablet 3   pantoprazole (PROTONIX) 40 MG tablet TAKE 1 TABLET BY MOUTH  DAILY 90 tablet 3   sertraline (ZOLOFT) 100 MG tablet TAKE 1 TABLET BY MOUTH  DAILY 90 tablet 3   simvastatin (ZOCOR) 40 MG tablet Take 1 tablet (40 mg total) by mouth at bedtime. 90 tablet 3   traMADol (ULTRAM) 50 MG tablet Take 50 mg by mouth every 6 (six) hours as needed.     VITAMIN D PO Take 1,000 Int'l Units/day by mouth daily.     apixaban (ELIQUIS) 5 MG TABS tablet Take 1 tablet (5 mg total) by mouth 2 (two) times daily. 60 tablet 5   predniSONE  (DELTASONE) 10 MG tablet 6 tablets on Day 1 , then reduce by 1 tablet daily until gone (Patient not taking: Reported on 09/21/2021) 21 tablet 0   No current facility-administered medications for this visit.     Review of Systems    Occasional unsteadiness, especially when she gets up to use the bathroom at night but she denies vertigo or presyncope.  Occasional lower extremity swelling.  No chest pain, dyspnea, palpitations, PND, orthopnea, syncope, or early satiety.  All other systems reviewed and are otherwise negative except as noted above.    Physical Exam    VS:  BP 120/60 (BP Location: Left Arm, Patient Position: Sitting, Cuff Size: Normal)   Pulse 79   Ht '5\' 4"'$  (1.626 m)   Wt 177 lb 2 oz (80.3 kg)   SpO2 98%   BMI 30.40 kg/m  , BMI Body mass index is 30.4 kg/m.     GEN: Well nourished, well developed, in no acute distress. HEENT: normal. Neck: Supple, no JVD, carotid bruits, or masses. Cardiac: RRR, no murmurs, rubs, or gallops. No clubbing, cyanosis, trace bilateral lower extremity edema.  Radials/PT 2+ and equal bilaterally.  Respiratory:  Respirations regular and unlabored, clear to auscultation bilaterally. GI: Soft, nontender, nondistended, BS + x 4. MS: no deformity or atrophy. Skin: warm and dry, no rash. Neuro:  Strength and sensation are intact. Psych: Normal affect.  Accessory Clinical Findings    ECG personally reviewed by me today -regular sinus rhythm, 79- no acute changes.  Lab Results  Component Value Date   WBC 5.6 06/08/2021   HGB 9.7 (L) 06/08/2021   HCT 28.9 (L) 06/08/2021   MCV 83.8 06/08/2021   PLT 281.0 06/08/2021   Lab Results  Component Value Date   CREATININE 0.97 06/08/2021   BUN 14 06/08/2021   NA 141 06/08/2021   K 4.3 06/08/2021   CL 106 06/08/2021   CO2 27 06/08/2021   Lab Results  Component Value Date   ALT 10 07/17/2021  AST 13 07/17/2021   ALKPHOS 109 07/17/2021   BILITOT 0.3 07/17/2021   Lab Results  Component Value  Date   CHOL 137 07/17/2021   HDL 65.60 07/17/2021   LDLCALC 50 07/17/2021   LDLDIRECT 59.0 01/30/2018   TRIG 108.0 07/17/2021   CHOLHDL 2 07/17/2021    Lab Results  Component Value Date   HGBA1C 5.5 01/30/2018    Assessment & Plan    1.  Nonobstructive coronary artery disease/precordial chest pain: Patient previously underwent coronary CT angiogram in May 2023 in the setting of left shoulder and chest discomfort.  This showed nonobstructive disease.  We discussed coronary CTA results in detail today, along with her echocardiogram that showed normal LV function, grade 2 diastolic dysfunction, mild MR, and trivial AI.  She has not been having any chest discomfort or dyspnea recently.  EKG is normal today.  Encouraged ongoing therapy with statin and Zetia (LDL 50 in May 2023).  She is not on aspirin in the setting of Eliquis therapy.  2.  Paroxysmal atrial fibrillation: This was noted during her hospitalization in February 2023 following right knee surgery.  Review of hospital records shows that she was managed by the hospitalist team with beta-blocker and Eliquis therapy.  There are no twelve-lead EKGs that document atrial fibrillation however, I was able to find the rhythm strips from February 26, that do show rapid A-fib in the 130s.  Patient does not remember being symptomatic and has had no recent tachypalpitations but notes that sometimes on her pulse oximeter, she notes an irregular heartbeat.  She is in sinus rhythm today.  Continue beta-blocker and Eliquis therapy.  She did question the duration of Eliquis following DVT and paroxysmal atrial fibrillation, and in light of finding of paroxysmal A-fib, we will plan to continue anticoagulation indefinitely.  3.  Essential hypertension: Stable on beta-blocker and ARB therapy.  4.  Hyperlipidemia: LDL of 50 in May 2023 with normal LFTs at that time.  She remains on simvastatin and Zetia.  5.  Dizziness/unsteadiness: Patient previously with  symptoms of dizziness concerning for vertigo.  She was previously prescribed meclizine 25 mg twice daily.  She says she has not had any recurrent vertiginous symptoms.  She sometimes feels unsteady when she gets up in the middle of the night to use the bathroom but this resolves shortly after standing and setting herself.  She has not had any presyncope or syncope.  Again encouraged slow and deliberate position changes along with adequate hydration.  6.  Right lower extremity DVT: This was provoked following right knee surgery.  As above, she remains on Eliquis in the setting of paroxysmal atrial fibrillation that also occurred at that time.  7.  Disposition: Follow-up in clinic in 3 months or sooner if necessary.   Murray Hodgkins, NP 09/21/2021, 5:08 PM

## 2021-09-21 NOTE — Telephone Encounter (Signed)
Prescription refill request for Eliquis received. Indication: PAF Last office visit: 09/21/21  Laban Emperor NP Scr: 1.0 on 07/10/21 Age: 86 Weight: 80.3kg  Based on above findings Eliquis '5mg'$  twice daily is the appropriate dose.  Refill approved.

## 2021-09-24 DIAGNOSIS — Z79891 Long term (current) use of opiate analgesic: Secondary | ICD-10-CM | POA: Diagnosis not present

## 2021-09-24 DIAGNOSIS — Z9181 History of falling: Secondary | ICD-10-CM | POA: Diagnosis not present

## 2021-09-24 DIAGNOSIS — Z7901 Long term (current) use of anticoagulants: Secondary | ICD-10-CM | POA: Diagnosis not present

## 2021-09-24 DIAGNOSIS — F32A Depression, unspecified: Secondary | ICD-10-CM | POA: Diagnosis not present

## 2021-09-24 DIAGNOSIS — G8929 Other chronic pain: Secondary | ICD-10-CM | POA: Diagnosis not present

## 2021-09-24 DIAGNOSIS — M1030 Gout due to renal impairment, unspecified site: Secondary | ICD-10-CM | POA: Diagnosis not present

## 2021-09-24 DIAGNOSIS — J45909 Unspecified asthma, uncomplicated: Secondary | ICD-10-CM | POA: Diagnosis not present

## 2021-09-24 DIAGNOSIS — Z96651 Presence of right artificial knee joint: Secondary | ICD-10-CM | POA: Diagnosis not present

## 2021-09-24 DIAGNOSIS — I82401 Acute embolism and thrombosis of unspecified deep veins of right lower extremity: Secondary | ICD-10-CM | POA: Diagnosis not present

## 2021-09-24 DIAGNOSIS — M1712 Unilateral primary osteoarthritis, left knee: Secondary | ICD-10-CM | POA: Diagnosis not present

## 2021-09-24 DIAGNOSIS — N183 Chronic kidney disease, stage 3 unspecified: Secondary | ICD-10-CM | POA: Diagnosis not present

## 2021-09-24 DIAGNOSIS — M48061 Spinal stenosis, lumbar region without neurogenic claudication: Secondary | ICD-10-CM | POA: Diagnosis not present

## 2021-09-24 DIAGNOSIS — Z471 Aftercare following joint replacement surgery: Secondary | ICD-10-CM | POA: Diagnosis not present

## 2021-09-24 DIAGNOSIS — I700 Atherosclerosis of aorta: Secondary | ICD-10-CM | POA: Diagnosis not present

## 2021-09-24 DIAGNOSIS — E7800 Pure hypercholesterolemia, unspecified: Secondary | ICD-10-CM | POA: Diagnosis not present

## 2021-09-24 DIAGNOSIS — Z7952 Long term (current) use of systemic steroids: Secondary | ICD-10-CM | POA: Diagnosis not present

## 2021-09-24 DIAGNOSIS — K900 Celiac disease: Secondary | ICD-10-CM | POA: Diagnosis not present

## 2021-09-24 DIAGNOSIS — I8390 Asymptomatic varicose veins of unspecified lower extremity: Secondary | ICD-10-CM | POA: Diagnosis not present

## 2021-09-24 DIAGNOSIS — I4891 Unspecified atrial fibrillation: Secondary | ICD-10-CM | POA: Diagnosis not present

## 2021-09-24 DIAGNOSIS — I129 Hypertensive chronic kidney disease with stage 1 through stage 4 chronic kidney disease, or unspecified chronic kidney disease: Secondary | ICD-10-CM | POA: Diagnosis not present

## 2021-09-24 DIAGNOSIS — R42 Dizziness and giddiness: Secondary | ICD-10-CM | POA: Diagnosis not present

## 2021-09-24 DIAGNOSIS — K219 Gastro-esophageal reflux disease without esophagitis: Secondary | ICD-10-CM | POA: Diagnosis not present

## 2021-09-28 DIAGNOSIS — Z7901 Long term (current) use of anticoagulants: Secondary | ICD-10-CM | POA: Diagnosis not present

## 2021-09-28 DIAGNOSIS — M1712 Unilateral primary osteoarthritis, left knee: Secondary | ICD-10-CM | POA: Diagnosis not present

## 2021-09-28 DIAGNOSIS — I4891 Unspecified atrial fibrillation: Secondary | ICD-10-CM | POA: Diagnosis not present

## 2021-09-28 DIAGNOSIS — Z7952 Long term (current) use of systemic steroids: Secondary | ICD-10-CM | POA: Diagnosis not present

## 2021-09-28 DIAGNOSIS — I8390 Asymptomatic varicose veins of unspecified lower extremity: Secondary | ICD-10-CM | POA: Diagnosis not present

## 2021-09-28 DIAGNOSIS — I82401 Acute embolism and thrombosis of unspecified deep veins of right lower extremity: Secondary | ICD-10-CM | POA: Diagnosis not present

## 2021-09-28 DIAGNOSIS — K900 Celiac disease: Secondary | ICD-10-CM | POA: Diagnosis not present

## 2021-09-28 DIAGNOSIS — G8929 Other chronic pain: Secondary | ICD-10-CM | POA: Diagnosis not present

## 2021-09-28 DIAGNOSIS — Z96651 Presence of right artificial knee joint: Secondary | ICD-10-CM | POA: Diagnosis not present

## 2021-09-28 DIAGNOSIS — Z471 Aftercare following joint replacement surgery: Secondary | ICD-10-CM | POA: Diagnosis not present

## 2021-09-28 DIAGNOSIS — R42 Dizziness and giddiness: Secondary | ICD-10-CM | POA: Diagnosis not present

## 2021-09-28 DIAGNOSIS — K219 Gastro-esophageal reflux disease without esophagitis: Secondary | ICD-10-CM | POA: Diagnosis not present

## 2021-09-28 DIAGNOSIS — Z9181 History of falling: Secondary | ICD-10-CM | POA: Diagnosis not present

## 2021-09-28 DIAGNOSIS — F32A Depression, unspecified: Secondary | ICD-10-CM | POA: Diagnosis not present

## 2021-09-28 DIAGNOSIS — I700 Atherosclerosis of aorta: Secondary | ICD-10-CM | POA: Diagnosis not present

## 2021-09-28 DIAGNOSIS — M48061 Spinal stenosis, lumbar region without neurogenic claudication: Secondary | ICD-10-CM | POA: Diagnosis not present

## 2021-09-28 DIAGNOSIS — Z79891 Long term (current) use of opiate analgesic: Secondary | ICD-10-CM | POA: Diagnosis not present

## 2021-09-28 DIAGNOSIS — J45909 Unspecified asthma, uncomplicated: Secondary | ICD-10-CM | POA: Diagnosis not present

## 2021-09-28 DIAGNOSIS — N183 Chronic kidney disease, stage 3 unspecified: Secondary | ICD-10-CM | POA: Diagnosis not present

## 2021-09-28 DIAGNOSIS — M1030 Gout due to renal impairment, unspecified site: Secondary | ICD-10-CM | POA: Diagnosis not present

## 2021-09-28 DIAGNOSIS — E7800 Pure hypercholesterolemia, unspecified: Secondary | ICD-10-CM | POA: Diagnosis not present

## 2021-09-28 DIAGNOSIS — I129 Hypertensive chronic kidney disease with stage 1 through stage 4 chronic kidney disease, or unspecified chronic kidney disease: Secondary | ICD-10-CM | POA: Diagnosis not present

## 2021-10-07 DIAGNOSIS — I8390 Asymptomatic varicose veins of unspecified lower extremity: Secondary | ICD-10-CM | POA: Diagnosis not present

## 2021-10-07 DIAGNOSIS — M48061 Spinal stenosis, lumbar region without neurogenic claudication: Secondary | ICD-10-CM | POA: Diagnosis not present

## 2021-10-07 DIAGNOSIS — I82401 Acute embolism and thrombosis of unspecified deep veins of right lower extremity: Secondary | ICD-10-CM | POA: Diagnosis not present

## 2021-10-07 DIAGNOSIS — I700 Atherosclerosis of aorta: Secondary | ICD-10-CM | POA: Diagnosis not present

## 2021-10-07 DIAGNOSIS — I4891 Unspecified atrial fibrillation: Secondary | ICD-10-CM | POA: Diagnosis not present

## 2021-10-07 DIAGNOSIS — Z7901 Long term (current) use of anticoagulants: Secondary | ICD-10-CM | POA: Diagnosis not present

## 2021-10-07 DIAGNOSIS — E7800 Pure hypercholesterolemia, unspecified: Secondary | ICD-10-CM | POA: Diagnosis not present

## 2021-10-07 DIAGNOSIS — R42 Dizziness and giddiness: Secondary | ICD-10-CM | POA: Diagnosis not present

## 2021-10-07 DIAGNOSIS — M1712 Unilateral primary osteoarthritis, left knee: Secondary | ICD-10-CM | POA: Diagnosis not present

## 2021-10-07 DIAGNOSIS — Z471 Aftercare following joint replacement surgery: Secondary | ICD-10-CM | POA: Diagnosis not present

## 2021-10-07 DIAGNOSIS — Z9181 History of falling: Secondary | ICD-10-CM | POA: Diagnosis not present

## 2021-10-07 DIAGNOSIS — F32A Depression, unspecified: Secondary | ICD-10-CM | POA: Diagnosis not present

## 2021-10-07 DIAGNOSIS — N183 Chronic kidney disease, stage 3 unspecified: Secondary | ICD-10-CM | POA: Diagnosis not present

## 2021-10-07 DIAGNOSIS — Z96651 Presence of right artificial knee joint: Secondary | ICD-10-CM | POA: Diagnosis not present

## 2021-10-07 DIAGNOSIS — G8929 Other chronic pain: Secondary | ICD-10-CM | POA: Diagnosis not present

## 2021-10-07 DIAGNOSIS — Z79891 Long term (current) use of opiate analgesic: Secondary | ICD-10-CM | POA: Diagnosis not present

## 2021-10-07 DIAGNOSIS — I129 Hypertensive chronic kidney disease with stage 1 through stage 4 chronic kidney disease, or unspecified chronic kidney disease: Secondary | ICD-10-CM | POA: Diagnosis not present

## 2021-10-07 DIAGNOSIS — K219 Gastro-esophageal reflux disease without esophagitis: Secondary | ICD-10-CM | POA: Diagnosis not present

## 2021-10-07 DIAGNOSIS — J45909 Unspecified asthma, uncomplicated: Secondary | ICD-10-CM | POA: Diagnosis not present

## 2021-10-07 DIAGNOSIS — M1030 Gout due to renal impairment, unspecified site: Secondary | ICD-10-CM | POA: Diagnosis not present

## 2021-10-07 DIAGNOSIS — Z7952 Long term (current) use of systemic steroids: Secondary | ICD-10-CM | POA: Diagnosis not present

## 2021-10-07 DIAGNOSIS — K900 Celiac disease: Secondary | ICD-10-CM | POA: Diagnosis not present

## 2021-10-09 DIAGNOSIS — M1712 Unilateral primary osteoarthritis, left knee: Secondary | ICD-10-CM | POA: Diagnosis not present

## 2021-10-15 DIAGNOSIS — G8929 Other chronic pain: Secondary | ICD-10-CM | POA: Diagnosis not present

## 2021-10-15 DIAGNOSIS — M1030 Gout due to renal impairment, unspecified site: Secondary | ICD-10-CM | POA: Diagnosis not present

## 2021-10-15 DIAGNOSIS — I8390 Asymptomatic varicose veins of unspecified lower extremity: Secondary | ICD-10-CM | POA: Diagnosis not present

## 2021-10-15 DIAGNOSIS — Z9181 History of falling: Secondary | ICD-10-CM | POA: Diagnosis not present

## 2021-10-15 DIAGNOSIS — K219 Gastro-esophageal reflux disease without esophagitis: Secondary | ICD-10-CM | POA: Diagnosis not present

## 2021-10-15 DIAGNOSIS — Z471 Aftercare following joint replacement surgery: Secondary | ICD-10-CM | POA: Diagnosis not present

## 2021-10-15 DIAGNOSIS — I82401 Acute embolism and thrombosis of unspecified deep veins of right lower extremity: Secondary | ICD-10-CM | POA: Diagnosis not present

## 2021-10-15 DIAGNOSIS — I129 Hypertensive chronic kidney disease with stage 1 through stage 4 chronic kidney disease, or unspecified chronic kidney disease: Secondary | ICD-10-CM | POA: Diagnosis not present

## 2021-10-15 DIAGNOSIS — F32A Depression, unspecified: Secondary | ICD-10-CM | POA: Diagnosis not present

## 2021-10-15 DIAGNOSIS — K900 Celiac disease: Secondary | ICD-10-CM | POA: Diagnosis not present

## 2021-10-15 DIAGNOSIS — N183 Chronic kidney disease, stage 3 unspecified: Secondary | ICD-10-CM | POA: Diagnosis not present

## 2021-10-15 DIAGNOSIS — I4891 Unspecified atrial fibrillation: Secondary | ICD-10-CM | POA: Diagnosis not present

## 2021-10-15 DIAGNOSIS — J45909 Unspecified asthma, uncomplicated: Secondary | ICD-10-CM | POA: Diagnosis not present

## 2021-10-15 DIAGNOSIS — Z79891 Long term (current) use of opiate analgesic: Secondary | ICD-10-CM | POA: Diagnosis not present

## 2021-10-15 DIAGNOSIS — Z7901 Long term (current) use of anticoagulants: Secondary | ICD-10-CM | POA: Diagnosis not present

## 2021-10-15 DIAGNOSIS — I700 Atherosclerosis of aorta: Secondary | ICD-10-CM | POA: Diagnosis not present

## 2021-10-15 DIAGNOSIS — Z96651 Presence of right artificial knee joint: Secondary | ICD-10-CM | POA: Diagnosis not present

## 2021-10-15 DIAGNOSIS — M48061 Spinal stenosis, lumbar region without neurogenic claudication: Secondary | ICD-10-CM | POA: Diagnosis not present

## 2021-10-15 DIAGNOSIS — M1712 Unilateral primary osteoarthritis, left knee: Secondary | ICD-10-CM | POA: Diagnosis not present

## 2021-10-15 DIAGNOSIS — Z7952 Long term (current) use of systemic steroids: Secondary | ICD-10-CM | POA: Diagnosis not present

## 2021-10-15 DIAGNOSIS — R42 Dizziness and giddiness: Secondary | ICD-10-CM | POA: Diagnosis not present

## 2021-10-15 DIAGNOSIS — E7800 Pure hypercholesterolemia, unspecified: Secondary | ICD-10-CM | POA: Diagnosis not present

## 2021-10-23 DIAGNOSIS — Z79891 Long term (current) use of opiate analgesic: Secondary | ICD-10-CM | POA: Diagnosis not present

## 2021-10-23 DIAGNOSIS — I129 Hypertensive chronic kidney disease with stage 1 through stage 4 chronic kidney disease, or unspecified chronic kidney disease: Secondary | ICD-10-CM | POA: Diagnosis not present

## 2021-10-23 DIAGNOSIS — I4891 Unspecified atrial fibrillation: Secondary | ICD-10-CM | POA: Diagnosis not present

## 2021-10-23 DIAGNOSIS — I8390 Asymptomatic varicose veins of unspecified lower extremity: Secondary | ICD-10-CM | POA: Diagnosis not present

## 2021-10-23 DIAGNOSIS — J45909 Unspecified asthma, uncomplicated: Secondary | ICD-10-CM | POA: Diagnosis not present

## 2021-10-23 DIAGNOSIS — R42 Dizziness and giddiness: Secondary | ICD-10-CM | POA: Diagnosis not present

## 2021-10-23 DIAGNOSIS — K900 Celiac disease: Secondary | ICD-10-CM | POA: Diagnosis not present

## 2021-10-23 DIAGNOSIS — I700 Atherosclerosis of aorta: Secondary | ICD-10-CM | POA: Diagnosis not present

## 2021-10-23 DIAGNOSIS — E7800 Pure hypercholesterolemia, unspecified: Secondary | ICD-10-CM | POA: Diagnosis not present

## 2021-10-23 DIAGNOSIS — G8929 Other chronic pain: Secondary | ICD-10-CM | POA: Diagnosis not present

## 2021-10-23 DIAGNOSIS — Z471 Aftercare following joint replacement surgery: Secondary | ICD-10-CM | POA: Diagnosis not present

## 2021-10-23 DIAGNOSIS — Z7901 Long term (current) use of anticoagulants: Secondary | ICD-10-CM | POA: Diagnosis not present

## 2021-10-23 DIAGNOSIS — F32A Depression, unspecified: Secondary | ICD-10-CM | POA: Diagnosis not present

## 2021-10-23 DIAGNOSIS — M1030 Gout due to renal impairment, unspecified site: Secondary | ICD-10-CM | POA: Diagnosis not present

## 2021-10-23 DIAGNOSIS — Z7952 Long term (current) use of systemic steroids: Secondary | ICD-10-CM | POA: Diagnosis not present

## 2021-10-23 DIAGNOSIS — M1712 Unilateral primary osteoarthritis, left knee: Secondary | ICD-10-CM | POA: Diagnosis not present

## 2021-10-23 DIAGNOSIS — I82401 Acute embolism and thrombosis of unspecified deep veins of right lower extremity: Secondary | ICD-10-CM | POA: Diagnosis not present

## 2021-10-23 DIAGNOSIS — N183 Chronic kidney disease, stage 3 unspecified: Secondary | ICD-10-CM | POA: Diagnosis not present

## 2021-10-23 DIAGNOSIS — Z9181 History of falling: Secondary | ICD-10-CM | POA: Diagnosis not present

## 2021-10-23 DIAGNOSIS — M48061 Spinal stenosis, lumbar region without neurogenic claudication: Secondary | ICD-10-CM | POA: Diagnosis not present

## 2021-10-23 DIAGNOSIS — K219 Gastro-esophageal reflux disease without esophagitis: Secondary | ICD-10-CM | POA: Diagnosis not present

## 2021-10-23 DIAGNOSIS — Z96651 Presence of right artificial knee joint: Secondary | ICD-10-CM | POA: Diagnosis not present

## 2021-10-28 ENCOUNTER — Telehealth: Payer: Self-pay

## 2021-10-28 NOTE — Telephone Encounter (Signed)
Left pt message to call so we could reschedule her appt for treatment of SCC of R upper pretibia, bx was 03/16/2021

## 2021-10-30 DIAGNOSIS — I129 Hypertensive chronic kidney disease with stage 1 through stage 4 chronic kidney disease, or unspecified chronic kidney disease: Secondary | ICD-10-CM | POA: Diagnosis not present

## 2021-10-30 DIAGNOSIS — I82401 Acute embolism and thrombosis of unspecified deep veins of right lower extremity: Secondary | ICD-10-CM | POA: Diagnosis not present

## 2021-10-30 DIAGNOSIS — I700 Atherosclerosis of aorta: Secondary | ICD-10-CM | POA: Diagnosis not present

## 2021-10-30 DIAGNOSIS — E7800 Pure hypercholesterolemia, unspecified: Secondary | ICD-10-CM | POA: Diagnosis not present

## 2021-10-30 DIAGNOSIS — I8390 Asymptomatic varicose veins of unspecified lower extremity: Secondary | ICD-10-CM | POA: Diagnosis not present

## 2021-10-30 DIAGNOSIS — Z7901 Long term (current) use of anticoagulants: Secondary | ICD-10-CM | POA: Diagnosis not present

## 2021-10-30 DIAGNOSIS — Z96651 Presence of right artificial knee joint: Secondary | ICD-10-CM | POA: Diagnosis not present

## 2021-10-30 DIAGNOSIS — F32A Depression, unspecified: Secondary | ICD-10-CM | POA: Diagnosis not present

## 2021-10-30 DIAGNOSIS — Z471 Aftercare following joint replacement surgery: Secondary | ICD-10-CM | POA: Diagnosis not present

## 2021-10-30 DIAGNOSIS — N183 Chronic kidney disease, stage 3 unspecified: Secondary | ICD-10-CM | POA: Diagnosis not present

## 2021-10-30 DIAGNOSIS — K900 Celiac disease: Secondary | ICD-10-CM | POA: Diagnosis not present

## 2021-10-30 DIAGNOSIS — Z79891 Long term (current) use of opiate analgesic: Secondary | ICD-10-CM | POA: Diagnosis not present

## 2021-10-30 DIAGNOSIS — Z9181 History of falling: Secondary | ICD-10-CM | POA: Diagnosis not present

## 2021-10-30 DIAGNOSIS — K219 Gastro-esophageal reflux disease without esophagitis: Secondary | ICD-10-CM | POA: Diagnosis not present

## 2021-10-30 DIAGNOSIS — I4891 Unspecified atrial fibrillation: Secondary | ICD-10-CM | POA: Diagnosis not present

## 2021-10-30 DIAGNOSIS — Z7952 Long term (current) use of systemic steroids: Secondary | ICD-10-CM | POA: Diagnosis not present

## 2021-10-30 DIAGNOSIS — J45909 Unspecified asthma, uncomplicated: Secondary | ICD-10-CM | POA: Diagnosis not present

## 2021-10-30 DIAGNOSIS — M1030 Gout due to renal impairment, unspecified site: Secondary | ICD-10-CM | POA: Diagnosis not present

## 2021-10-30 DIAGNOSIS — M48061 Spinal stenosis, lumbar region without neurogenic claudication: Secondary | ICD-10-CM | POA: Diagnosis not present

## 2021-10-30 DIAGNOSIS — M1712 Unilateral primary osteoarthritis, left knee: Secondary | ICD-10-CM | POA: Diagnosis not present

## 2021-10-30 DIAGNOSIS — R42 Dizziness and giddiness: Secondary | ICD-10-CM | POA: Diagnosis not present

## 2021-10-30 DIAGNOSIS — G8929 Other chronic pain: Secondary | ICD-10-CM | POA: Diagnosis not present

## 2021-11-17 ENCOUNTER — Other Ambulatory Visit: Payer: Self-pay | Admitting: Internal Medicine

## 2021-11-17 DIAGNOSIS — E785 Hyperlipidemia, unspecified: Secondary | ICD-10-CM

## 2021-12-01 ENCOUNTER — Ambulatory Visit: Payer: Medicare Other | Admitting: Cardiovascular Disease

## 2021-12-09 DIAGNOSIS — Z96651 Presence of right artificial knee joint: Secondary | ICD-10-CM | POA: Diagnosis not present

## 2021-12-14 ENCOUNTER — Encounter: Payer: Self-pay | Admitting: Dermatology

## 2021-12-14 ENCOUNTER — Ambulatory Visit: Payer: Medicare Other | Admitting: Dermatology

## 2021-12-14 DIAGNOSIS — L853 Xerosis cutis: Secondary | ICD-10-CM | POA: Diagnosis not present

## 2021-12-14 DIAGNOSIS — D492 Neoplasm of unspecified behavior of bone, soft tissue, and skin: Secondary | ICD-10-CM

## 2021-12-14 DIAGNOSIS — C4492 Squamous cell carcinoma of skin, unspecified: Secondary | ICD-10-CM

## 2021-12-14 DIAGNOSIS — C44622 Squamous cell carcinoma of skin of right upper limb, including shoulder: Secondary | ICD-10-CM | POA: Diagnosis not present

## 2021-12-14 DIAGNOSIS — C44311 Basal cell carcinoma of skin of nose: Secondary | ICD-10-CM | POA: Diagnosis not present

## 2021-12-14 DIAGNOSIS — C44722 Squamous cell carcinoma of skin of right lower limb, including hip: Secondary | ICD-10-CM | POA: Diagnosis not present

## 2021-12-14 NOTE — Patient Instructions (Addendum)
Wound Care Instructions  Cleanse wound gently with soap and water once a day then pat dry with clean gauze. Apply a thin coat of Petrolatum (petroleum jelly, "Vaseline") over the wound (unless you have an allergy to this). We recommend that you use a new, sterile tube of Vaseline. Do not pick or remove scabs. Do not remove the yellow or white "healing tissue" from the base of the wound.  Cover the wound with fresh, clean, nonstick gauze and secure with paper tape. You may use Band-Aids in place of gauze and tape if the wound is small enough, but would recommend trimming much of the tape off as there is often too much. Sometimes Band-Aids can irritate the skin.  You should call the office for your biopsy report after 1 week if you have not already been contacted.  If you experience any problems, such as abnormal amounts of bleeding, swelling, significant bruising, significant pain, or evidence of infection, please call the office immediately.  FOR ADULT SURGERY PATIENTS: If you need something for pain relief you may take 1 extra strength Tylenol (acetaminophen) AND 2 Ibuprofen ('200mg'$  each) together every 4 hours as needed for pain. (do not take these if you are allergic to them or if you have a reason you should not take them.) Typically, you may only need pain medication for 1 to 3 days.    Recommend starting moisturizer with exfoliant (Urea, Salicylic acid, or Lactic acid) one to two times daily to help smooth rough and bumpy skin.  OTC options include Cetaphil Rough and Bumpy lotion (Urea), Eucerin Roughness Relief lotion or spot treatment cream (Urea), CeraVe SA lotion/cream for Rough and Bumpy skin (Sal Acid), Gold Bond Rough and Bumpy cream (Sal Acid), and AmLactin 12% lotion/cream (Lactic Acid).  If applying in morning, also apply sunscreen to sun-exposed areas, since these exfoliating moisturizers can increase sensitivity to sun.    Gentle Skin Care Guide  1. Bathe no more than once a  day.  2. Avoid bathing in hot water  3. Use a mild soap like Dove, Vanicream, Cetaphil, CeraVe. Can use Lever 2000 or Cetaphil antibacterial soap  4. Use soap only where you need it. On most days, use it under your arms, between your legs, and on your feet. Let the water rinse other areas unless visibly dirty.  5. When you get out of the bath/shower, use a towel to gently blot your skin dry, don't rub it.  6. While your skin is still a little damp, apply a moisturizing cream such as Vanicream, CeraVe, Cetaphil, Eucerin, Sarna lotion or plain Vaseline Jelly. For hands apply Neutrogena [place] Hand Cream or Excipial Hand Cream.  7. Reapply moisturizer any time you start to itch or feel dry.  8. Sometimes using free and clear laundry detergents can be helpful. Fabric softener sheets should be avoided. Downy Free & Gentle liquid, or any liquid fabric softener that is free of dyes and perfumes, it acceptable to use  9. If your doctor has given you prescription creams you may apply moisturizers over them     Due to recent changes in healthcare laws, you may see results of your pathology and/or laboratory studies on MyChart before the doctors have had a chance to review them. We understand that in some cases there may be results that are confusing or concerning to you. Please understand that not all results are received at the same time and often the doctors may need to interpret multiple results in order to  provide you with the best plan of care or course of treatment. Therefore, we ask that you please give [place] 2 business days to thoroughly review all your results before contacting the office for clarification. Should we see a critical lab result, you will be contacted sooner.   If You Need Anything After Your Visit  If you have any questions or concerns for your doctor, please call our main line at [phone] and press option 4 to reach your doctor's medical assistant. If no one answers,  please leave a voicemail as directed and we will return your call as soon as possible. Messages left after 4 pm will be answered the following business day.   You may also send [place] a message via [place]. We typically respond to MyChart messages within 1-2 business days.  For prescription refills, please ask your pharmacy to contact our office. Our fax number is [phone].  If you have an urgent issue when the clinic is closed that cannot wait until the next business day, you can page your doctor at the number below.    Please note that while we do our best to be available for urgent issues outside of office hours, we are not available 24/7.   If you have an urgent issue and are unable to reach [place], you may choose to seek medical care at your doctor's office, retail clinic, urgent care center, or emergency room.  If you have a medical emergency, please immediately call 911 or go to the emergency department.  Pager Numbers  - Dr. Nehemiah Massed: [phone]  - Dr. Laurence Ferrari: [phone]  - Dr. Nicole Kindred: [phone]  In the event of inclement weather, please call our main line at [phone] for an update on the status of any delays or closures.  Dermatology Medication Tips: Please keep the boxes that topical medications come in in order to help keep track of the instructions about where and how to use these. Pharmacies typically print the medication instructions only on the boxes and not directly on the medication tubes.   If your medication is too expensive, please contact our office at [phone] option 4 or send [place] a message through [place].   We are unable to tell what your co-pay for medications will be in advance as this is different depending on your insurance coverage. However, we may be able to find a substitute medication at lower cost or fill out paperwork to get insurance to cover a needed medication.   If a prior authorization is required to get your medication covered by your  insurance company, please allow [place] 1-2 business days to complete this process.  Drug prices often vary depending on where the prescription is filled and some pharmacies may offer cheaper prices.  The website www.goodrx.com contains coupons for medications through different pharmacies. The prices here do not account for what the cost may be with help from insurance (it may be cheaper with your insurance), but the website can give you the price if you did not use any insurance.  - You can print the associated coupon and take it with your prescription to the pharmacy.  - You may also stop by our office during regular business hours and pick up a GoodRx coupon card.  - If you need your prescription sent electronically to a different pharmacy, notify our office through [facility] or by phone at [phone] option 4.     Si Usted Necesita Algo Despus de Su Visita  Tambin puede enviarnos un mensaje  a travs de MyChart. Por lo general respondemos a los mensajes de MyChart en el transcurso de 1 a 2 das hbiles.  Para renovar recetas, por favor pida a su farmacia que se ponga en contacto con nuestra oficina. Harland Dingwall de fax es [place] [phone].  Si tiene un asunto urgente cuando la clnica est cerrada y que no puede esperar hasta el siguiente da hbil, puede llamar/localizar a su doctor(a) al nmero que aparece a continuacin.   Por favor, tenga en cuenta que aunque hacemos todo lo posible para estar disponibles para asuntos urgentes fuera del horario de [place], no estamos disponibles las 24 horas del da, los 7 das de la [place].   Si tiene un problema urgente y no puede comunicarse con nosotros, puede optar por buscar atencin mdica  en el consultorio de su doctor(a), en una clnica privada, en un centro de atencin urgente o en una sala de emergencias.  Si tiene Engineering geologist, por favor llame inmediatamente al 911 o vaya a la sala de emergencias.  Nmeros de  bper  - Dr. Nehemiah Massed: [phone]  - Dra. Moye: [phone]  - Dra. Nicole Kindred: [phone]  En caso de inclemencias del [place], por favor llame a Johnsie Kindred principal al [phone] para una actualizacin sobre el [place] de cualquier retraso o cierre.  Consejos para la medicacin en dermatologa: Por favor, guarde las cajas en las que vienen los medicamentos de uso tpico para ayudarle a seguir las instrucciones sobre dnde y cmo usarlos. Las farmacias generalmente imprimen las instrucciones del medicamento slo en las cajas y no directamente en los tubos del [place].   Si su medicamento es muy caro, por favor, pngase en contacto con Zigmund Daniel llamando al [phone] y presione la opcin 4 o envenos un mensaje a travs de Pharmacist, community.   No podemos decirle cul ser su copago por los medicamentos por adelantado ya que esto es diferente dependiendo de la cobertura de su seguro. Sin embargo, es posible que podamos encontrar un medicamento sustituto a Electrical engineer un formulario para que el seguro cubra el medicamento que se considera necesario.   Si se requiere una autorizacin previa para que su compaa de seguros [place] su medicamento, por favor permtanos de 1 a 2 das hbiles para completar este proceso.  Los precios de los medicamentos varan con frecuencia dependiendo del Environmental consultant de dnde se surte la receta y alguna farmacias pueden ofrecer precios ms baratos.  El sitio web www.goodrx.com tiene cupones para medicamentos de Airline pilot. Los precios aqu no tienen en cuenta lo que podra costar con la ayuda del seguro (puede ser ms barato con su seguro), pero el sitio web puede darle el precio si no utiliz Research scientist (physical sciences).  - Puede imprimir el cupn correspondiente y llevarlo con su receta a la farmacia.  - Tambin puede pasar por nuestra oficina durante el horario de atencin regular y Charity fundraiser una tarjeta de cupones de GoodRx.  - Si necesita que su receta se  enve electrnicamente a una farmacia diferente, informe a nuestra oficina a travs de MyChart de [place] o por telfono llamando al [phone] y presione la opcin 4.

## 2021-12-14 NOTE — Progress Notes (Signed)
Follow-Up Visit   Subjective  Jamie Anderson is a 86 y.o. female who presents for the following: SCC bx proven (R upper pretibia, pt presents for [facility] today) and check spots (R hand, nose, >73m scaly, hx of bleeding in past on r hand). And nose.   The following portions of the chart were reviewed this encounter and updated as appropriate:       Review of Systems:  No other skin or systemic complaints except as noted in HPI or Assessment and Plan.  Objective  Well appearing patient in no apparent distress; mood and affect are within normal limits.  A focused examination was performed including right lower leg, r hand, face. Relevant physical exam findings are noted in the Assessment and Plan.  Right Lower Leg - Anterior 0.7 x 0.5cm pink bx site  Right Lower Leg - Anterior Xerosis legs  R thenar hand dorsum 6.029mscaly pap     L nasal dorsum 5.49m53mink scaly pearly macule         Assessment & Plan  Squamous cell carcinoma of skin Right Lower Leg - Anterior  Destruction of lesion  Destruction method: electrodesiccation and curettage   Informed consent: discussed and consent obtained   Timeout:  patient name, date of birth, surgical site, and procedure verified Anesthesia: the lesion was anesthetized in a standard fashion   Anesthetic:  1% lidocaine w/ epinephrine 1-100,000 buffered w/ 8.4% NaHCO3 Curettage performed in three different directions: Yes   Electrodesiccation performed over the curetted area: Yes   Final wound size (cm):  1.1 Hemostasis achieved with:  pressure, aluminum chloride and electrodesiccation Outcome: patient tolerated procedure well with no complications   Post-procedure details: wound care instructions given   Additional details:  Post txt defect 1.1 x 0.6cm  Bx proven SCC EDC today  Xerosis cutis Right Lower Leg - Anterior  Recommend Gentle skin care, mild soap and moisturizers  Recommend starting moisturizer with exfoliant  (Urea, Salicylic acid, or Lactic acid) one to two times daily to help smooth rough and bumpy skin.  OTC options include Cetaphil Rough and Bumpy lotion (Urea), Eucerin Roughness Relief lotion or spot treatment cream (Urea), CeraVe SA lotion/cream for Rough and Bumpy skin (Sal Acid), Gold Bond Rough and Bumpy cream (Sal Acid), and AmLactin 12% lotion/cream (Lactic Acid).  If applying in morning, also apply sunscreen to sun-exposed areas, since these exfoliating moisturizers can increase sensitivity to sun.   Neoplasm of skin (2) R thenar hand dorsum  Skin / nail biopsy Type of biopsy: tangential   Informed consent: discussed and consent obtained   Anesthesia: the lesion was anesthetized in a standard fashion   Anesthesia comment:  Area prepped with alcohol Anesthetic:  1% lidocaine w/ epinephrine 1-100,000 buffered w/ 8.4% NaHCO3 Instrument used: flexible razor blade   Hemostasis achieved with: pressure, aluminum chloride and electrodesiccation   Outcome: patient tolerated procedure well    Destruction of lesion  Destruction method: electrodesiccation and curettage   Informed consent: discussed and consent obtained   Timeout:  patient name, date of birth, surgical site, and procedure verified Anesthesia: the lesion was anesthetized in a standard fashion   Anesthetic:  1% lidocaine w/ epinephrine 1-100,000 buffered w/ 8.4% NaHCO3 Curettage performed in three different directions: Yes   Electrodesiccation performed over the curetted area: Yes   Final wound size (cm):  0.8 Hemostasis achieved with:  pressure, aluminum chloride and electrodesiccation Outcome: patient tolerated procedure well with no complications   Post-procedure details: wound  care instructions given   Additional details:  Post txt defect 0.8cm, Mupirocin ointment and Bandaid applied   Specimen 1 - Surgical pathology Differential Diagnosis: D48.5 Hypertrophic Ak r/o SCC  Check Margins: No 6.45m scaly pap EDC today  L  nasal dorsum  Skin / nail biopsy Type of biopsy: tangential   Informed consent: discussed and consent obtained   Anesthesia: the lesion was anesthetized in a standard fashion   Anesthesia comment:  Area prepped with alcohol Anesthetic:  1% lidocaine w/ epinephrine 1-100,000 buffered w/ 8.4% NaHCO3 Instrument used: flexible razor blade   Hemostasis achieved with: pressure, aluminum chloride and electrodesiccation   Outcome: patient tolerated procedure well   Post-procedure details: wound care instructions given   Post-procedure details comment:  Ointment and small bandage applied  Specimen 2 - Surgical pathology Differential Diagnosis: D48.5 R/O BCC  Check Margins: No 5.023mpink scaly pearly macule  L thenar hand r/o SCC bx and EDC today  L nasal dorusm r/o BCC, bx today, if BCC [facility] Damage - chronic, secondary to cumulative UV radiation exposure/sun exposure over time - diffuse scaly erythematous macules with underlying dyspigmentation - Recommend daily broad spectrum sunscreen SPF 30+ to sun-exposed areas, reapply every 2 hours as needed.  - Recommend staying in the shade or wearing long sleeves, sun glasses (UVA+UVB protection) and wide brim hats (4-inch brim around the entire circumference of the hat). - Call for new or changing lesions.   Return in about 6 months (around 06/15/2022) for recheck SCC and bx sites.  I, SoOthelia PullingRMA, am acting as scribe for TaBrendolyn PattyMD .  Documentation: I have reviewed the above documentation for accuracy and completeness, and I agree with the above.  TaBrendolyn PattyD

## 2021-12-21 ENCOUNTER — Telehealth: Payer: Self-pay

## 2021-12-21 DIAGNOSIS — C44311 Basal cell carcinoma of skin of nose: Secondary | ICD-10-CM

## 2021-12-21 NOTE — Progress Notes (Unsigned)
Cardiology Office Note  Date:  12/22/2021   ID:  [name], DOB Jul 15, 1935, MRN [id number]  PCP:  Crecencio Mc, MD   Chief Complaint  Patient presents with   3 month follow up     Patient c/o shortness of breath with over exertion. Medications reviewed by the patient verbally.     HPI:  [name] is a pleasant 86 year old woman with past medical history of Hypertension Hyperlipidemia Chronic back pain, hx of back surgery Aortic atherosclerosis, coronary calcification on CT Afib noted during her hospitalization in February 2023 following right knee surgery. rhythm strips from February 26, that do show rapid A-fib in the 130s surgery.Who presents for follow-up of her chest pain ,  paroxysmal atrial fibrillation  Last clinic visit with myself March 2023 Recovered from knee surgery February 2023 Treated for right DVT on Eliquis Has remained on Eliquis given history of paroxysmal atrial fibrillation A-fib noted on rhythm strips while she was in the hospital February 2023  Recent imaging reviewed with her in detail Echo 7/23 EF normal  CTA 5/23 which showed a 25 to 49% proximal LAD stenosis and less than 25% left main and LCX plaque.    Spent several weeks at [facility] Active, no regular exercise program Reports having some dizziness in the past Prior history of chronic back pain, has had cortisone shots   Lab work reviewed Total chol 163, LDL 68 On statin with Zetia   nonsmoker nondiabetetic  EKG personally reviewed by myself on todays visit Shows normal sinus rhythm rate 80 bpm no significant ST or T wave changes   PMH:   has a past medical history of Arthritis, Asthma, Basal cell carcinoma (01/17/2018), Basal cell carcinoma (12/14/2021), Cystic teratoma of right ovary (67/89/3810), Diastolic dysfunction, Dyspnea, GERD (gastroesophageal reflux disease), Granuloma annulare, Hyperlipidemia, Hypertension, Menopause, Non-obstructive CAD (coronary artery  disease), PAF (paroxysmal atrial fibrillation) ([place]), Plantar fasciitis of right foot (12/24/2014), Right knee DJD (04/03/2012), SCC (squamous cell carcinoma) (10/08/2020), SCC (squamous cell carcinoma) (03/16/2021), Squamous cell carcinoma of skin (02/26/2020), Squamous cell carcinoma of skin (07/15/2020), Squamous cell carcinoma of skin (12/09/2020), and Squamous cell carcinoma of skin (12/14/2021).  PSH:    Past Surgical History:  Procedure Laterality Date   APPENDECTOMY     CATARACT EXTRACTION, BILATERAL     DILATION AND CURETTAGE OF UTERUS     RIGHT OOPHORECTOMY Right 04/08/2012   [name]   SPINE SURGERY  03/08/2008   TOTAL KNEE ARTHROPLASTY Right 04/29/2021   Procedure: TOTAL KNEE ARTHROPLASTY;  Surgeon: Lovell Sheehan, MD;  Location: [facility] ORS;  Service: Orthopedics;  Laterality: Right;   VENOUS ABLATION     bilateral    Current Outpatient Medications  Medication Sig Dispense Refill   acetaminophen (TYLENOL) 325 MG tablet Take 1-2 tablets (325-650 mg total) by mouth every 6 (six) hours as needed for mild pain (pain score 1-3 or temp > 100.5). 40 tablet 1   albuterol (VENTOLIN HFA) 108 (90 Base) MCG/ACT inhaler Inhale 1 puff into the lungs every 6 (six) hours as needed for wheezing or shortness of breath. 1 each 0   allopurinol (ZYLOPRIM) 100 MG tablet TAKE 1 TABLET BY MOUTH  DAILY 90 tablet 3   apixaban (ELIQUIS) 5 MG TABS tablet Take 1 tablet (5 mg total) by mouth 2 (two) times daily. 60 tablet 5   calcitRIOL (ROCALTROL) 0.25 MCG capsule Take 0.25 mcg by mouth daily.     calcium carbonate (TUMS - DOSED IN MG ELEMENTAL CALCIUM)  500 MG chewable tablet Chew 1 tablet by mouth daily as needed for indigestion or heartburn.     cetirizine (ZYRTEC) 10 MG tablet Take 10 mg by mouth daily as needed for allergies.     clobetasol cream (TEMOVATE) 0.05 % Apply to itchy rash on back 1-2 times a day until improved. Avoid face, groin, axilla. 30 g 1   conjugated estrogens (PREMARIN) vaginal cream  Place 1 applicator vaginally as needed.     cyanocobalamin (,VITAMIN B-12,) 1000 MCG/ML injection Inject 1 mL (1,000 mcg total) into the muscle every 14 (fourteen) days. 1 mL 1   ezetimibe (ZETIA) 10 MG tablet TAKE 1 TABLET BY MOUTH DAILY 90 tablet 3   fluocinonide cream (LIDEX) 0.93 % Apply 1 application. topically 2 (two) times daily as needed (itching).     fluticasone (FLONASE) 50 MCG/ACT nasal spray Place 2 sprays into both nostrils daily as needed for allergies or rhinitis.     fluticasone-salmeterol (ADVAIR) 100-50 MCG/ACT AEPB Inhale 1 puff into the lungs 2 (two) times daily as needed.     HYDROcodone-acetaminophen (NORCO/VICODIN) 5-325 MG tablet      losartan (COZAAR) 100 MG tablet Take 1 tablet (100 mg total) by mouth daily. 90 tablet 3   meclizine (ANTIVERT) 25 MG tablet Take 1 tablet (25 mg total) by mouth 2 (two) times daily. 180 tablet 3   metoprolol succinate (TOPROL-XL) 25 MG 24 hr tablet Take 1 tablet (25 mg total) by mouth at bedtime. 90 tablet 3   pantoprazole (PROTONIX) 40 MG tablet TAKE 1 TABLET BY MOUTH DAILY 90 tablet 3   sertraline (ZOLOFT) 100 MG tablet TAKE 1 TABLET BY MOUTH  DAILY 90 tablet 3   simvastatin (ZOCOR) 40 MG tablet TAKE 1 TABLET BY MOUTH AT  BEDTIME 90 tablet 3   traMADol (ULTRAM) 50 MG tablet Take 50 mg by mouth every 6 (six) hours as needed.     VITAMIN D PO Take 1,000 Int'l Units/day by mouth daily.     predniSONE (DELTASONE) 10 MG tablet 6 tablets on Day 1 , then reduce by 1 tablet daily until gone (Patient not taking: Reported on 09/21/2021) 21 tablet 0   No current facility-administered medications for this visit.     Allergies:   Clarithromycin, Colloidal oatmeal, Dust mite extract, Gluten meal, Oat, Other, and Shrimp [shellfish allergy]   Social History:  The patient  reports that she has never smoked. She has never used smokeless tobacco. She reports that she does not drink alcohol and does not use drugs.   Family History:   family history  includes Breast cancer (age of onset: 56) in her sister; Cancer in her brother; Early death in her mother; Heart attack in her father; Heart disease in her brother, father, and sister.    Review of Systems: Review of Systems  Constitutional: Negative.   Eyes: Negative.   Respiratory: Negative.    Cardiovascular: Negative.   Gastrointestinal: Negative.   Genitourinary: Negative.   Musculoskeletal:  Positive for back pain and neck pain.  Neurological: Negative.   Psychiatric/Behavioral: Negative.    All other systems reviewed and are negative.   PHYSICAL EXAM: VS:  BP (!) 144/60 (BP Location: Left Arm, Patient Position: Sitting, Cuff Size: Normal)   Pulse 80   Ht '5\' 4"'$  (1.626 m)   Wt 187 lb (84.8 kg)   SpO2 95%   BMI 32.10 kg/m  , BMI Body mass index is 32.1 kg/m. Constitutional:  oriented to person, place, and time.  No distress.  HENT:  Head: Grossly normal Eyes:  no discharge. No scleral icterus.  Neck: No JVD, no carotid bruits  Cardiovascular: Regular rate and rhythm, no murmurs appreciated Pulmonary/Chest: Clear to auscultation bilaterally, no wheezes or rails Abdominal: Soft.  no distension.  no tenderness.  Musculoskeletal: Normal range of motion Neurological:  normal muscle tone. Coordination normal. No atrophy Skin: Skin warm and dry Psychiatric: normal affect, pleasant  Recent Labs: 05/03/2021: B Natriuretic Peptide 223.0; TSH 0.947 06/08/2021: BUN 14; Creatinine, Ser 0.97; Hemoglobin 9.7; Platelets 281.0; Potassium 4.3; Sodium 141 07/17/2021: ALT 10    Lipid Panel Lab Results  Component Value Date   CHOL 137 07/17/2021   HDL 65.60 07/17/2021   LDLCALC 50 07/17/2021   TRIG 108.0 07/17/2021    Wt Readings from Last 3 Encounters:  12/22/21 187 lb (84.8 kg)  09/21/21 177 lb 2 oz (80.3 kg)  08/24/21 176 lb (79.8 kg)     ASSESSMENT AND PLAN:  Atypical chest pain  No recent chest pain symptoms concerning for angina  Aortic atherosclerosis (HCC)   Moderate aortic atherosclerosis Cholesterol remains at goal  Essential hypertension Blood pressure borderline elevated, recommend she closely monitor blood pressure at home and call us if this continues to run high No changes made to her medications  Hyperlipidemia Continue statin with Zetia Numbers at goal  Shortness of breath Recommended weight loss, low carbohydrate diet, regular walking program for conditioning  Paroxysmal atrial fibrillation Noted perioperatively on telemetry strips in the hospital February 2023 on Eliquis  Total encounter time more than 30 minutes Greater than 50% was spent in counseling and coordination of care with the patient   Signed, Esmond Plants, M.D., Ph.D. 12/22/2021  [place], [place]

## 2021-12-21 NOTE — Telephone Encounter (Signed)
Advised patient biopsy of the right thenar hand dorsum was SCC and was treated with EDC. Left nasal dorsum was BCC and will refer for Mohs at [facility].

## 2021-12-21 NOTE — Telephone Encounter (Signed)
-----   Message from Brendolyn Patty, MD sent at 12/21/2021 12:37 PM EDT ----- 1. Skin , right thenar hand dorsum WELL DIFFERENTIATED SQUAMOUS CELL CARCINOMA 2. Skin , left nasal dorsum BASAL CELL CARCINOMA, NODULAR AND INFILTRATIVE PATTERNS  1. SCC skin cancer- already treated with EDC at time of biopsy 2. BCC skin cancer- she needs referral to [facility] for Mohs surgery   - please call patient

## 2021-12-22 ENCOUNTER — Encounter: Payer: Self-pay | Admitting: Cardiovascular Disease

## 2021-12-22 ENCOUNTER — Ambulatory Visit: Payer: Medicare Other | Attending: Cardiovascular Disease | Admitting: Cardiovascular Disease

## 2021-12-22 VITALS — BP 144/60 | HR 80 | Ht 64.0 in | Wt 187.0 lb

## 2021-12-22 DIAGNOSIS — R072 Precordial pain: Secondary | ICD-10-CM

## 2021-12-22 DIAGNOSIS — I700 Atherosclerosis of aorta: Secondary | ICD-10-CM

## 2021-12-22 DIAGNOSIS — I2510 Atherosclerotic heart disease of native coronary artery without angina pectoris: Secondary | ICD-10-CM

## 2021-12-22 DIAGNOSIS — I82461 Acute embolism and thrombosis of right calf muscular vein: Secondary | ICD-10-CM

## 2021-12-22 DIAGNOSIS — I2584 Coronary atherosclerosis due to calcified coronary lesion: Secondary | ICD-10-CM

## 2021-12-22 DIAGNOSIS — E782 Mixed hyperlipidemia: Secondary | ICD-10-CM

## 2021-12-22 DIAGNOSIS — E785 Hyperlipidemia, unspecified: Secondary | ICD-10-CM | POA: Diagnosis not present

## 2021-12-22 DIAGNOSIS — I10 Essential (primary) hypertension: Secondary | ICD-10-CM | POA: Diagnosis not present

## 2021-12-22 DIAGNOSIS — I480 Paroxysmal atrial fibrillation: Secondary | ICD-10-CM

## 2021-12-22 NOTE — Patient Instructions (Addendum)
Medication Instructions:  No changes  If you need a refill on your cardiac medications before your next appointment, please call your pharmacy.   Lab work: No new labs needed  Testing/Procedures: No new testing needed  Follow-Up: At [facility], you and your health needs are our priority.  As part of our continuing mission to provide you with exceptional heart care, we have created designated Provider Care Teams.  These Care Teams include your primary Cardiologist (physician) and Advanced Practice Providers (APPs -  Physician Assistants and Nurse Practitioners) who all work together to provide you with the care you need, when you need it.  You will need a follow up appointment in 12 months  Providers on your designated Care Team:   Murray Hodgkins, NP Christell Faith, PA-C Cadence Kathlen Mody, [place]  COVID-19 Vaccine Information can be found at: [email or web address] For questions related to vaccine distribution or appointments, please email [email or web address] or call [phone].  Thank you desire appreciated thank you appreciated old thank you very much I have a good night

## 2021-12-29 ENCOUNTER — Telehealth: Payer: Self-pay

## 2021-12-29 DIAGNOSIS — L814 Other melanin hyperpigmentation: Secondary | ICD-10-CM | POA: Diagnosis not present

## 2021-12-29 DIAGNOSIS — L988 Other specified disorders of the skin and subcutaneous tissue: Secondary | ICD-10-CM | POA: Diagnosis not present

## 2021-12-29 DIAGNOSIS — C44311 Basal cell carcinoma of skin of nose: Secondary | ICD-10-CM | POA: Diagnosis not present

## 2021-12-29 DIAGNOSIS — C44622 Squamous cell carcinoma of skin of right upper limb, including shoulder: Secondary | ICD-10-CM | POA: Diagnosis not present

## 2021-12-29 DIAGNOSIS — L578 Other skin changes due to chronic exposure to nonionizing radiation: Secondary | ICD-10-CM | POA: Diagnosis not present

## 2021-12-29 NOTE — Telephone Encounter (Signed)
Encounter notes from [facility] scanned in under the media tab for you review.

## 2022-01-07 ENCOUNTER — Encounter: Payer: Self-pay | Admitting: Adult Health

## 2022-01-07 ENCOUNTER — Ambulatory Visit: Payer: Medicare Other | Admitting: Adult Health

## 2022-01-07 VITALS — BP 136/74 | HR 87 | Temp 97.9°F | Ht 64.0 in | Wt 188.4 lb

## 2022-01-07 DIAGNOSIS — J42 Unspecified chronic bronchitis: Secondary | ICD-10-CM | POA: Diagnosis not present

## 2022-01-07 DIAGNOSIS — R062 Wheezing: Secondary | ICD-10-CM

## 2022-01-07 DIAGNOSIS — J4520 Mild intermittent asthma, uncomplicated: Secondary | ICD-10-CM | POA: Diagnosis not present

## 2022-01-07 MED ORDER — FLUTICASONE-SALMETEROL 100-50 MCG/ACT IN AEPB: 1.0000 | INHALATION_SPRAY | Freq: Two times a day (BID) | RESPIRATORY_TRACT | 1 refills | Status: DC | PRN

## 2022-01-07 MED ORDER — ALBUTEROL SULFATE HFA 108 (90 BASE) MCG/ACT IN AERS: 1.0000 | INHALATION_SPRAY | Freq: Four times a day (QID) | RESPIRATORY_TRACT | 1 refills | Status: DC | PRN

## 2022-01-07 NOTE — Progress Notes (Signed)
$'@Patient'b$  ID: Jamie Anderson, female    DOB: 11/05/1935, 86 y.o.   MRN: [id number]  Chief Complaint  Patient presents with   Follow-up    Referring provider: Crecencio Mc, MD  HPI: 86 year old female never smoker followed for chronic bronchitis and bronchiectasis  TEST/EVENTS :  Spirometry 09/15/2017 - Mild Obstructive Airways Disease without Significant Broncho-Dilator Response   CT chest May 03, 2021 shows clear lungs, mild pleural and parenchymal scarring in the lung apices  01/07/2022 Follow up: Chronic Bronchitis and Bronchiectasis  Patient returns for a follow-up visit.  She has underlying chronic bronchitis.  Remains on Advair twice daily.  Patient says overall breathing has been doing well.  She denies any increased albuterol use.  Does have a flutter valve that she uses if her cough increases.  She denies any fever, discolored mucus or weight loss. Active some at home. Does not exercise.   Flu shot utd   Had knee replacement in Feb 2620 , complicated post op with A Fib and DVT .  Now on Eliquis. Doing better now .    Allergies  Allergen Reactions   Clarithromycin Other (See Comments)    Unknown   Colloidal Oatmeal Other (See Comments)    Unknown reaction   Dust Mite Extract Other (See Comments)   Gluten Meal Other (See Comments)    Unknown   Oat     Unknown reaction   Other     Almonds - unknown reaction Mold Spores   Shrimp [Shellfish Allergy]     Unknown reaction     Immunization History  Administered Date(s) Administered   Influenza Split 11/25/2011, 12/23/2021   Influenza, High Dose Seasonal PF 01/30/2018   Influenza,inj,Quad PF,6+ Mos 11/22/2012   Influenza,inj,quad, With Preservative 12/07/2018   Influenza-Unspecified 12/20/2013, 01/07/2015, 01/09/2016, 12/15/2016, 12/06/2019, 01/15/2021   PFIZER(Purple Top)SARS-COV-2 Vaccination 04/28/2019, 05/23/2019, 01/01/2020, 07/31/2020   Pneumococcal Conjugate,unspecified 11/07/2013   Pneumococcal  Conjugate-13 01/03/2014   Pneumococcal Polysaccharide-23 11/25/2011, 02/09/2018   Tdap 12/06/2013   Zoster, Live 12/29/2009    Past Medical History:  Diagnosis Date   Arthritis    Asthma    Basal cell carcinoma 01/17/2018   Left chest. Infiltrative. Excised 02/06/2018, margins free.   Basal cell carcinoma 12/14/2021   L nasal dorsum, Refer for Mohs [facility].   Cystic teratoma of right ovary 11/23/2012   She underwent right salpingo-oophorectomy on April 1 by Dr. Kenton Kingfisher for removal of what turned out to be a cystic teratoma. Hysteroscopy was also done at the time due to postmenopausal bleeding. It is unclear  why the left ovary was not taken out since she is postmenopausal.    Diastolic dysfunction    a. 09/2021 Echo: EF 55-60%, no rwma, GrII DD, nl RV fxn, mild MR, triv AI.   Dyspnea    GERD (gastroesophageal reflux disease)    Granuloma annulare    Hyperlipidemia    Hypertension    Menopause    Non-obstructive CAD (coronary artery disease)    a. 07/2021 Cor CTA: LM <25, LAD 25-49p, LCX <25p, RCA nl. Ca2+ = 390 (71st%'ile).   PAF (paroxysmal atrial fibrillation) ([place])    a. 04/2021 - occured following R knee surgery.  CHA2DS2VASc = 5--> Eliquis.   Plantar fasciitis of right foot 12/24/2014   Right knee DJD 04/03/2012   SCC (squamous cell carcinoma) 10/08/2020   R mid lower pretibia, [facility]   SCC (squamous cell carcinoma) 03/16/2021   R upper pretibial, [facility] 12/14/2021   Squamous cell  carcinoma of skin 02/26/2020   R lower pretibial, clear with bx 04/01/20   Squamous cell carcinoma of skin 07/15/2020   R mid pretibia, [facility]   Squamous cell carcinoma of skin 12/09/2020   R lateral pretibia, [facility]   Squamous cell carcinoma of skin 12/14/2021   R thenar hand dorsum, [facility]    Tobacco History: Social History   Tobacco Use  Smoking Status Never  Smokeless Tobacco Never   Counseling given: Not Answered   Outpatient Medications Prior to Visit  Medication Sig Dispense Refill   acetaminophen  (TYLENOL) 325 MG tablet Take 1-2 tablets (325-650 mg total) by mouth every 6 (six) hours as needed for mild pain (pain score 1-3 or temp > 100.5). 40 tablet 1   allopurinol (ZYLOPRIM) 100 MG tablet TAKE 1 TABLET BY MOUTH  DAILY 90 tablet 3   apixaban (ELIQUIS) 5 MG TABS tablet Take 1 tablet (5 mg total) by mouth 2 (two) times daily. 60 tablet 5   calcitRIOL (ROCALTROL) 0.25 MCG capsule Take 0.25 mcg by mouth daily.     calcium carbonate (TUMS - DOSED IN MG ELEMENTAL CALCIUM) 500 MG chewable tablet Chew 1 tablet by mouth daily as needed for indigestion or heartburn.     cetirizine (ZYRTEC) 10 MG tablet Take 10 mg by mouth daily as needed for allergies.     clobetasol cream (TEMOVATE) 0.05 % Apply to itchy rash on back 1-2 times a day until improved. Avoid face, groin, axilla. 30 g 1   conjugated estrogens (PREMARIN) vaginal cream Place 1 applicator vaginally as needed.     cyanocobalamin (,VITAMIN B-12,) 1000 MCG/ML injection Inject 1 mL (1,000 mcg total) into the muscle every 14 (fourteen) days. 1 mL 1   ezetimibe (ZETIA) 10 MG tablet TAKE 1 TABLET BY MOUTH DAILY 90 tablet 3   fluocinonide cream (LIDEX) 4.40 % Apply 1 application. topically 2 (two) times daily as needed (itching).     fluticasone (FLONASE) 50 MCG/ACT nasal spray Place 2 sprays into both nostrils daily as needed for allergies or rhinitis.     HYDROcodone-acetaminophen (NORCO/VICODIN) 5-325 MG tablet      losartan (COZAAR) 100 MG tablet Take 1 tablet (100 mg total) by mouth daily. 90 tablet 3   meclizine (ANTIVERT) 25 MG tablet Take 1 tablet (25 mg total) by mouth 2 (two) times daily. 180 tablet 3   metoprolol succinate (TOPROL-XL) 25 MG 24 hr tablet Take 1 tablet (25 mg total) by mouth at bedtime. 90 tablet 3   pantoprazole (PROTONIX) 40 MG tablet TAKE 1 TABLET BY MOUTH DAILY 90 tablet 3   sertraline (ZOLOFT) 100 MG tablet TAKE 1 TABLET BY MOUTH  DAILY 90 tablet 3   simvastatin (ZOCOR) 40 MG tablet TAKE 1 TABLET BY MOUTH AT  BEDTIME  90 tablet 3   VITAMIN D PO Take 1,000 Int'l Units/day by mouth daily.     albuterol (VENTOLIN HFA) 108 (90 Base) MCG/ACT inhaler Inhale 1 puff into the lungs every 6 (six) hours as needed for wheezing or shortness of breath. 1 each 0   fluticasone-salmeterol (ADVAIR) 100-50 MCG/ACT AEPB Inhale 1 puff into the lungs 2 (two) times daily as needed.     predniSONE (DELTASONE) 10 MG tablet 6 tablets on Day 1 , then reduce by 1 tablet daily until gone 21 tablet 0   traMADol (ULTRAM) 50 MG tablet Take 50 mg by mouth every 6 (six) hours as needed. (Patient not taking: Reported on 01/07/2022)     No  facility-administered medications prior to visit.     Review of Systems:   Constitutional:   No  weight loss, night sweats,  Fevers, chills,  +fatigue, or  lassitude.  HEENT:   No headaches,  Difficulty swallowing,  Tooth/dental problems, or  Sore throat,                No sneezing, itching, ear ache, nasal congestion, post nasal drip,   CV:  No chest pain,  Orthopnea, PND, swelling in lower extremities, anasarca, dizziness, palpitations, syncope.   GI  No heartburn, indigestion, abdominal pain, nausea, vomiting, diarrhea, change in bowel habits, loss of appetite, bloody stools.   Resp:   No chest wall deformity  Skin: no rash or lesions.  GU: no dysuria, change in color of urine, no urgency or frequency.  No flank pain, no hematuria   MS:  No joint pain or swelling.  No decreased range of motion.  No back pain.    Physical Exam  BP 136/74 (BP Location: Left Arm, Cuff Size: Normal)   Pulse 87   Temp 97.9 F (36.6 C) (Temporal)   Ht '5\' 4"'$  (1.626 m)   Wt 188 lb 6.4 oz (85.5 kg)   SpO2 98%   BMI 32.34 kg/m   GEN: A/Ox3; pleasant , NAD, well nourished    HEENT:  [place]/AT,   NOSE-clear, THROAT-clear, no lesions, no postnasal drip or exudate noted.   NECK:  Supple w/ fair ROM; no JVD; normal carotid impulses w/o bruits; no thyromegaly or nodules palpated; no lymphadenopathy.    RESP  Clear   P & A; w/o, wheezes/ rales/ or rhonchi. no accessory muscle use, no dullness to percussion  CARD:  RRR, no m/r/g, no peripheral edema, pulses intact, no cyanosis or clubbing.  GI:   Soft & nt; nml bowel sounds; no organomegaly or masses detected.   Musco: Warm bil, no deformities or joint swelling noted.   Neuro: alert, no focal deficits noted.    Skin: Warm, no lesions or rashes    Lab Results:  CBC       ProBNP No results found for: "PROBNP"  Imaging: No results found.        No data to display          No results found for: "NITRICOXIDE"      Assessment & Plan:   Chronic bronchitis (HCC) Chronic bronchitis.  Currently under good control.  Continue on Advair twice daily.  Flutter valve as needed.  Activity as tolerated  Plan  Patient Instructions  Continue on Advair Twice daily  , rinse after use.  Albuterol inhaler As needed   Activity as tolerated.  Flutter valve Twice daily  As needed  Follow up with Dr. Mortimer Fries in 6 months and As needed        Rexene Edison, NP 01/07/2022

## 2022-01-07 NOTE — Assessment & Plan Note (Signed)
Chronic bronchitis.  Currently under good control.  Continue on Advair twice daily.  Flutter valve as needed.  Activity as tolerated  Plan  Patient Instructions  Continue on Advair Twice daily  , rinse after use.  Albuterol inhaler As needed   Activity as tolerated.  Flutter valve Twice daily  As needed  Follow up with Dr. Mortimer Fries in 6 months and As needed

## 2022-01-07 NOTE — Patient Instructions (Signed)
Continue on Advair Twice daily  , rinse after use.  Albuterol inhaler As needed   Activity as tolerated.  Flutter valve Twice daily  As needed  Follow up with Dr. Mortimer Fries in 6 months and As needed

## 2022-01-11 ENCOUNTER — Telehealth: Payer: Self-pay

## 2022-01-11 ENCOUNTER — Other Ambulatory Visit: Payer: Self-pay | Admitting: Internal Medicine

## 2022-01-11 DIAGNOSIS — Z1231 Encounter for screening mammogram for malignant neoplasm of breast: Secondary | ICD-10-CM

## 2022-01-11 NOTE — Telephone Encounter (Signed)
[facility] encounter notes scanned in under media tab for your review.

## 2022-01-15 ENCOUNTER — Other Ambulatory Visit (HOSPITAL_COMMUNITY): Payer: Self-pay

## 2022-01-18 ENCOUNTER — Encounter: Payer: Self-pay | Admitting: Internal Medicine

## 2022-01-18 ENCOUNTER — Ambulatory Visit (INDEPENDENT_AMBULATORY_CARE_PROVIDER_SITE_OTHER): Payer: Medicare Other | Admitting: Internal Medicine

## 2022-01-18 VITALS — BP 148/80 | HR 72 | Temp 98.1°F | Ht 64.0 in | Wt 187.6 lb

## 2022-01-18 DIAGNOSIS — E785 Hyperlipidemia, unspecified: Secondary | ICD-10-CM

## 2022-01-18 DIAGNOSIS — I10 Essential (primary) hypertension: Secondary | ICD-10-CM | POA: Diagnosis not present

## 2022-01-18 DIAGNOSIS — J479 Bronchiectasis, uncomplicated: Secondary | ICD-10-CM

## 2022-01-18 DIAGNOSIS — N1831 Chronic kidney disease, stage 3a: Secondary | ICD-10-CM | POA: Diagnosis not present

## 2022-01-18 DIAGNOSIS — E538 Deficiency of other specified B group vitamins: Secondary | ICD-10-CM | POA: Diagnosis not present

## 2022-01-18 DIAGNOSIS — I700 Atherosclerosis of aorta: Secondary | ICD-10-CM | POA: Diagnosis not present

## 2022-01-18 DIAGNOSIS — F320 Major depressive disorder, single episode, mild: Secondary | ICD-10-CM

## 2022-01-18 DIAGNOSIS — I480 Paroxysmal atrial fibrillation: Secondary | ICD-10-CM | POA: Diagnosis not present

## 2022-01-18 MED ORDER — HYDROCHLOROTHIAZIDE 25 MG PO TABS: 25.0000 mg | ORAL_TABLET | Freq: Every day | ORAL | 3 refills | Status: DC

## 2022-01-18 NOTE — Assessment & Plan Note (Signed)
Symptoms have resolved.on current therapy. She eprefers to remain on medication

## 2022-01-18 NOTE — Assessment & Plan Note (Signed)
Not at goal on current regimen of losartan 100 mg and Toprol XL 25 mg. .  Will resume hctz

## 2022-01-18 NOTE — Assessment & Plan Note (Signed)
Stable GFR ,  Follow up with nephrology as planned, every 6 months   Lab Results  Component Value Date   CREATININE 0.97 06/08/2021

## 2022-01-18 NOTE — Assessment & Plan Note (Signed)
Occurred Spontaneously post operatively in the setting of post operative anemia,  orthostasis  and resolved spontaneously.  unclear if this was secondary to  anesthesia,  Postoperative anemia  Or other causes.  2D ECHO done in July 2023 is normal. .  She has mild CAD

## 2022-01-18 NOTE — Progress Notes (Addendum)
Subjective:  Patient ID: Jamie Anderson, female    DOB: 06/16/1935  Age: 86 y.o. MRN: [id number]  CC: The primary encounter diagnosis was Primary hypertension. Diagnoses of Hyperlipidemia LDL goal <70, B12 deficiency, Aortic atherosclerosis ([place]), Bronchiectasis without complication ([place]), Stage 3a chronic kidney disease ([place]), Major depressive disorder, single episode, mild ([place]), and Paroxysmal A-fib ([place]) were also pertinent to this visit.   HPI Jamie Anderson presents for  Chief Complaint  Patient presents with   Follow-up    6 month follow up     1) HTN:  last several readings have been elevated.  Taking losartan 100 mg daily and metoprolol xl 25 mg daily   2) h/o depression : "I'm not depressed."  Has a routine.   Sedentary   3) obesity ;  abdominal girth/  weight gain of 10 lbs since her rright knee surgery this summer. Rehabbed first at facility,  then with daughter.  Then home.   Stopped rehab because it bothered  her back not exercisig a,  and eating sweets  avoiding exercise due to recurrent episodes of positional vertigo.  Has seen ENT in the past . Had hearing tested   4) B12 deficiency: has  not been taking supplements since her knee surgery .  Had several injections followed by pills but has not had the intrinsic factor antibody   5) s/p Moh's procedure left side of nose  healing well.  Llsion on right hand not healing as well .  Not keeping it covered,  has dried out   6) "alternates between constipation and diarrhea    Outpatient Medications Prior to Visit  Medication Sig Dispense Refill   acetaminophen (TYLENOL) 325 MG tablet Take 1-2 tablets (325-650 mg total) by mouth every 6 (six) hours as needed for mild pain (pain score 1-3 or temp > 100.5). 40 tablet 1   allopurinol (ZYLOPRIM) 100 MG tablet TAKE 1 TABLET BY MOUTH  DAILY 90 tablet 3   apixaban (ELIQUIS) 5 MG TABS tablet Take 1 tablet (5 mg total) by mouth 2 (two) times daily. 60 tablet 5   calcitRIOL  (ROCALTROL) 0.25 MCG capsule Take 0.25 mcg by mouth daily.     calcium carbonate (TUMS - DOSED IN MG ELEMENTAL CALCIUM) 500 MG chewable tablet Chew 1 tablet by mouth daily as needed for indigestion or heartburn.     cetirizine (ZYRTEC) 10 MG tablet Take 10 mg by mouth daily as needed for allergies.     clobetasol cream (TEMOVATE) 0.05 % Apply to itchy rash on back 1-2 times a day until improved. Avoid face, groin, axilla. 30 g 1   conjugated estrogens (PREMARIN) vaginal cream Place 1 applicator vaginally as needed.     cyanocobalamin (,VITAMIN B-12,) 1000 MCG/ML injection Inject 1 mL (1,000 mcg total) into the muscle every 14 (fourteen) days. 1 mL 1   ezetimibe (ZETIA) 10 MG tablet TAKE 1 TABLET BY MOUTH DAILY 90 tablet 3   fluocinonide cream (LIDEX) 5.32 % Apply 1 application. topically 2 (two) times daily as needed (itching).     fluticasone (FLONASE) 50 MCG/ACT nasal spray Place 2 sprays into both nostrils daily as needed for allergies or rhinitis.     fluticasone-salmeterol (ADVAIR) 100-50 MCG/ACT AEPB Inhale 1 puff into the lungs 2 (two) times daily as needed. 180 each 1   HYDROcodone-acetaminophen (NORCO/VICODIN) 5-325 MG tablet      losartan (COZAAR) 100 MG tablet Take 1 tablet (100 mg total) by mouth daily. 90 tablet 3  Meclizine HCl 25 MG CHEW Chew 1 tablet by mouth 2 (two) times daily.     metoprolol succinate (TOPROL-XL) 25 MG 24 hr tablet Take 1 tablet (25 mg total) by mouth at bedtime. 90 tablet 3   pantoprazole (PROTONIX) 40 MG tablet TAKE 1 TABLET BY MOUTH DAILY 90 tablet 3   sertraline (ZOLOFT) 100 MG tablet TAKE 1 TABLET BY MOUTH  DAILY 90 tablet 3   simvastatin (ZOCOR) 40 MG tablet TAKE 1 TABLET BY MOUTH AT  BEDTIME 90 tablet 3   traMADol (ULTRAM) 50 MG tablet Take 50 mg by mouth every 6 (six) hours as needed.     VITAMIN D PO Take 1,000 Int'l Units/day by mouth daily.     albuterol (VENTOLIN HFA) 108 (90 Base) MCG/ACT inhaler Inhale 1 puff into the lungs every 6 (six) hours as  needed for wheezing or shortness of breath. 54 each 1   meclizine (ANTIVERT) 25 MG tablet Take 1 tablet (25 mg total) by mouth 2 (two) times daily. (Patient not taking: Reported on 01/18/2022) 180 tablet 3   No facility-administered medications prior to visit.    Review of Systems;  Patient denies headache, fevers, malaise, unintentional weight loss, skin rash, eye pain, sinus congestion and sinus pain, sore throat, dysphagia,  hemoptysis , cough, dyspnea, wheezing, chest pain, palpitations, orthopnea, edema, abdominal pain, nausea, melena, diarrhea, constipation, flank pain, dysuria, hematuria, urinary  Frequency, nocturia, numbness, tingling, seizures,  Focal weakness, Loss of consciousness,  Tremor, insomnia, depression, anxiety, and suicidal ideation.      Objective:  BP (!) 148/80 (BP Location: Left Arm, Patient Position: Sitting, Cuff Size: Large)   Pulse 72   Temp 98.1 F (36.7 C) (Oral)   Ht _0  (1.626 m)   Wt 187 lb 9.6 oz (85.1 kg)   SpO2 99%   BMI 32.20 kg/m   BP Readings from Last 3 Encounters:  01/18/22 (!) 148/80  01/07/22 136/74  12/22/21 (!) 144/60    Wt Readings from Last 3 Encounters:  01/18/22 187 lb 9.6 oz (85.1 kg)  01/07/22 188 lb 6.4 oz (85.5 kg)  12/22/21 187 lb (84.8 kg)    General appearance: alert, cooperative and appears stated age Ears: normal TM's and external ear canals both ears Throat: lips, mucosa, and tongue normal; teeth and gums normal Neck: no adenopathy, no carotid bruit, supple, symmetrical, trachea midline and thyroid not enlarged, symmetric, no tenderness/mass/nodules Back: symmetric, no curvature. ROM normal. No CVA tenderness. Lungs: clear to auscultation bilaterally Heart: regular rate and rhythm, S1, S2 normal, no murmur, click, rub or gallop Abdomen: soft, non-tender; bowel sounds normal; no masses,  no organomegaly Pulses: 2+ and symmetric Skin: Skin color, texture, turgor normal. No rashes or lesions Lymph nodes:  Cervical, supraclavicular, and axillary nodes normal. Neuro:  awake and interactive with normal mood and affect. Higher cortical functions are normal. Speech is clear without word-finding difficulty or dysarthria. Extraocular movements are intact. Visual fields of both eyes are grossly intact. Sensation to light touch is grossly intact bilaterally of upper and lower extremities. Motor examination shows 4+/5 symmetric hand grip and upper extremity and 5/5 lower extremity strength. There is no pronation or drift. Gait is non-ataxic   Lab Results  Component Value Date   HGBA1C 5.5 01/30/2018   HGBA1C 5.4 01/15/2015    Lab Results  Component Value Date   CREATININE 0.97 01/18/2022   CREATININE 0.97 06/08/2021   CREATININE 0.91 05/04/2021    Lab Results  Component Value Date  WBC 5.6 06/08/2021   HGB 9.7 (L) 06/08/2021   HCT 28.9 (L) 06/08/2021   PLT 281.0 06/08/2021   GLUCOSE 84 01/18/2022   CHOL 170 01/18/2022   TRIG 101.0 01/18/2022   HDL 80.60 01/18/2022   LDLDIRECT 67.0 01/18/2022   LDLCALC 69 01/18/2022   ALT 14 01/18/2022   AST 17 01/18/2022   NA 140 01/18/2022   K 4.2 01/18/2022   CL 104 01/18/2022   CREATININE 0.97 01/18/2022   BUN 17 01/18/2022   CO2 29 01/18/2022   TSH 0.947 05/03/2021   INR 0.9 05/29/2012   HGBA1C 5.5 01/30/2018   MICROALBUR <0.7 01/18/2022    CT CORONARY MORPH W/CTA COR W/SCORE W/CA W/CM &/OR WO/CM  Addendum Date: 07/16/2021   ADDENDUM REPORT: 07/16/2021 16:53 EXAM: OVER-READ INTERPRETATION  CT CHEST The following report is a limited chest CT over-read performed by radiologist Dr. Abigail Miyamoto of [facility], [place] on 07/16/2021. This over-read does not include interpretation of cardiac or coronary anatomy or pathology. The coronary CTA interpretation by the cardiologist is attached. COMPARISON:  05/03/2021 CTA chest FINDINGS: Vascular: Aortic atherosclerosis. No central pulmonary embolism, on this non-dedicated study. Mediastinum/Nodes: No  imaged thoracic adenopathy. No imaged thoracic adenopathy. Lungs/Pleura: No pleural fluid. 3 mm left upper lobe pulmonary nodule on 09/09 is similar to on the prior exam and present on 01/18/2017, considered benign. Upper Abdomen: Normal imaged portions of the liver, spleen, stomach Musculoskeletal: Thoracic spondylosis. IMPRESSION: No acute findings in the imaged extracardiac chest. Aortic Atherosclerosis (ICD10-I70.0). Electronically Signed   By: Abigail Miyamoto M.D.   On: 07/16/2021 16:53   Result Date: 07/16/2021 CLINICAL DATA:  Precordial pain EXAM: Cardiac/Coronary  CTA TECHNIQUE: The patient was scanned on a Siemens Somatom go.Top scanner. : A retrospective scan was triggered in the descending thoracic aorta. Axial non-contrast 3 mm slices were carried out through the heart. The data set was analyzed on a dedicated work station and scored using the [place]. Gantry rotation speed was 330 msecs and collimation was .6 mm. 158m of metoprolol and 0.8 mg of sl NTG was given. The 3D data set was reconstructed in 5% intervals of the 60-95 % of the R-R cycle. Diastolic phases were analyzed on a dedicated work station using MPR, MIP and VRT modes. The patient received 75 cc of contrast. FINDINGS: Aorta: Normal size. Ascending and descending aortic wall calcifications. No dissection. Aortic Valve:  Trileaflet.  No calcifications. Coronary Arteries:  Normal coronary origin. left dominance. RCA is a small non dominant artery.  There is no plaque. Left main is a large artery that gives rise to LAD and LCX arteries. There is calcified plaque in the ostial LM artery causing minimal stenosis (<25%). LAD has calcified plaque in the proximal segment causing mild stenosis (25-49%). LCX is a dominant artery that gives rise to two obtuse marginal OM1 branches and the PDA. There is calcified plaque in the proximal LCx causing minimal stenosis (<25%). Other findings: Normal pulmonary vein drainage into the left atrium. Normal  left atrial appendage without a thrombus. Normal size of the pulmonary artery. IMPRESSION: 1. Coronary calcium score of 390. This was 71st percentile for age and sex matched control. 2. Normal coronary origin with left dominance. 3. Calcified plaque causing mild proximal LAD stenosis (25-49%). 4. Calcified plaque causing minimal LM and LCX stenosis (<25%). 5. CAD-RADS 2. Mild non-obstructive CAD (25-49%). Consider non-atherosclerotic causes of chest pain. Consider preventive therapy and risk factor modification. Electronically Signed: By: BKate SableM.D. On:  07/16/2021 16:08    Assessment & Plan:   Problem List Items Addressed This Visit     Aortic atherosclerosis ([place])    Noted on CT angiogram done in 2018 by ED for evaluation of atypical chest pain .LDL is now < 70 on simvastatin and zetia.    Lab Results  Component Value Date   CHOL 137 07/17/2021   HDL 65.60 07/17/2021   LDLCALC 50 07/17/2021   LDLDIRECT 59.0 01/30/2018   TRIG 108.0 07/17/2021   CHOLHDL 2 07/17/2021       Relevant Medications   hydrochlorothiazide (HYDRODIURIL) 25 MG tablet   B12 deficiency    hER  Intrinisic factor antibody was negative,  RECOMMEND 4 WEEKLY INJECTIONS   FOLLOWED BY DAILY ORAL SUPPLEMENTATION      Relevant Orders   B12 and Folate Panel (Completed)   Intrinsic Factor Antibodies (Completed)   Bronchiectasis without complication (HCC)    Mild bronchiectasis and chronic bronchitis.  Patient is well controlled on current regimen.  Currently using Advair only as needed.      CKD (chronic kidney disease) stage 3, GFR 30-59 ml/min (HCC)    Stable GFR ,  Follow up with nephrology as planned, every 6 months   Lab Results  Component Value Date   CREATININE 0.97 06/08/2021        Hyperlipidemia LDL goal <70   Relevant Medications   hydrochlorothiazide (HYDRODIURIL) 25 MG tablet   Other Relevant Orders   Lipid Profile (Completed)   Direct LDL (Completed)   Major depressive disorder,  single episode, mild (HCC)    Symptoms have resolved.on current therapy. She eprefers to remain on medication       Paroxysmal A-fib ([place])    Occurred Spontaneously post operatively in the setting of post operative anemia,  orthostasis  and resolved spontaneously.  unclear if this was secondary to  anesthesia,  Postoperative anemia  Or other causes.  2D ECHO done in July 2023 is normal. .  She has mild CAD         Relevant Medications   hydrochlorothiazide (HYDRODIURIL) 25 MG tablet   Primary hypertension - Primary    Not at goal on current regimen of losartan 100 mg and Toprol XL 25 mg. .  Will resume hctz       Relevant Medications   hydrochlorothiazide (HYDRODIURIL) 25 MG tablet   Other Relevant Orders   Comp Met (CMET) (Completed)   Urine Microalbumin w/creat. ratio (Completed)    I spent a total of 40  minutes with this patient in a face to face visit on the date of this encounter reviewing the last office visit with me , ,  most recent visit with cardiologist Dr Rockey Situ ,  patient's diet and exercise habits, home blood pressure readings,  and post visit ordering of testing and therapeutics.    Follow-up: Return in about 6 months (around 07/19/2022).   Crecencio Mc, MD

## 2022-01-18 NOTE — Assessment & Plan Note (Signed)
Mild bronchiectasis and chronic bronchitis.  Patient is well controlled on current regimen.  Currently using Advair only as needed.

## 2022-01-18 NOTE — Patient Instructions (Addendum)
Your blood pressure is too high on your current medication regimen  Please Check your old bottles of blood pressure medication at home .    You need to be taking 100 mg losartan  and add  either 12.5 or  25 mg of hctz daily   Check which one you have and let me  so I can refill   Continue metoprolol XL 25 mg daily    Try using citrucel daily to keep bowels moving   Keep your wound covered to protect from bacteria and dryness

## 2022-01-18 NOTE — Assessment & Plan Note (Signed)
Noted on CT angiogram done in 2018 by ED for evaluation of atypical chest pain .LDL is now < 70 on simvastatin and zetia.    Lab Results  Component Value Date   CHOL 137 07/17/2021   HDL 65.60 07/17/2021   LDLCALC 50 07/17/2021   LDLDIRECT 59.0 01/30/2018   TRIG 108.0 07/17/2021   CHOLHDL 2 07/17/2021

## 2022-01-19 ENCOUNTER — Telehealth: Payer: Self-pay | Admitting: Internal Medicine

## 2022-01-19 ENCOUNTER — Other Ambulatory Visit (HOSPITAL_COMMUNITY): Payer: Self-pay

## 2022-01-19 ENCOUNTER — Telehealth: Payer: Self-pay | Admitting: Pharmacy Technician

## 2022-01-19 LAB — COMPREHENSIVE METABOLIC PANEL
ALT: 14 U/L (ref 0–35)
AST: 17 U/L (ref 0–37)
Albumin: 4.3 g/dL (ref 3.5–5.2)
Alkaline Phosphatase: 124 U/L — ABNORMAL HIGH (ref 39–117)
BUN: 17 mg/dL (ref 6–23)
CO2: 29 mEq/L (ref 19–32)
Calcium: 9.7 mg/dL (ref 8.4–10.5)
Chloride: 104 mEq/L (ref 96–112)
Creatinine, Ser: 0.97 mg/dL (ref 0.40–1.20)
GFR: 53.76 mL/min — ABNORMAL LOW (ref >60.00)
Glucose, Bld: 84 mg/dL (ref 70–99)
Potassium: 4.2 mEq/L (ref 3.5–5.1)
Sodium: 140 mEq/L (ref 135–145)
Total Bilirubin: 0.3 mg/dL (ref 0.2–1.2)
Total Protein: 6.5 g/dL (ref 6.0–8.3)

## 2022-01-19 LAB — LIPID PANEL
Cholesterol: 170 mg/dL (ref 0–200)
HDL: 80.6 mg/dL (ref >39.00)
LDL Cholesterol: 69 mg/dL (ref 0–99)
NonHDL: 89.28
Total CHOL/HDL Ratio: 2
Triglycerides: 101 mg/dL (ref 0.0–149.0)
VLDL: 20.2 mg/dL (ref 0.0–40.0)

## 2022-01-19 LAB — MICROALBUMIN / CREATININE URINE RATIO
Creatinine,U: 94.2 mg/dL
Microalb Creat Ratio: 0.7 mg/g (ref 0.0–30.0)
Microalb, Ur: <0.7 mg/dL (ref 0.0–1.9)

## 2022-01-19 LAB — B12 AND FOLATE PANEL
Folate: 12.5 ng/mL (ref >5.9)
Vitamin B-12: 264 pg/mL (ref 211–911)

## 2022-01-19 LAB — LDL CHOLESTEROL, DIRECT: Direct LDL: 67 mg/dL

## 2022-01-19 MED ORDER — ALBUTEROL SULFATE HFA 108 (90 BASE) MCG/ACT IN AERS: 1.0000 | INHALATION_SPRAY | Freq: Four times a day (QID) | RESPIRATORY_TRACT | 0 refills | Status: DC | PRN

## 2022-01-19 NOTE — Telephone Encounter (Signed)
Pharmacy Patient Advocate Encounter  Received fax from [facility] that Ventolin isn't covered under the pt's plan. Per fax, Generic Proair, Proair respiclick or levalbuterol HFA are preferred   Per Test Claim: generic proair hfa is $3.70    If suggested medication is appropriate, please send new rx to [facility] for a 90 day supply. Will save form in Media tab if you'd prefer to sign their form & send back.

## 2022-01-19 NOTE — Telephone Encounter (Signed)
That is fine for ProAir HFA

## 2022-01-19 NOTE — Addendum Note (Signed)
Addended by: Melvenia Needles on: 01/19/2022 05:00 PM   Modules accepted: Orders

## 2022-01-19 NOTE — Telephone Encounter (Signed)
Pt called stating the provider wanted to put her on a fluid and BP pill. Pt stated the provider would have to order it because the ones she have are expired

## 2022-01-20 NOTE — Telephone Encounter (Signed)
error 

## 2022-01-20 NOTE — Telephone Encounter (Signed)
Number busy

## 2022-01-21 LAB — INTRINSIC FACTOR ANTIBODIES: Intrinsic Factor: NEGATIVE

## 2022-01-22 NOTE — Telephone Encounter (Signed)
Home number busy. Attempted to call cell phone, no answer no voicemail.

## 2022-01-23 NOTE — Assessment & Plan Note (Signed)
hER  Intrinisic factor antibody was negative,  RECOMMEND 4 WEEKLY INJECTIONS   FOLLOWED BY DAILY ORAL SUPPLEMENTATION

## 2022-01-27 NOTE — Telephone Encounter (Signed)
Pt stated that she has received the medications already.

## 2022-02-01 DIAGNOSIS — N2581 Secondary hyperparathyroidism of renal origin: Secondary | ICD-10-CM | POA: Diagnosis not present

## 2022-02-01 DIAGNOSIS — D631 Anemia in chronic kidney disease: Secondary | ICD-10-CM | POA: Diagnosis not present

## 2022-02-01 DIAGNOSIS — N1832 Chronic kidney disease, stage 3b: Secondary | ICD-10-CM | POA: Diagnosis not present

## 2022-02-01 DIAGNOSIS — I10 Essential (primary) hypertension: Secondary | ICD-10-CM | POA: Diagnosis not present

## 2022-02-11 ENCOUNTER — Ambulatory Visit
Admission: RE | Admit: 2022-02-11 | Discharge: 2022-02-11 | Disposition: A | Discharge status: Home / Self Care | Payer: Medicare Other | Source: Ambulatory Visit | Attending: Internal Medicine | Admitting: Internal Medicine

## 2022-02-11 DIAGNOSIS — Z1231 Encounter for screening mammogram for malignant neoplasm of breast: Secondary | ICD-10-CM | POA: Insufficient documentation

## 2022-02-16 NOTE — Telephone Encounter (Signed)
Error

## 2022-04-09 ENCOUNTER — Encounter: Payer: Self-pay | Admitting: Internal Medicine

## 2022-04-09 ENCOUNTER — Ambulatory Visit (INDEPENDENT_AMBULATORY_CARE_PROVIDER_SITE_OTHER): Payer: Medicare Other | Admitting: Internal Medicine

## 2022-04-09 VITALS — BP 132/66 | HR 79 | Temp 97.9°F | Ht 64.0 in | Wt 187.4 lb

## 2022-04-09 DIAGNOSIS — J069 Acute upper respiratory infection, unspecified: Secondary | ICD-10-CM | POA: Diagnosis not present

## 2022-04-09 DIAGNOSIS — R051 Acute cough: Secondary | ICD-10-CM

## 2022-04-09 LAB — POCT INFLUENZA A/B
Influenza A, POC: NEGATIVE
Influenza B, POC: NEGATIVE

## 2022-04-09 LAB — POCT RAPID STREP A (OFFICE): Rapid Strep A Screen: NEGATIVE

## 2022-04-09 LAB — POC COVID19 BINAXNOW: SARS Coronavirus 2 Ag: NEGATIVE

## 2022-04-09 MED ORDER — PREDNISONE 10 MG PO TABS: ORAL_TABLET | ORAL | 0 refills | Status: DC

## 2022-04-09 MED ORDER — AMOXICILLIN-POT CLAVULANATE 875-125 MG PO TABS: 1.0000 | ORAL_TABLET | Freq: Two times a day (BID) | ORAL | 0 refills | Status: DC

## 2022-04-09 MED ORDER — CHERATUSSIN AC 100-10 MG/5ML PO SOLN: 5.0000 mL | Freq: Three times a day (TID) | ORAL | 0 refills | Status: DC | PRN

## 2022-04-09 NOTE — Progress Notes (Signed)
Subjective:  Patient ID: Jamie Anderson, female    DOB: 1935/09/28  Age: 87 y.o. MRN: [id number]  CC: The primary encounter diagnosis was Acute cough. A diagnosis of Viral URI with cough was also pertinent to this visit.   HPI Jamie Anderson presents for  Chief Complaint  Patient presents with   Cough    Cough, sore throat, headaches, body aches, chills, SOBr. Symptoms started yesterday. Has been taking tylenol.    87 yr old female with chronic bronchitis with  bronchiectasis ,  CKD , and PAF presents with sore throat headache,PND,  ear fullness,  diffuse body aches , chills and exertional dyspnea of 24 hours duration .  She had potential exposures to CVODI flu and RSV when spent 5 hours in th ER unmasked 3 nights ago  with a friend  .   She feels poorly due to body aches and lack of sleep. Denies nausea and diarrhea.  Coughed throughout the night  due to PND>  used her albuterol MI,  last use last evdning.    Outpatient Medications Prior to Visit  Medication Sig Dispense Refill   acetaminophen (TYLENOL) 325 MG tablet Take 1-2 tablets (325-650 mg total) by mouth every 6 (six) hours as needed for mild pain (pain score 1-3 or temp > 100.5). 40 tablet 1   albuterol (PROAIR HFA) 108 (90 Base) MCG/ACT inhaler Inhale 1-2 puffs into the lungs every 6 (six) hours as needed for wheezing or shortness of breath. 3 each 0   allopurinol (ZYLOPRIM) 100 MG tablet TAKE 1 TABLET BY MOUTH  DAILY 90 tablet 3   apixaban (ELIQUIS) 5 MG TABS tablet Take 1 tablet (5 mg total) by mouth 2 (two) times daily. 60 tablet 5   calcitRIOL (ROCALTROL) 0.25 MCG capsule Take 0.25 mcg by mouth daily.     calcium carbonate (TUMS - DOSED IN MG ELEMENTAL CALCIUM) 500 MG chewable tablet Chew 1 tablet by mouth daily as needed for indigestion or heartburn.     cetirizine (ZYRTEC) 10 MG tablet Take 10 mg by mouth daily as needed for allergies.     clobetasol cream (TEMOVATE) 0.05 % Apply to itchy rash on back 1-2 times a day  until improved. Avoid face, groin, axilla. 30 g 1   conjugated estrogens (PREMARIN) vaginal cream Place 1 applicator vaginally as needed.     cyanocobalamin (,VITAMIN B-12,) 1000 MCG/ML injection Inject 1 mL (1,000 mcg total) into the muscle every 14 (fourteen) days. 1 mL 1   ezetimibe (ZETIA) 10 MG tablet TAKE 1 TABLET BY MOUTH DAILY 90 tablet 3   fluocinonide cream (LIDEX) 8.46 % Apply 1 application. topically 2 (two) times daily as needed (itching).     fluticasone (FLONASE) 50 MCG/ACT nasal spray Place 2 sprays into both nostrils daily as needed for allergies or rhinitis.     fluticasone-salmeterol (ADVAIR) 100-50 MCG/ACT AEPB Inhale 1 puff into the lungs 2 (two) times daily as needed. 180 each 1   hydrochlorothiazide (HYDRODIURIL) 25 MG tablet Take 1 tablet (25 mg total) by mouth daily. 90 tablet 3   HYDROcodone-acetaminophen (NORCO/VICODIN) 5-325 MG tablet      losartan (COZAAR) 100 MG tablet Take 1 tablet (100 mg total) by mouth daily. 90 tablet 3   Meclizine HCl 25 MG CHEW Chew 1 tablet by mouth 2 (two) times daily.     metoprolol succinate (TOPROL-XL) 25 MG 24 hr tablet Take 1 tablet (25 mg total) by mouth at bedtime. 90 tablet 3  pantoprazole (PROTONIX) 40 MG tablet TAKE 1 TABLET BY MOUTH DAILY 90 tablet 3   sertraline (ZOLOFT) 100 MG tablet TAKE 1 TABLET BY MOUTH  DAILY 90 tablet 3   simvastatin (ZOCOR) 40 MG tablet TAKE 1 TABLET BY MOUTH AT  BEDTIME 90 tablet 3   traMADol (ULTRAM) 50 MG tablet Take 50 mg by mouth every 6 (six) hours as needed.     VITAMIN D PO Take 1,000 Int'l Units/day by mouth daily.     No facility-administered medications prior to visit.    Review of Systems;  Patient denies headache, fevers, malaise, unintentional weight loss, skin rash, eye pain, sinus congestion and sinus pain, sore throat, dysphagia,  hemoptysis , cough, dyspnea, wheezing, chest pain, palpitations, orthopnea, edema, abdominal pain, nausea, melena, diarrhea, constipation, flank pain,  dysuria, hematuria, urinary  Frequency, nocturia, numbness, tingling, seizures,  Focal weakness, Loss of consciousness,  Tremor, insomnia, depression, anxiety, and suicidal ideation.      Objective:  BP 132/66   Pulse 79   Temp 97.9 F (36.6 C) (Oral)   Ht '5\' 4"'$  (1.626 m)   Wt 187 lb 6.4 oz (85 kg)   SpO2 97%   BMI 32.17 kg/m   BP Readings from Last 3 Encounters:  04/09/22 132/66  01/18/22 (!) 148/80  01/07/22 136/74    Wt Readings from Last 3 Encounters:  04/09/22 187 lb 6.4 oz (85 kg)  01/18/22 187 lb 9.6 oz (85.1 kg)  01/07/22 188 lb 6.4 oz (85.5 kg)    Physical Exam  Lab Results  Component Value Date   HGBA1C 5.5 01/30/2018   HGBA1C 5.4 01/15/2015    Lab Results  Component Value Date   CREATININE 0.97 01/18/2022   CREATININE 0.97 06/08/2021   CREATININE 0.91 05/04/2021    Lab Results  Component Value Date   WBC 5.6 06/08/2021   HGB 9.7 (L) 06/08/2021   HCT 28.9 (L) 06/08/2021   PLT 281.0 06/08/2021   GLUCOSE 84 01/18/2022   CHOL 170 01/18/2022   TRIG 101.0 01/18/2022   HDL 80.60 01/18/2022   LDLDIRECT 67.0 01/18/2022   LDLCALC 69 01/18/2022   ALT 14 01/18/2022   AST 17 01/18/2022   NA 140 01/18/2022   K 4.2 01/18/2022   CL 104 01/18/2022   CREATININE 0.97 01/18/2022   BUN 17 01/18/2022   CO2 29 01/18/2022   TSH 0.947 05/03/2021   INR 0.9 05/29/2012   HGBA1C 5.5 01/30/2018   MICROALBUR <0.7 01/18/2022    MM 3D SCREEN BREAST BILATERAL  Result Date: 02/12/2022 CLINICAL DATA:  Screening. EXAM: DIGITAL SCREENING BILATERAL MAMMOGRAM WITH TOMOSYNTHESIS AND CAD TECHNIQUE: Bilateral screening digital craniocaudal and mediolateral oblique mammograms were obtained. Bilateral screening digital breast tomosynthesis was performed. The images were evaluated with computer-aided detection. COMPARISON:  Previous exam(s). ACR Breast Density Category b: There are scattered areas of fibroglandular density. FINDINGS: There are no findings suspicious for malignancy.  IMPRESSION: No mammographic evidence of malignancy. A result letter of this screening mammogram will be mailed directly to the patient. RECOMMENDATION: Screening mammogram in one year. (Code:SM-B-01Y) BI-RADS CATEGORY  1: Negative. Electronically Signed   By: Ileana Roup M.D.   On: 02/12/2022 13:14    Assessment & Plan:  .Acute cough -     POCT Influenza A/B -     POCT rapid strep A -     POC COVID-19 BinaxNow  Viral URI with cough Assessment & Plan: 24 hours duration.  Rapid flu is negative.  COVID test is  negative.  Given her h/o bronchiectasis,  will cover empirically for CAP with augmentin ,  prednisone and cheratussin    Other orders -     predniSONE; 6 tablets on Day 1 , then reduce by 1 tablet daily until gone  Dispense: 21 tablet; Refill: 0 -     Amoxicillin-Pot Clavulanate; Take 1 tablet by mouth 2 (two) times daily.  Dispense: 14 tablet; Refill: 0 -     Cheratussin AC; Take 5 mLs by mouth 3 (three) times daily as needed for cough.  Dispense: 120 mL; Refill: 0      Follow-up: No follow-ups on file.   Crecencio Mc, MD

## 2022-04-09 NOTE — Patient Instructions (Addendum)
I am treating you for sinusitis/bronchitis     I am prescribing an antibiotic (augmentin: take twice daily with food ) and a prednisone taper  To manage the infection and the inflammation in your sinuses.   The prednisone should be taken as follows:   6 tablets all at once on Day 1 5 tablets all at once on Day 2 4 tablets all at once on Day 3 3 tablets all at once on Day 4  2 tablets all at once on Day 5 1 on Day 6   Use the cheratussin  For nighttime COUGH.  THIS MEDICATION HAS codeine  IN IT,  SO DO NOT COMBINE WITH ALCOHOL OR OTHER SEDATING MEDICATIONS ( 2 HOUR WINDOW) AND DO NOT SHARE!  Ok to use colace,  and either  citrucel benefiber or metamucil daily for constipation   Gargle with salt water as needed for sore throat.   Please take a probiotic ( Align, Floraque or Culturelle), the generic version of one of these over the counter medications, or an alternative form (kombucha,  Yogurt, or another dietary source) for a minimum of 3 weeks to prevent a serious antibiotic associated diarrhea  Called clostridium dificile colitis.    Avoid any cough medicine or cold product that has phenylephrine or pseudoephedrine  Ok to use Delsym or plain robitussin   Tylenol 500 mg every 6 hours  for the body aches   Try to sleep with torso elevated (wedge)

## 2022-04-09 NOTE — Assessment & Plan Note (Addendum)
24 hours duration.  Rapid flu is negative.  COVID test is  negative.  Given her h/o bronchiectasis,  will cover empirically for CAP with augmentin ,  prednisone and cheratussin

## 2022-04-12 ENCOUNTER — Telehealth: Payer: Self-pay | Admitting: Cardiovascular Disease

## 2022-04-12 ENCOUNTER — Other Ambulatory Visit: Payer: Self-pay | Admitting: Internal Medicine

## 2022-04-12 ENCOUNTER — Telehealth: Payer: Self-pay | Admitting: Internal Medicine

## 2022-04-12 DIAGNOSIS — I480 Paroxysmal atrial fibrillation: Secondary | ICD-10-CM

## 2022-04-12 MED ORDER — LEVOFLOXACIN 500 MG PO TABS: 500.0000 mg | ORAL_TABLET | Freq: Every day | ORAL | 0 refills | Status: AC

## 2022-04-12 MED ORDER — APIXABAN 5 MG PO TABS: 5.0000 mg | ORAL_TABLET | Freq: Two times a day (BID) | ORAL | 2 refills | Status: DC

## 2022-04-12 NOTE — Telephone Encounter (Signed)
Pt called in staying that over the weekend they prescribed her amoxicillin-clavulanate (AUGMENTIN) 875-125 MG tablet, and she had a reaction from it hives, nauseas, etc. She was wants to know if Dr. Derrel Nip wants her to change it? She still have the cough.

## 2022-04-12 NOTE — Progress Notes (Signed)
Levaquin 500 mg one tablet daily x 7 days  sent to [facility]

## 2022-04-12 NOTE — Telephone Encounter (Signed)
*  STAT* If patient is at the pharmacy, call can be transferred to refill team.   1. Which medications need to be refilled? (please list name of each medication and dose if known)   apixaban (ELIQUIS) 5 MG TABS tablet    2. Which pharmacy/location (including street and city if local pharmacy) is medication to be sent to? [facility] ([place], [place]   3. Do they need a 30 day or 90 day supply? 90 day supply

## 2022-04-12 NOTE — Telephone Encounter (Signed)
Spoke with pt and she stated that she had a reaction to the amoxcillin she was prescribed. Pt stated that after starting the medication she developed hives and nausea. Pt stated that she stopped the medication but is still having symptoms of cough and wheezing. She would like to see a different antibiotic can be called in.

## 2022-04-12 NOTE — Telephone Encounter (Signed)
Pt is aware and gave a verbal understanding.  

## 2022-04-27 ENCOUNTER — Other Ambulatory Visit: Payer: Self-pay | Admitting: Cardiovascular Disease

## 2022-04-27 DIAGNOSIS — I480 Paroxysmal atrial fibrillation: Secondary | ICD-10-CM

## 2022-04-27 NOTE — Telephone Encounter (Signed)
Please review

## 2022-04-27 NOTE — Telephone Encounter (Signed)
Eliquis 64m refill request received. Patient is 87years old, weight-85kg, Crea-0.97 on 01/18/22, Diagnosis-Afib, and last seen by Dr. GRockey Situon 12/22/21. Dose is appropriate based on dosing criteria. Will send in refill to requested pharmacy.

## 2022-04-29 ENCOUNTER — Other Ambulatory Visit: Payer: Self-pay | Admitting: Internal Medicine

## 2022-05-12 ENCOUNTER — Other Ambulatory Visit: Payer: Self-pay | Admitting: Internal Medicine

## 2022-05-12 DIAGNOSIS — M25561 Pain in right knee: Secondary | ICD-10-CM

## 2022-05-17 ENCOUNTER — Telehealth: Payer: Self-pay | Admitting: Internal Medicine

## 2022-05-17 NOTE — Telephone Encounter (Signed)
pt called stating she thinks she has bronchitis like she had in january and would like medication called in

## 2022-05-17 NOTE — Telephone Encounter (Signed)
Spoke with pt and scheduled her for an appt tomorrow. Pt is aware of appt date and time.  ?

## 2022-05-18 ENCOUNTER — Encounter: Payer: Self-pay | Admitting: Family

## 2022-05-18 ENCOUNTER — Ambulatory Visit (INDEPENDENT_AMBULATORY_CARE_PROVIDER_SITE_OTHER): Payer: Medicare Other

## 2022-05-18 ENCOUNTER — Ambulatory Visit (INDEPENDENT_AMBULATORY_CARE_PROVIDER_SITE_OTHER): Payer: Medicare Other | Admitting: Family

## 2022-05-18 VITALS — BP 132/82 | HR 67 | Temp 97.9°F | Ht 64.0 in | Wt 188.8 lb

## 2022-05-18 DIAGNOSIS — J029 Acute pharyngitis, unspecified: Secondary | ICD-10-CM

## 2022-05-18 DIAGNOSIS — J42 Unspecified chronic bronchitis: Secondary | ICD-10-CM | POA: Diagnosis not present

## 2022-05-18 DIAGNOSIS — R059 Cough, unspecified: Secondary | ICD-10-CM | POA: Diagnosis not present

## 2022-05-18 LAB — POC COVID19 BINAXNOW: SARS Coronavirus 2 Ag: NEGATIVE

## 2022-05-18 LAB — POCT RAPID STREP A (OFFICE): Rapid Strep A Screen: NEGATIVE

## 2022-05-18 NOTE — Assessment & Plan Note (Signed)
Reassuring HEENT exam today.  Patient recently completed prednisone and Levaquin with resolution of symptoms.  We discussed longstanding history of seasonal allergies.  I suspect symptoms are more viral or allergic in nature.  Negative for COVID and strep today.  She has not been taking Advair as prescribed by pulmonology on a regular basis.  She has been using albuterol twice daily.  Counseled her on the difference between preventative and rescue inhalers.  Advised her to start Advair daily and be consistent with medication as well as rinse out mouth afterwards. She will continue Flonase, Zyrtec.  Consider allergy referral if she as she has seen allergy in the past.  Pending chest x-ray to ensure no pneumonia.

## 2022-05-18 NOTE — Progress Notes (Signed)
Assessment & Plan:  Chronic bronchitis, unspecified chronic bronchitis type ([place]) Assessment & Plan: Reassuring HEENT exam today.  Patient recently completed prednisone and Levaquin with resolution of symptoms.  We discussed longstanding history of seasonal allergies.  I suspect symptoms are more viral or allergic in nature.  Negative for COVID and strep today.  She has not been taking Advair as prescribed by pulmonology on a regular basis.  She has been using albuterol twice daily.  Counseled her on the difference between preventative and rescue inhalers.  Advised her to start Advair daily and be consistent with medication as well as rinse out mouth afterwards. She will continue Flonase, Zyrtec.  Consider allergy referral if she as she has seen allergy in the past.  Pending chest x-ray to ensure no pneumonia.  Orders: -     DG Chest 2 View  Sore throat -     POCT rapid strep A -     POC COVID-19 BinaxNow     Return precautions given.   Risks, benefits, and alternatives of the medications and treatment plan prescribed today were discussed, and patient expressed understanding.   Education regarding symptom management and diagnosis given to patient on AVS either electronically or printed.  No follow-ups on file.  Jamie Paris, FNP  Subjective:    Patient ID: Jamie Anderson, female    DOB: Sep 27, 1935, 87 y.o.   MRN: [id number]  CC: Jamie Anderson is a 87 y.o. female who presents today for an acute visit.    HPI: Complains of left sided sore throat x  4 days Dry cough started 2 days later. Cough is worse at bedtime  Endorses clear nasal congestion, occassional wheezing  No fever, ear pain, sinus pain    She is using tylenol,albuterol BID . She is not using Advair.    Cough 04/09/22. Started prednisone and augmentin. Developed hives with augmentin, which she then started levaquin with symptom resolution.   She is compliant with flonase, zyrtec History of atrial  fibrillation, hypertension, chronic bronchitis, chronic kidney disease, asthma, gerd  Strong h/o seasonal allergies.  She has been on allergy shots.   Egfr 59   Allergies: Almond meal, Amoxicillin-pot clavulanate, Calcitriol, Clarithromycin, Colloidal oatmeal, Dust mite extract, Gluten meal, Molds & smuts, Oat, Other, and Shrimp [shellfish allergy] Current Outpatient Medications on File Prior to Visit  Medication Sig Dispense Refill   acetaminophen (TYLENOL) 325 MG tablet Take 1-2 tablets (325-650 mg total) by mouth every 6 (six) hours as needed for mild pain (pain score 1-3 or temp > 100.5). 40 tablet 1   albuterol (PROAIR HFA) 108 (90 Base) MCG/ACT inhaler Inhale 1-2 puffs into the lungs every 6 (six) hours as needed for wheezing or shortness of breath. 3 each 0   allopurinol (ZYLOPRIM) 100 MG tablet TAKE 1 TABLET BY MOUTH  DAILY 90 tablet 3   apixaban (ELIQUIS) 5 MG TABS tablet Take 1 tablet (5 mg total) by mouth 2 (two) times daily. 60 tablet 5   calcitRIOL (ROCALTROL) 0.25 MCG capsule Take 0.25 mcg by mouth daily.     calcium carbonate (TUMS - DOSED IN MG ELEMENTAL CALCIUM) 500 MG chewable tablet Chew 1 tablet by mouth daily as needed for indigestion or heartburn.     cetirizine (ZYRTEC) 10 MG tablet Take 10 mg by mouth daily as needed for allergies.     clobetasol cream (TEMOVATE) 0.05 % Apply to itchy rash on back 1-2 times a day until improved. Avoid face, groin, axilla. [place]  g 1   conjugated estrogens (PREMARIN) vaginal cream Place 1 applicator vaginally as needed.     cyanocobalamin (,VITAMIN B-12,) 1000 MCG/ML injection Inject 1 mL (1,000 mcg total) into the muscle every 14 (fourteen) days. 1 mL 1   ezetimibe (ZETIA) 10 MG tablet TAKE 1 TABLET BY MOUTH DAILY 90 tablet 3   fluocinonide cream (LIDEX) [id number] % Apply 1 application. topically 2 (two) times daily as needed (itching).     fluticasone (FLONASE) 50 MCG/ACT nasal spray Place 2 sprays into both nostrils daily as needed for  allergies or rhinitis.     fluticasone-salmeterol (ADVAIR) 100-50 MCG/ACT AEPB Inhale 1 puff into the lungs 2 (two) times daily as needed. 180 each 1   guaiFENesin-codeine (CHERATUSSIN AC) 100-10 MG/5ML syrup Take 5 mLs by mouth 3 (three) times daily as needed for cough. 120 mL 0   hydrochlorothiazide (HYDRODIURIL) 25 MG tablet Take 1 tablet (25 mg total) by mouth daily. 90 tablet 3   losartan (COZAAR) 100 MG tablet Take 1 tablet (100 mg total) by mouth daily. 90 tablet 3   Meclizine HCl 25 MG CHEW Chew 1 tablet by mouth 2 (two) times daily.     metoprolol succinate (TOPROL-XL) 25 MG 24 hr tablet TAKE 1 TABLET BY MOUTH AT  BEDTIME 90 tablet 3   pantoprazole (PROTONIX) 40 MG tablet TAKE 1 TABLET BY MOUTH DAILY 90 tablet 3   sertraline (ZOLOFT) 100 MG tablet TAKE 1 TABLET BY MOUTH DAILY 90 tablet 3   simvastatin (ZOCOR) 40 MG tablet TAKE 1 TABLET BY MOUTH AT  BEDTIME 90 tablet 3   traMADol (ULTRAM) 50 MG tablet Take 50 mg by mouth every 6 (six) hours as needed.     VITAMIN D PO Take 1,000 Int'l Units/day by mouth daily.     No current facility-administered medications on file prior to visit.    Review of Systems  Constitutional:  Negative for chills and fever.  HENT:  Positive for congestion and sore throat. Negative for ear pain and sinus pain.   Respiratory:  Positive for choking and wheezing. Negative for cough.   Cardiovascular:  Negative for chest pain and palpitations.  Gastrointestinal:  Negative for nausea and vomiting.      Objective:    BP 132/82   Pulse 67   Temp 97.9 F (36.6 C) (Oral)   Ht '5\' 4"'$  (1.626 m)   Wt 188 lb 12.8 oz (85.6 kg)   SpO2 97%   BMI 32.41 kg/m   BP Readings from Last 3 Encounters:  05/18/22 132/82  04/09/22 132/66  01/18/22 (!) 148/80   Wt Readings from Last 3 Encounters:  05/18/22 188 lb 12.8 oz (85.6 kg)  04/09/22 187 lb 6.4 oz (85 kg)  01/18/22 187 lb 9.6 oz (85.1 kg)    Physical Exam Vitals reviewed.  Constitutional:      Appearance:  She is well-developed.  HENT:     Head: Normocephalic and atraumatic.     Right Ear: Hearing, tympanic membrane, ear canal and external ear normal. No decreased hearing noted. No drainage, swelling or tenderness. No middle ear effusion. No foreign body. Tympanic membrane is not erythematous or bulging.     Left Ear: Hearing, tympanic membrane, ear canal and external ear normal. No decreased hearing noted. No drainage, swelling or tenderness.  No middle ear effusion. No foreign body. Tympanic membrane is not erythematous or bulging.     Nose: Nose normal. No rhinorrhea.     Right Sinus: No maxillary  sinus tenderness or frontal sinus tenderness.     Left Sinus: No maxillary sinus tenderness or frontal sinus tenderness.     Mouth/Throat:     Pharynx: Uvula midline. No oropharyngeal exudate or posterior oropharyngeal erythema.     Tonsils: No tonsillar abscesses.  Eyes:     Conjunctiva/sclera: Conjunctivae normal.  Cardiovascular:     Rate and Rhythm: Regular rhythm.     Pulses: Normal pulses.     Heart sounds: Normal heart sounds.  Pulmonary:     Effort: Pulmonary effort is normal.     Breath sounds: Normal breath sounds. No wheezing, rhonchi or rales.  Lymphadenopathy:     Head:     Right side of head: No submental, submandibular, tonsillar, preauricular, posterior auricular or occipital adenopathy.     Left side of head: No submental, submandibular, tonsillar, preauricular, posterior auricular or occipital adenopathy.     Cervical: No cervical adenopathy.  Skin:    General: Skin is warm and dry.  Neurological:     Mental Status: She is alert.  Psychiatric:        Speech: Speech normal.        Behavior: Behavior normal.        Thought Content: Thought content normal.

## 2022-05-18 NOTE — Patient Instructions (Addendum)
Please start using Advair daily as 1 puff two times per day. Rinse your mouth out afterwards  You can use albuterol which is a rescue inhaler.  Use albuterol every 6 hours for first 24 hours to get good medication into the lungs and loosen congestion; after, you may use as needed and eventually stop all together when cough resolves.  Consider allergy referral or follow up with pulmonology if symptom persists.   Please let me know how you are doing.

## 2022-05-20 ENCOUNTER — Telehealth: Payer: Self-pay

## 2022-05-20 MED ORDER — FLUTICASONE-SALMETEROL 100-50 MCG/ACT IN AEPB: 1.0000 | INHALATION_SPRAY | Freq: Two times a day (BID) | RESPIRATORY_TRACT | 1 refills | Status: DC | PRN

## 2022-05-20 NOTE — Telephone Encounter (Signed)
Medication has been refilled.

## 2022-05-20 NOTE — Telephone Encounter (Signed)
Prescription Request  05/20/2022  LOV: Visit date not found  What is the name of the medication or equipment? fluticasone-salmeterol (ADVAIR) 100-50 MCG/ACT AEPB  Have you contacted your pharmacy to request a refill? No   Which pharmacy would you like this sent to?  [place], [place] - [address] [place] [address] Phone: [phone] Fax: [phone]   Patient notified that their request is being sent to the clinical staff for review and that they should receive a response within 2 business days.   Please advise at [place] [phone] [facility])  Patient states she saw Mable Paris, FNP, on 05/18/2022 and Mable Paris, FNP, asked her to use her inhalers.  Patient states her fluticasone-salmeterol (ADVAIR) 100-50 MCG/ACT AEPB inhaler is out of date, so she needs to have it refilled as soon as possible.  Patient states she is still coughing and sleeps with a cough drop in her mouth.

## 2022-05-27 DIAGNOSIS — I10 Essential (primary) hypertension: Secondary | ICD-10-CM | POA: Diagnosis not present

## 2022-05-27 DIAGNOSIS — N1831 Chronic kidney disease, stage 3a: Secondary | ICD-10-CM | POA: Diagnosis not present

## 2022-05-27 DIAGNOSIS — N2581 Secondary hyperparathyroidism of renal origin: Secondary | ICD-10-CM | POA: Diagnosis not present

## 2022-05-27 DIAGNOSIS — D631 Anemia in chronic kidney disease: Secondary | ICD-10-CM | POA: Diagnosis not present

## 2022-05-27 DIAGNOSIS — N1832 Chronic kidney disease, stage 3b: Secondary | ICD-10-CM | POA: Diagnosis not present

## 2022-06-04 DIAGNOSIS — M1712 Unilateral primary osteoarthritis, left knee: Secondary | ICD-10-CM | POA: Diagnosis not present

## 2022-06-13 ENCOUNTER — Other Ambulatory Visit: Payer: Self-pay | Admitting: Cardiovascular Disease

## 2022-06-22 ENCOUNTER — Ambulatory Visit: Payer: Medicare Other | Admitting: Dermatology

## 2022-06-22 VITALS — BP 136/67 | HR 71

## 2022-06-22 DIAGNOSIS — Z85828 Personal history of other malignant neoplasm of skin: Secondary | ICD-10-CM | POA: Diagnosis not present

## 2022-06-22 DIAGNOSIS — Z79899 Other long term (current) drug therapy: Secondary | ICD-10-CM | POA: Diagnosis not present

## 2022-06-22 DIAGNOSIS — L7211 Pilar cyst: Secondary | ICD-10-CM

## 2022-06-22 DIAGNOSIS — L281 Prurigo nodularis: Secondary | ICD-10-CM | POA: Diagnosis not present

## 2022-06-22 DIAGNOSIS — L578 Other skin changes due to chronic exposure to nonionizing radiation: Secondary | ICD-10-CM

## 2022-06-22 DIAGNOSIS — Z7189 Other specified counseling: Secondary | ICD-10-CM

## 2022-06-22 DIAGNOSIS — L729 Follicular cyst of the skin and subcutaneous tissue, unspecified: Secondary | ICD-10-CM

## 2022-06-22 MED ORDER — DUPILUMAB 300 MG/2ML ~~LOC~~ SOSY
600.0000 mg | PREFILLED_SYRINGE | Freq: Once | SUBCUTANEOUS | Status: AC
  Administered 2022-06-22: 600 mg via SUBCUTANEOUS

## 2022-06-22 MED ORDER — TACROLIMUS 0.1 % EX OINT: TOPICAL_OINTMENT | CUTANEOUS | 1 refills | Status: DC

## 2022-06-22 MED ORDER — DUPIXENT 300 MG/2ML ~~LOC~~ SOAJ: 300.0000 mg | SUBCUTANEOUS | 5 refills | Status: DC

## 2022-06-22 NOTE — Progress Notes (Signed)
Follow-Up Visit   Subjective  Jamie Anderson is a 87 y.o. female who presents for the following: 6 month follow-up. She has a history of SCCs of the R thenar hand (EDC), R upper pretibia (EDC), and L nasal dorsum (Mohs). She has lots of itching and nodules on her arms and legs. She picks at.  She has a history of dermatitis and has tried clobetasol cream, Eucrisa ointment, and fluocinonide cream. She also has h/o GA.   The following portions of the chart were reviewed this encounter and updated as appropriate: medications, allergies, medical history  Review of Systems:  No other skin or systemic complaints except as noted in HPI or Assessment and Plan.  Objective  Well appearing patient in no apparent distress; mood and affect are within normal limits.  A focused examination was performed of the following areas: Face, arms, legs, hands  Relevant exam findings are noted in the Assessment and Plan.         Assessment & Plan  HISTORY OF SQUAMOUS CELL CARCINOMA OF THE SKIN - No evidence of recurrence today of the right thenar hand dorsum, right upper pretibial, other areas (see History) - Recommend regular full body skin exams - Recommend daily broad spectrum sunscreen SPF 30+ to sun-exposed areas, reapply every 2 hours as needed.  - Call if any new or changing lesions are noted between office visits  HISTORY OF BASAL CELL CARCINOMA OF THE SKIN - No evidence of recurrence today of the left nasal dorsum - Recommend regular full body skin exams - Recommend daily broad spectrum sunscreen SPF 30+ to sun-exposed areas, reapply every 2 hours as needed.  - Call if any new or changing lesions are noted between office visits  ACTINIC DAMAGE - chronic, secondary to cumulative UV radiation exposure/sun exposure over time - diffuse scaly erythematous macules with underlying dyspigmentation - Recommend daily broad spectrum sunscreen SPF 30+ to sun-exposed areas, reapply every 2 hours as  needed.  - Recommend staying in the shade or wearing long sleeves, sun glasses (UVA+UVB protection) and wide brim hats (4-inch brim around the entire circumference of the hat). - Call for new or changing lesions.   Pilar Cyst Exam: Subcutaneous nodule of the left posterior scalp.  Benign-appearing. Exam most consistent with a pilar cyst. Discussed that a cyst is a benign growth that can grow over time and sometimes get irritated or inflamed. Recommend observation if it is not bothersome. Discussed option of surgical excision to remove it if it is growing, symptomatic, or other changes noted. Please call for new or changing lesions so they can be evaluated.  PRURIGO NODULES Exam: Excoriated pink papules and nodules at lower legs, arms, >20 lesions, BSA 10%  Chronic and persistent condition with duration or expected duration over one year. Condition is bothersome/symptomatic for patient. Currently flared.   Treatment Plan: Avoid picking.  Patient has tried and failed [place] Ointment, fluocinonide cream, and clobetasol cream.   Start tacrolimus 0.1% ointment Apply to legs, arms QHS dsp 100g 1Rf.  Discussed Dupixent injections, patient would like to start.   Dupixent 300 mg/40mL x 2 samples injected to the left and right upper arm today. Patient tolerated well. Lot No. [id number] Exp 08/2024. NDC [id number].  Dupixent 300 mg/14mL inject subcutaneously q 14 days for maintenance 5Rf. Rx sent to [facility].   Dupilumab (Dupixent) is a treatment given by injection for adults and children with moderate-to-severe atopic dermatitis. Goal is control of skin condition, not cure. It is given  as 2 injections at the first dose followed by 1 injection ever 2 weeks thereafter.  Young children are dosed monthly.  Potential side effects include allergic reaction, herpes infections, injection site reactions and conjunctivitis (inflammation of the eyes).  The use of Dupixent requires long term medication  management, including periodic office visits.  Prurigo nodularis  Related Medications dupilumab (DUPIXENT) prefilled syringe 600 mg     Return in about 1 month (around 07/22/2022) for PN w/ Dr Kathie Rhodes. Also 2 week nurse f/u for Dupixent injection (sample).  ICherlyn Labella, CMA, am acting as scribe for Willeen Niece, MD .   Documentation: I have reviewed the above documentation for accuracy and completeness, and I agree with the above.  Willeen Niece, MD

## 2022-06-22 NOTE — Patient Instructions (Signed)
Due to recent changes in healthcare laws, you may see results of your pathology and/or laboratory studies on MyChart before the doctors have had a chance to review them. We understand that in some cases there may be results that are confusing or concerning to you. Please understand that not all results are received at the same time and often the doctors may need to interpret multiple results in order to provide you with the best plan of care or course of treatment. Therefore, we ask that you please give us 2 business days to thoroughly review all your results before contacting the office for clarification. Should we see a critical lab result, you will be contacted sooner.   If You Need Anything After Your Visit  If you have any questions or concerns for your doctor, please call our main line at [phone] and press option 4 to reach your doctor's medical assistant. If no one answers, please leave a voicemail as directed and we will return your call as soon as possible. Messages left after 4 pm will be answered the following business day.   You may also send us a message via MyChart. We typically respond to MyChart messages within 1-2 business days.  For prescription refills, please ask your pharmacy to contact our office. Our fax number is [phone].  If you have an urgent issue when the clinic is closed that cannot wait until the next business day, you can page your doctor at the number below.    Please note that while we do our best to be available for urgent issues outside of office hours, we are not available 24/7.   If you have an urgent issue and are unable to reach us, you may choose to seek medical care at your doctor's office, retail clinic, urgent care center, or emergency room.  If you have a medical emergency, please immediately call 911 or go to the emergency department.  Pager Numbers  - Dr. Kowalski: [phone]  - Dr. Moye: [phone]  - Dr. Stewart:  [phone]  In the event of inclement weather, please call our main line at [phone] for an update on the status of any delays or closures.  Dermatology Medication Tips: Please keep the boxes that topical medications come in in order to help keep track of the instructions about where and how to use these. Pharmacies typically print the medication instructions only on the boxes and not directly on the medication tubes.   If your medication is too expensive, please contact our office at [phone] option 4 or send us a message through MyChart.   We are unable to tell what your co-pay for medications will be in advance as this is different depending on your insurance coverage. However, we may be able to find a substitute medication at lower cost or fill out paperwork to get insurance to cover a needed medication.   If a prior authorization is required to get your medication covered by your insurance company, please allow us 1-2 business days to complete this process.  Drug prices often vary depending on where the prescription is filled and some pharmacies may offer cheaper prices.  The website www.goodrx.com contains coupons for medications through different pharmacies. The prices here do not account for what the cost may be with help from insurance (it may be cheaper with your insurance), but the website can give you the price if you did not use any insurance.  - You can print the associated coupon and take it with   your prescription to the pharmacy.  - You may also stop by our office during regular business hours and pick up a GoodRx coupon card.  - If you need your prescription sent electronically to a different pharmacy, notify our office through [facility] MyChart or by phone at [phone] option 4.     Si Usted Necesita Algo Despus de Su Visita  Tambin puede enviarnos un mensaje a travs de MyChart. Por lo general respondemos a los mensajes de MyChart en el transcurso de 1 a 2  das hbiles.  Para renovar recetas, por favor pida a su farmacia que se ponga en contacto con nuestra oficina. Nuestro nmero de fax es el [phone].  Si tiene un asunto urgente cuando la clnica est cerrada y que no puede esperar hasta el siguiente da hbil, puede llamar/localizar a su doctor(a) al nmero que aparece a continuacin.   Por favor, tenga en cuenta que aunque hacemos todo lo posible para estar disponibles para asuntos urgentes fuera del horario de oficina, no estamos disponibles las 24 horas del da, los 7 das de la semana.   Si tiene un problema urgente y no puede comunicarse con nosotros, puede optar por buscar atencin mdica  en el consultorio de su doctor(a), en una clnica privada, en un centro de atencin urgente o en una sala de emergencias.  Si tiene una emergencia mdica, por favor llame inmediatamente al 911 o vaya a la sala de emergencias.  Nmeros de bper  - Dr. Kowalski: [phone]  - Dra. Moye: [phone]  - Dra. Stewart: [phone]  En caso de inclemencias del tiempo, por favor llame a nuestra lnea principal al [phone] para una actualizacin sobre el estado de cualquier retraso o cierre.  Consejos para la medicacin en dermatologa: Por favor, guarde las cajas en las que vienen los medicamentos de uso tpico para ayudarle a seguir las instrucciones sobre dnde y cmo usarlos. Las farmacias generalmente imprimen las instrucciones del medicamento slo en las cajas y no directamente en los tubos del medicamento.   Si su medicamento es muy caro, por favor, pngase en contacto con nuestra oficina llamando al [phone] y presione la opcin 4 o envenos un mensaje a travs de MyChart.   No podemos decirle cul ser su copago por los medicamentos por adelantado ya que esto es diferente dependiendo de la cobertura de su seguro. Sin embargo, es posible que podamos encontrar un medicamento sustituto a menor costo o llenar un formulario para que el  seguro cubra el medicamento que se considera necesario.   Si se requiere una autorizacin previa para que su compaa de seguros cubra su medicamento, por favor permtanos de 1 a 2 das hbiles para completar este proceso.  Los precios de los medicamentos varan con frecuencia dependiendo del lugar de dnde se surte la receta y alguna farmacias pueden ofrecer precios ms baratos.  El sitio web www.goodrx.com tiene cupones para medicamentos de diferentes farmacias. Los precios aqu no tienen en cuenta lo que podra costar con la ayuda del seguro (puede ser ms barato con su seguro), pero el sitio web puede darle el precio si no utiliz ningn seguro.  - Puede imprimir el cupn correspondiente y llevarlo con su receta a la farmacia.  - Tambin puede pasar por nuestra oficina durante el horario de atencin regular y recoger una tarjeta de cupones de GoodRx.  - Si necesita que su receta se enve electrnicamente a una farmacia diferente, informe a nuestra oficina a travs de MyChart de [facility]   o por telfono llamando al [phone] y presione la opcin 4.  

## 2022-06-24 ENCOUNTER — Ambulatory Visit: Payer: Medicare Other | Admitting: Dermatology

## 2022-06-28 ENCOUNTER — Other Ambulatory Visit: Payer: Self-pay

## 2022-06-28 MED ORDER — DUPIXENT 300 MG/2ML ~~LOC~~ SOAJ: 300.0000 mg | SUBCUTANEOUS | 5 refills | Status: DC

## 2022-06-28 NOTE — Progress Notes (Signed)
Fax from [facility] stating they need a new Rx sent in for maintenance dose of Dupixent. Escripted

## 2022-07-06 ENCOUNTER — Ambulatory Visit (INDEPENDENT_AMBULATORY_CARE_PROVIDER_SITE_OTHER): Payer: Medicare Other

## 2022-07-06 DIAGNOSIS — L281 Prurigo nodularis: Secondary | ICD-10-CM | POA: Diagnosis not present

## 2022-07-06 MED ORDER — DUPILUMAB 300 MG/2ML ~~LOC~~ SOAJ
300.0000 mg | Freq: Once | SUBCUTANEOUS | Status: AC
  Administered 2022-07-06: 300 mg via SUBCUTANEOUS

## 2022-07-06 NOTE — Progress Notes (Signed)
Patient here today for Dupixent injection for chronic and persistent prurigo nodules.   Dupixent 300mg /61mL injected into left upper thigh. Patient tolerated well. LOT: [id number] EXP: 04/07/2024  Patient also practiced with training injection pen since she would like to self inject once approved and has her own medication.  Dorathy Daft, RMA

## 2022-07-19 ENCOUNTER — Encounter: Payer: Self-pay | Admitting: Internal Medicine

## 2022-07-19 ENCOUNTER — Ambulatory Visit (INDEPENDENT_AMBULATORY_CARE_PROVIDER_SITE_OTHER): Payer: Medicare Other | Admitting: Internal Medicine

## 2022-07-19 VITALS — BP 158/60 | HR 77 | Temp 98.3°F | Ht 64.0 in | Wt 189.8 lb

## 2022-07-19 DIAGNOSIS — I700 Atherosclerosis of aorta: Secondary | ICD-10-CM | POA: Diagnosis not present

## 2022-07-19 DIAGNOSIS — R944 Abnormal results of kidney function studies: Secondary | ICD-10-CM | POA: Diagnosis not present

## 2022-07-19 DIAGNOSIS — D509 Iron deficiency anemia, unspecified: Secondary | ICD-10-CM | POA: Diagnosis not present

## 2022-07-19 DIAGNOSIS — R5383 Other fatigue: Secondary | ICD-10-CM | POA: Diagnosis not present

## 2022-07-19 DIAGNOSIS — F422 Mixed obsessional thoughts and acts: Secondary | ICD-10-CM

## 2022-07-19 DIAGNOSIS — E785 Hyperlipidemia, unspecified: Secondary | ICD-10-CM | POA: Diagnosis not present

## 2022-07-19 DIAGNOSIS — I480 Paroxysmal atrial fibrillation: Secondary | ICD-10-CM

## 2022-07-19 DIAGNOSIS — I10 Essential (primary) hypertension: Secondary | ICD-10-CM

## 2022-07-19 DIAGNOSIS — E559 Vitamin D deficiency, unspecified: Secondary | ICD-10-CM

## 2022-07-19 MED ORDER — AMLODIPINE BESYLATE 2.5 MG PO TABS
2.5000 mg | ORAL_TABLET | Freq: Every day | ORAL | 2 refills | Status: DC
Start: 2022-07-19 — End: 2022-12-15

## 2022-07-19 MED ORDER — ROSUVASTATIN CALCIUM 20 MG PO TABS: 20.0000 mg | ORAL_TABLET | Freq: Every day | ORAL | 0 refills | Status: DC

## 2022-07-19 NOTE — Assessment & Plan Note (Signed)
Changing to high potency statin given AA

## 2022-07-19 NOTE — Assessment & Plan Note (Addendum)
Noted on CT angiogram done in 2018 by ED for evaluation of atypical chest pain .LDL is now < 70 on simvastatin and zetia.  Recommending change to high potency statin .  Crestor 20 mg sent to pharmacy   Lab Results  Component Value Date   CHOL 170 01/18/2022   HDL 80.60 01/18/2022   LDLCALC 69 01/18/2022   LDLDIRECT 67.0 01/18/2022   TRIG 101.0 01/18/2022   CHOLHDL 2 01/18/2022

## 2022-07-19 NOTE — Assessment & Plan Note (Signed)
Not at goal on maximal doses of losartan hctz and metoprolol.  Adding amlodipine 2.5 mg daily

## 2022-07-19 NOTE — Progress Notes (Signed)
Subjective:  Patient ID: Jamie Anderson, female    DOB: [birth year]  Age: 87 y.o. MRN: [id number]  CC: The primary encounter diagnosis was Primary hypertension. Diagnoses of Hyperlipidemia LDL goal <70, Iron deficiency anemia, unspecified iron deficiency anemia type, Other fatigue, Aortic atherosclerosis (HCC), Vitamin D deficiency, Paroxysmal A-fib (HCC), Mixed obsessional thoughts and acts, and Essential hypertension were also pertinent to this visit.   HPI Jamie Anderson presents for  Chief Complaint  Patient presents with   Medical Management of Chronic Issues   1) Derm:  started a new program of  Tacrolimus and Dupixent for refractory prurigo nodularis aggravated by neurotic picking at skin (being treated by Lucretia Field).  STILL SCRATCHING   2) Aortic atherosclerosis :  Discussed need for statin therapy given documented evidence of moderate  atherosclerosis in the aorta noted on recent  CT of abdomen and  pelvis and the prognostic implications of this finding.    3) Atrial fibrillation :  taking Eliquis and metoprolol    4) Vit D  taking an unclear amount  5) HTN :  taking losartan, metoprolol and hctz.  Home readings have not been checked lately.     Outpatient Medications Prior to Visit  Medication Sig Dispense Refill   acetaminophen (TYLENOL) 325 MG tablet Take 1-2 tablets (325-650 mg total) by mouth every 6 (six) hours as needed for mild pain (pain score 1-3 or temp > 100.5). 40 tablet 1   albuterol (PROAIR HFA) 108 (90 Base) MCG/ACT inhaler Inhale 1-2 puffs into the lungs every 6 (six) hours as needed for wheezing or shortness of breath. 3 each 0   allopurinol (ZYLOPRIM) 100 MG tablet TAKE 1 TABLET BY MOUTH  DAILY 90 tablet 3   apixaban (ELIQUIS) 5 MG TABS tablet Take 1 tablet (5 mg total) by mouth 2 (two) times daily. 60 tablet 5   calcitRIOL (ROCALTROL) 0.25 MCG capsule Take 0.25 mcg by mouth daily.     calcium carbonate (TUMS - DOSED IN MG ELEMENTAL CALCIUM) 500 MG  chewable tablet Chew 1 tablet by mouth daily as needed for indigestion or heartburn.     cetirizine (ZYRTEC) 10 MG tablet Take 10 mg by mouth daily as needed for allergies.     clobetasol cream (TEMOVATE) 0.05 % Apply to itchy rash on back 1-2 times a day until improved. Avoid face, groin, axilla. 30 g 1   conjugated estrogens (PREMARIN) vaginal cream Place 1 applicator vaginally as needed.     cyanocobalamin (,VITAMIN B-12,) 1000 MCG/ML injection Inject 1 mL (1,000 mcg total) into the muscle every 14 (fourteen) days. 1 mL 1   Dupilumab (DUPIXENT) 300 MG/2ML SOPN Inject 300 mg into the skin every 14 (fourteen) days. Starting at day 15 for maintenance. 4 mL 5   fluocinonide cream (LIDEX) 0.05 % Apply 1 application. topically 2 (two) times daily as needed (itching).     fluticasone (FLONASE) 50 MCG/ACT nasal spray Place 2 sprays into both nostrils daily as needed for allergies or rhinitis.     fluticasone-salmeterol (ADVAIR) 100-50 MCG/ACT AEPB Inhale 1 puff into the lungs 2 (two) times daily as needed. 180 each 1   hydrochlorothiazide (HYDRODIURIL) 25 MG tablet Take 1 tablet (25 mg total) by mouth daily. 90 tablet 3   losartan (COZAAR) 100 MG tablet TAKE 1 TABLET BY MOUTH DAILY 90 tablet 1   Meclizine HCl 25 MG CHEW Chew 1 tablet by mouth 2 (two) times daily.     metoprolol succinate (TOPROL-XL) 25  MG 24 hr tablet TAKE 1 TABLET BY MOUTH AT  BEDTIME 90 tablet 3   pantoprazole (PROTONIX) 40 MG tablet TAKE 1 TABLET BY MOUTH DAILY 90 tablet 3   sertraline (ZOLOFT) 100 MG tablet TAKE 1 TABLET BY MOUTH DAILY 90 tablet 3   tacrolimus (PROTOPIC) 0.1 % ointment Apply to arms/legs at night for itching. 100 g 1   traMADol (ULTRAM) 50 MG tablet Take 50 mg by mouth every 6 (six) hours as needed.     VITAMIN D PO Take 1,000 Int'l Units/day by mouth daily.     ezetimibe (ZETIA) 10 MG tablet TAKE 1 TABLET BY MOUTH DAILY 90 tablet 3   simvastatin (ZOCOR) 40 MG tablet TAKE 1 TABLET BY MOUTH AT  BEDTIME 90 tablet 3    guaiFENesin-codeine (CHERATUSSIN AC) 100-10 MG/5ML syrup Take 5 mLs by mouth 3 (three) times daily as needed for cough. (Patient not taking: Reported on 07/19/2022) 120 mL 0   No facility-administered medications prior to visit.    Review of Systems;  Patient denies headache, fevers, malaise, unintentional weight loss, skin rash, eye pain, sinus congestion and sinus pain, sore throat, dysphagia,  hemoptysis , cough, dyspnea, wheezing, chest pain, palpitations, orthopnea, edema, abdominal pain, nausea, melena, diarrhea, constipation, flank pain, dysuria, hematuria, urinary  Frequency, nocturia, numbness, tingling, seizures,  Focal weakness, Loss of consciousness,  Tremor, insomnia, depression, anxiety, and suicidal ideation.      Objective:  BP (!) 158/60   Pulse 77   Temp 98.3 F (36.8 C) (Oral)   Ht 5\' 4"  (1.626 m)   Wt 189 lb 12.8 oz (86.1 kg)   SpO2 97%   BMI 32.58 kg/m   BP Readings from Last 3 Encounters:  07/19/22 (!) 158/60  06/22/22 136/67  05/18/22 132/82    Wt Readings from Last 3 Encounters:  07/19/22 189 lb 12.8 oz (86.1 kg)  05/18/22 188 lb 12.8 oz (85.6 kg)  04/09/22 187 lb 6.4 oz (85 kg)    Physical Exam Vitals reviewed.  Constitutional:      General: She is not in acute distress.    Appearance: Normal appearance. She is obese. She is not ill-appearing, toxic-appearing or diaphoretic.  HENT:     Head: Normocephalic.  Eyes:     General: No scleral icterus.       Right eye: No discharge.        Left eye: No discharge.     Conjunctiva/sclera: Conjunctivae normal.  Cardiovascular:     Rate and Rhythm: Normal rate and regular rhythm.     Heart sounds: Normal heart sounds.  Pulmonary:     Effort: Pulmonary effort is normal. No respiratory distress.     Breath sounds: Normal breath sounds.  Musculoskeletal:        General: Normal range of motion.  Skin:    General: Skin is warm and dry.     Findings: Erythema and rash present.  Neurological:      General: No focal deficit present.     Mental Status: She is alert and oriented to person, place, and time. Mental status is at baseline.  Psychiatric:        Attention and Perception: Attention normal.        Mood and Affect: Mood normal.        Behavior: Behavior normal.        Thought Content: Thought content normal.        Judgment: Judgment normal.     Comments: Constantly picking at  arms/legs     Lab Results  Component Value Date   HGBA1C 5.5 01/30/2018   HGBA1C 5.4 01/15/2015    Lab Results  Component Value Date   CREATININE 0.97 01/18/2022   CREATININE 0.97 06/08/2021   CREATININE 0.91 05/04/2021    Lab Results  Component Value Date   WBC 5.6 06/08/2021   HGB 9.7 (L) 06/08/2021   HCT 28.9 (L) 06/08/2021   PLT 281.0 06/08/2021   GLUCOSE 84 01/18/2022   CHOL 170 01/18/2022   TRIG 101.0 01/18/2022   HDL 80.60 01/18/2022   LDLDIRECT 67.0 01/18/2022   LDLCALC 69 01/18/2022   ALT 14 01/18/2022   AST 17 01/18/2022   NA 140 01/18/2022   K 4.2 01/18/2022   CL 104 01/18/2022   CREATININE 0.97 01/18/2022   BUN 17 01/18/2022   CO2 29 01/18/2022   TSH 0.947 05/03/2021   INR 0.9 05/29/2012   HGBA1C 5.5 01/30/2018   MICROALBUR <0.7 01/18/2022    MM 3D SCREEN BREAST BILATERAL  Result Date: 02/12/2022 CLINICAL DATA:  Screening. EXAM: DIGITAL SCREENING BILATERAL MAMMOGRAM WITH TOMOSYNTHESIS AND CAD TECHNIQUE: Bilateral screening digital craniocaudal and mediolateral oblique mammograms were obtained. Bilateral screening digital breast tomosynthesis was performed. The images were evaluated with computer-aided detection. COMPARISON:  Previous exam(s). ACR Breast Density Category b: There are scattered areas of fibroglandular density. FINDINGS: There are no findings suspicious for malignancy. IMPRESSION: No mammographic evidence of malignancy. A result letter of this screening mammogram will be mailed directly to the patient. RECOMMENDATION: Screening mammogram in one year.  (Code:SM-B-01Y) BI-RADS CATEGORY  1: Negative. Electronically Signed   By: Sherron Ales M.D.   On: 02/12/2022 13:14    Assessment & Plan:  .Primary hypertension Assessment & Plan: Not at goal on maximal doses of losartan hctz and metoprolol.  Adding amlodipine 2.5 mg daily   Orders: -     Comprehensive metabolic panel  Hyperlipidemia LDL goal <70 Assessment & Plan: Changing to high potency statin given AA  Orders: -     Lipid panel -     LDL cholesterol, direct -     Hemoglobin A1c  Iron deficiency anemia, unspecified iron deficiency anemia type -     CBC with Differential/Platelet  Other fatigue -     TSH  Aortic atherosclerosis (HCC) Assessment & Plan: Noted on CT angiogram done in 2018 by ED for evaluation of atypical chest pain .LDL is now < 70 on simvastatin and zetia.  Recommending change to high potency statin .  Crestor 20 mg sent to pharmacy   Lab Results  Component Value Date   CHOL 170 01/18/2022   HDL 80.60 01/18/2022   LDLCALC 69 01/18/2022   LDLDIRECT 67.0 01/18/2022   TRIG 101.0 01/18/2022   CHOLHDL 2 01/18/2022     Vitamin D deficiency -     VITAMIN D 25 Hydroxy (Vit-D Deficiency, Fractures)  Paroxysmal A-fib (HCC) Assessment & Plan: Occurred Spontaneously post operatively in the setting of post operative anemia,  orthostasis  and resolved spontaneously.  unclear if this was secondary to  anesthesia,  Postoperative anemia  Or other causes.  2D ECHO done in July 2023 is normal. .  She has mild CAD   .  Continue metoprolol and eliquis   Mixed obsessional thoughts and acts Assessment & Plan: Continue sertraline for management of anxiety and OCD>    Essential hypertension Assessment & Plan: Not at goal on maximal doses of losartan hctz and metoprolol.  Adding amlodipine 2.5 mg  daily    Other orders -     Rosuvastatin Calcium; Take 1 tablet (20 mg total) by mouth daily.  Dispense: 90 tablet; Refill: 0 -     amLODIPine Besylate; Take 1 tablet  (2.5 mg total) by mouth daily.  Dispense: 30 tablet; Refill: 2     I provided 31 minutes of face-to-face time during this encounter reviewing patient's last visit with me, patient's  most recent visit with cardiology,  recent surgical and non surgical procedures, previous  labs and imaging studies, counseling on currently addressed issues,  and post visit ordering to diagnostics and therapeutics .   Follow-up: Return in about 6 months (around 01/19/2023).   Sherlene Shams, MD

## 2022-07-19 NOTE — Assessment & Plan Note (Signed)
Occurred Spontaneously post operatively in the setting of post operative anemia,  orthostasis  and resolved spontaneously.  unclear if this was secondary to  anesthesia,  Postoperative anemia  Or other causes.  2D ECHO done in July 2023 is normal. .  She has mild CAD   .  Continue metoprolol and eliquis

## 2022-07-19 NOTE — Patient Instructions (Addendum)
1)  The" aortic atherosclerosis"  that was noted on your  PREVIOUS CT SCAN Places you at increased risk for heart attack and stroke  I recommend that you consider SWITCHING YOUR CHOLESTEROL MEDICATION TO generic  Crestor  AFTER you finish your current supply of Zetia and simvastatin.  This is because Crestor haS been shown to prevent strokes from aortic atherosclerosis   2) YOUR BP IS TOO HIGH. .  I am adding amlodipine 2.5 mg daily for blood pressure.  Continue losartan , metoprolol and hctz  as you are doing    3) You  would like to lose weight,  but there are no magic bullets for weight loss!   Please start a daily walking program  or  use a recumbent bike at home

## 2022-07-19 NOTE — Assessment & Plan Note (Signed)
Continue sertraline for management of anxiety and OCD>

## 2022-07-20 ENCOUNTER — Ambulatory Visit: Payer: Medicare Other | Admitting: Dermatology

## 2022-07-20 LAB — CBC WITH DIFFERENTIAL/PLATELET
Basophils Absolute: 0.1 10*3/uL (ref 0.0–0.1)
Basophils Relative: 0.9 % (ref 0.0–3.0)
Eosinophils Absolute: 0.1 10*3/uL (ref 0.0–0.7)
Eosinophils Relative: 2.3 % (ref 0.0–5.0)
HCT: 32.7 % — ABNORMAL LOW (ref 36.0–46.0)
Hemoglobin: 10.6 g/dL — ABNORMAL LOW (ref 12.0–15.0)
Lymphocytes Relative: 17 % (ref 12.0–46.0)
Lymphs Abs: 1 10*3/uL (ref 0.7–4.0)
MCHC: 32.6 g/dL (ref 30.0–36.0)
MCV: 82.2 fl (ref 78.0–100.0)
Monocytes Absolute: 0.5 10*3/uL (ref 0.1–1.0)
Monocytes Relative: 8 % (ref 3.0–12.0)
Neutro Abs: 4.3 10*3/uL (ref 1.4–7.7)
Neutrophils Relative %: 71.8 % (ref 43.0–77.0)
Platelets: 213 10*3/uL (ref 150.0–400.0)
RBC: 3.97 Mil/uL (ref 3.87–5.11)
RDW: 15.9 % — ABNORMAL HIGH (ref 11.5–15.5)
WBC: 6 10*3/uL (ref 4.0–10.5)

## 2022-07-20 LAB — HEMOGLOBIN A1C: Hgb A1c MFr Bld: 5.6 % (ref 4.6–6.5)

## 2022-07-20 LAB — COMPREHENSIVE METABOLIC PANEL
ALT: 15 U/L (ref 0–35)
AST: 19 U/L (ref 0–37)
Albumin: 3.9 g/dL (ref 3.5–5.2)
Alkaline Phosphatase: 106 U/L (ref 39–117)
BUN: 18 mg/dL (ref 6–23)
CO2: 26 mEq/L (ref 19–32)
Calcium: 9.4 mg/dL (ref 8.4–10.5)
Chloride: 105 mEq/L (ref 96–112)
Creatinine, Ser: 1.07 mg/dL (ref 0.40–1.20)
GFR: 47.62 mL/min — ABNORMAL LOW (ref >60.00)
Glucose, Bld: 81 mg/dL (ref 70–99)
Potassium: 4.1 mEq/L (ref 3.5–5.1)
Sodium: 140 mEq/L (ref 135–145)
Total Bilirubin: 0.3 mg/dL (ref 0.2–1.2)
Total Protein: 6.5 g/dL (ref 6.0–8.3)

## 2022-07-20 LAB — LIPID PANEL
Cholesterol: 159 mg/dL (ref 0–200)
HDL: 77.5 mg/dL (ref >39.00)
LDL Cholesterol: 68 mg/dL (ref 0–99)
NonHDL: 81.74
Total CHOL/HDL Ratio: 2
Triglycerides: 68 mg/dL (ref 0.0–149.0)
VLDL: 13.6 mg/dL (ref 0.0–40.0)

## 2022-07-20 LAB — TSH: TSH: 1.32 u[IU]/mL (ref 0.35–5.50)

## 2022-07-20 LAB — VITAMIN D 25 HYDROXY (VIT D DEFICIENCY, FRACTURES): VITD: 25.04 ng/mL — ABNORMAL LOW (ref 30.00–100.00)

## 2022-07-20 LAB — LDL CHOLESTEROL, DIRECT: Direct LDL: 59 mg/dL

## 2022-07-22 ENCOUNTER — Other Ambulatory Visit: Payer: Self-pay

## 2022-07-22 MED ORDER — DUPIXENT 300 MG/2ML ~~LOC~~ SOAJ: 300.0000 mg | SUBCUTANEOUS | 5 refills | Status: DC

## 2022-07-22 NOTE — Progress Notes (Signed)
Resending Rx per [facility] fax to do so. Escripted

## 2022-07-24 NOTE — Addendum Note (Signed)
Addended by: Sherlene Shams on: 07/24/2022 05:04 PM   Modules accepted: Orders

## 2022-07-26 ENCOUNTER — Other Ambulatory Visit: Payer: Self-pay

## 2022-07-26 MED ORDER — DUPIXENT 300 MG/2ML ~~LOC~~ SOAJ: 300.0000 mg | SUBCUTANEOUS | 5 refills | Status: DC

## 2022-07-26 NOTE — Progress Notes (Signed)
Refill request faxed from [facility]. Escripted  

## 2022-07-27 ENCOUNTER — Ambulatory Visit (INDEPENDENT_AMBULATORY_CARE_PROVIDER_SITE_OTHER): Payer: Medicare Other

## 2022-07-27 ENCOUNTER — Other Ambulatory Visit (INDEPENDENT_AMBULATORY_CARE_PROVIDER_SITE_OTHER): Payer: Medicare Other

## 2022-07-27 DIAGNOSIS — D509 Iron deficiency anemia, unspecified: Secondary | ICD-10-CM

## 2022-07-27 DIAGNOSIS — R944 Abnormal results of kidney function studies: Secondary | ICD-10-CM

## 2022-07-27 DIAGNOSIS — E538 Deficiency of other specified B group vitamins: Secondary | ICD-10-CM

## 2022-07-27 DIAGNOSIS — L281 Prurigo nodularis: Secondary | ICD-10-CM

## 2022-07-27 MED ORDER — DUPILUMAB 300 MG/2ML ~~LOC~~ SOSY
300.0000 mg | PREFILLED_SYRINGE | Freq: Once | SUBCUTANEOUS | Status: AC
Start: 2022-07-27 — End: 2022-07-27
  Administered 2022-07-27: 300 mg via SUBCUTANEOUS

## 2022-07-27 NOTE — Progress Notes (Signed)
Patient here today for Dupixent injection for chronic and persistent prurigo nodules.    Dupixent 300mg /85mL injected into right upper thigh. Patient tolerated okay. LOT: [id number] EXP: 10/2024 NDC: 6962-9528-41   Dorathy Daft, RMA

## 2022-07-28 LAB — IRON,TIBC AND FERRITIN PANEL
%SAT: 12 % (calc) — ABNORMAL LOW (ref 16–45)
Ferritin: 8 ng/mL — ABNORMAL LOW (ref 16–288)
Iron: 42 ug/dL — ABNORMAL LOW (ref 45–160)
TIBC: 356 mcg/dL (calc) (ref 250–450)

## 2022-07-28 LAB — B12 AND FOLATE PANEL
Folate: 15.8 ng/mL (ref >5.9)
Vitamin B-12: 282 pg/mL (ref 211–911)

## 2022-07-28 LAB — BASIC METABOLIC PANEL
BUN: 24 mg/dL — ABNORMAL HIGH (ref 6–23)
CO2: 27 mEq/L (ref 19–32)
Calcium: 9.5 mg/dL (ref 8.4–10.5)
Chloride: 103 mEq/L (ref 96–112)
Creatinine, Ser: 1.24 mg/dL — ABNORMAL HIGH (ref 0.40–1.20)
GFR: 39.89 mL/min — ABNORMAL LOW (ref >60.00)
Glucose, Bld: 88 mg/dL (ref 70–99)
Potassium: 4.1 mEq/L (ref 3.5–5.1)
Sodium: 138 mEq/L (ref 135–145)

## 2022-07-28 NOTE — Addendum Note (Signed)
Addended by: Sherlene Shams on: 07/28/2022 10:39 PM   Modules accepted: Orders

## 2022-07-29 ENCOUNTER — Ambulatory Visit: Payer: Medicare Other

## 2022-07-30 ENCOUNTER — Telehealth: Payer: Self-pay | Admitting: Internal Medicine

## 2022-07-30 NOTE — Telephone Encounter (Signed)
Patient returned our call.  I read message from Dr. Duncan Dull and scheduled patient's appointments per Dr. Melina Schools instructions.

## 2022-07-30 NOTE — Telephone Encounter (Signed)
Warden Fillers, CMA 07/30/2022  3:15 PM EDT Back to Top    Left voicemail to return call   Sherlene Shams, MD 07/28/2022 10:38 PM EDT     Her iron and b12 are low.  Please schedule weekly b12 injections x 4,  with a lab visit after first one for intrinsic factor ab test to determine if long term injections are needed

## 2022-08-03 ENCOUNTER — Ambulatory Visit (INDEPENDENT_AMBULATORY_CARE_PROVIDER_SITE_OTHER): Payer: Medicare Other

## 2022-08-03 DIAGNOSIS — E538 Deficiency of other specified B group vitamins: Secondary | ICD-10-CM

## 2022-08-03 MED ORDER — CYANOCOBALAMIN 1000 MCG/ML IJ SOLN
1000.0000 ug | Freq: Once | INTRAMUSCULAR | Status: AC
Start: 2022-08-03 — End: 2022-08-03
  Administered 2022-08-03: 1000 ug via INTRAMUSCULAR

## 2022-08-03 NOTE — Progress Notes (Signed)
After obtaining consent, and per orders of Dr. Tullo, injection of B-12 given IM in left deltoid by Aubrie Lucien Lynn. Patient tolerated injection well.  

## 2022-08-06 ENCOUNTER — Other Ambulatory Visit (INDEPENDENT_AMBULATORY_CARE_PROVIDER_SITE_OTHER): Payer: Medicare Other

## 2022-08-06 DIAGNOSIS — E538 Deficiency of other specified B group vitamins: Secondary | ICD-10-CM

## 2022-08-08 LAB — INTRINSIC FACTOR ANTIBODIES: Intrinsic Factor: NEGATIVE

## 2022-08-10 ENCOUNTER — Ambulatory Visit (INDEPENDENT_AMBULATORY_CARE_PROVIDER_SITE_OTHER): Payer: Medicare Other

## 2022-08-10 DIAGNOSIS — E538 Deficiency of other specified B group vitamins: Secondary | ICD-10-CM

## 2022-08-10 MED ORDER — CYANOCOBALAMIN 1000 MCG/ML IJ SOLN
1000.0000 ug | Freq: Once | INTRAMUSCULAR | Status: AC
Start: 2022-08-10 — End: 2022-08-10
  Administered 2022-08-10: 1000 ug via INTRAMUSCULAR

## 2022-08-10 NOTE — Progress Notes (Signed)
Patient presented for B 12 injection to left deltoid, patient voiced no concerns nor showed any signs of distress during injection. 

## 2022-08-11 ENCOUNTER — Telehealth: Payer: Self-pay

## 2022-08-11 NOTE — Telephone Encounter (Signed)
Patient is wanting to know could she get a B12 injection by PCP the same day as her Dupixent injection?

## 2022-08-12 NOTE — Telephone Encounter (Signed)
Patient advised of information per Dr. Stewart. aw °

## 2022-08-17 DIAGNOSIS — Z961 Presence of intraocular lens: Secondary | ICD-10-CM | POA: Diagnosis not present

## 2022-08-17 DIAGNOSIS — H43813 Vitreous degeneration, bilateral: Secondary | ICD-10-CM | POA: Diagnosis not present

## 2022-08-17 DIAGNOSIS — H04123 Dry eye syndrome of bilateral lacrimal glands: Secondary | ICD-10-CM | POA: Diagnosis not present

## 2022-08-18 ENCOUNTER — Ambulatory Visit: Payer: Medicare Other | Admitting: Dermatology

## 2022-08-18 VITALS — BP 142/69 | HR 72

## 2022-08-18 DIAGNOSIS — W908XXA Exposure to other nonionizing radiation, initial encounter: Secondary | ICD-10-CM

## 2022-08-18 DIAGNOSIS — Z79899 Other long term (current) drug therapy: Secondary | ICD-10-CM | POA: Diagnosis not present

## 2022-08-18 DIAGNOSIS — L281 Prurigo nodularis: Secondary | ICD-10-CM | POA: Diagnosis not present

## 2022-08-18 DIAGNOSIS — L578 Other skin changes due to chronic exposure to nonionizing radiation: Secondary | ICD-10-CM | POA: Diagnosis not present

## 2022-08-18 DIAGNOSIS — L821 Other seborrheic keratosis: Secondary | ICD-10-CM

## 2022-08-18 DIAGNOSIS — L209 Atopic dermatitis, unspecified: Secondary | ICD-10-CM | POA: Diagnosis not present

## 2022-08-18 DIAGNOSIS — X32XXXA Exposure to sunlight, initial encounter: Secondary | ICD-10-CM | POA: Diagnosis not present

## 2022-08-18 MED ORDER — ADBRY 150 MG/ML ~~LOC~~ SOSY: 600.0000 mg | PREFILLED_SYRINGE | Freq: Once | SUBCUTANEOUS | 0 refills | Status: AC

## 2022-08-18 MED ORDER — CLOBETASOL PROPIONATE 0.05 % EX SOLN: CUTANEOUS | 1 refills | Status: DC

## 2022-08-18 MED ORDER — ADBRY 150 MG/ML ~~LOC~~ SOSY: 300.0000 mg | PREFILLED_SYRINGE | SUBCUTANEOUS | 5 refills | Status: DC

## 2022-08-18 MED ORDER — DUPILUMAB 300 MG/2ML ~~LOC~~ SOPN
300.0000 mg | PEN_INJECTOR | Freq: Once | SUBCUTANEOUS | Status: AC
Start: 2022-08-18 — End: 2022-08-18
  Administered 2022-08-18: 300 mg via SUBCUTANEOUS

## 2022-08-18 NOTE — Progress Notes (Signed)
Follow-Up Visit   Subjective  Jamie Anderson is a 87 y.o. female who presents for the following: prurigo nodularis - currently using Tacrolimus ointment and Dupixent 300mg .2mL SQ Q2W. Pt thinks she's improving but then starting itching and scratching again, so she is unsure if medications are helping. - hx of asthma and food allergies    The following portions of the chart were reviewed this encounter and updated as appropriate: medications, allergies, medical history  Review of Systems:  No other skin or systemic complaints except as noted in HPI or Assessment and Plan.  Objective  Well appearing patient in no apparent distress; mood and affect are within normal limits.   A focused examination was performed of the following areas: the arms and legs   Relevant exam findings are noted in the Assessment and Plan.    Assessment & Plan   ATOPIC DERMATITIS with prurigo nodularis  Exam: multiple pink excoriated papules at arms, legs, and post neck 20% BSA  Chronic and persistent condition with duration or expected duration over one year. Condition is symptomatic/ bothersome to patient. Not currently at goal. Has flared on Dupixent with increased itching.  Atopic dermatitis (eczema) is a chronic, relapsing, pruritic condition that can significantly affect quality of life. It is often associated with allergic rhinitis and/or asthma and can require treatment with topical medications, phototherapy, or in severe cases biologic injectable medication (Dupixent; Adbry) or Oral JAK inhibitors.  Treatment Plan:  Will continue with Dupixent 300mg /6mL SQ QOW, but will send in Adbry to see if improved by insurance since patient isn't completely clearing with Dupixent.   Dupixent 300mg /24mL pen injected SQ into the L ant thigh. Patient tolerated injection well. AL, CMA Dupilumab (Dupixent) is a treatment given by injection for adults and children with moderate-to-severe atopic dermatitis. Goal is  control of skin condition, not cure. It is given as 2 injections at the first dose followed by 1 injection ever 2 weeks thereafter.  Young children are dosed monthly.  Potential side effects include allergic reaction, herpes infections, injection site reactions and conjunctivitis (inflammation of the eyes).  The use of Dupixent requires long term medication management, including periodic office visits.    Continue Tacrolimus 0.1% ointment to aa's BID.   Start Eczema Skin Care  Buy TWO 16oz jars of CeraVe moisturizing cream  [facility], [facility], [facility] (no prescription needed)  Costs about $15 per jar   Jar #1: Use as a moisturizer as needed. Can be applied to any area of the body. Use twice daily to unaffected areas.  Jar #2: Pour one 50ml bottle of clobetasol 0.05% solution into jar, mix well. Label this jar to indicate the medication has been added. Use twice daily to affected areas. Do not apply to face, groin or underarms.  Moisturizer may burn or sting initially. Try for at least 4 weeks.   Recommend gentle skin care.  Discussed adding NBUVB.    ACTINIC DAMAGE - chronic, secondary to cumulative UV radiation exposure/sun exposure over time - diffuse scaly erythematous macules with underlying dyspigmentation - Recommend daily broad spectrum sunscreen SPF 30+ to sun-exposed areas, reapply every 2 hours as needed.  - Recommend staying in the shade or wearing long sleeves, sun glasses (UVA+UVB protection) and wide brim hats (4-inch brim around the entire circumference of the hat). - Call for new or changing lesions.  SEBORRHEIC KERATOSIS - Stuck-on, waxy, tan-brown papules and/or plaques  - Benign-appearing - Discussed benign etiology and prognosis. - Observe - Call for  any changes  Return in about 2 weeks (around 09/01/2022) for Dupixent injection.  Maylene Roes, CMA, am acting as scribe for Willeen Niece, MD .  Documentation: I have reviewed the above documentation for  accuracy and completeness, and I agree with the above.  Willeen Niece, MD

## 2022-08-18 NOTE — Patient Instructions (Addendum)
Eczema Skin Care  Buy TWO 16oz jars of CeraVe moisturizing cream  [facility], [facility], [facility] (no prescription needed)  Costs about $15 per jar   Jar #1: Use as a moisturizer as needed. Can be applied to any area of the body. Use twice daily to unaffected areas.  Jar #2: Pour one 50ml bottle of clobetasol 0.05% solution into jar, mix well. Label this jar to indicate the medication has been added. Use twice daily to affected areas. Do not apply to face, groin or underarms.  Moisturizer may burn or sting initially. Try for at least 4 weeks.         Gentle Skin Care Guide  1. Bathe no more than once a day.  2. Avoid bathing in hot water  3. Use a mild soap like Dove, Vanicream, Cetaphil, CeraVe. Can use Lever 2000 or Cetaphil antibacterial soap  4. Use soap only where you need it. On most days, use it under your arms, between your legs, and on your feet. Let the water rinse other areas unless visibly dirty.  5. When you get out of the bath/shower, use a towel to gently blot your skin dry, don't rub it.  6. While your skin is still a little damp, apply a moisturizing cream such as Vanicream, CeraVe, Cetaphil, Eucerin, Sarna lotion or plain Vaseline Jelly. For hands apply Neutrogena Norwegian Hand Cream or Excipial Hand Cream.  7. Reapply moisturizer any time you start to itch or feel dry.  8. Sometimes using free and clear laundry detergents can be helpful. Fabric softener sheets should be avoided. Downy Free & Gentle liquid, or any liquid fabric softener that is free of dyes and perfumes, it acceptable to use  9. If your doctor has given you prescription creams you may apply moisturizers over them       Due to recent changes in healthcare laws, you may see results of your pathology and/or laboratory studies on MyChart before the doctors have had a chance to review them. We understand that in some cases there may be results that are confusing or concerning to you. Please understand  that not all results are received at the same time and often the doctors may need to interpret multiple results in order to provide you with the best plan of care or course of treatment. Therefore, we ask that you please give us 2 business days to thoroughly review all your results before contacting the office for clarification. Should we see a critical lab result, you will be contacted sooner.   If You Need Anything After Your Visit  If you have any questions or concerns for your doctor, please call our main line at [phone] and press option 4 to reach your doctor's medical assistant. If no one answers, please leave a voicemail as directed and we will return your call as soon as possible. Messages left after 4 pm will be answered the following business day.   You may also send us a message via MyChart. We typically respond to MyChart messages within 1-2 business days.  For prescription refills, please ask your pharmacy to contact our office. Our fax number is [phone].  If you have an urgent issue when the clinic is closed that cannot wait until the next business day, you can page your doctor at the number below.    Please note that while we do our best to be available for urgent issues outside of office hours, we are not available 24/7.   If you have   an urgent issue and are unable to reach us, you may choose to seek medical care at your doctor's office, retail clinic, urgent care center, or emergency room.  If you have a medical emergency, please immediately call 911 or go to the emergency department.  Pager Numbers  - Dr. Kowalski: [phone]  - Dr. Moye: [phone]  - Dr. Stewart: [phone]  In the event of inclement weather, please call our main line at [phone] for an update on the status of any delays or closures.  Dermatology Medication Tips: Please keep the boxes that topical medications come in in order to help keep track of the instructions about where and  how to use these. Pharmacies typically print the medication instructions only on the boxes and not directly on the medication tubes.   If your medication is too expensive, please contact our office at [phone] option 4 or send us a message through MyChart.   We are unable to tell what your co-pay for medications will be in advance as this is different depending on your insurance coverage. However, we may be able to find a substitute medication at lower cost or fill out paperwork to get insurance to cover a needed medication.   If a prior authorization is required to get your medication covered by your insurance company, please allow us 1-2 business days to complete this process.  Drug prices often vary depending on where the prescription is filled and some pharmacies may offer cheaper prices.  The website www.goodrx.com contains coupons for medications through different pharmacies. The prices here do not account for what the cost may be with help from insurance (it may be cheaper with your insurance), but the website can give you the price if you did not use any insurance.  - You can print the associated coupon and take it with your prescription to the pharmacy.  - You may also stop by our office during regular business hours and pick up a GoodRx coupon card.  - If you need your prescription sent electronically to a different pharmacy, notify our office through [facility] MyChart or by phone at [phone] option 4.     Si Usted Necesita Algo Despus de Su Visita  Tambin puede enviarnos un mensaje a travs de MyChart. Por lo general respondemos a los mensajes de MyChart en el transcurso de 1 a 2 das hbiles.  Para renovar recetas, por favor pida a su farmacia que se ponga en contacto con nuestra oficina. Nuestro nmero de fax es el [phone].  Si tiene un asunto urgente cuando la clnica est cerrada y que no puede esperar hasta el siguiente da hbil, puede llamar/localizar a su  doctor(a) al nmero que aparece a continuacin.   Por favor, tenga en cuenta que aunque hacemos todo lo posible para estar disponibles para asuntos urgentes fuera del horario de oficina, no estamos disponibles las 24 horas del da, los 7 das de la semana.   Si tiene un problema urgente y no puede comunicarse con nosotros, puede optar por buscar atencin mdica  en el consultorio de su doctor(a), en una clnica privada, en un centro de atencin urgente o en una sala de emergencias.  Si tiene una emergencia mdica, por favor llame inmediatamente al 911 o vaya a la sala de emergencias.  Nmeros de bper  - Dr. Kowalski: [phone]  - Dra. Moye: [phone]  - Dra. Stewart: [phone]  En caso de inclemencias del tiempo, por favor llame a nuestra lnea principal al [phone] para   una actualizacin sobre el estado de cualquier retraso o cierre.  Consejos para la medicacin en dermatologa: Por favor, guarde las cajas en las que vienen los medicamentos de uso tpico para ayudarle a seguir las instrucciones sobre dnde y cmo usarlos. Las farmacias generalmente imprimen las instrucciones del medicamento slo en las cajas y no directamente en los tubos del medicamento.   Si su medicamento es muy caro, por favor, pngase en contacto con nuestra oficina llamando al [phone] y presione la opcin 4 o envenos un mensaje a travs de MyChart.   No podemos decirle cul ser su copago por los medicamentos por adelantado ya que esto es diferente dependiendo de la cobertura de su seguro. Sin embargo, es posible que podamos encontrar un medicamento sustituto a menor costo o llenar un formulario para que el seguro cubra el medicamento que se considera necesario.   Si se requiere una autorizacin previa para que su compaa de seguros cubra su medicamento, por favor permtanos de 1 a 2 das hbiles para completar este proceso.  Los precios de los medicamentos varan con frecuencia dependiendo del  lugar de dnde se surte la receta y alguna farmacias pueden ofrecer precios ms baratos.  El sitio web www.goodrx.com tiene cupones para medicamentos de diferentes farmacias. Los precios aqu no tienen en cuenta lo que podra costar con la ayuda del seguro (puede ser ms barato con su seguro), pero el sitio web puede darle el precio si no utiliz ningn seguro.  - Puede imprimir el cupn correspondiente y llevarlo con su receta a la farmacia.  - Tambin puede pasar por nuestra oficina durante el horario de atencin regular y recoger una tarjeta de cupones de GoodRx.  - Si necesita que su receta se enve electrnicamente a una farmacia diferente, informe a nuestra oficina a travs de MyChart de [facility] o por telfono llamando al [phone] y presione la opcin 4.  

## 2022-08-19 ENCOUNTER — Ambulatory Visit (INDEPENDENT_AMBULATORY_CARE_PROVIDER_SITE_OTHER): Payer: Medicare Other

## 2022-08-19 DIAGNOSIS — E538 Deficiency of other specified B group vitamins: Secondary | ICD-10-CM

## 2022-08-19 MED ORDER — CYANOCOBALAMIN 1000 MCG/ML IJ SOLN
1000.0000 ug | Freq: Once | INTRAMUSCULAR | Status: AC
Start: 2022-08-19 — End: 2022-08-19
  Administered 2022-08-19: 1000 ug via INTRAMUSCULAR

## 2022-08-19 NOTE — Progress Notes (Signed)
Patient arrived for a B12 injection and it was administered into her right deltoid. Patient tolerated the injection well and did not show any signs of distress or voice any concerns. 

## 2022-08-26 ENCOUNTER — Ambulatory Visit: Payer: Medicare Other

## 2022-08-26 ENCOUNTER — Ambulatory Visit (INDEPENDENT_AMBULATORY_CARE_PROVIDER_SITE_OTHER): Payer: Medicare Other

## 2022-08-26 DIAGNOSIS — E538 Deficiency of other specified B group vitamins: Secondary | ICD-10-CM

## 2022-08-26 MED ORDER — CYANOCOBALAMIN 1000 MCG/ML IJ SOLN
1000.0000 ug | Freq: Once | INTRAMUSCULAR | Status: AC
Start: 2022-08-26 — End: 2022-08-26
  Administered 2022-08-26: 1000 ug via INTRAMUSCULAR

## 2022-08-26 NOTE — Progress Notes (Signed)
After obtaining consent, and per orders of Dr. Walsh, injection of b-12 given IM in left deltoid by Makailah Slavick Lynn. Patient tolerated injection well.   

## 2022-08-27 ENCOUNTER — Ambulatory Visit (INDEPENDENT_AMBULATORY_CARE_PROVIDER_SITE_OTHER): Payer: Medicare Other

## 2022-08-27 VITALS — Ht 64.0 in | Wt 189.0 lb

## 2022-08-27 DIAGNOSIS — Z0000 Encounter for general adult medical examination without abnormal findings: Secondary | ICD-10-CM | POA: Diagnosis not present

## 2022-08-27 NOTE — Patient Instructions (Signed)
Jamie Anderson , Thank you for taking time to come for your Medicare Wellness Visit. I appreciate your ongoing commitment to your health goals. Please review the following plan we discussed and let me know if I can assist you in the future.   These are the goals we discussed:  Goals       Healthy Lifestyle (pt-stated)      Walk more for exercise Monitor diet better        This is a list of the screening recommended for you and due dates:  Health Maintenance  Topic Date Due   COVID-19 Vaccine (5 - 2023-24 season) 11/06/2021   Zoster (Shingles) Vaccine (1 of 2) 11/27/2022*   Flu Shot  10/07/2022   Medicare Annual Wellness Visit  08/27/2023   DTaP/Tdap/Td vaccine (2 - Td or Tdap) 12/07/2023   Pneumonia Vaccine  Completed   DEXA scan (bone density measurement)  Completed   HPV Vaccine  Aged Out  *Topic was postponed. The date shown is not the original due date.    Advanced directives: Information on Advanced Care Planning can be found at [facility] Advance Health Care Directives Advance Health Care Directives ([email or web address].[email or web address]/) Aim for 30 minutes of exercise or brisk walking, 6-8 glasses of water, and 5 servings of fruits and vegetables each day.  Conditions/risks identified: Aim for 30 minutes of exercise or brisk walking, 6-8 glasses of water, and 5 servings of fruits and vegetables each day.  Next appointment: Follow up in one year for your annual wellness visit    Preventive Care 65 Years and Older, Female Preventive care refers to lifestyle choices and visits with your health care provider that can promote health and wellness. What does preventive care include? A yearly physical exam. This is also called an annual well check. Dental exams once or twice a year. Routine eye exams. Ask your health care provider how often you should have your eyes checked. Personal lifestyle choices, including: Daily care of your teeth and gums. Regular physical activity. Eating a  healthy diet. Avoiding tobacco and drug use. Limiting alcohol use. Practicing safe sex. Taking low-dose aspirin every day. Taking vitamin and mineral supplements as recommended by your health care provider. What happens during an annual well check? The services and screenings done by your health care provider during your annual well check will depend on your age, overall health, lifestyle risk factors, and family history of disease. Counseling  Your health care provider may ask you questions about your: Alcohol use. Tobacco use. Drug use. Emotional well-being. Home and relationship well-being. Sexual activity. Eating habits. History of falls. Memory and ability to understand (cognition). Work and work Astronomer. Reproductive health. Screening  You may have the following tests or measurements: Height, weight, and BMI. Blood pressure. Lipid and cholesterol levels. These may be checked every 5 years, or more frequently if you are over 53 years old. Skin check. Lung cancer screening. You may have this screening every year starting at age 24 if you have a 30-pack-year history of smoking and currently smoke or have quit within the past 15 years. Fecal occult blood test (FOBT) of the stool. You may have this test every year starting at age 33. Flexible sigmoidoscopy or colonoscopy. You may have a sigmoidoscopy every 5 years or a colonoscopy every 10 years starting at age 33. Hepatitis C blood test. Hepatitis B blood test. Sexually transmitted disease (STD) testing. Diabetes screening. This is done by checking your blood sugar (glucose) after  you have not eaten for a while (fasting). You may have this done every 1-3 years. Bone density scan. This is done to screen for osteoporosis. You may have this done starting at age 82. Mammogram. This may be done every 1-2 years. Talk to your health care provider about how often you should have regular mammograms. Talk with your health care  provider about your test results, treatment options, and if necessary, the need for more tests. Vaccines  Your health care provider may recommend certain vaccines, such as: Influenza vaccine. This is recommended every year. Tetanus, diphtheria, and acellular pertussis (Tdap, Td) vaccine. You may need a Td booster every 10 years. Zoster vaccine. You may need this after age 3. Pneumococcal 13-valent conjugate (PCV13) vaccine. One dose is recommended after age 75. Pneumococcal polysaccharide (PPSV23) vaccine. One dose is recommended after age 19. Talk to your health care provider about which screenings and vaccines you need and how often you need them. This information is not intended to replace advice given to you by your health care provider. Make sure you discuss any questions you have with your health care provider. Document Released: 03/21/2015 Document Revised: 11/12/2015 Document Reviewed: 12/24/2014 Elsevier Interactive Patient Education  2017 [employer].  Fall Prevention in the Home Falls can cause injuries. They can happen to people of all ages. There are many things you can do to make your home safe and to help prevent falls. What can I do on the outside of my home? Regularly fix the edges of walkways and driveways and fix any cracks. Remove anything that might make you trip as you walk through a door, such as a raised step or threshold. Trim any bushes or trees on the path to your home. Use bright outdoor lighting. Clear any walking paths of anything that might make someone trip, such as rocks or tools. Regularly check to see if handrails are loose or broken. Make sure that both sides of any steps have handrails. Any raised decks and porches should have guardrails on the edges. Have any leaves, snow, or ice cleared regularly. Use sand or salt on walking paths during winter. Clean up any spills in your garage right away. This includes oil or grease spills. What can I do in the  bathroom? Use night lights. Install grab bars by the toilet and in the tub and shower. Do not use towel bars as grab bars. Use non-skid mats or decals in the tub or shower. If you need to sit down in the shower, use a plastic, non-slip stool. Keep the floor dry. Clean up any water that spills on the floor as soon as it happens. Remove soap buildup in the tub or shower regularly. Attach bath mats securely with double-sided non-slip rug tape. Do not have throw rugs and other things on the floor that can make you trip. What can I do in the bedroom? Use night lights. Make sure that you have a light by your bed that is easy to reach. Do not use any sheets or blankets that are too big for your bed. They should not hang down onto the floor. Have a firm chair that has side arms. You can use this for support while you get dressed. Do not have throw rugs and other things on the floor that can make you trip. What can I do in the kitchen? Clean up any spills right away. Avoid walking on wet floors. Keep items that you use a lot in easy-to-reach places. If you  need to reach something above you, use a strong step stool that has a grab bar. Keep electrical cords out of the way. Do not use floor polish or wax that makes floors slippery. If you must use wax, use non-skid floor wax. Do not have throw rugs and other things on the floor that can make you trip. What can I do with my stairs? Do not leave any items on the stairs. Make sure that there are handrails on both sides of the stairs and use them. Fix handrails that are broken or loose. Make sure that handrails are as long as the stairways. Check any carpeting to make sure that it is firmly attached to the stairs. Fix any carpet that is loose or worn. Avoid having throw rugs at the top or bottom of the stairs. If you do have throw rugs, attach them to the floor with carpet tape. Make sure that you have a light switch at the top of the stairs and the  bottom of the stairs. If you do not have them, ask someone to add them for you. What else can I do to help prevent falls? Wear shoes that: Do not have high heels. Have rubber bottoms. Are comfortable and fit you well. Are closed at the toe. Do not wear sandals. If you use a stepladder: Make sure that it is fully opened. Do not climb a closed stepladder. Make sure that both sides of the stepladder are locked into place. Ask someone to hold it for you, if possible. Clearly mark and make sure that you can see: Any grab bars or handrails. First and last steps. Where the edge of each step is. Use tools that help you move around (mobility aids) if they are needed. These include: Canes. Walkers. Scooters. Crutches. Turn on the lights when you go into a dark area. Replace any light bulbs as soon as they burn out. Set up your furniture so you have a clear path. Avoid moving your furniture around. If any of your floors are uneven, fix them. If there are any pets around you, be aware of where they are. Review your medicines with your doctor. Some medicines can make you feel dizzy. This can increase your chance of falling. Ask your doctor what other things that you can do to help prevent falls. This information is not intended to replace advice given to you by your health care provider. Make sure you discuss any questions you have with your health care provider. Document Released: 12/19/2008 Document Revised: 07/31/2015 Document Reviewed: 03/29/2014 Elsevier Interactive Patient Education  2017 [employer].

## 2022-08-27 NOTE — Progress Notes (Cosign Needed Addendum)
Subjective:   Jamie Anderson is a 87 y.o. female who presents for Medicare Annual (Subsequent) preventive examination.  Visit Complete: Virtual  I connected with  Jamie Anderson on 08/27/22 by a audio enabled telemedicine application and verified that I am speaking with the correct person using two identifiers.  Patient Location: Home  Provider Location: Home Office  I discussed the limitations of evaluation and management by telemedicine. The patient expressed understanding and agreed to proceed.  Review of Systems     Cardiac Risk Factors include: advanced age (>42men, >15 women);dyslipidemia;hypertension     Objective:    Today's Vitals   08/27/22 1531  Weight: 189 lb (85.7 kg)  Height: 5\' 4"  (1.626 m)   Body mass index is 32.44 kg/m.     08/27/2022    3:46 PM 08/24/2021    3:58 PM 04/29/2021    1:23 PM 04/16/2021   12:49 PM 08/22/2020    4:01 PM 08/22/2019    1:54 PM 08/17/2018   12:37 PM  Advanced Directives  Does Patient Have a Medical Advance Directive? No Yes Yes Yes Yes Yes Yes  Type of Furniture conservator/restorer;Living will Healthcare Power of [place];Living will Healthcare Power of [place];Living will Healthcare Power of [place];Living will Healthcare Power of [place];Living will Living will;Healthcare Power of Attorney  Does patient want to make changes to medical advance directive?  No - Patient declined No - Patient declined No - Patient declined No - Patient declined No - Patient declined No - Patient declined  Copy of Healthcare Power of Attorney in Chart?  No - copy requested No - copy requested No - copy requested No - copy requested No - copy requested No - copy requested  Would patient like information on creating a medical advance directive? Yes (MAU/Ambulatory/Procedural Areas - Information given)          Current Medications (verified) Outpatient Encounter Medications as of 08/27/2022  Medication Sig   acetaminophen (TYLENOL)  325 MG tablet Take 1-2 tablets (325-650 mg total) by mouth every 6 (six) hours as needed for mild pain (pain score 1-3 or temp > 100.5).   albuterol (PROAIR HFA) 108 (90 Base) MCG/ACT inhaler Inhale 1-2 puffs into the lungs every 6 (six) hours as needed for wheezing or shortness of breath.   allopurinol (ZYLOPRIM) 100 MG tablet TAKE 1 TABLET BY MOUTH  DAILY   amLODipine (NORVASC) 2.5 MG tablet Take 1 tablet (2.5 mg total) by mouth daily.   apixaban (ELIQUIS) 5 MG TABS tablet Take 1 tablet (5 mg total) by mouth 2 (two) times daily.   calcitRIOL (ROCALTROL) 0.25 MCG capsule Take 0.25 mcg by mouth daily.   calcium carbonate (TUMS - DOSED IN MG ELEMENTAL CALCIUM) 500 MG chewable tablet Chew 1 tablet by mouth daily as needed for indigestion or heartburn.   cetirizine (ZYRTEC) 10 MG tablet Take 10 mg by mouth daily as needed for allergies.   clobetasol (TEMOVATE) 0.05 % external solution Mix into CeraVe cream as instructed (pt has written instructions) apply to rash BID PRN. Avoid applying to face, groin, and axilla. Use as directed. Long-term use can cause thinning of the skin.   conjugated estrogens (PREMARIN) vaginal cream Place 1 applicator vaginally as needed.   cyanocobalamin (,VITAMIN B-12,) 1000 MCG/ML injection Inject 1 mL (1,000 mcg total) into the muscle every 14 (fourteen) days.   Dupilumab (DUPIXENT) 300 MG/2ML SOPN Inject 300 mg into the skin every 14 (fourteen) days. Starting at day  15 for maintenance.   fluocinonide cream (LIDEX) 0.05 % Apply 1 application. topically 2 (two) times daily as needed (itching).   fluticasone (FLONASE) 50 MCG/ACT nasal spray Place 2 sprays into both nostrils daily as needed for allergies or rhinitis.   fluticasone-salmeterol (ADVAIR) 100-50 MCG/ACT AEPB Inhale 1 puff into the lungs 2 (two) times daily as needed.   hydrochlorothiazide (HYDRODIURIL) 25 MG tablet Take 1 tablet (25 mg total) by mouth daily.   losartan (COZAAR) 100 MG tablet TAKE 1 TABLET BY MOUTH  DAILY   Meclizine HCl 25 MG CHEW Chew 1 tablet by mouth 2 (two) times daily.   metoprolol succinate (TOPROL-XL) 25 MG 24 hr tablet TAKE 1 TABLET BY MOUTH AT  BEDTIME   pantoprazole (PROTONIX) 40 MG tablet TAKE 1 TABLET BY MOUTH DAILY   rosuvastatin (CRESTOR) 20 MG tablet Take 1 tablet (20 mg total) by mouth daily.   sertraline (ZOLOFT) 100 MG tablet TAKE 1 TABLET BY MOUTH DAILY   tacrolimus (PROTOPIC) 0.1 % ointment Apply to arms/legs at night for itching.   Tralokinumab-ldrm (ADBRY) 150 MG/ML SOSY Inject 2 mLs (300 mg total) into the skin every 14 (fourteen) days. Starting on day 15 for maintenance.   traMADol (ULTRAM) 50 MG tablet Take 50 mg by mouth every 6 (six) hours as needed.   VITAMIN D PO Take 1,000 Int'l Units/day by mouth daily.   No facility-administered encounter medications on file as of 08/27/2022.    Allergies (verified) Almond mea, Amoxicillin-pot clavulanate, Calcitriol, Clarithromycin, Colloidal oatmeal, Dust mite extract, Gluten meal, Molds & smuts, Oat, Other, and Shrimp [shellfish allergy]   History: Past Medical History:  Diagnosis Date   Acute deep vein thrombosis (DVT) of calf muscle vein of right lower extremity (HCC) 05/04/2021   Arthritis    Asthma    Basal cell carcinoma 01/17/2018   Left chest. Infiltrative. Excised 02/06/2018, margins free.   Basal cell carcinoma 12/14/2021   L nasal dorsum, Mohs [facility] 12/29/2021   Cystic teratoma of right ovary 11/23/2012   She underwent right salpingo-oophorectomy on April 1 by Dr. Tiburcio Pea for removal of what turned out to be a cystic teratoma. Hysteroscopy was also done at the time due to postmenopausal bleeding. It is unclear  why the left ovary was not taken out since she is postmenopausal.    Diastolic dysfunction    a. 09/2021 Echo: EF 55-60%, no rwma, GrII DD, nl RV fxn, mild MR, triv AI.   Dyspnea    GERD (gastroesophageal reflux disease)    Granuloma annulare    Hyperlipidemia    Hypertension    Menopause     Non-obstructive CAD (coronary artery disease)    a. 07/2021 Cor CTA: LM <25, LAD 25-49p, LCX <25p, RCA nl. Ca2+ = 390 (71st%'ile).   PAF (paroxysmal atrial fibrillation) (HCC)    a. 04/2021 - occured following R knee surgery.  CHA2DS2VASc = 5--> Eliquis.   Plantar fasciitis of right foot 12/24/2014   Right knee DJD 04/03/2012   SCC (squamous cell carcinoma) 10/08/2020   R mid lower pretibia, [facility]   SCC (squamous cell carcinoma) 03/16/2021   R upper pretibial, [facility] 12/14/2021   Squamous cell carcinoma of skin 02/26/2020   R lower pretibial, clear with bx 04/01/20   Squamous cell carcinoma of skin 07/15/2020   R mid pretibia, [facility]   Squamous cell carcinoma of skin 12/09/2020   R lateral pretibia, [facility]   Squamous cell carcinoma of skin 12/14/2021   R thenar hand dorsum, [facility].  Also Mohs 12/29/2021.   Past Surgical History:  Procedure Laterality Date   APPENDECTOMY     CATARACT EXTRACTION, BILATERAL     DILATION AND CURETTAGE OF UTERUS     RIGHT OOPHORECTOMY Right 04/08/2012   [name]   SPINE SURGERY  03/08/2008   TOTAL KNEE ARTHROPLASTY Right 04/29/2021   Procedure: TOTAL KNEE ARTHROPLASTY;  Surgeon: Lyndle Herrlich, MD;  Location: [facility] ORS;  Service: Orthopedics;  Laterality: Right;   VENOUS ABLATION     bilateral   Family History  Problem Relation Age of Onset   Early death Mother        durig childbirth   Heart disease Father    Heart attack Father    Breast cancer Sister 64       half sister   Heart disease Sister    Cancer Brother    Heart disease Brother        Afib   Social History   Socioeconomic History   Marital status: Widowed    Spouse name: Not on file   Number of children: Not on file   Years of education: Not on file   Highest education level: Not on file  Occupational History   Not on file  Tobacco Use   Smoking status: Never   Smokeless tobacco: Never  Vaping Use   Vaping Use: Never used  Substance and Sexual Activity   Alcohol use: No    Alcohol/week:  4.0 standard drinks of alcohol    Types: 4 Glasses of wine per week   Drug use: No   Sexual activity: Not Currently  Other Topics Concern   Not on file  Social History Narrative   Not on file   Social Determinants of Health   Financial Resource Strain: Low Risk  (08/27/2022)   Overall Financial Resource Strain (CARDIA)    Difficulty of Paying Living Expenses: Not hard at all  Food Insecurity: No Food Insecurity (08/27/2022)   Hunger Vital Sign    Worried About Running Out of Food in the Last Year: Never true    Ran Out of Food in the Last Year: Never true  Transportation Needs: No Transportation Needs (08/27/2022)   PRAPARE - Administrator, Civil Service (Medical): No    Lack of Transportation (Non-Medical): No  Physical Activity: Insufficiently Active (08/27/2022)   Exercise Vital Sign    Days of Exercise per Week: 3 days    Minutes of Exercise per Session: 30 min  Stress: No Stress Concern Present (08/27/2022)   [name] of Occupational Health - Occupational Stress Questionnaire    Feeling of Stress : Not at all  Social Connections: Moderately Integrated (08/27/2022)   Social Connection and Isolation Panel [NHANES]    Frequency of Communication with Friends and Family: More than three times a week    Frequency of Social Gatherings with Friends and Family: Three times a week    Attends Religious Services: More than 4 times per year    Active Member of Clubs or Organizations: Yes    Attends Banker Meetings: 1 to 4 times per year    Marital Status: Widowed    Tobacco Counseling Counseling given: Not Answered   Clinical Intake:  Pre-visit preparation completed: Yes  Pain : No/denies pain     Diabetes: No  How often do you need to have someone help you when you read instructions, pamphlets, or other written materials from your doctor or pharmacy?: 1 - Never  Interpreter Needed?: No  Information entered by :: Kandis Fantasia  LPN   Activities of Daily Living    08/27/2022    3:45 PM  In your present state of health, do you have any difficulty performing the following activities:  Hearing? 0  Vision? 0  Difficulty concentrating or making decisions? 0  Walking or climbing stairs? 0  Dressing or bathing? 0  Doing errands, shopping? 0  Preparing Food and eating ? N  Using the Toilet? N  In the past six months, have you accidently leaked urine? N  Do you have problems with loss of bowel control? N  Managing your Medications? N  Managing your Finances? N  Housekeeping or managing your Housekeeping? N    Patient Care Team: Sherlene Shams, MD as PCP - General (Internal Medicine) Antonieta Iba, MD as PCP - Cardiology (Cardiology)  Indicate any recent Medical Services you may have received from other than [facility] providers in the past year (date may be approximate).     Assessment:   This is a routine wellness examination for [place].  Hearing/Vision screen Hearing Screening - Comments:: Denies hearing difficulties    Dietary issues and exercise activities discussed:     Goals Addressed   None   Depression Screen    08/27/2022    3:37 PM 07/19/2022    3:45 PM 05/18/2022   10:31 AM 05/18/2022   10:30 AM 04/09/2022    1:12 PM 01/18/2022    2:55 PM 08/24/2021    3:46 PM  PHQ 2/9 Scores  PHQ - 2 Score 0 0 0 0 0 0 0  PHQ- 9 Score   0   0 0    Fall Risk    08/27/2022    3:44 PM 07/19/2022    3:45 PM 05/18/2022   10:30 AM 04/09/2022    1:11 PM 01/18/2022    2:55 PM  Fall Risk   Falls in the past year? 0 0 0 0 0  Number falls in past yr: 0 0 0 0   Injury with Fall? 0 0 0 0   Risk for fall due to : No Fall Risks No Fall Risks No Fall Risks No Fall Risks No Fall Risks  Follow up Falls prevention discussed;Education provided;Falls evaluation completed Falls evaluation completed Falls evaluation completed Falls evaluation completed Falls evaluation completed    MEDICARE RISK AT HOME:  Medicare Risk  at Home - 08/27/22 1544     Any stairs in or around the home? No    If so, are there any without handrails? No    Home free of loose throw rugs in walkways, pet beds, electrical cords, etc? Yes    Adequate lighting in your home to reduce risk of falls? Yes    Life alert? No    Use of a cane, walker or w/c? No    Grab bars in the bathroom? Yes    Shower chair or bench in shower? No    Elevated toilet seat or a handicapped toilet? Yes             TIMED UP AND GO:  Was the test performed?  No    Cognitive Function:    04/20/2016    4:02 PM  MMSE - Mini Mental State Exam  Orientation to time 5  Orientation to Place 5  Registration 3  Attention/ Calculation 5  Recall 3  Language- name 2 objects 2  Language- repeat 1  Language- follow 3 step  command 3  Language- read & follow direction 1  Write a sentence 1  Copy design 1  Total score 30        08/27/2022    3:45 PM 08/22/2019    1:58 PM 08/17/2018   12:41 PM 05/31/2017    4:53 PM  6CIT Screen  What Year? 0 points 0 points 0 points 0 points  What month? 0 points 0 points 0 points 0 points  What time? 0 points 0 points 0 points 0 points  Count back from 20 0 points  0 points 0 points  Months in reverse 0 points 0 points 0 points 2 points  Repeat phrase 2 points 0 points  0 points  Total Score 2 points   2 points    Immunizations Immunization History  Administered Date(s) Administered   Influenza Split 11/25/2011, 12/23/2021   Influenza, High Dose Seasonal PF 01/30/2018   Influenza,inj,Quad PF,6+ Mos 11/22/2012   Influenza,inj,quad, With Preservative 12/07/2018   Influenza-Unspecified 12/20/2013, 01/07/2015, 01/09/2016, 12/15/2016, 12/06/2019, 01/15/2021   PFIZER(Purple Top)SARS-COV-2 Vaccination 04/28/2019, 05/23/2019, 01/01/2020, 07/31/2020   PPD Test 05/15/2021   Pneumococcal Conjugate,unspecified 11/07/2013   Pneumococcal Conjugate-13 01/03/2014   Pneumococcal Polysaccharide-23 11/15/2011, 11/25/2011,  02/09/2018   Tdap 12/06/2013   Zoster, Live 12/29/2009    TDAP status: Up to date  Pneumococcal vaccine status: Up to date  Covid-19 vaccine status: Information provided on how to obtain vaccines.   Qualifies for Shingles Vaccine? Yes   Zostavax completed Yes   Shingrix Completed?: No.    Education has been provided regarding the importance of this vaccine. Patient has been advised to call insurance company to determine out of pocket expense if they have not yet received this vaccine. Advised may also receive vaccine at local pharmacy or Health Dept. Verbalized acceptance and understanding.  Screening Tests Health Maintenance  Topic Date Due   COVID-19 Vaccine (5 - 2023-24 season) 11/06/2021   Zoster Vaccines- Shingrix (1 of 2) 11/27/2022 (Originally 10/22/1954)   INFLUENZA VACCINE  10/07/2022   Medicare Annual Wellness (AWV)  08/27/2023   DTaP/Tdap/Td (2 - Td or Tdap) 12/07/2023   Pneumonia Vaccine 42+ Years old  Completed   DEXA SCAN  Completed   HPV VACCINES  Aged Out    Health Maintenance  Health Maintenance Due  Topic Date Due   COVID-19 Vaccine (5 - 2023-24 season) 11/06/2021    Colorectal cancer screening: No longer required.   Mammogram status: Completed 02/11/22. Repeat every year  Bone Density status: Completed 02/18/15. Results reflect: Bone density results: NORMAL. Repeat every 5 years.  Lung Cancer Screening: (Low Dose CT Chest recommended if Age 49-80 years, 20 pack-year currently smoking OR have quit w/in 15years.) does not qualify.   Lung Cancer Screening Referral: n/a  Additional Screening:  Hepatitis C Screening: does not qualify  Vision Screening: Recommended annual ophthalmology exams for early detection of glaucoma and other disorders of the eye. Is the patient up to date with their annual eye exam?  Yes  Who is the provider or what is the name of the office in which the patient attends annual eye exams? [facility]  If pt is not established  with a provider, would they like to be referred to a provider to establish care? No .   Dental Screening: Recommended annual dental exams for proper oral hygiene  Community Resource Referral / Chronic Care Management: CRR required this visit?  No   CCM required this visit?  No     Plan:  I have personally reviewed and noted the following in the patient's chart:   Medical and social history Use of alcohol, tobacco or illicit drugs  Current medications and supplements including opioid prescriptions. Patient is not currently taking opioid prescriptions. Functional ability and status Nutritional status Physical activity Advanced directives List of other physicians Hospitalizations, surgeries, and ER visits in previous 12 months Vitals Screenings to include cognitive, depression, and falls Referrals and appointments  In addition, I have reviewed and discussed with patient certain preventive protocols, quality metrics, and best practice recommendations. A written personalized care plan for preventive services as well as general preventive health recommendations were provided to patient.     Kandis Fantasia [place], [place]   1/61/0960   After Visit Summary: (MyChart) Due to this being a telephonic visit, the after visit summary with patients personalized plan was offered to patient via MyChart   Nurse Notes: No concerns     I have reviewed the above information and agree with above.   Duncan Dull, MD

## 2022-09-08 ENCOUNTER — Ambulatory Visit (INDEPENDENT_AMBULATORY_CARE_PROVIDER_SITE_OTHER): Payer: Medicare Other

## 2022-09-08 DIAGNOSIS — L209 Atopic dermatitis, unspecified: Secondary | ICD-10-CM

## 2022-09-08 MED ORDER — DUPILUMAB 300 MG/2ML ~~LOC~~ SOSY
300.0000 mg | PREFILLED_SYRINGE | Freq: Once | SUBCUTANEOUS | Status: AC
Start: 2022-09-08 — End: 2022-09-08
  Administered 2022-09-08: 300 mg via SUBCUTANEOUS

## 2022-09-08 NOTE — Progress Notes (Signed)
Patient here today for Dupixent injection for chronic and persistent prurigo nodules.    Dupixent 300mg /34mL injected into right arm. Patient tolerated okay. Patient stated that she has noticed no improvement while taking Dupixent and requested a sooner appointment to see Dr. Roseanne Reno to discuss other options.   LOT: [id number] EXP: 06/2024 NDC: 0454-0981-19   Evorn Gong, RMA

## 2022-09-14 ENCOUNTER — Ambulatory Visit: Payer: Medicare Other | Admitting: Dermatology

## 2022-09-14 DIAGNOSIS — Z79899 Other long term (current) drug therapy: Secondary | ICD-10-CM | POA: Diagnosis not present

## 2022-09-14 DIAGNOSIS — L820 Inflamed seborrheic keratosis: Secondary | ICD-10-CM | POA: Diagnosis not present

## 2022-09-14 DIAGNOSIS — L821 Other seborrheic keratosis: Secondary | ICD-10-CM | POA: Diagnosis not present

## 2022-09-14 DIAGNOSIS — L2081 Atopic neurodermatitis: Secondary | ICD-10-CM | POA: Diagnosis not present

## 2022-09-14 DIAGNOSIS — L281 Prurigo nodularis: Secondary | ICD-10-CM

## 2022-09-14 NOTE — Patient Instructions (Addendum)
Cryotherapy Aftercare  Wash gently with soap and water everyday.   Apply Vaseline and Band-Aid daily until healed.   Continue CeraVe/clobetasol mix twice daily after pouring the remaining clobetasol solution into CeraVe.   Samples of Cibinqo 100 mg given to patient to take one daily until follow up.  Due to recent changes in healthcare laws, you may see results of your pathology and/or laboratory studies on MyChart before the doctors have had a chance to review them. We understand that in some cases there may be results that are confusing or concerning to you. Please understand that not all results are received at the same time and often the doctors may need to interpret multiple results in order to provide you with the best plan of care or course of treatment. Therefore, we ask that you please give [place] 2 business days to thoroughly review all your results before contacting the office for clarification. Should we see a critical lab result, you will be contacted sooner.   If You Need Anything After Your Visit  If you have any questions or concerns for your doctor, please call our main line at [phone] and press option 4 to reach your doctor's medical assistant. If no one answers, please leave a voicemail as directed and we will return your call as soon as possible. Messages left after 4 pm will be answered the following business day.   You may also send [place] a message via MyChart. We typically respond to MyChart messages within 1-2 business days.  For prescription refills, please ask your pharmacy to contact our office. Our fax number is [phone].  If you have an urgent issue when the clinic is closed that cannot wait until the next business day, you can page your doctor at the number below.    Please note that while we do our best to be available for urgent issues outside of office hours, we are not available 24/7.   If you have an urgent issue and are unable to reach [place], you may choose  to seek medical care at your doctor's office, retail clinic, urgent care center, or emergency room.  If you have a medical emergency, please immediately call 911 or go to the emergency department.  Pager Numbers  - Dr. Gwen Pounds: [phone]  - Dr. Neale Burly: [phone]  - Dr. Roseanne Reno: [phone]  In the event of inclement weather, please call our main line at [phone] for an update on the status of any delays or closures.  Dermatology Medication Tips: Please keep the boxes that topical medications come in in order to help keep track of the instructions about where and how to use these. Pharmacies typically print the medication instructions only on the boxes and not directly on the medication tubes.   If your medication is too expensive, please contact our office at [phone] option 4 or send [place] a message through MyChart.   We are unable to tell what your co-pay for medications will be in advance as this is different depending on your insurance coverage. However, we may be able to find a substitute medication at lower cost or fill out paperwork to get insurance to cover a needed medication.   If a prior authorization is required to get your medication covered by your insurance company, please allow [place] 1-2 business days to complete this process.  Drug prices often vary depending on where the prescription is filled and some pharmacies may offer cheaper prices.  The website www.goodrx.com contains coupons for medications through  different pharmacies. The prices here do not account for what the cost may be with help from insurance (it may be cheaper with your insurance), but the website can give you the price if you did not use any insurance.  - You can print the associated coupon and take it with your prescription to the pharmacy.  - You may also stop by our office during regular business hours and pick up a GoodRx coupon card.  - If you need your prescription sent electronically to a  different pharmacy, notify our office through [facility] or by phone at [phone] option 4.

## 2022-09-14 NOTE — Progress Notes (Signed)
Follow-Up Visit   Subjective  Jamie Anderson is a 87 y.o. female who presents for the following: patient still breaking out. Patient currently on Dupixent injections (started 07/06/23) and is using CeraVe/clobetasol mix but she only poured half a bottle of clobetasol solution into it. She thought she was suppose to pour half into each tub of cream. Patient not using tacrolimus. Pt has h/o generalized GA in past.  Patient broken out on arms and legs, is still itching on Dupixent.  Not improving after almost 3 months injections.   The following portions of the chart were reviewed this encounter and updated as appropriate: medications, allergies, medical history  Review of Systems:  No other skin or systemic complaints except as noted in HPI or Assessment and Plan.  Objective  Well appearing patient in no apparent distress; mood and affect are within normal limits.   A focused examination was performed of the following areas: Arms, legs  Relevant exam findings are noted in the Assessment and Plan.  left thigh x 2 (2) Erythematous stuck-on, waxy papule    Assessment & Plan   ATOPIC NEURODERMATITIS with prurigo nodularis  Exam: numerous pink excoriated papules at arms, legs, and posterior neck 20% BSA   Chronic and persistent condition with duration or expected duration over one year. Condition is symptomatic/ bothersome to patient. Not currently at goal. Not improving on Dupixent with continued itching.  Atopic dermatitis - Severe, on Dupixent (biologic medication).  Atopic dermatitis (eczema) is a chronic, relapsing, pruritic condition that can significantly affect quality of life. It is often associated with allergic rhinitis and/or asthma and can require treatment with topical medications, phototherapy, or in severe cases biologic medications, which require long term medication management.     Treatment Plan: Will discontinue Dupixent and plan to start samples of Adbry  injections next week at loading dose. Continue CeraVe/clobetasol mix twice daily after pouring the remaining clobetasol solution into CeraVe cream tub.  Sample of Cibinqo 100 mg given to patient to take one daily to help with itching. #14 Lot # [id number]  Exp: 02/2024 If patient not improving at f/up in 4-6 weeks, consider skin biopsy  Patient has tried and failed Dupixent, clobetasol cream, Eucrisa ointment and fluocinonide cream, tacrolimus ointment. Waiting on appeal for Adbry.  Also consider Cibinqo or Rinvoq if not able to get Adbry approved or if Adbry not effective.  Recommend gentle skin care.  Long term medication management.  Patient is using long term (months to years) prescription medication  to control their dermatologic condition.  These medications require periodic monitoring to evaluate for efficacy and side effects and may require periodic laboratory monitoring.   SEBORRHEIC KERATOSIS - Stuck-on, waxy, tan-brown papules and/or plaques  - Benign-appearing - Discussed benign etiology and prognosis. - Observe - Call for any changes  Inflamed seborrheic keratosis (2) left thigh x 2  Vs Prurigo Nodules  Symptomatic, irritating, patient would like treated.  Benign-appearing.  Call clinic for new or changing lesions.    Destruction of lesion - left thigh x 2  Destruction method: cryotherapy   Informed consent: discussed and consent obtained   Lesion destroyed using liquid nitrogen: Yes   Region frozen until ice ball extended beyond lesion: Yes   Outcome: patient tolerated procedure well with no complications   Post-procedure details: wound care instructions given   Additional details:  Prior to procedure, discussed risks of blister formation, small wound, skin dyspigmentation, or rare scar following cryotherapy. Recommend Vaseline ointment to treated  areas while healing.     Return for 4 - 6 weeks with Dr. Roseanne Reno, 1 week with nurse to start Adbry.  Anise Salvo, RMA, am acting as scribe for Willeen Niece, MD .   Documentation: I have reviewed the above documentation for accuracy and completeness, and I agree with the above.  Willeen Niece, MD

## 2022-09-21 ENCOUNTER — Ambulatory Visit (INDEPENDENT_AMBULATORY_CARE_PROVIDER_SITE_OTHER): Payer: Medicare Other

## 2022-09-21 ENCOUNTER — Ambulatory Visit: Payer: Medicare Other

## 2022-09-21 DIAGNOSIS — L2081 Atopic neurodermatitis: Secondary | ICD-10-CM | POA: Diagnosis not present

## 2022-09-21 MED ORDER — TRALOKINUMAB-LDRM 150 MG/ML ~~LOC~~ SOSY
600.0000 mg | PREFILLED_SYRINGE | Freq: Once | SUBCUTANEOUS | Status: AC
Start: 2022-09-21 — End: 2022-09-21
  Administered 2022-09-21: 600 mg via SUBCUTANEOUS

## 2022-09-21 NOTE — Progress Notes (Signed)
Patient here today to start Adbry injections for chronic and persistent atopic dermatitis.   Patient is not showing any improvement with the Cinbinqo sample provided. Per Dr. Roseanne Reno start with a full loading dose today.  Adbry 600mg  (150mg /mL X4) injected into the left upper arm. Patient tolerated injections okay.  LOT: [id number] EXP: 07/2023  Dorathy Daft, RMA

## 2022-09-27 DIAGNOSIS — I10 Essential (primary) hypertension: Secondary | ICD-10-CM | POA: Diagnosis not present

## 2022-09-27 DIAGNOSIS — N1831 Chronic kidney disease, stage 3a: Secondary | ICD-10-CM | POA: Diagnosis not present

## 2022-09-27 DIAGNOSIS — N2581 Secondary hyperparathyroidism of renal origin: Secondary | ICD-10-CM | POA: Diagnosis not present

## 2022-09-27 DIAGNOSIS — D631 Anemia in chronic kidney disease: Secondary | ICD-10-CM | POA: Diagnosis not present

## 2022-09-27 LAB — PROTEIN / CREATININE RATIO, URINE: Creatinine, Urine: 163

## 2022-09-27 LAB — MICROALBUMIN, URINE: Microalb, Ur: 1.5

## 2022-09-27 LAB — MICROALBUMIN / CREATININE URINE RATIO: Microalb Creat Ratio: 9

## 2022-09-29 DIAGNOSIS — M1712 Unilateral primary osteoarthritis, left knee: Secondary | ICD-10-CM | POA: Diagnosis not present

## 2022-10-05 ENCOUNTER — Ambulatory Visit (INDEPENDENT_AMBULATORY_CARE_PROVIDER_SITE_OTHER): Payer: Medicare Other

## 2022-10-05 DIAGNOSIS — L2081 Atopic neurodermatitis: Secondary | ICD-10-CM

## 2022-10-05 DIAGNOSIS — L209 Atopic dermatitis, unspecified: Secondary | ICD-10-CM | POA: Diagnosis not present

## 2022-10-05 MED ORDER — TRALOKINUMAB-LDRM 150 MG/ML ~~LOC~~ SOSY
300.0000 mg | PREFILLED_SYRINGE | Freq: Once | SUBCUTANEOUS | Status: AC
Start: 2022-10-05 — End: 2022-10-05
  Administered 2022-10-05: 300 mg via SUBCUTANEOUS

## 2022-10-05 NOTE — Progress Notes (Signed)
Patient here today for Adbry injections for chronic and persistent atopic dermatitis.    Adbry 150mg /mL X2 syringes injected into right arm. Patient tolerated injections well.   LOT: [id number] EXP: 07/2023   Dorathy Daft, RMA

## 2022-10-08 IMAGING — DX DG CHEST 2V
2 series · 2 of 2 positions shown · non-contrast
Comparison: 08/18/2020

CLINICAL DATA: Chronic bronchiectasis

EXAM:
CHEST - 2 VIEW

[chest pa]
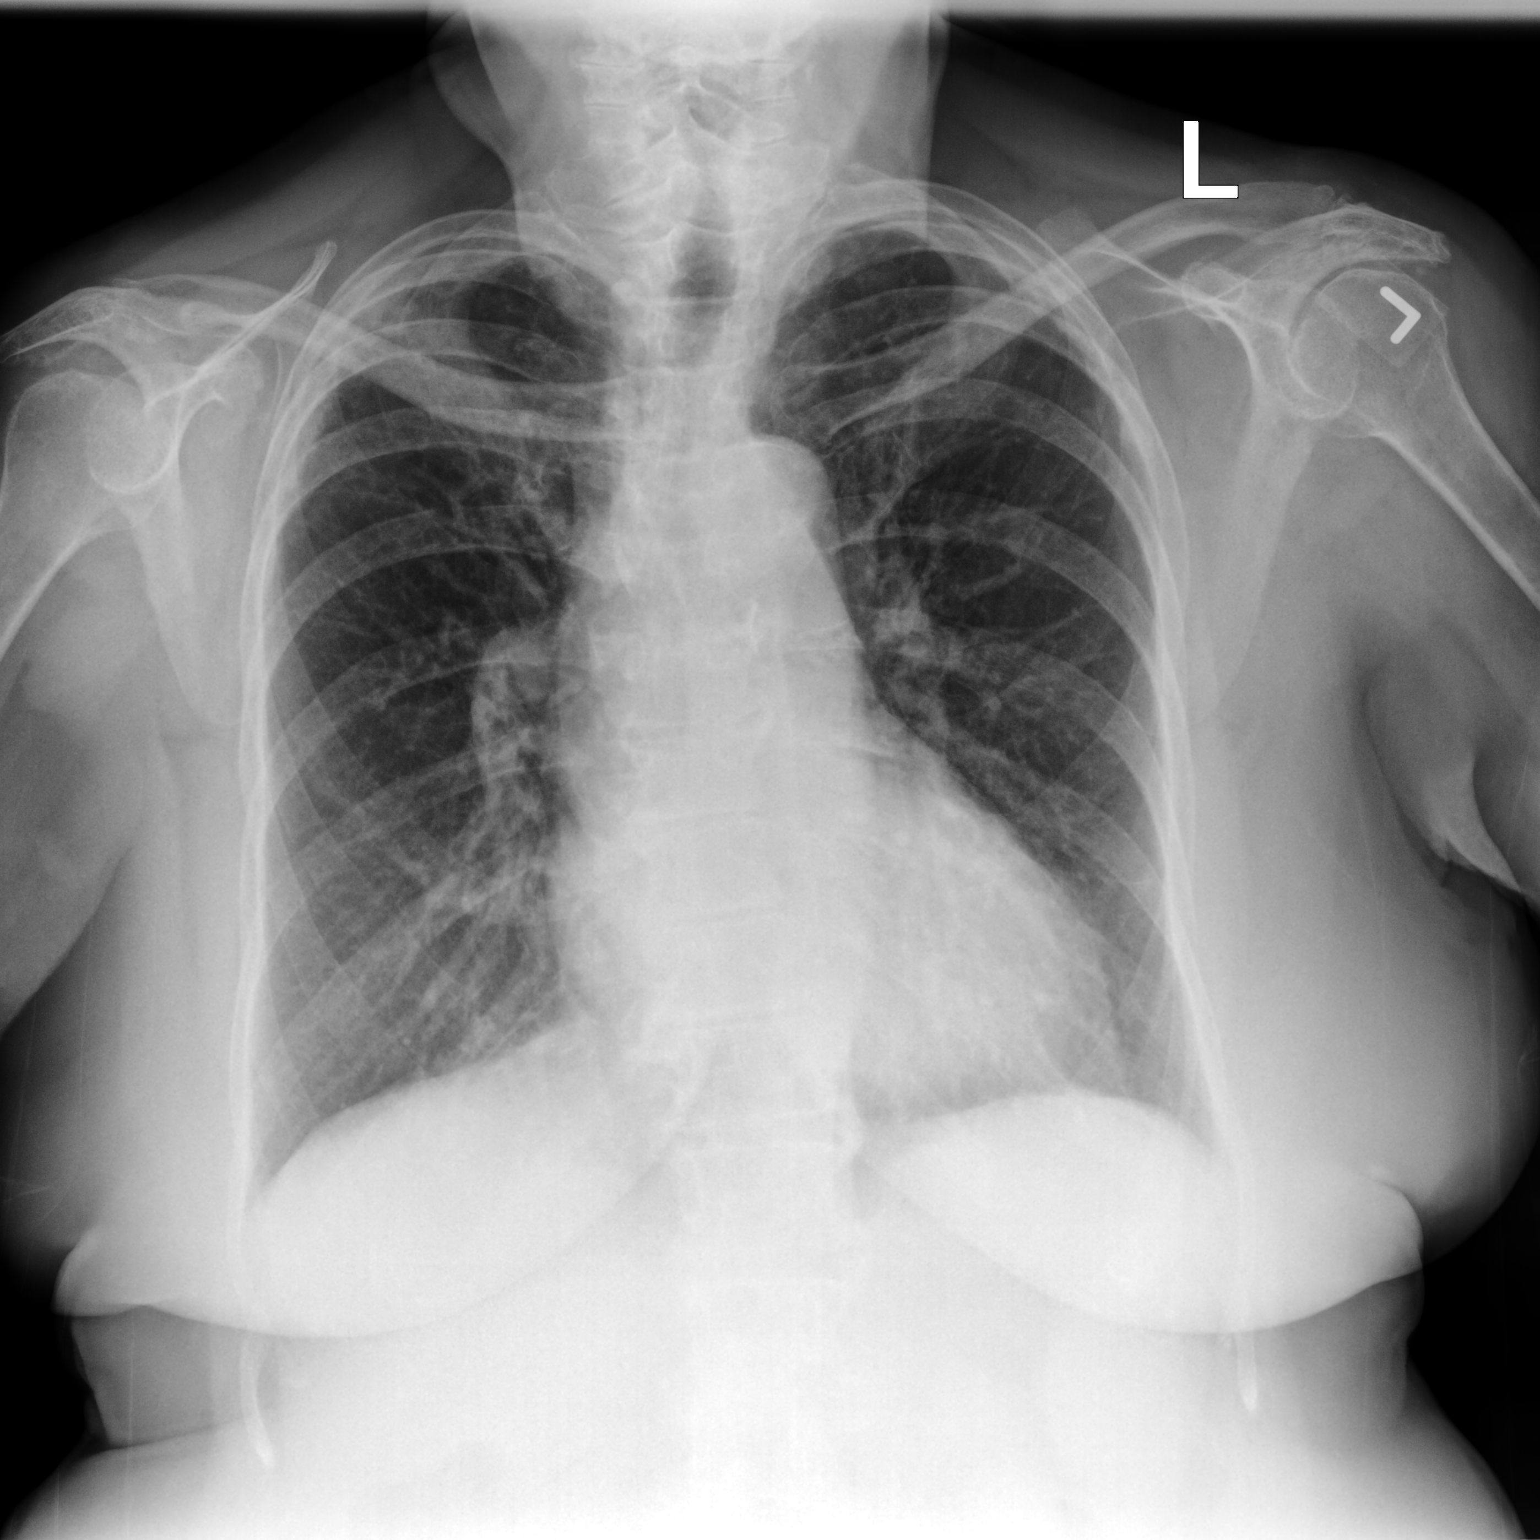

[chest lat]
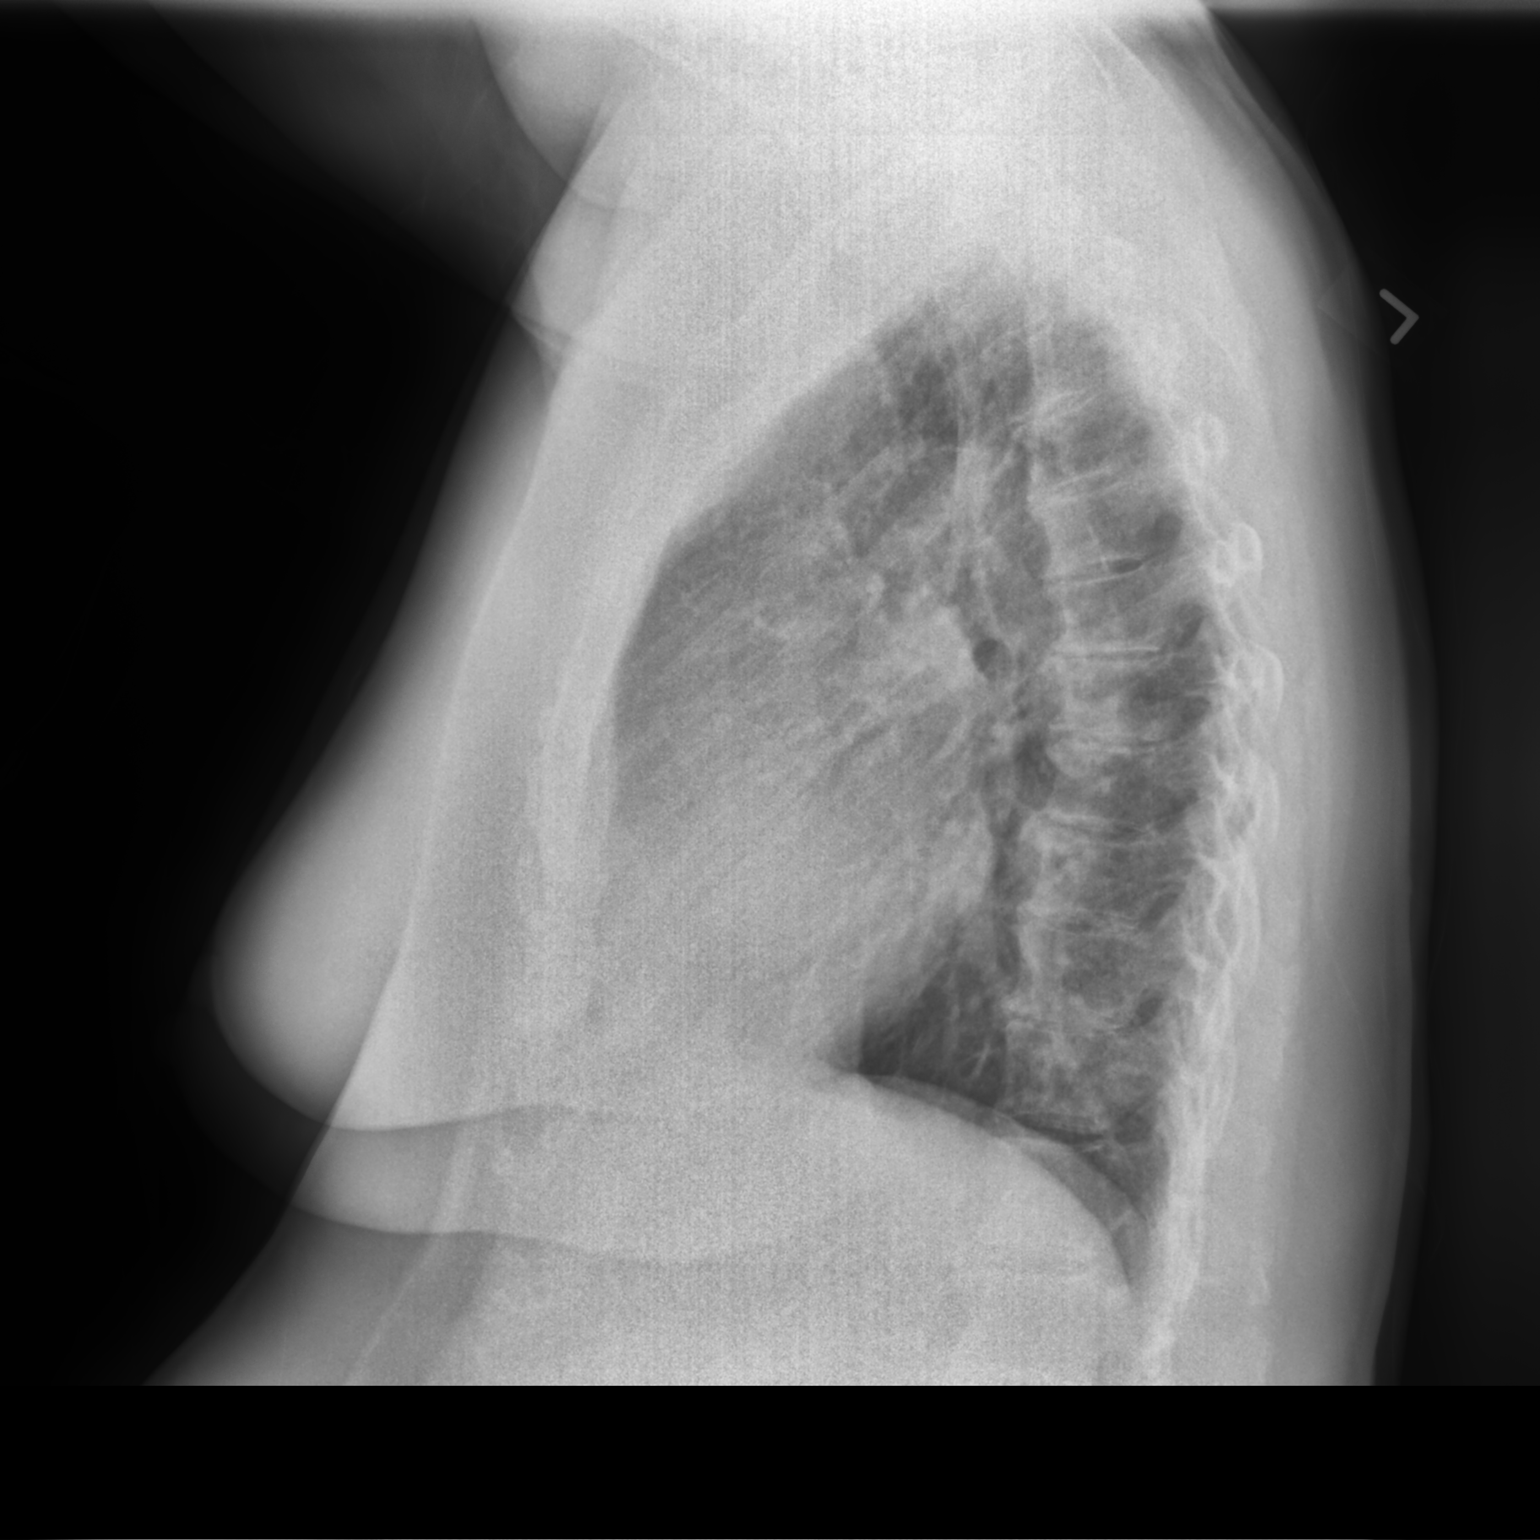

[2 of 2 positions shown; findings below may reference images not displayed]

FINDINGS: Cardiac shadow is within normal limits. Lungs demonstrate apical
pleuroparenchymal scarring bilaterally. No focal infiltrate or
effusion is seen. No bony abnormality is noted.
IMPRESSION: Chronic changes without acute abnormality.

## 2022-10-11 DIAGNOSIS — M7061 Trochanteric bursitis, right hip: Secondary | ICD-10-CM | POA: Diagnosis not present

## 2022-10-15 ENCOUNTER — Ambulatory Visit (INDEPENDENT_AMBULATORY_CARE_PROVIDER_SITE_OTHER): Payer: Medicare Other | Admitting: Family

## 2022-10-15 ENCOUNTER — Encounter: Payer: Self-pay | Admitting: Family

## 2022-10-15 VITALS — BP 132/82 | HR 94 | Temp 97.9°F | Ht 64.0 in | Wt 189.1 lb

## 2022-10-15 DIAGNOSIS — M1000 Idiopathic gout, unspecified site: Secondary | ICD-10-CM

## 2022-10-15 DIAGNOSIS — B029 Zoster without complications: Secondary | ICD-10-CM

## 2022-10-15 MED ORDER — VALACYCLOVIR HCL 1 G PO TABS
1000.0000 mg | ORAL_TABLET | Freq: Three times a day (TID) | ORAL | 0 refills | Status: DC
Start: 2022-10-15 — End: 2022-11-30

## 2022-10-15 MED ORDER — TRAMADOL HCL 50 MG PO TABS
50.0000 mg | ORAL_TABLET | Freq: Three times a day (TID) | ORAL | 0 refills | Status: AC | PRN
Start: 2022-10-15 — End: 2022-10-20

## 2022-10-15 NOTE — Assessment & Plan Note (Addendum)
Symptoms consistent with uncomplicated herpes zoster.  Provided tramadol for pain relief.  Advised not to take tramadol with tizanidine due to drug interaction.  counseled on side effects including sedation of this medication.  Start valcyclovir.  Advised to keep dermatology appointment for Monday.

## 2022-10-15 NOTE — Patient Instructions (Addendum)
I have given you tramadol which is an opioid like medication to use for pain.    Do not take tramadol with tizanidine (which is a muscle relaxant) prescribed by [facility] due to a strong drug interaction  Do not take alcohol with tramadol as this medication can be sedating  Please take tramadol as needed and store safely  Please start valacyclovir which is antiviral used to treat shingles.    If pain persists, please let us know if you previously had any side effects from gabapentin as this medication is used to treat neuropathic pain  Please discuss allopurinol with dermatology as it relates to your rash and if they advise to resume.  You may schedule appointment Dr Darrick Huntsman to discuss allopurinol  Please keep your appointment with dermatology next week and call me with any concerns  Shingles  Shingles, or herpes zoster, is an infection. It gives you a skin rash and blisters. These infected areas may hurt a lot. Shingles only happens if: You've had chickenpox. You've been given a shot called a vaccine to protect you from getting chickenpox. Shingles is rare in this case. What are the causes? Shingles is caused by a germ called the varicella-zoster virus. This is the same germ that causes chickenpox. After you're exposed to the germ, it stays in your body but is dormant. This means it isn't active. Shingles happens if the germ becomes active again. This can happen years after you're first exposed to the germ. What increases the risk? You may be more likely to get shingles if: You're older than 85 years of age. You're under a lot of stress. You have a weak immune system. The immune system is your body's defense system. It may be weak if: You have human immunodeficiency virus (HIV). You have acquired immunodeficiency syndrome (AIDS). You have cancer. You take medicines that weaken your immune system. These include organ transplant medicines. What are the signs or symptoms? The first  symptoms of shingles may be itching, tingling, or pain. Your skin may feel like it's burning. A few days or weeks later, you'll get a rash. Here's what you can expect: The rash is likely to be on one side of your body. The rash may be shaped like a belt or a band. Over time, it will turn into blisters filled with fluid. The blisters will break open and change into scabs. The scabs will dry up in about 2-3 weeks. You may also have: A fever. Chills. A headache. Nausea. How is this diagnosed? Shingles is diagnosed with a skin exam. A sample called a culture may be taken from one of your blisters and sent to a lab. This will show if you have shingles. How is this treated? The rash may last for several weeks. There's no cure for shingles, but your health care provider may give you medicines. These medicines may: Help with pain. Help with itching. Help with irritation and swelling. Help you get better sooner. Help to prevent long-term problems. If the rash is on your face, you may need to see an eye doctor or an ear, nose, and throat (ENT) doctor. Follow these instructions at home: Medicines Take your medicines only as told by your provider. Put an anti-itch cream or numbing cream on the rash or blisters as told by your provider. Relieving itching and discomfort  To help with itching: Put cold, wet cloths called cold compresses on the rash or blisters. Take a cool bath. Try adding baking soda or dry oatmeal  to the water. Do not bathe in hot water. Use calamine lotion on the rash or blisters. You can get this type of lotion at the store. Blister and rash care Keep your rash covered with a loose bandage. Wear loose clothes that don't rub on your rash. Take care of your rash as told by your provider. Make sure you: Wash your hands with soap and water for at least 20 seconds before and after you change your bandage. If you can't use soap and water, use hand sanitizer. Keep your rash and  blisters clean by washing them with mild soap and cool water. Change your bandage. Check your rash every day for signs of infection. Check for: More redness, swelling, or pain. Fluid or blood. Warmth. Pus or a bad smell. Do not scratch your rash. Do not pick at your blisters. To help you not scratch: Keep your fingernails clean and cut short. Try to wear gloves or mittens when you sleep. General instructions Rest. Wash your hands often with soap and water for at least 20 seconds. If you can't use soap and water, use hand sanitizer. Washing your hands lowers your chance of getting a skin infection. Your infection can cause chickenpox in others. If you have blisters that aren't scabs yet, stay away from: Babies. Pregnant people. Children who have eczema. Older people who have organ transplants. People who have a long-term, or chronic, illness. Anyone who hasn't had chickenpox before. Anyone who hasn't gotten the chickenpox vaccine. How is this prevented? Vaccines are the best way to prevent you from getting chickenpox or shingles. Talk with your provider about getting these shots. Where to find more information Centers for Disease Control and Prevention (CDC): TonerPromos.no Contact a health care provider if: Your pain doesn't get better with medicine. Your pain doesn't get better after the rash heals. You have any signs of infection around the rash. Your rash or blisters get worse. You have a fever or chills. Get help right away if: The rash is on your face or nose. You have pain in your face or by your eye. You lose feeling on one side of your face. You have trouble seeing. You have ear pain or ringing in your ear. This information is not intended to replace advice given to you by your health care provider. Make sure you discuss any questions you have with your health care provider. Document Revised: 04/09/2022 Document Reviewed: 04/09/2022 Elsevier Patient Education  2024 [employer].

## 2022-10-15 NOTE — Progress Notes (Signed)
Assessment & Plan:  Herpes zoster without complication Assessment & Plan: Symptoms consistent with uncomplicated herpes zoster.  Provided tramadol for pain relief.  Advised not to take tramadol with tizanidine due to drug interaction.  counseled on side effects including sedation of this medication.  Start valcyclovir.  Advised to keep dermatology appointment for Monday.   Orders: -     valACYclovir HCl; Take 1 tablet (1,000 mg total) by mouth 3 (three) times daily.  Dispense: 21 tablet; Refill: 0 -     traMADol HCl; Take 1 tablet (50 mg total) by mouth every 8 (eight) hours as needed for up to 5 days.  Dispense: 15 tablet; Refill: 0  Idiopathic gout, unspecified chronicity, unspecified site Assessment & Plan: Allopurinol suspended by nephrology in the setting of rash.  Advised patient to discuss allopurinol with dermatology next week.  Advised patient to follow-up with Dr Darrick Huntsman in regards to resuming allopurinol.      Return precautions given.   Risks, benefits, and alternatives of the medications and treatment plan prescribed today were discussed, and patient expressed understanding.   Education regarding symptom management and diagnosis given to patient on AVS either electronically or printed.  No follow-ups on file.  Rennie Plowman, FNP  Subjective:    Patient ID: Jamie Anderson, female    DOB: Jul 18, 1935, 87 y.o.   MRN: [id number]  CC: Jamie Anderson is a 87 y.o. female who presents today for an acute visit.    HPI: Complains of intensely painful rash right lateral hip and upper leg pain, describes as burning.   She has been unable to sleep due to pain.   Previously she had a bilateral arm and leg rash thought she was allergic allopurinol so she stopped  3 weeks allopurinol with no improvement of drug rash on upper arms. She is following with [facility],follow up scheduled 10/19/22.        She was seen 10/11/22 at [facility] for right hip pain, seen by Gerilyn Pilgrim  Darr PA and given a steroid taper, tizanidine without relief.     MR lumbar 06/26/18- progressive advanced multilevel lumbar disc and facet degeneration.  Moderate to severe spinal stenosis L2-L3.  Interval laminectomy L3-L4 and L4-L5.  New moderate to severe left neural foraminal stenosis L3-L4.   Crt 1.04, follows with Dr. Cherylann Ratel, last seen 09/27/2022; Visit notes chronic kidney disease,, diffuse rash.  Stated could represent a drug reaction, allopurinol may be a causative agent.  Advised to discuss with Dermatologist  H/o zoster vaccine 2011  No h/o seizure Allergies: Almond mea, Amoxicillin-pot clavulanate, Calcitriol, Clarithromycin, Colloidal oatmeal, Dust mite extract, Gluten meal, Molds & smuts, Oat, Other, and Shrimp [shellfish allergy] Current Outpatient Medications on File Prior to Visit  Medication Sig Dispense Refill   acetaminophen (TYLENOL) 325 MG tablet Take 1-2 tablets (325-650 mg total) by mouth every 6 (six) hours as needed for mild pain (pain score 1-3 or temp > 100.5). 40 tablet 1   albuterol (PROAIR HFA) 108 (90 Base) MCG/ACT inhaler Inhale 1-2 puffs into the lungs every 6 (six) hours as needed for wheezing or shortness of breath. 3 each 0   allopurinol (ZYLOPRIM) 100 MG tablet TAKE 1 TABLET BY MOUTH  DAILY 90 tablet 3   amLODipine (NORVASC) 2.5 MG tablet Take 1 tablet (2.5 mg total) by mouth daily. 30 tablet 2   apixaban (ELIQUIS) 5 MG TABS tablet Take 1 tablet (5 mg total) by mouth 2 (two) times daily. 60 tablet 5  calcitRIOL (ROCALTROL) 0.25 MCG capsule Take 0.25 mcg by mouth daily.     calcium carbonate (TUMS - DOSED IN MG ELEMENTAL CALCIUM) 500 MG chewable tablet Chew 1 tablet by mouth daily as needed for indigestion or heartburn.     cetirizine (ZYRTEC) 10 MG tablet Take 10 mg by mouth daily as needed for allergies.     clobetasol (TEMOVATE) 0.05 % external solution Mix into CeraVe cream as instructed (pt has written instructions) apply to rash BID PRN. Avoid  applying to face, groin, and axilla. Use as directed. Long-term use can cause thinning of the skin. 50 mL 1   conjugated estrogens (PREMARIN) vaginal cream Place 1 applicator vaginally as needed.     cyanocobalamin (,VITAMIN B-12,) 1000 MCG/ML injection Inject 1 mL (1,000 mcg total) into the muscle every 14 (fourteen) days. 1 mL 1   Dupilumab (DUPIXENT) 300 MG/2ML SOPN Inject 300 mg into the skin every 14 (fourteen) days. Starting at day 15 for maintenance. 4 mL 5   fluocinonide cream (LIDEX) 0.05 % Apply 1 application. topically 2 (two) times daily as needed (itching).     fluticasone (FLONASE) 50 MCG/ACT nasal spray Place 2 sprays into both nostrils daily as needed for allergies or rhinitis.     fluticasone-salmeterol (ADVAIR) 100-50 MCG/ACT AEPB Inhale 1 puff into the lungs 2 (two) times daily as needed. 180 each 1   hydrochlorothiazide (HYDRODIURIL) 25 MG tablet Take 1 tablet (25 mg total) by mouth daily. 90 tablet 3   losartan (COZAAR) 100 MG tablet TAKE 1 TABLET BY MOUTH DAILY 90 tablet 1   Meclizine HCl 25 MG CHEW Chew 1 tablet by mouth 2 (two) times daily.     methylPREDNISolone (MEDROL DOSEPAK) 4 MG TBPK tablet Take by mouth.     metoprolol succinate (TOPROL-XL) 25 MG 24 hr tablet TAKE 1 TABLET BY MOUTH AT  BEDTIME 90 tablet 3   pantoprazole (PROTONIX) 40 MG tablet TAKE 1 TABLET BY MOUTH DAILY 90 tablet 3   rosuvastatin (CRESTOR) 20 MG tablet Take 1 tablet (20 mg total) by mouth daily. 90 tablet 0   sertraline (ZOLOFT) 100 MG tablet TAKE 1 TABLET BY MOUTH DAILY 90 tablet 3   tacrolimus (PROTOPIC) 0.1 % ointment Apply to arms/legs at night for itching. 100 g 1   tiZANidine (ZANAFLEX) 2 MG tablet Take 1 tablet as needed by oral route at bedtime.     Tralokinumab-ldrm (ADBRY) 150 MG/ML SOSY Inject 2 mLs (300 mg total) into the skin every 14 (fourteen) days. Starting on day 15 for maintenance. 4 mL 5   VITAMIN D PO Take 1,000 Int'l Units/day by mouth daily.     No current  facility-administered medications on file prior to visit.    Review of Systems  Constitutional:  Negative for chills and fever.  Respiratory:  Negative for cough.   Cardiovascular:  Negative for chest pain and palpitations.  Gastrointestinal:  Negative for nausea and vomiting.  Musculoskeletal:  Positive for arthralgias (right hip).  Skin:  Positive for rash.      Objective:    BP 132/82   Pulse 94   Temp 97.9 F (36.6 C) (Oral)   Ht 5\' 4"  (1.626 m)   Wt 189 lb 1.6 oz (85.8 kg)   SpO2 99%   BMI 32.46 kg/m   BP Readings from Last 3 Encounters:  10/15/22 132/82  08/18/22 (!) 142/69  07/19/22 (!) 158/60   Wt Readings from Last 3 Encounters:  10/15/22 189 lb 1.6 oz (85.8  kg)  08/27/22 189 lb (85.7 kg)  07/19/22 189 lb 12.8 oz (86.1 kg)    Physical Exam Vitals reviewed.  Constitutional:      Appearance: She is well-developed.  Eyes:     Conjunctiva/sclera: Conjunctivae normal.  Cardiovascular:     Rate and Rhythm: Normal rate and regular rhythm.     Pulses: Normal pulses.     Heart sounds: Normal heart sounds.  Pulmonary:     Effort: Pulmonary effort is normal.     Breath sounds: Normal breath sounds. No wheezing, rhonchi or rales.  Skin:    General: Skin is warm and dry.          Comments: Linear vesicular rash left buttocks.  Neurological:     Mental Status: She is alert.  Psychiatric:        Speech: Speech normal.        Behavior: Behavior normal.        Thought Content: Thought content normal.

## 2022-10-15 NOTE — Assessment & Plan Note (Signed)
Allopurinol suspended by nephrology in the setting of rash.  Advised patient to discuss allopurinol with dermatology next week.  Advised patient to follow-up with Dr Darrick Huntsman in regards to resuming allopurinol.

## 2022-10-19 ENCOUNTER — Ambulatory Visit: Payer: Medicare Other | Admitting: Dermatology

## 2022-10-19 ENCOUNTER — Encounter: Payer: Self-pay | Admitting: Dermatology

## 2022-10-19 DIAGNOSIS — B029 Zoster without complications: Secondary | ICD-10-CM | POA: Diagnosis not present

## 2022-10-19 DIAGNOSIS — L209 Atopic dermatitis, unspecified: Secondary | ICD-10-CM

## 2022-10-19 MED ORDER — TRALOKINUMAB-LDRM 150 MG/ML ~~LOC~~ SOSY
300.0000 mg | PREFILLED_SYRINGE | Freq: Once | SUBCUTANEOUS | Status: AC
Start: 2022-10-19 — End: 2022-10-19
  Administered 2022-10-19: 300 mg via SUBCUTANEOUS

## 2022-10-19 NOTE — Progress Notes (Unsigned)
   Follow-Up Visit   Subjective  Jamie Anderson is a 87 y.o. female who presents for the following: Follow up Atopic Dermatitis and Inflamed Seborrheic Keratosis  Patient was seen in office on 09/14/22 by Dr. Roseanne Reno. She was given samples of Adbry and instructed to continue Cerave lotion mixed with clobetasol BID. Which helped a little bit on the hands. Sample of Cibinqo to help with itching was also given. After using creams, itching will get better for a couple hours but will come back. Seborrheic Keratosis were treated with LN2. She was recently diagnosed with shingles 10/15/22.   The patient has spots, moles and lesions to be evaluated, some may be new or changing and the patient may have concern these could be cancer.   The following portions of the chart were reviewed this encounter and updated as appropriate: medications, allergies, medical history  Review of Systems:  No other skin or systemic complaints except as noted in HPI or Assessment and Plan.  Objective  Well appearing patient in no apparent distress; mood and affect are within normal limits.  Relevant exam findings are noted in the Assessment and Plan.    Assessment & Plan  ATOPIC DERMATITIS, chronic, severely flaring, not at patient goal Exam: numerous scattered excoriated pink papules and inflammatory papules on all extremities and trunk coalescing to plaques   Atopic dermatitis (eczema) is a chronic, relapsing, pruritic condition that can significantly affect quality of life. It is often associated with allergic rhinitis and/or asthma and can require treatment with topical medications, phototherapy, or in severe cases biologic injectable medication (Dupixent; Adbry) or Oral JAK inhibitors.  Treatment Plan: Patient received sample injection of Adbry today. NDC: 82956-213-08 LOT: [id number] Exp: 08/06/23 Will continue to get injections of Adbry 300 mg every 14 days. Continue using Cerave/Clobetasol as directed. Need  more time to assess drug efficacy If called by drug company, do not pay copay and tell them our office is working on avenues to reduce the copay Recommend gentle skin care.  HERPES ZOSTER  Exam: dermatomal groups of crusted papules, pustules, and vesicles on right lower back extending from spine to flank. Patient also reports lesions extending to the right lateral thigh  Atopic dermatitis, unspecified type  Related Medications tralokinumab SOSY 300 mg   Herpes zoster without complication  Related Medications valACYclovir (VALTREX) 1000 MG tablet Take 1 tablet (1,000 mg total) by mouth 3 (three) times daily.  traMADol (ULTRAM) 50 MG tablet Take 1 tablet (50 mg total) by mouth every 8 (eight) hours as needed for up to 5 days.  Plan: Flares can be precipitated by stress or other illnesses Reactivation of chickenpox virus Diagnosed by PCP who also started treatment with valacyclovir 1 g TID x 7 days Offered viral swab to confirm VZV. Patient declined   Return in about 2 weeks (around 11/02/2022) for injection.  I, Germaine Pomfret, CMA, am acting as scribe for Elie Goody, MD.   Documentation: I have reviewed the above documentation for accuracy and completeness, and I agree with the above.  Elie Goody, MD

## 2022-10-19 NOTE — Patient Instructions (Signed)

## 2022-10-20 ENCOUNTER — Encounter: Payer: Self-pay | Admitting: Dermatology

## 2022-10-25 ENCOUNTER — Telehealth: Payer: Self-pay | Admitting: Internal Medicine

## 2022-10-25 NOTE — Telephone Encounter (Addendum)
Pt called stating she need another medication for her shingles. Pt stated she saw arnett on 8/9 and she was given prednisone and it is all gone. Pt stated she is still hurting and pt wanted this to be sent to her provider

## 2022-10-26 ENCOUNTER — Emergency Department
Admission: EM | Admit: 2022-10-26 | Discharge: 2022-10-26 | Discharge status: Against Medical Advice | Payer: Medicare Other

## 2022-10-26 ENCOUNTER — Telehealth: Payer: Self-pay

## 2022-10-26 DIAGNOSIS — R1084 Generalized abdominal pain: Secondary | ICD-10-CM | POA: Diagnosis not present

## 2022-10-26 DIAGNOSIS — L209 Atopic dermatitis, unspecified: Secondary | ICD-10-CM

## 2022-10-26 NOTE — Telephone Encounter (Signed)
Okay for me to enter standing orders for patient to continue Adbry every two weeks?  Also when would you like to see patient for office visit follow up? I will make sure to get patient scheduled.

## 2022-10-26 NOTE — ED Notes (Signed)
Patient attempted to be triaged. Patient informed this RN that she was unable to stay for the wait time. Patient encouraged patient to stay to be seen. Patient states that she would be leaving and would return if the pain worsened. NAD noted. Ambulatory upon leaving with visitor.

## 2022-10-26 NOTE — Telephone Encounter (Signed)
Patient called back, no one called her yesterday. She is having a lot pain in her stomach. She is suffering from Bursitis and the shingles. Patient was transferred to Access Nurse, no appointments available.

## 2022-10-26 NOTE — Telephone Encounter (Signed)
Pt stated that she would go to [facility]

## 2022-10-27 MED ORDER — TRALOKINUMAB-LDRM 150 MG/ML ~~LOC~~ SOSY
300.0000 mg | PREFILLED_SYRINGE | SUBCUTANEOUS | Status: AC
Start: 2022-10-27 — End: 2023-01-05
  Administered 2022-11-04: 300 mg via SUBCUTANEOUS

## 2022-10-28 ENCOUNTER — Emergency Department
Admission: EM | Admit: 2022-10-28 | Discharge: 2022-10-28 | Disposition: A | Discharge status: Home / Self Care | Payer: Medicare Other | Attending: Emergency Medicine | Admitting: Emergency Medicine

## 2022-10-28 ENCOUNTER — Encounter: Payer: Self-pay | Admitting: Intensive Care

## 2022-10-28 ENCOUNTER — Emergency Department: Payer: Medicare Other

## 2022-10-28 ENCOUNTER — Other Ambulatory Visit: Payer: Self-pay

## 2022-10-28 DIAGNOSIS — R1033 Periumbilical pain: Secondary | ICD-10-CM | POA: Diagnosis not present

## 2022-10-28 DIAGNOSIS — I2510 Atherosclerotic heart disease of native coronary artery without angina pectoris: Secondary | ICD-10-CM | POA: Insufficient documentation

## 2022-10-28 DIAGNOSIS — I10 Essential (primary) hypertension: Secondary | ICD-10-CM | POA: Insufficient documentation

## 2022-10-28 DIAGNOSIS — R110 Nausea: Secondary | ICD-10-CM | POA: Diagnosis not present

## 2022-10-28 DIAGNOSIS — R109 Unspecified abdominal pain: Secondary | ICD-10-CM | POA: Diagnosis present

## 2022-10-28 DIAGNOSIS — R1031 Right lower quadrant pain: Secondary | ICD-10-CM | POA: Diagnosis not present

## 2022-10-28 DIAGNOSIS — K2900 Acute gastritis without bleeding: Secondary | ICD-10-CM | POA: Insufficient documentation

## 2022-10-28 DIAGNOSIS — B0229 Other postherpetic nervous system involvement: Secondary | ICD-10-CM | POA: Diagnosis not present

## 2022-10-28 LAB — COMPREHENSIVE METABOLIC PANEL
ALT: 21 U/L (ref 0–44)
AST: 27 U/L (ref 15–41)
Albumin: 4.1 g/dL (ref 3.5–5.0)
Alkaline Phosphatase: 80 U/L (ref 38–126)
Anion gap: 11 (ref 5–15)
BUN: 19 mg/dL (ref 8–23)
CO2: 24 mmol/L (ref 22–32)
Calcium: 9.8 mg/dL (ref 8.9–10.3)
Chloride: 99 mmol/L (ref 98–111)
Creatinine, Ser: 0.98 mg/dL (ref 0.44–1.00)
GFR, Estimated: 57 mL/min — ABNORMAL LOW (ref >60)
Glucose, Bld: 110 mg/dL — ABNORMAL HIGH (ref 70–99)
Potassium: 3.4 mmol/L — ABNORMAL LOW (ref 3.5–5.1)
Sodium: 134 mmol/L — ABNORMAL LOW (ref 135–145)
Total Bilirubin: 0.8 mg/dL (ref 0.3–1.2)
Total Protein: 7.4 g/dL (ref 6.5–8.1)

## 2022-10-28 LAB — CBC WITH DIFFERENTIAL/PLATELET
Abs Immature Granulocytes: 0.01 10*3/uL (ref 0.00–0.07)
Basophils Absolute: 0 10*3/uL (ref 0.0–0.1)
Basophils Relative: 1 %
Eosinophils Absolute: 0 10*3/uL (ref 0.0–0.5)
Eosinophils Relative: 1 %
HCT: 34.6 % — ABNORMAL LOW (ref 36.0–46.0)
Hemoglobin: 11.6 g/dL — ABNORMAL LOW (ref 12.0–15.0)
Immature Granulocytes: 0 %
Lymphocytes Relative: 22 %
Lymphs Abs: 1.2 10*3/uL (ref 0.7–4.0)
MCH: 28.1 pg (ref 26.0–34.0)
MCHC: 33.5 g/dL (ref 30.0–36.0)
MCV: 83.8 fL (ref 80.0–100.0)
Monocytes Absolute: 0.5 10*3/uL (ref 0.1–1.0)
Monocytes Relative: 8 %
Neutro Abs: 3.9 10*3/uL (ref 1.7–7.7)
Neutrophils Relative %: 68 %
Platelets: 238 10*3/uL (ref 150–400)
RBC: 4.13 MIL/uL (ref 3.87–5.11)
RDW: 17.8 % — ABNORMAL HIGH (ref 11.5–15.5)
WBC: 5.6 10*3/uL (ref 4.0–10.5)
nRBC: 0 % (ref 0.0–0.2)

## 2022-10-28 LAB — LIPASE, BLOOD: Lipase: 50 U/L (ref 11–51)

## 2022-10-28 MED ORDER — PANTOPRAZOLE SODIUM 40 MG PO TBEC: 40.0000 mg | DELAYED_RELEASE_TABLET | Freq: Every day | ORAL | 3 refills | Status: DC

## 2022-10-28 MED ORDER — IOHEXOL 300 MG/ML  SOLN
100.0000 mL | Freq: Once | INTRAMUSCULAR | Status: AC | PRN
  Administered 2022-10-28: 100 mL via INTRAVENOUS

## 2022-10-28 MED ORDER — SUCRALFATE 1 G PO TABS: 1.0000 g | ORAL_TABLET | Freq: Four times a day (QID) | ORAL | 1 refills | Status: DC

## 2022-10-28 MED ORDER — GABAPENTIN 100 MG PO CAPS: 100.0000 mg | ORAL_CAPSULE | Freq: Three times a day (TID) | ORAL | 2 refills | Status: DC

## 2022-10-28 NOTE — ED Notes (Signed)
See triage note  Presents with lower abd pain  States she was dx'd with shingles about 1 week ago or    But states her pain is in lower abd  Positive nausea

## 2022-10-28 NOTE — Discharge Instructions (Addendum)
Your blood work was reassuring today.  Your CT scan did not show any acute abnormalities.  Your EKG was normal.  Please schedule an appointment with your PCP to discuss a medication reconciliation given your concerns about being on multiple medications. They can also advise on how to increase the dosage of gabapentin.

## 2022-10-28 NOTE — ED Triage Notes (Signed)
Patient presents today with right sided, lower abdominal pain with nausea. Sent by PCP for scans on abdomen. Reports she had a xray of abdomen today at PCP.  Patient also diagnosed with right sided shingles X1 week ago and has finished her dose of medications. Reports shingles start in right pelvis area and spread around to buttocks.   History appendix removal

## 2022-10-28 NOTE — ED Provider Notes (Signed)
[facility] Provider Note    Event Date/Time   First MD Initiated Contact with Patient 10/28/22 1525     (approximate)   History   Abdominal Pain and Herpes Zoster   HPI  Jamie Anderson is a 87 y.o. female of atopic dermatitis, herpes zoster, trochanteric bursitis, HTN and CAD who presents for evaluation of abdominal pain and shingles.      Her abdominal pain began on Monday, she describes it as being periumbilical.  She endorses nausea but denies vomiting.  She says she feels "sick" and is very distended.  She is had a loss of appetite.  Denies fever, diarrhea, constipation.  Patient has had shingles on her right buttock proximal thigh and groin for about a week.  She was treated with antiviral and steroids.  She is still having ongoing pain.  Abdominal surgical history includes appendectomy and right oophorectomy.     Physical Exam   Triage Vital Signs: ED Triage Vitals  Encounter Vitals Group     BP 10/28/22 1514 (!) 155/87     Systolic BP Percentile --      Diastolic BP Percentile --      Pulse Rate 10/28/22 1514 77     Resp 10/28/22 1514 16     Temp 10/28/22 1514 98.1 F (36.7 C)     Temp Source 10/28/22 1514 Oral     SpO2 10/28/22 1514 98 %     Weight 10/28/22 1516 180 lb (81.6 kg)     Height 10/28/22 1516 5\' 4"  (1.626 m)     Head Circumference --      Peak Flow --      Pain Score 10/28/22 1515 4     Pain Loc --      Pain Education --      Exclude from Growth Chart --     Most recent vital signs: Vitals:   10/28/22 1514  BP: (!) 155/87  Pulse: 77  Resp: 16  Temp: 98.1 F (36.7 C)  SpO2: 98%    General: Awake, no distress.  CV:  Good peripheral perfusion.  RRR. Resp:  Normal effort.  CTAB. Abd:  Distended, periumbilical tender to palpation, soft, bowel sounds appropriate.    ED Results / Procedures / Treatments   Labs (all labs ordered are listed, but only abnormal results are displayed) Labs Reviewed   COMPREHENSIVE METABOLIC PANEL - Abnormal; Notable for the following components:      Result Value   Sodium 134 (*)    Potassium 3.4 (*)    Glucose, Bld 110 (*)    GFR, Estimated 57 (*)    All other components within normal limits  CBC WITH DIFFERENTIAL/PLATELET - Abnormal; Notable for the following components:   Hemoglobin 11.6 (*)    HCT 34.6 (*)    RDW 17.8 (*)    All other components within normal limits  LIPASE, BLOOD    EKG  NSR with VR of 79 bpm.   RADIOLOGY  CT scan obtained, interpreted the images as well as reviewed the radiologist report.   PROCEDURES:  Critical Care performed: No  Procedures   MEDICATIONS ORDERED IN ED: Medications  iohexol (OMNIPAQUE) 300 MG/ML solution 100 mL (100 mLs Intravenous Contrast Given 10/28/22 1617)     IMPRESSION / MDM / ASSESSMENT AND PLAN / ED COURSE  I reviewed the triage vital signs and the nursing notes.  Differential diagnosis includes, but is not limited to, bowel obstruction, GERD, constipation, peptic ulcer, peritonitis.  Patient's presentation is most consistent with acute complicated illness / injury requiring diagnostic workup.  CBC notable for mild anemia, CMP and lipase unremarkable.  CT scan obtained, interpreted the images as well as reviewed the radiologist report.  There was no CT evidence of an acute intra-abdominal or pelvic abnormality.  EKG obtained to rule out a cardiac cause of the abdominal pain.  EKG showed NSR with VR of 79 bpm.  Given patient's essentially negative workup I believe she has gastritis secondary to prednisone usage for her shingles.  Patient will be prescribed Carafate and I advised her to continue taking her Protonix.  I will also send patient a prescription for gabapentin to treat her postherpetic neuralgia.  Patient's family member expressed concerns about her being on multiple medications.  I recommended that patient's schedule an appointment with  her PCP for a medication reconciliation.  I also advised patient to follow-up with her PCP regarding titration of the gabapentin.  Patient voiced understanding, all questions were answered and she was stable at discharge.      FINAL CLINICAL IMPRESSION(S) / ED DIAGNOSES   Final diagnoses:  Other acute gastritis, presence of bleeding unspecified     Rx / DC Orders   ED Discharge Orders          Ordered    sucralfate (CARAFATE) 1 g tablet  4 times daily        10/28/22 1751    pantoprazole (PROTONIX) 40 MG tablet  Daily       Note to Pharmacy: Please send a replace/new response with 90-Day Supply if appropriate to maximize member benefit. Requesting 1 year supply.   10/28/22 1751    gabapentin (NEURONTIN) 100 MG capsule  3 times daily        10/28/22 1802             Note:  This document was prepared using Dragon voice recognition software and may include unintentional dictation errors.   Cameron Ali, PA-C 10/28/22 Aldona Lento    Shaune Pollack, MD 10/28/22 2200

## 2022-10-28 NOTE — ED Provider Triage Note (Signed)
Emergency Medicine Provider Triage Evaluation Note  OLER TUSZYNSKI, a 87 y.o. female  was evaluated in triage.  Pt complains of RLQ abd pain, intermittently. She is recovering from shingles affecting the RLQ and right buttocks, simultaneously. She denies NVD, FCS, dysuria, or CP. She is s/p appy.   Review of Systems  Positive: RLQ pain Negative: FCS, NVD  Physical Exam  BP (!) 155/87 (BP Location: Left Arm)   Pulse 77   Temp 98.1 F (36.7 C) (Oral)   Resp 16   Ht 5\' 4"  (1.626 m)   Wt 81.6 kg   SpO2 98%   BMI 30.90 kg/m  Gen:   Awake, no distress  NAD Resp:  Normal effort CTA MSK:   Moves extremities without difficulty  Other:  Soft, nontender. Normal bowel sounds  Medical Decision Making  Medically screening exam initiated at 3:18 PM.  Appropriate orders placed.  [name] was informed that the remainder of the evaluation will be completed by another provider, this initial triage assessment does not replace that evaluation, and the importance of remaining in the ED until their evaluation is complete.  RLQ pain intermittently. WNL labs from [facility] on chart from today.    Lissa Hoard, PA-C 10/28/22 1520

## 2022-10-29 ENCOUNTER — Other Ambulatory Visit: Payer: Self-pay | Admitting: Cardiovascular Disease

## 2022-10-29 DIAGNOSIS — I480 Paroxysmal atrial fibrillation: Secondary | ICD-10-CM

## 2022-10-29 NOTE — Telephone Encounter (Signed)
Prescription refill request for Eliquis received. Indication:afib Last office visit:10/23 Scr:0.98  8/24 Age: 87 Weight:81.6  kg  Prescription refilled

## 2022-11-01 ENCOUNTER — Telehealth: Payer: Self-pay | Admitting: *Deleted

## 2022-11-01 NOTE — Progress Notes (Signed)
  Care Coordination   Note   11/01/2022 Name: Jamie Anderson MRN: [id number] DOB: 1935/09/09  Jamie Anderson is a 87 y.o. year old female who sees Darrick Huntsman, Mar Daring, MD for primary care. I reached out to [name] by phone today to offer care coordination services.  [name] was given information about Care Coordination services today including:   The Care Coordination services include support from the care team which includes your Nurse Coordinator, Clinical Social Worker, or Pharmacist.  The Care Coordination team is here to help remove barriers to the health concerns and goals most important to you. Care Coordination services are voluntary, and the patient may decline or stop services at any time by request to their care team member.   Care Coordination Consent Status: Patient did not agree to participate in care coordination services at this time.  Follow up plan:  none indicated - pt appreciative but declines to schedule at this time - has 2 supportive daughters and friends that she has for support.   Encounter Outcome:  Pt. Refused  Burman Nieves, CCMA Care Coordination Care Guide Direct Dial: [phone]

## 2022-11-02 ENCOUNTER — Ambulatory Visit: Payer: Medicare Other

## 2022-11-04 ENCOUNTER — Ambulatory Visit (INDEPENDENT_AMBULATORY_CARE_PROVIDER_SITE_OTHER): Payer: Medicare Other

## 2022-11-04 DIAGNOSIS — L209 Atopic dermatitis, unspecified: Secondary | ICD-10-CM | POA: Diagnosis not present

## 2022-11-04 NOTE — Progress Notes (Signed)
Patient here today for Adbry injections for chronic and persistent atopic dermatitis.    Adbry 150mg /mL X2 syringes injected into right arm. Patient tolerated injections well.   LOT: [id number] EXP: 08/2024   Jamie Anderson, RMA

## 2022-11-09 ENCOUNTER — Telehealth: Payer: Self-pay

## 2022-11-09 NOTE — Telephone Encounter (Signed)
Transition Care Management Unsuccessful Follow-up Telephone Call  Date of discharge and from where:  [place] 8/22  Attempts:  1st Attempt  Reason for unsuccessful TCM follow-up call:  No answer/busy   Lenard Forth Kerens  [facility], [facility] Guide, Phone: [phone] Website: [email or web address]

## 2022-11-09 NOTE — Telephone Encounter (Signed)
Transition Care Management Unsuccessful Follow-up Telephone Call  Date of discharge and from where:  [place] 8/22  Attempts:  2nd Attempt  Reason for unsuccessful TCM follow-up call:  No answer/busy   Jamie Anderson [name]  [facility], [facility] Guide, Phone: [phone] Website: [email or web address]

## 2022-11-11 ENCOUNTER — Ambulatory Visit: Payer: Medicare Other | Admitting: Nurse Practitioner

## 2022-11-17 ENCOUNTER — Ambulatory Visit: Payer: Medicare Other | Admitting: Dermatology

## 2022-11-17 VITALS — BP 111/56 | HR 79

## 2022-11-17 DIAGNOSIS — L720 Epidermal cyst: Secondary | ICD-10-CM | POA: Diagnosis not present

## 2022-11-17 DIAGNOSIS — L729 Follicular cyst of the skin and subcutaneous tissue, unspecified: Secondary | ICD-10-CM

## 2022-11-17 DIAGNOSIS — Z85828 Personal history of other malignant neoplasm of skin: Secondary | ICD-10-CM

## 2022-11-17 DIAGNOSIS — C44729 Squamous cell carcinoma of skin of left lower limb, including hip: Secondary | ICD-10-CM | POA: Diagnosis not present

## 2022-11-17 DIAGNOSIS — R21 Rash and other nonspecific skin eruption: Secondary | ICD-10-CM

## 2022-11-17 DIAGNOSIS — Z79899 Other long term (current) drug therapy: Secondary | ICD-10-CM | POA: Diagnosis not present

## 2022-11-17 DIAGNOSIS — L209 Atopic dermatitis, unspecified: Secondary | ICD-10-CM | POA: Diagnosis not present

## 2022-11-17 DIAGNOSIS — L509 Urticaria, unspecified: Secondary | ICD-10-CM | POA: Diagnosis not present

## 2022-11-17 DIAGNOSIS — D485 Neoplasm of uncertain behavior of skin: Secondary | ICD-10-CM

## 2022-11-17 MED ORDER — CLOBETASOL PROPIONATE 0.05 % EX SOLN: CUTANEOUS | 1 refills | Status: DC

## 2022-11-17 NOTE — Patient Instructions (Addendum)
Eczema Skin Care  Buy TWO 16oz jars of CeraVe moisturizing cream  [facility], [facility], [facility] (no prescription needed)  Costs about $15 per jar   Jar #1: Use as a moisturizer as needed. Can be applied to any area of the body. Use twice daily to unaffected areas.  Jar #2: Pour one 50ml bottle of clobetasol 0.05% solution into jar, mix well. Label this jar to indicate the medication has been added. Use twice daily to affected areas. Do not apply to face, groin or underarms.  Moisturizer may burn or sting initially. Try for at least 4 weeks.    Wound Care Instructions  Cleanse wound gently with soap and water once a day then pat dry with clean gauze. Apply a thin coat of Petrolatum (petroleum jelly, "Vaseline") over the wound (unless you have an allergy to this). We recommend that you use a new, sterile tube of Vaseline. Do not pick or remove scabs. Do not remove the yellow or white "healing tissue" from the base of the wound.  Cover the wound with fresh, clean, nonstick gauze and secure with paper tape. You may use Band-Aids in place of gauze and tape if the wound is small enough, but would recommend trimming much of the tape off as there is often too much. Sometimes Band-Aids can irritate the skin.  You should call the office for your biopsy report after 1 week if you have not already been contacted.  If you experience any problems, such as abnormal amounts of bleeding, swelling, significant bruising, significant pain, or evidence of infection, please call the office immediately.  FOR ADULT SURGERY PATIENTS: If you need something for pain relief you may take 1 extra strength Tylenol (acetaminophen) AND 2 Ibuprofen (200mg  each) together every 4 hours as needed for pain. (do not take these if you are allergic to them or if you have a reason you should not take them.) Typically, you may only need pain medication for 1 to 3 days.   Due to recent changes in healthcare laws, you may see results of your  pathology and/or laboratory studies on MyChart before the doctors have had a chance to review them. We understand that in some cases there may be results that are confusing or concerning to you. Please understand that not all results are received at the same time and often the doctors may need to interpret multiple results in order to provide you with the best plan of care or course of treatment. Therefore, we ask that you please give [place] 2 business days to thoroughly review all your results before contacting the office for clarification. Should we see a critical lab result, you will be contacted sooner.   If You Need Anything After Your Visit  If you have any questions or concerns for your doctor, please call our main line at [phone] and press option 4 to reach your doctor's medical assistant. If no one answers, please leave a voicemail as directed and we will return your call as soon as possible. Messages left after 4 pm will be answered the following business day.   You may also send [place] a message via MyChart. We typically respond to MyChart messages within 1-2 business days.  For prescription refills, please ask your pharmacy to contact our office. Our fax number is [phone].  If you have an urgent issue when the clinic is closed that cannot wait until the next business day, you can page your doctor at the number below.    Please  note that while we do our best to be available for urgent issues outside of office hours, we are not available 24/7.   If you have an urgent issue and are unable to reach [place], you may choose to seek medical care at your doctor's office, retail clinic, urgent care center, or emergency room.  If you have a medical emergency, please immediately call 911 or go to the emergency department.  Pager Numbers  - Dr. Gwen Pounds: [phone]  - Dr. Roseanne Reno: [phone]  - Dr. Katrinka Blazing: [phone]   In the event of inclement weather, please call our main line at  [phone] for an update on the status of any delays or closures.  Dermatology Medication Tips: Please keep the boxes that topical medications come in in order to help keep track of the instructions about where and how to use these. Pharmacies typically print the medication instructions only on the boxes and not directly on the medication tubes.   If your medication is too expensive, please contact our office at [phone] option 4 or send [place] a message through MyChart.   We are unable to tell what your co-pay for medications will be in advance as this is different depending on your insurance coverage. However, we may be able to find a substitute medication at lower cost or fill out paperwork to get insurance to cover a needed medication.   If a prior authorization is required to get your medication covered by your insurance company, please allow [place] 1-2 business days to complete this process.  Drug prices often vary depending on where the prescription is filled and some pharmacies may offer cheaper prices.  The website www.goodrx.com contains coupons for medications through different pharmacies. The prices here do not account for what the cost may be with help from insurance (it may be cheaper with your insurance), but the website can give you the price if you did not use any insurance.  - You can print the associated coupon and take it with your prescription to the pharmacy.  - You may also stop by our office during regular business hours and pick up a GoodRx coupon card.  - If you need your prescription sent electronically to a different pharmacy, notify our office through [facility] or by phone at [phone] option 4.     Si Usted Necesita Algo Despus de Su Visita  Tambin puede enviarnos un mensaje a travs de Clinical cytogeneticist. Por lo general respondemos a los mensajes de MyChart en el transcurso de 1 a 2 das hbiles.  Para renovar recetas, por favor pida a su farmacia que se  ponga en contacto con nuestra oficina. Annie Sable de fax es [place] [phone].  Si tiene un asunto urgente cuando la clnica est cerrada y que no puede esperar hasta el siguiente da hbil, puede llamar/localizar a su doctor(a) al nmero que aparece a continuacin.   Por favor, tenga en cuenta que aunque hacemos todo lo posible para estar disponibles para asuntos urgentes fuera del horario de [place], no estamos disponibles las 24 horas del da, los 7 [address]  [address] de la [place].   Si tiene un problema urgente y no puede comunicarse con nosotros, puede optar por buscar atencin mdica  en el consultorio de su doctor(a), en una clnica privada, en un centro de atencin urgente o en una sala de emergencias.  Si tiene Engineer, drilling, por favor llame inmediatamente al 911 o vaya a la sala de emergencias.  Nmeros de bper  - Dr. Gwen Pounds:  [phone]  - Dra. Roseanne Reno: [phone]  - Dr. Katrinka Blazing: [phone]   En caso de inclemencias del tiempo, por favor llame a Lacy Duverney principal al [phone] para una actualizacin sobre el [place] de cualquier retraso o cierre.  Consejos para la medicacin en dermatologa: Por favor, guarde las cajas en las que vienen los medicamentos de uso tpico para ayudarle a seguir las instrucciones sobre dnde y cmo usarlos. Las farmacias generalmente imprimen las instrucciones del medicamento slo en las cajas y no directamente en los tubos del [place].   Si su medicamento es muy caro, por favor, pngase en contacto con Rolm Gala llamando al [phone] y presione la opcin 4 o envenos un mensaje a travs de Clinical cytogeneticist.   No podemos decirle cul ser su copago por los medicamentos por adelantado ya que esto es diferente dependiendo de la cobertura de su seguro. Sin embargo, es posible que podamos encontrar un medicamento sustituto a Audiological scientist un formulario para que el seguro cubra el medicamento que se considera necesario.   Si se requiere  una autorizacin previa para que su compaa de seguros [place] su medicamento, por favor permtanos de 1 a 2 das hbiles para completar [address]  Los precios de los medicamentos varan con frecuencia dependiendo del Environmental consultant de dnde se surte la receta y alguna farmacias pueden ofrecer precios ms baratos.  El sitio web www.goodrx.com tiene cupones para medicamentos de Health and safety inspector. Los precios aqu no tienen en cuenta lo que podra costar con la ayuda del seguro (puede ser ms barato con su seguro), pero el sitio web puede darle el precio si no utiliz Tourist information centre manager.  - Puede imprimir el cupn correspondiente y llevarlo con su receta a la farmacia.  - Tambin puede pasar por nuestra oficina durante el horario de atencin regular y Education officer, museum una tarjeta de cupones de GoodRx.  - Si necesita que su receta se enve electrnicamente a una farmacia diferente, informe a nuestra oficina a travs de MyChart de [place] o por telfono llamando al [phone] y presione la opcin 4.

## 2022-11-17 NOTE — Progress Notes (Signed)
Follow-Up Visit   Subjective  Jamie Anderson is a 87 y.o. female who presents for the following: Atopic Dermatitis. Patient feels like some areas are improving, but still having flares. She is on Adbry injections (samples- pt approved but too expensive) and using clobetasol/cerave mix. Patient breaks out and has itching after eating. No new medications. She states she has had allergy testing years ago and has multiple allergies.  She would like to get retested.  She also has h/o of generalized GA.  The following portions of the chart were reviewed this encounter and updated as appropriate: medications, allergies, medical history  Review of Systems:  No other skin or systemic complaints except as noted in HPI or Assessment and Plan.  Objective  Well appearing patient in no apparent distress; mood and affect are within normal limits.  Areas Examined: Face, arms, legs  Relevant physical exam findings are noted in the Assessment and Plan.  L mid pretibia 0.8 cm pink scaly papule       R post thigh Scattered pink patches and multiple excoriated papules on the arms, legs, post neck with scattered firm scaly nodules    Assessment & Plan   Neoplasm of uncertain behavior of skin L mid pretibia  Epidermal / dermal shaving  Lesion diameter (cm):  0.8 Informed consent: discussed and consent obtained   Patient was prepped and draped in usual sterile fashion: Area prepped with alcohol. Anesthesia: the lesion was anesthetized in a standard fashion   Anesthetic:  1% lidocaine w/ epinephrine 1-100,000 buffered w/ 8.4% NaHCO3 Instrument used: flexible razor blade   Hemostasis achieved with: pressure, aluminum chloride and electrodesiccation   Outcome: patient tolerated procedure well   Post-procedure details comment:  Ointment and small bandage applied  Destruction of lesion  Destruction method: electrodesiccation and curettage   Informed consent: discussed and consent obtained    Curettage performed in three different directions: Yes   Electrodesiccation performed over the curetted area: Yes   Final wound size (cm):  1.1 Hemostasis achieved with:  pressure, aluminum chloride and electrodesiccation Outcome: patient tolerated procedure well with no complications   Post-procedure details: wound care instructions given   Post-procedure details comment:  Ointment and bandage applied.  Specimen 1 - Surgical pathology Differential Diagnosis: Prurigo Nodule r/o SCC Check Margins: No EDC today  Rash and other nonspecific skin eruption R post thigh  Very itchy, on Adbry but still itching  Differential Diagnosis: Dermatitis vs Urticaria vs GA  Skin / nail biopsy - R post thigh Type of biopsy: punch   Informed consent: discussed and consent obtained   Patient was prepped and draped in usual sterile fashion: Area prepped with alcohol. Anesthesia: the lesion was anesthetized in a standard fashion   Anesthetic:  1% lidocaine w/ epinephrine 1-100,000 buffered w/ 8.4% NaHCO3 Punch size:  3.5 mm Suture size:  4-0 Suture type: nylon   Suture removal (days):  7 Hemostasis achieved with: suture and pressure   Outcome: patient tolerated procedure well   Post-procedure details: wound care instructions given   Post-procedure details comment:  Ointment and pressure dressing applied  Specimen 2 - Surgical pathology Differential Diagnosis: Dermatitis vs Urticaria vs GA Intermittent itchy rash, non-responsive to Dupixent, Adbry Check Margins: No 3.5 mm punch  SCC (squamous cell carcinoma), leg, left  Long-term use of high-risk medication  History of SCC (squamous cell carcinoma) of skin  Cyst of skin  ATOPIC DERMATITIS Exam: Multiple pink scaly excoriated papules/nodules and pink edematous patches on the  arms, legs, posterior neck. 15% BSA  Chronic and persistent condition with duration or expected duration over one year. Condition is bothersome/symptomatic for  patient. Currently flared. Persistent itching. Has failed Dupixent and not completely responsive to Adbry.   Atopic dermatitis - Severe, on Adbry samples (biologic medication).  Atopic dermatitis (eczema) is a chronic, relapsing, pruritic condition that can significantly affect quality of life. It is often associated with allergic rhinitis and/or asthma and can require treatment with topical medications, phototherapy, or in severe cases biologic medications, which require long term medication management.    Treatment Plan: Will refer patient to allergist for food allergies. Patient has itchy patches come up after eating. Pt may need to d/c Adbry injections prior to appointment.  Continue clobetasol/CeraVe mix once to twice daily, as directed. Avoid applying to face, groin, and axilla. Use as directed. Long-term use can cause thinning of the skin.  Continue Adbry injections. Will submit for patient assistance. Patient due for injection, but no samples in office today. Patient will have injection in 1 week at follow-up SR appointment.  Recommend gentle skin care.  Tralokinumab Carvel Getting) is a treatment given by injection for adults with moderate-to-severe atopic dermatitis. Goal is control of skin condition, not cure. It is given as 4 injections at the first dose followed by 2 injections ever 2 weeks thereafter.  Potential side effects include allergic reaction, injection site reactions and conjunctivitis (inflammation of the eyes).  The use of Adbry requires long term medication management, including periodic office visits.   Topical steroids (such as triamcinolone, fluocinolone, fluocinonide, mometasone, clobetasol, halobetasol, betamethasone, hydrocortisone) can cause thinning and lightening of the skin if they are used for too long in the same area. Your physician has selected the right strength medicine for your problem and area affected on the body. Please use your medication only as directed by  your physician to prevent side effects.   EPIDERMAL INCLUSION CYST Exam: Subcutaneous nodule at left posterior parietal scalp  Benign-appearing. Exam most consistent with an epidermal inclusion cyst. Discussed that a cyst is a benign growth that can grow over time and sometimes get irritated or inflamed. Recommend observation if it is not bothersome. Discussed option of surgical excision to remove it if it is growing, symptomatic, or other changes noted. Please call for new or changing lesions so they can be evaluated.  HISTORY OF SQUAMOUS CELL CARCINOMA OF THE SKIN - No evidence of recurrence today R leg - Recommend regular full body skin exams - Recommend daily broad spectrum sunscreen SPF 30+ to sun-exposed areas, reapply every 2 hours as needed.  - Call if any new or changing lesions are noted between office visits     Return in about 1 week (around 11/24/2022) for suture removal, bx f/u, Adbry injection.  ICherlyn Labella, CMA, am acting as scribe for Willeen Niece, MD .   Documentation: I have reviewed the above documentation for accuracy and completeness, and I agree with the above.  Willeen Niece, MD

## 2022-11-19 LAB — SURGICAL PATHOLOGY

## 2022-11-23 ENCOUNTER — Ambulatory Visit: Payer: Medicare Other | Admitting: Dermatology

## 2022-11-23 ENCOUNTER — Other Ambulatory Visit: Payer: Self-pay

## 2022-11-23 DIAGNOSIS — L509 Urticaria, unspecified: Secondary | ICD-10-CM | POA: Diagnosis not present

## 2022-11-23 DIAGNOSIS — L2089 Other atopic dermatitis: Secondary | ICD-10-CM

## 2022-11-23 DIAGNOSIS — L821 Other seborrheic keratosis: Secondary | ICD-10-CM | POA: Diagnosis not present

## 2022-11-23 DIAGNOSIS — L720 Epidermal cyst: Secondary | ICD-10-CM

## 2022-11-23 DIAGNOSIS — D692 Other nonthrombocytopenic purpura: Secondary | ICD-10-CM | POA: Diagnosis not present

## 2022-11-23 DIAGNOSIS — C4492 Squamous cell carcinoma of skin, unspecified: Secondary | ICD-10-CM

## 2022-11-23 DIAGNOSIS — L729 Follicular cyst of the skin and subcutaneous tissue, unspecified: Secondary | ICD-10-CM

## 2022-11-23 MED ORDER — MUPIROCIN 2 % EX OINT
TOPICAL_OINTMENT | CUTANEOUS | 1 refills | Status: DC
Start: 2022-11-23 — End: 2022-11-30

## 2022-11-23 MED ORDER — LEVOCETIRIZINE DIHYDROCHLORIDE 5 MG PO TABS: 5.0000 mg | ORAL_TABLET | Freq: Every evening | ORAL | 5 refills | Status: DC

## 2022-11-23 NOTE — Progress Notes (Signed)
Follow-Up Visit   Subjective  Jamie Anderson is a 87 y.o. female who presents for the following: 1 week follow-up biopsies.  Left mid pretibia is biopsy proven SCC, treated with EDC. Right posterior thigh is biopsy proven urticaria.   Patient breaks out and has itching after eating. She states she has had allergy testing years ago and has multiple allergies.  She would like to get retested.  She itches all over for about 3 months.  She started a new statin drug around the same time.  The following portions of the chart were reviewed this encounter and updated as appropriate: medications, allergies, medical history  Review of Systems:  No other skin or systemic complaints except as noted in HPI or Assessment and Plan.  Objective  Well appearing patient in no apparent distress; mood and affect are within normal limits.  A focused examination was performed of the following areas: Face, legs Relevant physical exam findings are noted in the Assessment and Plan.  left mid pretibia Healing biopsy and EDC site.    Assessment & Plan   Squamous cell carcinoma of skin left mid pretibia  mupirocin ointment (BACTROBAN) 2 % Apply to affected area left leg once daily with bandage changes.  Biopsy proven.  Healing. Continue wound care. Start Mupirocin 2% ointment apply to aa once a day with bandage changes.   URTICARIA Biopsy proven, right posterior thigh.  Suture removed today.   Exam: pink edematous patches with multiple pink crusted excoriations and scratch purpura arms/legs/post neck  Chronic and persistent condition with duration or expected duration over one year. Condition is symptomatic/ bothersome to patient. Not currently at goal.  Pt was treated for Atopic dermatitis with Dupixent and Adbry injections, without improvement.     Urticaria or hives is a pink to red patchy whelp- like rash of the skin that typically itches and it is the result of histamine release in the  skin.   Hives may have multiple causes including stress, medications, infections, and systemic illness.  Sometimes there is a family history of chronic urticaria.   "Physical urticarias" may be caused by pressure (dermatographism), heat, sun, cold, vibration.  Insect bites can cause "papular urticaria". It is often difficult to find the cause of generalized hives.  Statistically, 70% of the time a cause of generalized hives is not found.  Sometimes hives can spontaneously resolve. Other times hives can persist and when it does, and no cause is found, and it has been at least 6 weeks since started, it is called "chronic idiopathic urticaria". Antihistamines are the mainstay for treatment.  In severe cases Xolair injections may be used.   Treatment Plan: Will d/c Adbry injections. Referral to [facility] sent for food allergy testing and eval of urticaria, possible Xolair  Continue clobetasol/CeraVe mix once to twice daily, as directed. Avoid applying to face, groin, and axilla. Use as directed. Long-term use can cause thinning of the skin.  Start Xyzal 5 MG take 1 po every day dsp #30 5Rf.  Continue Zyrtec 10 mg PO qhs  Patient will d/c Crestor and Simvastatin temporarily to see if itching improves.   Patient has already stopped taking allopurinol, no improvement with itching.   Purpura - Chronic; persistent and recurrent.  Treatable, but not curable. - Violaceous macules and patches - Benign - Related to trauma, age, sun damage and/or use of blood thinners, chronic use of topical and/or oral steroids - Observe - Can use OTC arnica containing moisturizer such as  Dermend Bruise Formula if desired - Call for worsening or other concerns  SEBORRHEIC KERATOSIS - Stuck-on, waxy, tan-brown papules and/or plaques  - Benign-appearing - Discussed benign etiology and prognosis. - Observe - Call for any changes  EPIDERMAL INCLUSION CYST Exam: Subcutaneous nodule at left posterior parietal  scalp 0.5 cm  Benign-appearing. Exam most consistent with an epidermal inclusion cyst. Discussed that a cyst is a benign growth that can grow over time and sometimes get irritated or inflamed. Recommend observation if it is not bothersome. Discussed option of surgical excision to remove it if it is growing, symptomatic, or other changes noted. Please call for new or changing lesions so they can be evaluated.  Return in about 6 months (around 05/23/2023) for TBSE, Hx SCC.  ICherlyn Labella, CMA, am acting as scribe for Willeen Niece, MD .   Documentation: I have reviewed the above documentation for accuracy and completeness, and I agree with the above.  Willeen Niece, MD

## 2022-11-23 NOTE — Patient Instructions (Addendum)
Stop taking Crestor and Simvastatin temporarily to see if itching improves.   Referral sent to [facility] [address] [place], [place]  [address] [phone] (phone)  Start mupirocin 2% ointment - apply once daily to left lower leg with bandage changes.  Start Xyzal 5 MG take 1 tablet daily. (Prescription sent to pharmacy)  Due to recent changes in healthcare laws, you may see results of your pathology and/or laboratory studies on MyChart before the doctors have had a chance to review them. We understand that in some cases there may be results that are confusing or concerning to you. Please understand that not all results are received at the same time and often the doctors may need to interpret multiple results in order to provide you with the best plan of care or course of treatment. Therefore, we ask that you please give [place] 2 business days to thoroughly review all your results before contacting the office for clarification. Should we see a critical lab result, you will be contacted sooner.   If You Need Anything After Your Visit  If you have any questions or concerns for your doctor, please call our main line at [phone] and press option 4 to reach your doctor's medical assistant. If no one answers, please leave a voicemail as directed and we will return your call as soon as possible. Messages left after 4 pm will be answered the following business day.   You may also send [place] a message via MyChart. We typically respond to MyChart messages within 1-2 business days.  For prescription refills, please ask your pharmacy to contact our office. Our fax number is [phone].  If you have an urgent issue when the clinic is closed that cannot wait until the next business day, you can page your doctor at the number below.    Please note that while we do our best to be available for urgent issues outside of office hours, we are not available 24/7.   If  you have an urgent issue and are unable to reach [place], you may choose to seek medical care at your doctor's office, retail clinic, urgent care center, or emergency room.  If you have a medical emergency, please immediately call 911 or go to the emergency department.  Pager Numbers  - Dr. Gwen Pounds: [phone]  - Dr. Roseanne Reno: [phone]  - Dr. Katrinka Blazing: [phone]   In the event of inclement weather, please call our main line at [phone] for an update on the status of any delays or closures.  Dermatology Medication Tips: Please keep the boxes that topical medications come in in order to help keep track of the instructions about where and how to use these. Pharmacies typically print the medication instructions only on the boxes and not directly on the medication tubes.   If your medication is too expensive, please contact our office at [phone] option 4 or send [place] a message through MyChart.   We are unable to tell what your co-pay for medications will be in advance as this is different depending on your insurance coverage. However, we may be able to find a substitute medication at lower cost or fill out paperwork to get insurance to cover a needed medication.   If a prior authorization is required to get your medication covered by your insurance company, please allow [place] 1-2 business days to complete this process.  Drug prices often vary depending on where the prescription is filled and some pharmacies may  offer cheaper prices.  The website www.goodrx.com contains coupons for medications through different pharmacies. The prices here do not account for what the cost may be with help from insurance (it may be cheaper with your insurance), but the website can give you the price if you did not use any insurance.  - You can print the associated coupon and take it with your prescription to the pharmacy.  - You may also stop by our office during regular business hours and pick up a GoodRx  coupon card.  - If you need your prescription sent electronically to a different pharmacy, notify our office through [facility] or by phone at [phone] option 4.     Si Usted Necesita Algo Despus de Su Visita  Tambin puede enviarnos un mensaje a travs de Clinical cytogeneticist. Por lo general respondemos a los mensajes de MyChart en el transcurso de 1 a 2 das hbiles.  Para renovar recetas, por favor pida a su farmacia que se ponga en contacto con nuestra oficina. Annie Sable de fax es [place] [phone].  Si tiene un asunto urgente cuando la clnica est cerrada y que no puede esperar hasta el siguiente da hbil, puede llamar/localizar a su doctor(a) al nmero que aparece a continuacin.   Por favor, tenga en cuenta que aunque hacemos todo lo posible para estar disponibles para asuntos urgentes fuera del horario de [place], no estamos disponibles las 24 horas del da, los 7 [address]  [address] de la [place].   Si tiene un problema urgente y no puede comunicarse con nosotros, puede optar por buscar atencin mdica  en el consultorio de su doctor(a), en una clnica privada, en un centro de atencin urgente o en una sala de emergencias.  Si tiene Engineer, drilling, por favor llame inmediatamente al 911 o vaya a la sala de emergencias.  Nmeros de bper  - Dr. Gwen Pounds: [phone]  - Dra. Roseanne Reno: [phone]  - Dr. Katrinka Blazing: [phone]   En caso de inclemencias del tiempo, por favor llame a Lacy Duverney principal al [phone] para una actualizacin sobre el [place] de cualquier retraso o cierre.  Consejos para la medicacin en dermatologa: Por favor, guarde las cajas en las que vienen los medicamentos de uso tpico para ayudarle a seguir las instrucciones sobre dnde y cmo usarlos. Las farmacias generalmente imprimen las instrucciones del medicamento slo en las cajas y no directamente en los tubos del [place].   Si su medicamento es muy caro, por favor, pngase en contacto con  Rolm Gala llamando al [phone] y presione la opcin 4 o envenos un mensaje a travs de Clinical cytogeneticist.   No podemos decirle cul ser su copago por los medicamentos por adelantado ya que esto es diferente dependiendo de la cobertura de su seguro. Sin embargo, es posible que podamos encontrar un medicamento sustituto a Audiological scientist un formulario para que el seguro cubra el medicamento que se considera necesario.   Si se requiere una autorizacin previa para que su compaa de seguros [place] su medicamento, por favor permtanos de 1 a 2 das hbiles para completar [address]  Los precios de los medicamentos varan con frecuencia dependiendo del Environmental consultant de dnde se surte la receta y alguna farmacias pueden ofrecer precios ms baratos.  El sitio web www.goodrx.com tiene cupones para medicamentos de Health and safety inspector. Los precios aqu no tienen en cuenta lo que podra costar con la ayuda del seguro (puede ser ms barato con su seguro), pero el sitio web puede darle el precio si no  utiliz ningn seguro.  - Puede imprimir el cupn correspondiente y llevarlo con su receta a la farmacia.  - Tambin puede pasar por nuestra oficina durante el horario de atencin regular y Education officer, museum una tarjeta de cupones de GoodRx.  - Si necesita que su receta se enve electrnicamente a una farmacia diferente, informe a nuestra oficina a travs de MyChart de [facility] o por telfono llamando al [phone] y presione la opcin 4.

## 2022-11-26 ENCOUNTER — Telehealth: Payer: Self-pay | Admitting: Internal Medicine

## 2022-11-26 NOTE — Telephone Encounter (Signed)
Patient just called and wanted to know could she get some medication for her break out on her skin. She doesn't have her appointment yet til November. Her number is [phone]. If you have any questions.

## 2022-11-29 NOTE — Telephone Encounter (Signed)
LMTCB. Pt will need to schedule an appointment to have the rash looked at.

## 2022-11-30 ENCOUNTER — Ambulatory Visit (INDEPENDENT_AMBULATORY_CARE_PROVIDER_SITE_OTHER): Payer: Medicare Other | Admitting: Internal Medicine

## 2022-11-30 ENCOUNTER — Encounter: Payer: Self-pay | Admitting: Internal Medicine

## 2022-11-30 VITALS — BP 134/65 | Ht 64.0 in | Wt 180.0 lb

## 2022-11-30 DIAGNOSIS — L281 Prurigo nodularis: Secondary | ICD-10-CM

## 2022-11-30 DIAGNOSIS — R21 Rash and other nonspecific skin eruption: Secondary | ICD-10-CM

## 2022-11-30 DIAGNOSIS — K2970 Gastritis, unspecified, without bleeding: Secondary | ICD-10-CM | POA: Diagnosis not present

## 2022-11-30 NOTE — Progress Notes (Signed)
Virtual Visit via Caregility   Note   This format is felt to be most appropriate for this patient at this time.  All issues noted in this document were discussed and addressed.  No physical exam was performed (except for noted visual exam findings with Video Visits).   I connected with Jamie Anderson  on 11/30/22 at  2:00 PM EDT by telephone and verified that I am speaking with the correct person using two identifiers. Location patient: home Location provider: work or home office Persons participating in the virtual visit: patient, provider  I discussed the limitations, risks, security and privacy concerns of performing an evaluation and management service by telephone and the availability of in person appointments. I also discussed with the patient that there may be a patient responsible charge related to this service. The patient expressed understanding and agreed to proceed.  Interactive audio and video telecommunications were attempted between this provider and patient, however failed, due to patient having technical difficulties OR patient did not have access to video capability.  We continued and completed visit with audio only   Reason for visit: possible statin reaction   HPI:  Patient was prescribed rosuvastatin in May, had been taking simvastatin until aortic atherosclerosis was noted on recent imaging.  Developed a generalized rash covering truck,  neck and extremities  several months ago,  had a biopsy    However she has a H/o prurigo nodularis Per review of dermatology Delice Bison Stewart's note from April 2024)" She has lots of itching and nodules on her arms and legs. She picks at.  She has a history of dermatitis and has tried clobetasol cream, Eucrisa ointment, and fluocinonide cream. She also has h/o GA.   Receiving dupixent .  Rosuvastatin stopped, but simvastatin started by patient out of fear.     Reviewed her recent ER visit for gastritis  in August.  Given sucralfate  ,taking it 2-3  times daily .taking it on an empty stomach  .  Taking  pantoprazole later in the day, no loneger  having abd pain.  .  Given gabapentin but tid use made her dizzy so she is taking twice daily fpr management of sciatica.she does not notice any chang ein her pain   ROS: See pertinent positives and negatives per HPI.  Past Medical History:  Diagnosis Date   Acute deep vein thrombosis (DVT) of calf muscle vein of right lower extremity (HCC) 05/04/2021   Arthritis    Asthma    Basal cell carcinoma 01/17/2018   Left chest. Infiltrative. Excised 02/06/2018, margins free.   Basal cell carcinoma 12/14/2021   L nasal dorsum, Mohs [facility] 12/29/2021   Cystic teratoma of right ovary 11/23/2012   She underwent right salpingo-oophorectomy on April 1 by Dr. Tiburcio Pea for removal of what turned out to be a cystic teratoma. Hysteroscopy was also done at the time due to postmenopausal bleeding. It is unclear  why the left ovary was not taken out since she is postmenopausal.    Diastolic dysfunction    a. 09/2021 Echo: EF 55-60%, no rwma, GrII DD, nl RV fxn, mild MR, triv AI.   Dyspnea    GERD (gastroesophageal reflux disease)    Granuloma annulare    Hyperlipidemia    Hypertension    Menopause    Non-obstructive CAD (coronary artery disease)    a. 07/2021 Cor CTA: LM <25, LAD 25-49p, LCX <25p, RCA nl. Ca2+ = 390 (71st%'ile).   PAF (paroxysmal atrial fibrillation) (HCC)  a. 04/2021 - occured following R knee surgery.  CHA2DS2VASc = 5--> Eliquis.   Plantar fasciitis of right foot 12/24/2014   Right knee DJD 04/03/2012   SCC (squamous cell carcinoma) 10/08/2020   R mid lower pretibia, [facility]   SCC (squamous cell carcinoma) 03/16/2021   R upper pretibial, [facility] 12/14/2021   Squamous cell carcinoma of skin 02/26/2020   R lower pretibial, clear with bx 04/01/20   Squamous cell carcinoma of skin 07/15/2020   R mid pretibia, [facility]   Squamous cell carcinoma of skin 12/09/2020   R lateral pretibia, [facility]   Squamous cell  carcinoma of skin 12/14/2021   R thenar hand dorsum, [facility]. Also Mohs 12/29/2021.   Squamous cell carcinoma of skin 11/17/2022   left mid pretibia, [facility]    Past Surgical History:  Procedure Laterality Date   APPENDECTOMY     CATARACT EXTRACTION, BILATERAL     DILATION AND CURETTAGE OF UTERUS     RIGHT OOPHORECTOMY Right 04/08/2012   [name]   SPINE SURGERY  03/08/2008   TOTAL KNEE ARTHROPLASTY Right 04/29/2021   Procedure: TOTAL KNEE ARTHROPLASTY;  Surgeon: Lyndle Herrlich, MD;  Location: [facility] ORS;  Service: Orthopedics;  Laterality: Right;   VENOUS ABLATION     bilateral    Family History  Problem Relation Age of Onset   Early death Mother        durig childbirth   Heart disease Father    Heart attack Father    Breast cancer Sister 17       half sister   Heart disease Sister    Cancer Brother    Heart disease Brother        Afib    SOCIAL HX:  reports that she has never smoked. She has never used smokeless tobacco. She reports that she does not drink alcohol and does not use drugs.    Current Outpatient Medications:    acetaminophen (TYLENOL) 325 MG tablet, Take 1-2 tablets (325-650 mg total) by mouth every 6 (six) hours as needed for mild pain (pain score 1-3 or temp > 100.5)., Disp: 40 tablet, Rfl: 1   albuterol (PROAIR HFA) 108 (90 Base) MCG/ACT inhaler, Inhale 1-2 puffs into the lungs every 6 (six) hours as needed for wheezing or shortness of breath., Disp: 3 each, Rfl: 0   apixaban (ELIQUIS) 5 MG TABS tablet, Take 1 tablet (5 mg total) by mouth 2 (two) times daily., Disp: 60 tablet, Rfl: 5   calcium carbonate (TUMS - DOSED IN MG ELEMENTAL CALCIUM) 500 MG chewable tablet, Chew 1 tablet by mouth daily as needed for indigestion or heartburn., Disp: , Rfl:    cetirizine (ZYRTEC) 10 MG tablet, Take 10 mg by mouth daily as needed for allergies., Disp: , Rfl:    clobetasol (TEMOVATE) 0.05 % external solution, Mix into CeraVe cream as instructed (pt has written instructions) apply  to rash BID PRN. Avoid applying to face, groin, and axilla. Use as directed. Long-term use can cause thinning of the skin., Disp: 50 mL, Rfl: 1   conjugated estrogens (PREMARIN) vaginal cream, Place 1 applicator vaginally as needed., Disp: , Rfl:    fluocinonide cream (LIDEX) 0.05 %, Apply 1 application. topically 2 (two) times daily as needed (itching)., Disp: , Rfl:    fluticasone-salmeterol (ADVAIR) 100-50 MCG/ACT AEPB, Inhale 1 puff into the lungs 2 (two) times daily as needed., Disp: 180 each, Rfl: 1   gabapentin (NEURONTIN) 100 MG capsule, Take 1 capsule (100 mg total) by  mouth 3 (three) times daily., Disp: 90 capsule, Rfl: 2   hydrochlorothiazide (HYDRODIURIL) 25 MG tablet, Take 1 tablet (25 mg total) by mouth daily., Disp: 90 tablet, Rfl: 3   losartan (COZAAR) 100 MG tablet, TAKE 1 TABLET BY MOUTH DAILY, Disp: 90 tablet, Rfl: 1   Meclizine HCl 25 MG CHEW, Chew 1 tablet by mouth 2 (two) times daily., Disp: , Rfl:    pantoprazole (PROTONIX) 40 MG tablet, Take 1 tablet (40 mg total) by mouth daily., Disp: 90 tablet, Rfl: 3   sertraline (ZOLOFT) 100 MG tablet, TAKE 1 TABLET BY MOUTH DAILY, Disp: 90 tablet, Rfl: 3   sucralfate (CARAFATE) 1 g tablet, Take 1 tablet (1 g total) by mouth 4 (four) times daily., Disp: 120 tablet, Rfl: 1   tacrolimus (PROTOPIC) 0.1 % ointment, Apply to arms/legs at night for itching., Disp: 100 g, Rfl: 1   VITAMIN D PO, Take 1,000 Int'l Units/day by mouth daily., Disp: , Rfl:    allopurinol (ZYLOPRIM) 100 MG tablet, TAKE 1 TABLET BY MOUTH  DAILY (Patient not taking: Reported on 11/30/2022), Disp: 90 tablet, Rfl: 3   amLODipine (NORVASC) 2.5 MG tablet, Take 1 tablet (2.5 mg total) by mouth daily. (Patient not taking: Reported on 11/30/2022), Disp: 30 tablet, Rfl: 2   calcitRIOL (ROCALTROL) 0.25 MCG capsule, Take 0.25 mcg by mouth daily. (Patient not taking: Reported on 11/30/2022), Disp: , Rfl:   Current Facility-Administered Medications:    tralokinumab SOSY 300 mg, 300  mg, Subcutaneous, Q14 Days, Elie Goody, MD, 300 mg at 11/04/22 1643  EXAM:   General impression: alert, cooperative and articulate.  No signs of being in distress  Lungs: speech is fluent sentence length suggests that patient is not short of breath and not punctuated by cough, sneezing or sniffing. Marland Kitchen   Psych: affect normal.  speech is articulate and non pressured .  Denies suicidal thoughts    ASSESSMENT AND PLAN: Prurigo nodularis Assessment & Plan: Chronic, managed with Dupixent    Rash and nonspecific skin eruption Assessment & Plan: Recommend suspending all statinas and allopurinol.    Gastritis without bleeding, unspecified chronicity, unspecified gastritis type Assessment & Plan: Advised to DC sucralfate and continue pantoprazole        I discussed the assessment and treatment plan with the patient. The patient was provided an opportunity to ask questions and all were answered. The patient agreed with the plan and demonstrated an understanding of the instructions.   The patient was advised to call back or seek an in-person evaluation if the symptoms worsen or if the condition fails to improve as anticipated.   I spent 25 minutes dedicated to the care of this patient on the date of this telephone encounter to include pre-visit review of her medical history, last dermatology evaluation,  , review of ER visit,   , and post visit ordering of testing and therapeutics.    Sherlene Shams, MD

## 2022-11-30 NOTE — Telephone Encounter (Signed)
The patient returned your call. I do not have an available appointment for the patient. She does not want to see another provider. She is requesting a call back from the provider to discuss her rash and being taken off her statin.

## 2022-11-30 NOTE — Assessment & Plan Note (Signed)
Advised to DC sucralfate and continue pantoprazole

## 2022-11-30 NOTE — Patient Instructions (Addendum)
Stop the sucralfate  and stop   the gabapentin   Continue pantoprazole (protonix) in the morning.  Take it   as soon as you wake up 1 hour before breakfast  No more statins (simvastatin and rosuvastatin)   Do not resume allopurinol  unless you have a gout flare  BUT  call the office if you get a gout flare so I can treat it   Keep the appointment with the allergist

## 2022-11-30 NOTE — Telephone Encounter (Signed)
Pt is scheduled for today at 2 pm .

## 2022-11-30 NOTE — Assessment & Plan Note (Signed)
Chronic, managed with Dupixent

## 2022-11-30 NOTE — Assessment & Plan Note (Signed)
Recommend suspending all statinas and allopurinol.

## 2022-12-07 DIAGNOSIS — L299 Pruritus, unspecified: Secondary | ICD-10-CM | POA: Diagnosis not present

## 2022-12-07 DIAGNOSIS — R21 Rash and other nonspecific skin eruption: Secondary | ICD-10-CM | POA: Diagnosis not present

## 2022-12-08 ENCOUNTER — Other Ambulatory Visit: Payer: Self-pay | Admitting: Internal Medicine

## 2022-12-08 ENCOUNTER — Other Ambulatory Visit: Payer: Self-pay | Admitting: Cardiovascular Disease

## 2022-12-08 DIAGNOSIS — E785 Hyperlipidemia, unspecified: Secondary | ICD-10-CM

## 2022-12-08 NOTE — Telephone Encounter (Signed)
Pt scheduled on 10/9

## 2022-12-08 NOTE — Telephone Encounter (Signed)
Good Morning,  Could you please schedule this patient a 12 month follow up visit? The patient was last seen by Dr. Mariah Milling on 12-22-2021? Thank you so much.

## 2022-12-09 NOTE — Telephone Encounter (Signed)
Zetia was discontinued on 07/18/2022. Is it okay to refuse?   Pantoprazole was last filled by a different provider. Is it okay to refill?

## 2022-12-11 ENCOUNTER — Other Ambulatory Visit: Payer: Self-pay | Admitting: Internal Medicine

## 2022-12-13 NOTE — Telephone Encounter (Signed)
Medication was discontinued on 07/19/2022. Is it okay to refuse?

## 2022-12-15 ENCOUNTER — Ambulatory Visit: Payer: Medicare Other | Attending: Cardiovascular Disease | Admitting: Cardiovascular Disease

## 2022-12-15 ENCOUNTER — Encounter: Payer: Self-pay | Admitting: Cardiovascular Disease

## 2022-12-15 VITALS — BP 132/74 | HR 74 | Ht 64.0 in | Wt 180.6 lb

## 2022-12-15 DIAGNOSIS — I480 Paroxysmal atrial fibrillation: Secondary | ICD-10-CM

## 2022-12-15 MED ORDER — LOSARTAN POTASSIUM 100 MG PO TABS: 100.0000 mg | ORAL_TABLET | Freq: Every day | ORAL | 3 refills | Status: DC

## 2022-12-15 MED ORDER — LOSARTAN POTASSIUM 100 MG PO TABS: 100.0000 mg | ORAL_TABLET | Freq: Every day | ORAL | Status: DC

## 2022-12-15 NOTE — Patient Instructions (Signed)
Medication Instructions:  Do a trial hold of the hydrochlorothiazide for 2 weeks See if rash gets better  If you need a refill on your cardiac medications before your next appointment, please call your pharmacy.   Lab work: No new labs needed  Testing/Procedures: No new testing needed  Follow-Up: At [facility], you and your health needs are our priority.  As part of our continuing mission to provide you with exceptional heart care, we have created designated Provider Care Teams.  These Care Teams include your primary Cardiologist (physician) and Advanced Practice Providers (APPs -  Physician Assistants and Nurse Practitioners) who all work together to provide you with the care you need, when you need it.  You will need a follow up appointment in 12 months  Providers on your designated Care Team:   Nicolasa Ducking, NP Eula Listen, PA-C Cadence Fransico Michael, [place]  COVID-19 Vaccine Information can be found at: [email or web address] For questions related to vaccine distribution or appointments, please email [email or web address] or call [phone].

## 2022-12-15 NOTE — Progress Notes (Signed)
Cardiology Office Note  Date:  12/15/2022   ID:  Jamie Anderson, DOB 11-08-1935, MRN [id number]  PCP:  Sherlene Shams, MD   Chief Complaint  Patient presents with   Follow-up    12 month follow up. Patient feels good. Medications reviewed verbally.    HPI:  [name] is a pleasant 87 year old woman with past medical history of Hypertension Hyperlipidemia Chronic back pain, hx of back surgery Aortic atherosclerosis, coronary calcification on CT Afib noted during her hospitalization in February 2023 following right knee surgery. rhythm strips from May 03, 2021, that do show rapid A-fib in the 130s History of DVT surgery.Who presents for follow-up of her chest pain ,  paroxysmal atrial fibrillation  Last clinic visit with myself 12/2021  In follow-up today reports she has had a difficult time over the past several months Shingles in 8/24 Bursitis, and her hip Diffuse rash arms/legs for the past several months Stomach trouble  Has had allergy testing for the rash, no conclusive etiology Several medications held including allopurinol, statins Itching is worse in hot bathtub water Sleeping okay without much itching  Denies tachypalpitations concerning for atrial fibrillation Denies chest pain concerning for angina  No significant leg swelling  EKG personally reviewed by myself on todays visit EKG Interpretation Date/Time:  Wednesday December 15 2022 16:29:21 EDT Ventricular Rate:  74 PR Interval:  182 QRS Duration:  74 QT Interval:  400 QTC Calculation: 444 R Axis:   11  Text Interpretation: Normal sinus rhythm Low voltage QRS When compared with ECG of 28-Oct-2022 17:56, No significant change was found Confirmed by Julien Nordmann [phone]) on 12/15/2022 4:44:36 PM   The past medical history reviewed  Recovered from knee surgery February 2023 Treated for right DVT on Eliquis Has remained on Eliquis given history of paroxysmal atrial fibrillation A-fib noted  on rhythm strips while she was in the hospital February 2023  Recent imaging reviewed with her in detail Echo 7/23 EF normal  CTA 5/23 which showed a 25 to 49% proximal LAD stenosis and less than 25% left main and LCX plaque.    Spent several weeks at [facility] Active, no regular exercise program Reports having some dizziness in the past Prior history of chronic back pain, has had cortisone shots   Lab work reviewed Total chol 163, LDL 68 On statin with Zetia   nonsmoker nondiabetetic  EKG personally reviewed by myself on todays visit EKG Interpretation Date/Time:  Wednesday December 15 2022 16:29:21 EDT Ventricular Rate:  74 PR Interval:  182 QRS Duration:  74 QT Interval:  400 QTC Calculation: 444 R Axis:   11  Text Interpretation: Normal sinus rhythm Low voltage QRS When compared with ECG of 28-Oct-2022 17:56, No significant change was found Confirmed by Julien Nordmann [phone]) on 12/15/2022 4:44:36 PM    PMH:   has a past medical history of Acute deep vein thrombosis (DVT) of calf muscle vein of right lower extremity (HCC) (05/04/2021), Arthritis, Asthma, Basal cell carcinoma (01/17/2018), Basal cell carcinoma (12/14/2021), Cystic teratoma of right ovary (11/23/2012), Diastolic dysfunction, Dyspnea, GERD (gastroesophageal reflux disease), Granuloma annulare, Hyperlipidemia, Hypertension, Menopause, Non-obstructive CAD (coronary artery disease), PAF (paroxysmal atrial fibrillation) (HCC), Plantar fasciitis of right foot (12/24/2014), Right knee DJD (04/03/2012), SCC (squamous cell carcinoma) (10/08/2020), SCC (squamous cell carcinoma) (03/16/2021), Squamous cell carcinoma of skin (02/26/2020), Squamous cell carcinoma of skin (07/15/2020), Squamous cell carcinoma of skin (12/09/2020), Squamous cell carcinoma of skin (12/14/2021), and Squamous cell carcinoma of skin (11/17/2022).  PSH:    Past Surgical History:  Procedure Laterality Date   APPENDECTOMY     CATARACT  EXTRACTION, BILATERAL     DILATION AND CURETTAGE OF UTERUS     RIGHT OOPHORECTOMY Right 04/08/2012   [name]   SPINE SURGERY  03/08/2008   TOTAL KNEE ARTHROPLASTY Right 04/29/2021   Procedure: TOTAL KNEE ARTHROPLASTY;  Surgeon: Lyndle Herrlich, MD;  Location: [facility] ORS;  Service: Orthopedics;  Laterality: Right;   VENOUS ABLATION     bilateral    Current Outpatient Medications  Medication Sig Dispense Refill   acetaminophen (TYLENOL) 325 MG tablet Take 1-2 tablets (325-650 mg total) by mouth every 6 (six) hours as needed for mild pain (pain score 1-3 or temp > 100.5). 40 tablet 1   apixaban (ELIQUIS) 5 MG TABS tablet Take 1 tablet (5 mg total) by mouth 2 (two) times daily. 60 tablet 5   calcitRIOL (ROCALTROL) 0.25 MCG capsule Take 0.25 mcg by mouth daily.     calcium carbonate (TUMS - DOSED IN MG ELEMENTAL CALCIUM) 500 MG chewable tablet Chew 1 tablet by mouth daily as needed for indigestion or heartburn.     cetirizine (ZYRTEC) 10 MG tablet Take 10 mg by mouth daily as needed for allergies.     clobetasol (TEMOVATE) 0.05 % external solution Mix into CeraVe cream as instructed (pt has written instructions) apply to rash BID PRN. Avoid applying to face, groin, and axilla. Use as directed. Long-term use can cause thinning of the skin. 50 mL 1   conjugated estrogens (PREMARIN) vaginal cream Place 1 applicator vaginally as needed.     ezetimibe (ZETIA) 10 MG tablet TAKE 1 TABLET BY MOUTH DAILY 100 tablet 3   fluocinolone (SYNALAR) 0.025 % cream 1 application Externally Twice a day     fluocinonide cream (LIDEX) 0.05 % Apply 1 application. topically 2 (two) times daily as needed (itching).     fluticasone-salmeterol (ADVAIR) 100-50 MCG/ACT AEPB Inhale 1 puff into the lungs 2 (two) times daily as needed. 180 each 1   gabapentin (NEURONTIN) 100 MG capsule Take 1 capsule (100 mg total) by mouth 3 (three) times daily. 90 capsule 2   hydrochlorothiazide (HYDRODIURIL) 25 MG tablet TAKE 1 TABLET BY MOUTH  DAILY 90 tablet 3   losartan (COZAAR) 100 MG tablet Take 1 tablet (100 mg total) by mouth daily. PLEASE KEEP UPCOMING APPT (10-09) FOR CONTINUATION OF THERAPY. (FIRST ATTEMPT) 30 tablet 0   Meclizine HCl 25 MG CHEW Chew 1 tablet by mouth 2 (two) times daily.     pantoprazole (PROTONIX) 40 MG tablet TAKE 1 TABLET BY MOUTH DAILY 100 tablet 3   sertraline (ZOLOFT) 100 MG tablet TAKE 1 TABLET BY MOUTH DAILY 90 tablet 3   sucralfate (CARAFATE) 1 g tablet Take 1 tablet (1 g total) by mouth 4 (four) times daily. 120 tablet 1   tacrolimus (PROTOPIC) 0.1 % ointment Apply to arms/legs at night for itching. 100 g 1   VITAMIN D PO Take 1,000 Int'l Units/day by mouth daily.     Current Facility-Administered Medications  Medication Dose Route Frequency Provider Last Rate Last Admin   tralokinumab SOSY 300 mg  300 mg Subcutaneous Q14 Days Elie Goody, MD   300 mg at 11/04/22 1643     Allergies:   Almond mea, Amoxicillin-pot clavulanate, Calcitriol, Clarithromycin, Colloidal oatmeal, Dust mite extract, Gluten meal, Molds & smuts, Oat, Other, and Shrimp [shellfish allergy]   Social History:  The patient  reports that she has never  smoked. She has never used smokeless tobacco. She reports that she does not drink alcohol and does not use drugs.   Family History:   family history includes Breast cancer (age of onset: 69) in her sister; Cancer in her brother; Early death in her mother; Heart attack in her father; Heart disease in her brother, father, and sister.    Review of Systems: Review of Systems  Constitutional: Negative.   Eyes: Negative.   Respiratory: Negative.    Cardiovascular: Negative.   Gastrointestinal: Negative.   Genitourinary: Negative.   Musculoskeletal:  Positive for back pain and neck pain.  Neurological: Negative.   Psychiatric/Behavioral: Negative.    All other systems reviewed and are negative.   PHYSICAL EXAM: VS:  BP 132/74 (BP Location: Left Arm, Patient Position:  Sitting, Cuff Size: Normal)   Pulse 74   Ht 5\' 4"  (1.626 m)   Wt 180 lb 9.6 oz (81.9 kg)   SpO2 94%   BMI 31.00 kg/m  , BMI Body mass index is 31 kg/m. Constitutional:  oriented to person, place, and time. No distress.  HENT:  Head: Grossly normal Eyes:  no discharge. No scleral icterus.  Neck: No JVD, no carotid bruits  Cardiovascular: Regular rate and rhythm, no murmurs appreciated Pulmonary/Chest: Clear to auscultation bilaterally, no wheezes or rails Abdominal: Soft.  no distension.  no tenderness.  Musculoskeletal: Normal range of motion Neurological:  normal muscle tone. Coordination normal. No atrophy Skin: Rash on arms and legs, back Psychiatric: normal affect, pleasant   Recent Labs: 07/19/2022: TSH 1.32 10/28/2022: ALT 21; BUN 19; Creatinine, Ser 0.98; Hemoglobin 11.6; Platelets 238; Potassium 3.4; Sodium 134    Lipid Panel Lab Results  Component Value Date   CHOL 159 07/19/2022   HDL 77.50 07/19/2022   LDLCALC 68 07/19/2022   TRIG 68.0 07/19/2022    Wt Readings from Last 3 Encounters:  12/15/22 180 lb 9.6 oz (81.9 kg)  11/30/22 180 lb (81.6 kg)  10/28/22 180 lb (81.6 kg)     ASSESSMENT AND PLAN:  Atypical chest pain  Currently with no symptoms of angina. No further workup at this time. Continue current medication regimen.  Aortic atherosclerosis (HCC)  Moderate aortic atherosclerosis Reports statin held in the setting of recent rash  Essential hypertension Blood pressure is well controlled on today's visit.  Recommend she do a trial hold off HCTZ for 2 weeks to confirm she does not have sulfa allergy If she has improving rash, alternate medication could be used  Hyperlipidemia Continue statin with Zetia Numbers at goal  Shortness of breath Low carbohydrate diet, walking program recommended  Paroxysmal atrial fibrillation Noted perioperatively on telemetry strips in the hospital February 2023 on Eliquis.  Consider beta-blocker and  follow-up  Total encounter time more than 30 minutes Greater than 50% was spent in counseling and coordination of care with the patient   Signed, Dossie Arbour, M.D., Ph.D. 12/15/2022  [facility] [place], [place] [phone]

## 2022-12-20 ENCOUNTER — Telehealth: Payer: Self-pay

## 2022-12-20 NOTE — Telephone Encounter (Signed)
[place] allergy and asthma progress notes in media. aw

## 2022-12-30 ENCOUNTER — Ambulatory Visit: Payer: Medicare Other | Admitting: Internal Medicine

## 2022-12-30 ENCOUNTER — Encounter: Payer: Self-pay | Admitting: Internal Medicine

## 2022-12-30 VITALS — BP 120/70 | HR 79 | Temp 97.8°F | Ht 64.0 in | Wt 183.0 lb

## 2022-12-30 DIAGNOSIS — D511 Vitamin B12 deficiency anemia due to selective vitamin B12 malabsorption with proteinuria: Secondary | ICD-10-CM

## 2022-12-30 DIAGNOSIS — J42 Unspecified chronic bronchitis: Secondary | ICD-10-CM

## 2022-12-30 DIAGNOSIS — E538 Deficiency of other specified B group vitamins: Secondary | ICD-10-CM | POA: Diagnosis not present

## 2022-12-30 DIAGNOSIS — N1831 Chronic kidney disease, stage 3a: Secondary | ICD-10-CM | POA: Diagnosis not present

## 2022-12-30 DIAGNOSIS — L281 Prurigo nodularis: Secondary | ICD-10-CM

## 2022-12-30 DIAGNOSIS — E876 Hypokalemia: Secondary | ICD-10-CM

## 2022-12-30 DIAGNOSIS — I10 Essential (primary) hypertension: Secondary | ICD-10-CM

## 2022-12-30 DIAGNOSIS — E785 Hyperlipidemia, unspecified: Secondary | ICD-10-CM | POA: Diagnosis not present

## 2022-12-30 DIAGNOSIS — D509 Iron deficiency anemia, unspecified: Secondary | ICD-10-CM | POA: Diagnosis not present

## 2022-12-30 DIAGNOSIS — J479 Bronchiectasis, uncomplicated: Secondary | ICD-10-CM

## 2022-12-30 LAB — COMPREHENSIVE METABOLIC PANEL
ALT: 10 U/L (ref 0–35)
AST: 16 U/L (ref 0–37)
Albumin: 4 g/dL (ref 3.5–5.2)
Alkaline Phosphatase: 109 U/L (ref 39–117)
BUN: 18 mg/dL (ref 6–23)
CO2: 28 meq/L (ref 19–32)
Calcium: 9.6 mg/dL (ref 8.4–10.5)
Chloride: 105 meq/L (ref 96–112)
Creatinine, Ser: 1.16 mg/dL (ref 0.40–1.20)
GFR: 43.09 mL/min — ABNORMAL LOW (ref >60.00)
Glucose, Bld: 95 mg/dL (ref 70–99)
Potassium: 4.4 meq/L (ref 3.5–5.1)
Sodium: 138 meq/L (ref 135–145)
Total Bilirubin: 0.4 mg/dL (ref 0.2–1.2)
Total Protein: 6.5 g/dL (ref 6.0–8.3)

## 2022-12-30 LAB — B12 AND FOLATE PANEL
Folate: 9.7 ng/mL (ref >5.9)
Vitamin B-12: 699 pg/mL (ref 211–911)

## 2022-12-30 LAB — VITAMIN D 25 HYDROXY (VIT D DEFICIENCY, FRACTURES): VITD: 22.45 ng/mL — ABNORMAL LOW (ref 30.00–100.00)

## 2022-12-30 LAB — LIPID PANEL
Cholesterol: 234 mg/dL — ABNORMAL HIGH (ref 0–200)
HDL: 71.4 mg/dL (ref >39.00)
LDL Cholesterol: 143 mg/dL — ABNORMAL HIGH (ref 0–99)
NonHDL: 162.28
Total CHOL/HDL Ratio: 3
Triglycerides: 95 mg/dL (ref 0.0–149.0)
VLDL: 19 mg/dL (ref 0.0–40.0)

## 2022-12-30 LAB — IBC + FERRITIN
Ferritin: 7.6 ng/mL — ABNORMAL LOW (ref 10.0–291.0)
Iron: 31 ug/dL — ABNORMAL LOW (ref 42–145)
Saturation Ratios: 8.4 % — ABNORMAL LOW (ref 20.0–50.0)
TIBC: 368.2 ug/dL (ref 250.0–450.0)
Transferrin: 263 mg/dL (ref 212.0–360.0)

## 2022-12-30 LAB — MAGNESIUM: Magnesium: 1.9 mg/dL (ref 1.5–2.5)

## 2022-12-30 LAB — LDL CHOLESTEROL, DIRECT: Direct LDL: 133 mg/dL

## 2022-12-30 MED ORDER — FEXOFENADINE HCL 180 MG PO TABS: 180.0000 mg | ORAL_TABLET | Freq: Two times a day (BID) | ORAL | 2 refills | Status: DC

## 2022-12-30 MED ORDER — LOSARTAN POTASSIUM 100 MG PO TABS: 100.0000 mg | ORAL_TABLET | Freq: Every day | ORAL | Status: DC

## 2022-12-30 MED ORDER — FLUTICASONE-SALMETEROL 100-50 MCG/ACT IN AEPB: 1.0000 | INHALATION_SPRAY | Freq: Two times a day (BID) | RESPIRATORY_TRACT | 1 refills | Status: DC | PRN

## 2022-12-30 MED ORDER — PANTOPRAZOLE SODIUM 40 MG PO TBEC: 40.0000 mg | DELAYED_RELEASE_TABLET | Freq: Every day | ORAL | 3 refills | Status: DC

## 2022-12-30 MED ORDER — FAMOTIDINE 20 MG PO TABS
20.0000 mg | ORAL_TABLET | Freq: Two times a day (BID) | ORAL | 2 refills | Status: DC
Start: 2022-12-30 — End: 2023-12-02

## 2022-12-30 NOTE — Patient Instructions (Addendum)
Your itching is going to take multiple medications to congtrol  You need to add generic allegra (fexofenadine 180 mg  )  two times daily AND CONTINUE GENERIC ZYXAL at bedtime.    allegra is less sedating than zyrtec so that is why I am prescribing it   Add famotidine (generic for Pepcid)  20 mg every 12 hours to help with the itching  you can take with allegra   Continue the steroid cream from Dr Radene Gunning can resume crestor but do not resume allopurinol or simvastatin   Resume advair to prevent bronchitis  You can take 2  tums daily for calcium   Check to see if you are taking gabapentin,  you told me last month that you were taking it twice daily,  because 3 times daily made you dizzy

## 2022-12-30 NOTE — Progress Notes (Addendum)
Subjective:  Patient ID: Jamie Anderson, female    DOB: [birth year]  Age: 87 y.o. MRN: [id number]  CC: The primary encounter diagnosis was Essential hypertension. 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   HPI Jamie Anderson presents for  Chief Complaint  Patient presents with   Medical Management of Chronic Issues    Discuss medications     One month follow up on gastritis and medication induced rash. Vs purigo nodularis   Sucralfate,  statin and allopurinol were stopped . She states that she was tested for allergies  BY [facility] and "I' m not allergic to anything"  Ordered by Willeen Niece . Urticaria    per OV note    1) HTN: Hypertension: patient checks blood pressure twice weekly at home.  Readings have been for the most part <130/80 at rest . Patient is following a reduced salt diet most days and is taking medications as prescribed   2) PAF: no recent episodes . Taking Eliquis   3) CKD: avoiding orla NSAIDS.   4) anemia : ron deficiency  ,  recurrent.   she is taking iron,      Outpatient Medications Prior to Visit  Medication Sig Dispense Refill   apixaban (ELIQUIS) 5 MG TABS tablet Take 1 tablet (5 mg total) by mouth 2 (two) times daily. 60 tablet 5   levocetirizine (XYZAL) 5 MG tablet Take 5 mg by mouth daily.     sertraline (ZOLOFT) 100 MG tablet TAKE 1 TABLET BY MOUTH DAILY 90 tablet 3   VITAMIN D PO Take 1,000 Int'l Units/day by mouth daily.     losartan (COZAAR) 100 MG tablet Take 1 tablet (100 mg total) by mouth daily.     pantoprazole (PROTONIX) 40 MG tablet TAKE 1 TABLET BY MOUTH DAILY 100 tablet 3    acetaminophen (TYLENOL) 325 MG tablet Take 1-2 tablets (325-650 mg total) by mouth every 6 (six) hours as needed for mild pain (pain score 1-3 or temp > 100.5). 40 tablet 1   calcitRIOL (ROCALTROL) 0.25 MCG capsule Take 0.25 mcg by mouth daily. (Patient not taking: Reported on 12/30/2022)     calcium carbonate (TUMS - DOSED IN MG ELEMENTAL CALCIUM) 500 MG chewable tablet Chew 1 tablet by mouth daily as needed for indigestion or heartburn. (Patient not taking: Reported on 12/30/2022)     cetirizine (ZYRTEC) 10 MG tablet Take 10 mg by mouth daily as needed for allergies. (Patient not taking: Reported on 12/30/2022)     clobetasol (TEMOVATE) 0.05 % external solution Mix into CeraVe cream as instructed (pt has written instructions) apply to rash BID PRN. Avoid applying to face, groin, and axilla. Use as directed. Long-term use can cause thinning of the skin. (Patient not taking: Reported on 12/30/2022) 50 mL 1   conjugated estrogens (PREMARIN) vaginal cream Place 1 applicator vaginally as needed. (Patient not taking: Reported on 12/30/2022)     ezetimibe (ZETIA) 10 MG tablet TAKE 1 TABLET BY MOUTH DAILY (Patient not taking: Reported on 12/30/2022) 100 tablet 3   fluocinolone (SYNALAR) 0.025 % cream 1 application Externally Twice a day (Patient not taking: Reported on 12/30/2022)     fluocinonide cream (LIDEX) 0.05 % Apply  1 application. topically 2 (two) times daily as needed (itching). (Patient not taking: Reported on 12/30/2022)     fluticasone-salmeterol (ADVAIR) 100-50 MCG/ACT AEPB Inhale 1 puff into the lungs 2 (two) times daily as needed. (Patient not taking: Reported on 12/30/2022) 180 each 1   gabapentin (NEURONTIN) 100 MG capsule Take 1 capsule (100 mg total) by mouth 3 (three) times daily. (Patient not taking: Reported on 12/30/2022) 90 capsule 2   hydrochlorothiazide (HYDRODIURIL) 25 MG tablet TAKE 1 TABLET BY MOUTH DAILY (Patient not taking: Reported on 12/30/2022) 90 tablet 3   Meclizine HCl 25  MG CHEW Chew 1 tablet by mouth 2 (two) times daily. (Patient not taking: Reported on 12/30/2022)     metoprolol tartrate (LOPRESSOR) 25 MG tablet Take 25 mg by mouth. (Patient not taking: Reported on 01/19/2023)     sucralfate (CARAFATE) 1 g tablet Take 1 tablet (1 g total) by mouth 4 (four) times daily. 120 tablet 1   tacrolimus (PROTOPIC) 0.1 % ointment Apply to arms/legs at night for itching. (Patient not taking: Reported on 12/30/2022) 100 g 1   tralokinumab SOSY 300 mg      No facility-administered medications prior to visit.    Review of Systems;  Patient denies headache, fevers, malaise, unintentional weight loss,  eye pain, sinus congestion and sinus pain, sore throat, dysphagia,  hemoptysis , cough, dyspnea, wheezing, chest pain, palpitations, orthopnea, edema, abdominal pain, nausea, melena, diarrhea, constipation, flank pain, dysuria, hematuria, urinary  Frequency, nocturia, numbness, tingling, seizures,  Focal weakness, Loss of consciousness,  Tremor, insomnia, depression, and suicidal ideation.      Objective:  BP 120/70   Pulse 79   Temp 97.8 F (36.6 C) (Oral)   Ht 5\' 4"  (1.626 m)   Wt 183 lb (83 kg)   SpO2 98%   BMI 31.41 kg/m   BP Readings from Last 3 Encounters:  01/19/23 (!) 158/80  12/30/22 120/70  12/15/22 132/74    Wt Readings from Last 3 Encounters:  01/19/23 185 lb 12.8 oz (84.3 kg)  12/30/22 183 lb (83 kg)  12/15/22 180 lb 9.6 oz (81.9 kg)    Physical Exam Vitals reviewed.  Constitutional:      General: She is not in acute distress.    Appearance: Normal appearance. She is normal weight. She is not ill-appearing, toxic-appearing or diaphoretic.  HENT:     Head: Normocephalic.  Eyes:     General: No scleral icterus.       Right eye: No discharge.        Left eye: No discharge.     Conjunctiva/sclera: Conjunctivae normal.  Cardiovascular:     Rate and Rhythm: Normal rate and regular rhythm.     Heart sounds: Normal heart sounds.  Pulmonary:      Effort: Pulmonary effort is normal. No respiratory distress.     Breath sounds: Normal breath sounds.  Musculoskeletal:        General: Normal range of motion.  Skin:    General: Skin is warm and dry.     Findings: Rash present. Rash is nodular, papular and purpuric.     Comments: Diffuse rash covering arm and legs   Neurological:     General: No focal deficit present.     Mental Status: She is alert and oriented to person, place, and time. Mental status is at baseline.  Psychiatric:        Mood and Affect: Mood normal.        Behavior: Behavior  normal.        Thought Content: Thought content normal.        Judgment: Judgment normal.    Lab Results  Component Value Date   HGBA1C 5.6 07/19/2022   HGBA1C 5.5 01/30/2018   HGBA1C 5.4 01/15/2015    Lab Results  Component Value Date   CREATININE 1.16 12/30/2022   CREATININE 0.98 10/28/2022   CREATININE 1.24 (H) 07/27/2022    Lab Results  Component Value Date   WBC 5.6 10/28/2022   HGB 11.6 (L) 10/28/2022   HCT 34.6 (L) 10/28/2022   PLT 238 10/28/2022   GLUCOSE 95 12/30/2022   CHOL 234 (H) 12/30/2022   TRIG 95.0 12/30/2022   HDL 71.40 12/30/2022   LDLDIRECT 133.0 12/30/2022   LDLCALC 143 (H) 12/30/2022   ALT 10 12/30/2022   AST 16 12/30/2022   NA 138 12/30/2022   K 4.4 12/30/2022   CL 105 12/30/2022   CREATININE 1.16 12/30/2022   BUN 18 12/30/2022   CO2 28 12/30/2022   TSH 1.32 07/19/2022   INR 0.9 05/29/2012   HGBA1C 5.6 07/19/2022   MICROALBUR 1.5 09/27/2022    CT ABDOMEN PELVIS W CONTRAST  Result Date: 10/28/2022 CLINICAL DATA:  Right lower quadrant pain EXAM: CT ABDOMEN AND PELVIS WITH CONTRAST TECHNIQUE: Multidetector CT imaging of the abdomen and pelvis was performed using the standard protocol following bolus administration of intravenous contrast. RADIATION DOSE REDUCTION: This exam was performed according to the departmental dose-optimization program which includes automated exposure control,  adjustment of the mA and/or kV according to patient size and/or use of iterative reconstruction technique. CONTRAST:  OMNIPAQUE IOHEXOL 300 MG/ML  SOLN COMPARISON:  CT 02/06/2014 FINDINGS: Lower chest: Lung bases demonstrate no acute airspace disease. Hepatobiliary: No focal liver abnormality is seen. No gallstones, gallbladder wall thickening, or biliary dilatation. Pancreas: Unremarkable. No pancreatic ductal dilatation or surrounding inflammatory changes. Spleen: Normal in size without focal abnormality. Adrenals/Urinary Tract: Left adrenal gland is normal. Stable 11 mm right adrenal nodule consistent with adenoma, no imaging follow-up is recommended. Kidneys show no hydronephrosis. Cysts and subcentimeter hypodensities in the kidneys too small to further characterize, no specific imaging follow-up is recommended. Bladder is normal Stomach/Bowel: Stomach nonenlarged. No dilated small bowel. No acute bowel wall thickening. Nonvisualized appendix but no right lower quadrant inflammation. Possible pill fragment within right lower quadrant small bowel. Vascular/Lymphatic: Moderate aortic atherosclerosis. No aneurysm. No suspicious lymph nodes. Reproductive: Calcified uterine fibroids.  No adnexal mass Other: Negative for free air or free fluid. Musculoskeletal: Scoliosis and degenerative changes of the spine. No acute osseous abnormality IMPRESSION: 1. No CT evidence for acute intra-abdominal or pelvic abnormality. 2. Nonvisualized appendix but no right lower quadrant inflammation. 3. Calcified uterine fibroids. 4. Aortic atherosclerosis. Aortic Atherosclerosis (ICD10-I70.0). Electronically Signed   By: Jasmine Pang M.D.   On: 10/28/2022 17:17    Assessment & Plan:  .Essential hypertension -     Comprehensive metabolic panel  Hyperlipidemia LDL goal <70 -     Lipid panel -     LDL cholesterol, direct  Iron deficiency anemia, unspecified iron deficiency anemia type Assessment & Plan: Occurring post  operatively from TKR in Feb 2023. Iron stores have fallen. Resume iron   Lab Results  Component Value Date   IRON 31 (L) 12/30/2022   TIBC 368.2 12/30/2022   FERRITIN 7.6 (L) 12/30/2022   Lab Results  Component Value Date   WBC 5.6 10/28/2022   HGB 11.6 (L) 10/28/2022   HCT  34.6 (L) 10/28/2022   MCV 83.8 10/28/2022   PLT 238 10/28/2022     Orders: -     IBC + Ferritin  B12 deficiency -     B12 and Folate Panel  Vit B12 defic anemia d/t slctv vit B12 malabsorp w protein -     VITAMIN D 25 Hydroxy (Vit-D Deficiency, Fractures)  Hypokalemia -     Comprehensive metabolic panel -     Magnesium  Chronic bronchitis, unspecified chronic bronchitis type (HCC) Assessment & Plan: Advised to resume LABA /steroid MDI.  Advai rx written   Bronchiectasis without complication (HCC) Assessment & Plan: Mild bronchiectasis and chronic bronchitis.  Patient has stopped using Advair scheduled and hass become symptomatic    Stage 3a chronic kidney disease (HCC) Assessment & Plan: Her GFR Is stable and she has been avioding NSAIDS ,  Follow up with nephrology as planned, every 6 months   Lab Results  Component Value Date   CREATININE 1.16 12/30/2022      Prurigo nodularis Assessment & Plan: With chronic urticaria.Marland Kitchen advised her to be more consistent with use of antihistamines    Other orders -     Fluticasone-Salmeterol; Inhale 1 puff into the lungs 2 (two) times daily as needed.  Dispense: 180 each; Refill: 1 -     Losartan Potassium; Take 1 tablet (100 mg total) by mouth daily. -     Pantoprazole Sodium; Take 1 tablet (40 mg total) by mouth daily. For gastritis  Dispense: 100 tablet; Refill: 3 -     Fexofenadine HCl; Take 1 tablet (180 mg total) by mouth 2 (two) times daily. For itching  Dispense: 180 tablet; Refill: 2 -     Famotidine; Take 1 tablet (20 mg total) by mouth 2 (two) times daily. For itching  Dispense: 180 tablet; Refill: 2 -     Iron (Ferrous Sulfate); Take 325  mg by mouth daily.  Dispense: 30 tablet; Refill: 2     I provided 30 minutes of face-to-face time during this encounter reviewing patient's last visit with me, patient's  most recent visit with cardiology,  nephrology,  and neurology,  recent surgical and non surgical procedures, previous  labs and imaging studies, counseling on currently addressed issues,  and post visit ordering to diagnostics and therapeutics .   Follow-up: No follow-ups on file.   Sherlene Shams, MD

## 2023-01-01 MED ORDER — IRON (FERROUS SULFATE) 325 (65 FE) MG PO TABS: 325.0000 mg | ORAL_TABLET | Freq: Every day | ORAL | 2 refills | Status: DC

## 2023-01-01 NOTE — Assessment & Plan Note (Signed)
With chronic urticaria.Marland Kitchen advised her to be more consistent with use of antihistamines

## 2023-01-01 NOTE — Assessment & Plan Note (Signed)
Advised to resume LABA /steroid MDI.  Advai rx written

## 2023-01-01 NOTE — Assessment & Plan Note (Signed)
Occurring post operatively from TKR in Feb 2023. Iron stores have fallen. Resume iron   Lab Results  Component Value Date   IRON 31 (L) 12/30/2022   TIBC 368.2 12/30/2022   FERRITIN 7.6 (L) 12/30/2022   Lab Results  Component Value Date   WBC 5.6 10/28/2022   HGB 11.6 (L) 10/28/2022   HCT 34.6 (L) 10/28/2022   MCV 83.8 10/28/2022   PLT 238 10/28/2022

## 2023-01-01 NOTE — Assessment & Plan Note (Addendum)
Mild bronchiectasis and chronic bronchitis.  Patient has stopped using Advair scheduled and hass become symptomatic

## 2023-01-01 NOTE — Assessment & Plan Note (Addendum)
Her GFR Is stable and she has been avioding NSAIDS ,  Follow up with nephrology as planned, every 6 months   Lab Results  Component Value Date   CREATININE 1.16 12/30/2022

## 2023-01-03 ENCOUNTER — Other Ambulatory Visit: Payer: Self-pay | Admitting: Internal Medicine

## 2023-01-03 NOTE — Telephone Encounter (Signed)
Is it okay to refuse? Medication was d/c'd during office visit on 11/30/2022.

## 2023-01-04 ENCOUNTER — Encounter: Payer: Self-pay | Admitting: Internal Medicine

## 2023-01-19 ENCOUNTER — Ambulatory Visit (INDEPENDENT_AMBULATORY_CARE_PROVIDER_SITE_OTHER): Payer: Medicare Other | Admitting: Internal Medicine

## 2023-01-19 ENCOUNTER — Encounter: Payer: Self-pay | Admitting: Internal Medicine

## 2023-01-19 VITALS — BP 158/80 | HR 75 | Ht 64.0 in | Wt 185.8 lb

## 2023-01-19 DIAGNOSIS — I480 Paroxysmal atrial fibrillation: Secondary | ICD-10-CM

## 2023-01-19 DIAGNOSIS — L281 Prurigo nodularis: Secondary | ICD-10-CM

## 2023-01-19 DIAGNOSIS — I10 Essential (primary) hypertension: Secondary | ICD-10-CM | POA: Diagnosis not present

## 2023-01-19 DIAGNOSIS — D509 Iron deficiency anemia, unspecified: Secondary | ICD-10-CM | POA: Diagnosis not present

## 2023-01-19 DIAGNOSIS — Z1231 Encounter for screening mammogram for malignant neoplasm of breast: Secondary | ICD-10-CM

## 2023-01-19 MED ORDER — LOSARTAN POTASSIUM 100 MG PO TABS: 100.0000 mg | ORAL_TABLET | Freq: Every day | ORAL | 1 refills | Status: DC

## 2023-01-19 NOTE — Patient Instructions (Addendum)
You can take famotidine and fexofenadine together before breakfast and before dinner (every 12 hours)  If this combination  helps control the itching but wears off too soon ,  we can increase the fexofenadine  to every 8 hours   The iron supplements can make you constipated and that will make your hemorrhoid bleed,  So please  take your gummies for constipation  every night   Let me know what is in your gummies.  Is it a stool softener ?  What is the ingredient ?    Your blood pressure should be 130/80 or less please check at home and send me reaidngs   We will recheck your  iron and hemoglobin  in mid December   Your annual mammogram has been ordered AND IS DUE in mid December .  [facility] will not allow [place] to schedule it for you,  so please  call to make your appointment [phone]

## 2023-01-19 NOTE — Progress Notes (Unsigned)
Subjective:  Patient ID: Jamie Anderson, female    DOB: May 03, 1935  Age: 87 y.o. MRN: [id number]  CC: {There were no encounter diagnoses. (Refresh or delete this SmartLink)}   HPI Jamie Anderson presents for  Chief Complaint  Patient presents with   Medical Management of Chronic Issues    6 month follow up     1) PAF:  she is taking Eliquis for mitigation of stroke risk , and Toprol XL 25 mg daily  2)  chronic urticaria:  no better despite  taking xyzal, famotidine , fexofenadine   3) IDA:  noted at last visit,  iron supplementation resumed  4)  seeing blood with BMS;s has a hemorrhoid   4) vitamin D defciency   6) HTN:  Hypertension: patient checks blood pressure  daily.   Readings have been for the most part <130/80 at rest . Patient is following a reduced salt diet most days and is taking medications as prescribed   Outpatient Medications Prior to Visit  Medication Sig Dispense Refill   acetaminophen (TYLENOL) 325 MG tablet Take 1-2 tablets (325-650 mg total) by mouth every 6 (six) hours as needed for mild pain (pain score 1-3 or temp > 100.5). 40 tablet 1   apixaban (ELIQUIS) 5 MG TABS tablet Take 1 tablet (5 mg total) by mouth 2 (two) times daily. 60 tablet 5   famotidine (PEPCID) 20 MG tablet Take 1 tablet (20 mg total) by mouth 2 (two) times daily. For itching 180 tablet 2   fexofenadine (ALLEGRA) 180 MG tablet Take 1 tablet (180 mg total) by mouth 2 (two) times daily. For itching 180 tablet 2   fluticasone-salmeterol (ADVAIR) 100-50 MCG/ACT AEPB Inhale 1 puff into the lungs 2 (two) times daily as needed. 180 each 1   Iron, Ferrous Sulfate, 325 (65 Fe) MG TABS Take 325 mg by mouth daily. 30 tablet 2   levocetirizine (XYZAL) 5 MG tablet Take 5 mg by mouth daily.     losartan (COZAAR) 100 MG tablet Take 1 tablet (100 mg total) by mouth daily.     metoprolol succinate (TOPROL-XL) 25 MG 24 hr tablet Take 25 mg by mouth daily.     pantoprazole (PROTONIX) 40 MG tablet Take 1  tablet (40 mg total) by mouth daily. For gastritis 100 tablet 3   sertraline (ZOLOFT) 100 MG tablet TAKE 1 TABLET BY MOUTH DAILY 90 tablet 3   VITAMIN D PO Take 1,000 Int'l Units/day by mouth daily.     metoprolol tartrate (LOPRESSOR) 25 MG tablet Take 25 mg by mouth. (Patient not taking: Reported on 01/19/2023)     No facility-administered medications prior to visit.    Review of Systems;  Patient denies headache, fevers, malaise, unintentional weight loss, skin rash, eye pain, sinus congestion and sinus pain, sore throat, dysphagia,  hemoptysis , cough, dyspnea, wheezing, chest pain, palpitations, orthopnea, edema, abdominal pain, nausea, melena, diarrhea, constipation, flank pain, dysuria, hematuria, urinary  Frequency, nocturia, numbness, tingling, seizures,  Focal weakness, Loss of consciousness,  Tremor, insomnia, depression, anxiety, and suicidal ideation.      Objective:  BP (!) 158/80   Pulse 75   Ht 5\' 4"  (1.626 m)   Wt 185 lb 12.8 oz (84.3 kg)   SpO2 98%   BMI 31.89 kg/m   BP Readings from Last 3 Encounters:  01/19/23 (!) 158/80  12/30/22 120/70  12/15/22 132/74    Wt Readings from Last 3 Encounters:  01/19/23 185 lb 12.8 oz (84.3  kg)  12/30/22 183 lb (83 kg)  12/15/22 180 lb 9.6 oz (81.9 kg)    Physical Exam  Lab Results  Component Value Date   HGBA1C 5.6 07/19/2022   HGBA1C 5.5 01/30/2018   HGBA1C 5.4 01/15/2015    Lab Results  Component Value Date   CREATININE 1.16 12/30/2022   CREATININE 0.98 10/28/2022   CREATININE 1.24 (H) 07/27/2022    Lab Results  Component Value Date   WBC 5.6 10/28/2022   HGB 11.6 (L) 10/28/2022   HCT 34.6 (L) 10/28/2022   PLT 238 10/28/2022   GLUCOSE 95 12/30/2022   CHOL 234 (H) 12/30/2022   TRIG 95.0 12/30/2022   HDL 71.40 12/30/2022   LDLDIRECT 133.0 12/30/2022   LDLCALC 143 (H) 12/30/2022   ALT 10 12/30/2022   AST 16 12/30/2022   NA 138 12/30/2022   K 4.4 12/30/2022   CL 105 12/30/2022   CREATININE 1.16  12/30/2022   BUN 18 12/30/2022   CO2 28 12/30/2022   TSH 1.32 07/19/2022   INR 0.9 05/29/2012   HGBA1C 5.6 07/19/2022   MICROALBUR 1.5 09/27/2022    CT ABDOMEN PELVIS W CONTRAST  Result Date: 10/28/2022 CLINICAL DATA:  Right lower quadrant pain EXAM: CT ABDOMEN AND PELVIS WITH CONTRAST TECHNIQUE: Multidetector CT imaging of the abdomen and pelvis was performed using the standard protocol following bolus administration of intravenous contrast. RADIATION DOSE REDUCTION: This exam was performed according to the departmental dose-optimization program which includes automated exposure control, adjustment of the mA and/or kV according to patient size and/or use of iterative reconstruction technique. CONTRAST:  OMNIPAQUE IOHEXOL 300 MG/ML  SOLN COMPARISON:  CT 02/06/2014 FINDINGS: Lower chest: Lung bases demonstrate no acute airspace disease. Hepatobiliary: No focal liver abnormality is seen. No gallstones, gallbladder wall thickening, or biliary dilatation. Pancreas: Unremarkable. No pancreatic ductal dilatation or surrounding inflammatory changes. Spleen: Normal in size without focal abnormality. Adrenals/Urinary Tract: Left adrenal gland is normal. Stable 11 mm right adrenal nodule consistent with adenoma, no imaging follow-up is recommended. Kidneys show no hydronephrosis. Cysts and subcentimeter hypodensities in the kidneys too small to further characterize, no specific imaging follow-up is recommended. Bladder is normal Stomach/Bowel: Stomach nonenlarged. No dilated small bowel. No acute bowel wall thickening. Nonvisualized appendix but no right lower quadrant inflammation. Possible pill fragment within right lower quadrant small bowel. Vascular/Lymphatic: Moderate aortic atherosclerosis. No aneurysm. No suspicious lymph nodes. Reproductive: Calcified uterine fibroids.  No adnexal mass Other: Negative for free air or free fluid. Musculoskeletal: Scoliosis and degenerative changes of the spine. No  acute osseous abnormality IMPRESSION: 1. No CT evidence for acute intra-abdominal or pelvic abnormality. 2. Nonvisualized appendix but no right lower quadrant inflammation. 3. Calcified uterine fibroids. 4. Aortic atherosclerosis. Aortic Atherosclerosis (ICD10-I70.0). Electronically Signed   By: Jasmine Pang M.D.   On: 10/28/2022 17:17    Assessment & Plan:  .There are no diagnoses linked to this encounter.   I provided 30 minutes of face-to-face time during this encounter reviewing patient's last visit with me, patient's  most recent visit with cardiology,  nephrology,  and neurology,  recent surgical and non surgical procedures, previous  labs and imaging studies, counseling on currently addressed issues,  and post visit ordering to diagnostics and therapeutics .   Follow-up: No follow-ups on file.   Sherlene Shams, MD

## 2023-01-19 NOTE — Assessment & Plan Note (Addendum)
With chronic urticaria.. she continues to be in inconsistent with use of antihistamines because she does not feel they help.  She may benefit from  gabapentin  as thre is certainly an element of  neurosis

## 2023-01-19 NOTE — Assessment & Plan Note (Signed)
She is tolerating use of Eliquis for embolic stroke risk mitigation due to  atrial fibrillation. Patient has no signs of bleeding and is advised to notify her specialists prior to any procedure that may required suspension of Eliquis

## 2023-01-20 ENCOUNTER — Encounter: Payer: Self-pay | Admitting: Internal Medicine

## 2023-01-20 ENCOUNTER — Telehealth: Payer: Self-pay | Admitting: Internal Medicine

## 2023-01-20 NOTE — Assessment & Plan Note (Signed)
Occurring post operatively from TKR in Feb 2023. Iron stores have fallen. She has developed constipation and hemorrhoidal bleeding and irriration since resuming iron   Lab Results  Component Value Date   IRON 31 (L) 12/30/2022   TIBC 368.2 12/30/2022   FERRITIN 7.6 (L) 12/30/2022   Lab Results  Component Value Date   WBC 5.6 10/28/2022   HGB 11.6 (L) 10/28/2022   HCT 34.6 (L) 10/28/2022   MCV 83.8 10/28/2022   PLT 238 10/28/2022

## 2023-01-20 NOTE — Telephone Encounter (Signed)
Patient called to give the ingredients of her Fiber Supplement Gummies. Below is the same Gummies the patient is currently taking.

## 2023-01-20 NOTE — Assessment & Plan Note (Addendum)
Not at goal on maximal doses of losartan hctz and metoprolol. Since stopping amlodipine 2.5 mg daily . she reports compliance with medication regimen  but has an elevated reading today in office.  She is not using NSAIDs daily.  Discussed goal of 120/70  (130/80 for patients over 70)  to preserve renal function.  She has been asked to check her  BP  at home and  submit readings for evaluation. Renal function, electrolytes and screen for proteinuria are all normal

## 2023-01-27 ENCOUNTER — Telehealth: Payer: Self-pay | Admitting: Cardiovascular Disease

## 2023-01-27 NOTE — Telephone Encounter (Signed)
Called and spoke to pt.   Pt stated that she has been having a rash for 1 year.  She has bumps that can appear in patches on her arms, legs, neck and mid section.   Pt stated that she has been to a dermatologist to treat the rash and received shots. However, they did not help with the rash. Pt will use Cerva lotion to temporarily relieve the itching.   Pt stated on the paper that comes with her Eliquis prescription says it can cause a rash.   Pt wondering if there is something else she can take. Informed pt that there are other anticoagulants. However, Dr. Mariah Milling to advise.

## 2023-01-27 NOTE — Telephone Encounter (Signed)
Pt c/o medication issue:  1. Name of Medication:apixaban (ELIQUIS) 5 MG TABS tablet    2. How are you currently taking this medication (dosage and times per day)? As written  3. Are you having a reaction (difficulty breathing--STAT)? rash  4. What is your medication issue? Pt believes medication is causing rash

## 2023-01-28 NOTE — Telephone Encounter (Signed)
Called patient no answer and unable to leave a message.

## 2023-01-31 ENCOUNTER — Telehealth: Payer: Self-pay | Admitting: Cardiovascular Disease

## 2023-01-31 MED ORDER — RIVAROXABAN 20 MG PO TABS: 20.0000 mg | ORAL_TABLET | Freq: Every day | ORAL | 1 refills | Status: DC

## 2023-01-31 NOTE — Telephone Encounter (Signed)
Patient is calling to follow up on this message by Dr. Mariah Milling:  Typically we do not see a rash on Eliquis  However with that said she is welcome to try alternate blood thinner  She can change from Eliquis over to Xarelto 20 mg once a day and try that for a month to see if that helps the rash  Thx

## 2023-01-31 NOTE — Telephone Encounter (Signed)
Spoke with patient and reviewed provider reply and recommendations. She would in fact like to switch to xarelto to see if her rash will go away. Prescription was sent into pharmacy and she was very appreciative. No further needs at this time.

## 2023-02-02 NOTE — Telephone Encounter (Signed)
Called and spoke with patient. Informed patient of the following from Dr. Mariah Milling.  Typically we do not see a rash on Eliquis However with that said she is welcome to try alternate blood thinner She can change from Eliquis over to Xarelto 20 mg once a day and try that for a month to see if that helps the rash Thx TGollan    Patient verbalizes understanding. Patient states that she is going to try the Xarelto for a month and see if her rash improves.

## 2023-02-08 DIAGNOSIS — N2581 Secondary hyperparathyroidism of renal origin: Secondary | ICD-10-CM | POA: Diagnosis not present

## 2023-02-08 DIAGNOSIS — N1831 Chronic kidney disease, stage 3a: Secondary | ICD-10-CM | POA: Diagnosis not present

## 2023-02-08 DIAGNOSIS — D631 Anemia in chronic kidney disease: Secondary | ICD-10-CM | POA: Diagnosis not present

## 2023-02-08 DIAGNOSIS — I10 Essential (primary) hypertension: Secondary | ICD-10-CM | POA: Diagnosis not present

## 2023-02-21 ENCOUNTER — Other Ambulatory Visit (INDEPENDENT_AMBULATORY_CARE_PROVIDER_SITE_OTHER): Payer: Medicare Other

## 2023-02-21 DIAGNOSIS — M1712 Unilateral primary osteoarthritis, left knee: Secondary | ICD-10-CM | POA: Diagnosis not present

## 2023-02-21 DIAGNOSIS — D509 Iron deficiency anemia, unspecified: Secondary | ICD-10-CM

## 2023-02-21 DIAGNOSIS — M1612 Unilateral primary osteoarthritis, left hip: Secondary | ICD-10-CM | POA: Diagnosis not present

## 2023-02-21 LAB — CBC WITH DIFFERENTIAL/PLATELET
Basophils Absolute: 0 10*3/uL (ref 0.0–0.1)
Basophils Relative: 0.7 % (ref 0.0–3.0)
Eosinophils Absolute: 0.2 10*3/uL (ref 0.0–0.7)
Eosinophils Relative: 2.9 % (ref 0.0–5.0)
HCT: 34.2 % — ABNORMAL LOW (ref 36.0–46.0)
Hemoglobin: 11.1 g/dL — ABNORMAL LOW (ref 12.0–15.0)
Lymphocytes Relative: 20.5 % (ref 12.0–46.0)
Lymphs Abs: 1.1 10*3/uL (ref 0.7–4.0)
MCHC: 32.5 g/dL (ref 30.0–36.0)
MCV: 84.2 fL (ref 78.0–100.0)
Monocytes Absolute: 0.3 10*3/uL (ref 0.1–1.0)
Monocytes Relative: 6.4 % (ref 3.0–12.0)
Neutro Abs: 3.7 10*3/uL (ref 1.4–7.7)
Neutrophils Relative %: 69.5 % (ref 43.0–77.0)
Platelets: 218 10*3/uL (ref 150.0–400.0)
RBC: 4.06 Mil/uL (ref 3.87–5.11)
RDW: 17.9 % — ABNORMAL HIGH (ref 11.5–15.5)
WBC: 5.4 10*3/uL (ref 4.0–10.5)

## 2023-02-21 LAB — IBC + FERRITIN
Ferritin: 18.3 ng/mL (ref 10.0–291.0)
Iron: 91 ug/dL (ref 42–145)
Saturation Ratios: 28.6 % (ref 20.0–50.0)
TIBC: 317.8 ug/dL (ref 250.0–450.0)
Transferrin: 227 mg/dL (ref 212.0–360.0)

## 2023-02-22 ENCOUNTER — Ambulatory Visit
Admission: RE | Admit: 2023-02-22 | Discharge: 2023-02-22 | Disposition: A | Discharge status: Home / Self Care | Payer: Medicare Other | Source: Ambulatory Visit | Attending: Internal Medicine | Admitting: Internal Medicine

## 2023-02-22 DIAGNOSIS — Z1231 Encounter for screening mammogram for malignant neoplasm of breast: Secondary | ICD-10-CM | POA: Diagnosis not present

## 2023-03-16 ENCOUNTER — Other Ambulatory Visit: Payer: Self-pay | Admitting: Internal Medicine

## 2023-03-17 NOTE — Telephone Encounter (Signed)
 Historical provider Last OV: 01/19/2023 Next OV: 07/19/2023

## 2023-05-10 ENCOUNTER — Other Ambulatory Visit: Payer: Self-pay | Admitting: Cardiovascular Disease

## 2023-05-18 ENCOUNTER — Other Ambulatory Visit: Payer: Self-pay | Admitting: Internal Medicine

## 2023-05-18 DIAGNOSIS — M25561 Pain in right knee: Secondary | ICD-10-CM

## 2023-05-20 DIAGNOSIS — L309 Dermatitis, unspecified: Secondary | ICD-10-CM | POA: Diagnosis not present

## 2023-05-20 DIAGNOSIS — L821 Other seborrheic keratosis: Secondary | ICD-10-CM | POA: Diagnosis not present

## 2023-05-24 ENCOUNTER — Ambulatory Visit: Payer: Medicare Other | Admitting: Dermatology

## 2023-06-03 ENCOUNTER — Other Ambulatory Visit: Payer: Self-pay | Admitting: Cardiovascular Disease

## 2023-06-03 DIAGNOSIS — I480 Paroxysmal atrial fibrillation: Secondary | ICD-10-CM

## 2023-06-03 NOTE — Telephone Encounter (Signed)
 Eliquis 5mg  refill request received. Patient is 88 years old, weight-84.3kg, Crea-1.16 on 12/30/22, Diagnosis-Afib, and last seen by Dr. Mariah Milling on 12/15/22. Dose is appropriate based on dosing criteria.   Pt has been on xarelto and last sent on 01/31/23 and was switched due to rash while on eliquis per note. Limited supply was sent of xarelto. Spoke with pharmacist and she states the pt called in the eliquis refill yesterday or today.  Pharmacist states she picked up eliquis 04/01/23 & 2/25  filled and she picked it up.  Will try to call the pt to see what she is taking.

## 2023-06-06 NOTE — Telephone Encounter (Signed)
 Called pt and there was no answer so left a message for her to call back. Left the direct call back number.  Need to know if she is taking eliquis or xarelto and send in appropiate refill

## 2023-06-06 NOTE — Telephone Encounter (Signed)
 Pt returned the call and stated she has been using the eliquis 5mg  bid, she states she used xarelto for one month due to her skin condition and rash she had. She states she used the xarelto one month and then went back to eliquis and is currently out of refills. Advised will send and she was grateful.   Per Dr. Mariah Milling 01/27/23 note:  Typically we do not see a rash on Eliquis  However with that said she is welcome to try alternate blood thinner  She can change from Eliquis over to Xarelto 20 mg once a day and try that for a month to see if that helps the rash

## 2023-06-08 DIAGNOSIS — N1831 Chronic kidney disease, stage 3a: Secondary | ICD-10-CM | POA: Diagnosis not present

## 2023-06-08 DIAGNOSIS — N2581 Secondary hyperparathyroidism of renal origin: Secondary | ICD-10-CM | POA: Diagnosis not present

## 2023-06-08 DIAGNOSIS — I10 Essential (primary) hypertension: Secondary | ICD-10-CM | POA: Diagnosis not present

## 2023-06-08 DIAGNOSIS — D631 Anemia in chronic kidney disease: Secondary | ICD-10-CM | POA: Diagnosis not present

## 2023-06-23 DIAGNOSIS — M1712 Unilateral primary osteoarthritis, left knee: Secondary | ICD-10-CM | POA: Diagnosis not present

## 2023-07-14 DIAGNOSIS — M47816 Spondylosis without myelopathy or radiculopathy, lumbar region: Secondary | ICD-10-CM | POA: Diagnosis not present

## 2023-07-14 DIAGNOSIS — M51369 Other intervertebral disc degeneration, lumbar region without mention of lumbar back pain or lower extremity pain: Secondary | ICD-10-CM | POA: Diagnosis not present

## 2023-07-14 DIAGNOSIS — M791 Myalgia, unspecified site: Secondary | ICD-10-CM | POA: Diagnosis not present

## 2023-07-19 ENCOUNTER — Encounter: Payer: Self-pay | Admitting: Internal Medicine

## 2023-07-19 ENCOUNTER — Ambulatory Visit: Payer: Medicare Other | Admitting: Internal Medicine

## 2023-07-19 VITALS — BP 130/70 | HR 70 | Ht 64.0 in | Wt 180.8 lb

## 2023-07-19 DIAGNOSIS — M7989 Other specified soft tissue disorders: Secondary | ICD-10-CM | POA: Diagnosis not present

## 2023-07-19 DIAGNOSIS — E538 Deficiency of other specified B group vitamins: Secondary | ICD-10-CM | POA: Diagnosis not present

## 2023-07-19 DIAGNOSIS — Z9181 History of falling: Secondary | ICD-10-CM | POA: Diagnosis not present

## 2023-07-19 DIAGNOSIS — M79632 Pain in left forearm: Secondary | ICD-10-CM | POA: Diagnosis not present

## 2023-07-19 DIAGNOSIS — D6869 Other thrombophilia: Secondary | ICD-10-CM | POA: Diagnosis not present

## 2023-07-19 DIAGNOSIS — N183 Chronic kidney disease, stage 3 unspecified: Secondary | ICD-10-CM | POA: Diagnosis not present

## 2023-07-19 DIAGNOSIS — I10 Essential (primary) hypertension: Secondary | ICD-10-CM

## 2023-07-19 DIAGNOSIS — M1000 Idiopathic gout, unspecified site: Secondary | ICD-10-CM | POA: Diagnosis not present

## 2023-07-19 DIAGNOSIS — E66811 Obesity, class 1: Secondary | ICD-10-CM

## 2023-07-19 DIAGNOSIS — E785 Hyperlipidemia, unspecified: Secondary | ICD-10-CM | POA: Diagnosis not present

## 2023-07-19 DIAGNOSIS — D509 Iron deficiency anemia, unspecified: Secondary | ICD-10-CM

## 2023-07-19 DIAGNOSIS — R296 Repeated falls: Secondary | ICD-10-CM | POA: Insufficient documentation

## 2023-07-19 DIAGNOSIS — E6609 Other obesity due to excess calories: Secondary | ICD-10-CM

## 2023-07-19 MED ORDER — FLUTICASONE PROPIONATE 50 MCG/ACT NA SUSP: 2.0000 | Freq: Every day | NASAL | 6 refills | Status: AC

## 2023-07-19 MED ORDER — IRON (FERROUS SULFATE) 325 (65 FE) MG PO TABS: 325.0000 mg | ORAL_TABLET | Freq: Every day | ORAL | 2 refills | Status: DC

## 2023-07-19 MED ORDER — FLUTICASONE-SALMETEROL 100-50 MCG/ACT IN AEPB: 1.0000 | INHALATION_SPRAY | Freq: Two times a day (BID) | RESPIRATORY_TRACT | 1 refills | Status: DC | PRN

## 2023-07-19 NOTE — Assessment & Plan Note (Signed)
 secondary to parosyxysmal  atrial fibrillation.  She is tolerating use of Eliquis  for embolic stroke risk mitigation due to  atrial fibrillation. Patient had a recent fall wit blunt head trauma one week ago  she has no signs of bleeding

## 2023-07-19 NOTE — Assessment & Plan Note (Addendum)
 Recent events reviewed,  she sustained blunt head trauma while anticogulated.  Cautioned that future blows to the head will necessitate discontinuation of Eliquis  .  DG left forearm ordered

## 2023-07-19 NOTE — Assessment & Plan Note (Signed)
Her GFR Is stable and she has been avioding NSAIDS ,  Follow up with nephrology as planned, every 6 months   Lab Results  Component Value Date   CREATININE 1.16 12/30/2022

## 2023-07-19 NOTE — Patient Instructions (Signed)
 You were VERY FORTUNATE that your fall did not cause a hemorrhage  in your head    Please check your blood pressure  ONCE A WEEK at home and send me the readings  in one month so I can determine if you need to start an additional  medication to lower your blood pressure .

## 2023-07-19 NOTE — Assessment & Plan Note (Signed)
 Well controlled on current regimen of losartan  and metoprolol  XL.   Renal function low but stable, no changes today.

## 2023-07-19 NOTE — Progress Notes (Signed)
 Subjective:  Patient ID: Jamie Anderson, female    DOB: [birth year]  Age: 88 y.o. MRN: [id number]  CC: The primary encounter diagnosis was Essential hypertension. Diagnoses of Hyperlipidemia LDL goal <70, Iron  deficiency anemia, unspecified iron  deficiency anemia type, and Class 1 obesity due to excess calories without serious comorbidity with body mass index (BMI) of 32.0 to 32.9 in adult were also pertinent to this visit.   HPI Jamie Anderson presents for  Chief Complaint  Patient presents with   Medical Management of Chronic Issues    6 month follow up    88 yr old female on CKD,  chronic anticoagulation with eliquis  secondary to history of PAF, persistent left hip pain  presents after a recent fall while leaving a friend's house  tripped over some rocks.  with blunt head trauma and left wrist pain.  Fall occurred 6 days ago,  still having  wrist pain,  left breast was bruised .  Did not go to ER .  allergic rhinitis managed with zyrtec and some eye drops.    HTN:  Hypertension: patient checks blood pressure twice weekly at home.  Readings have been for the most part <130/80 at rest . Patient is following a reduced salt diet most days and is taking medications as prescribed    Outpatient Medications Prior to Visit  Medication Sig Dispense Refill   acetaminophen  (TYLENOL ) 325 MG tablet Take 1-2 tablets (325-650 mg total) by mouth every 6 (six) hours as needed for mild pain (pain score 1-3 or temp > 100.5). 40 tablet 1   cyanocobalamin  (VITAMIN B12) 1000 MCG tablet Take 1,000 mcg by mouth daily.     ELIQUIS  5 MG TABS tablet Take 1 tablet (5 mg total) by mouth 2 (two) times daily. 60 tablet 5   famotidine  (PEPCID ) 20 MG tablet Take 1 tablet (20 mg total) by mouth 2 (two) times daily. For itching 180 tablet 2   fluticasone -salmeterol (ADVAIR ) 100-50 MCG/ACT AEPB Inhale 1 puff into the lungs 2 (two) times daily as needed. 180 each 1   Iron , Ferrous Sulfate , 325 (65 Fe) MG TABS Take 325 mg  by mouth daily. 30 tablet 2   losartan  (COZAAR ) 100 MG tablet TAKE 1 TABLET BY MOUTH DAILY 30 tablet 11   metoprolol  succinate (TOPROL -XL) 25 MG 24 hr tablet TAKE 1 TABLET BY MOUTH AT  BEDTIME 100 tablet 2   pantoprazole  (PROTONIX ) 40 MG tablet Take 1 tablet (40 mg total) by mouth daily. For gastritis 100 tablet 3   sertraline  (ZOLOFT ) 100 MG tablet TAKE 1 TABLET BY MOUTH DAILY 90 tablet 3   VITAMIN D  PO Take 1,000 Int'l Units/day by mouth daily.     fexofenadine  (ALLEGRA ) 180 MG tablet Take 1 tablet (180 mg total) by mouth 2 (two) times daily. For itching (Patient not taking: Reported on 07/19/2023) 180 tablet 2   levocetirizine (XYZAL ) 5 MG tablet Take 5 mg by mouth daily. (Patient not taking: Reported on 07/19/2023)     No facility-administered medications prior to visit.    Review of Systems;  Patient denies headache, fevers, malaise, unintentional weight loss, skin rash, eye pain, sinus congestion and sinus pain, sore throat, dysphagia,  hemoptysis , cough, dyspnea, wheezing, chest pain, palpitations, orthopnea, edema, abdominal pain, nausea, melena, diarrhea, constipation, flank pain, dysuria, hematuria, urinary  Frequency, nocturia, numbness, tingling, seizures,  Focal weakness, Loss of consciousness,  Tremor, insomnia, depression, anxiety, and suicidal ideation.      Objective:  BP 130/70  Pulse 70   Ht 5\' 4"  (1.626 m)   Wt 180 lb 12.8 oz (82 kg)   SpO2 98%   BMI 31.03 kg/m   BP Readings from Last 3 Encounters:  07/19/23 130/70  01/19/23 (!) 158/80  12/30/22 120/70    Wt Readings from Last 3 Encounters:  07/19/23 180 lb 12.8 oz (82 kg)  01/19/23 185 lb 12.8 oz (84.3 kg)  12/30/22 183 lb (83 kg)    Physical Exam Vitals reviewed.  Constitutional:      General: She is not in acute distress.    Appearance: Normal appearance. She is normal weight. She is not ill-appearing, toxic-appearing or diaphoretic.  HENT:     Head: Normocephalic.  Eyes:     General: No scleral  icterus.       Right eye: No discharge.        Left eye: No discharge.     Conjunctiva/sclera: Conjunctivae normal.  Cardiovascular:     Rate and Rhythm: Normal rate and regular rhythm.     Heart sounds: Normal heart sounds.  Pulmonary:     Effort: Pulmonary effort is normal. No respiratory distress.     Breath sounds: Normal breath sounds.  Musculoskeletal:        General: Normal range of motion.     Right forearm: Normal. No swelling or edema.     Left forearm: Swelling and tenderness present.       Arms:  Skin:    General: Skin is warm and dry.  Neurological:     General: No focal deficit present.     Mental Status: She is alert and oriented to person, place, and time. Mental status is at baseline.  Psychiatric:        Mood and Affect: Mood normal.        Behavior: Behavior normal.        Thought Content: Thought content normal.        Judgment: Judgment normal.   Lab Results  Component Value Date   HGBA1C 5.6 07/19/2022   HGBA1C 5.5 01/30/2018   HGBA1C 5.4 01/15/2015    Lab Results  Component Value Date   CREATININE 1.16 12/30/2022   CREATININE 0.98 10/28/2022   CREATININE 1.24 (H) 07/27/2022    Lab Results  Component Value Date   WBC 5.4 02/21/2023   HGB 11.1 (L) 02/21/2023   HCT 34.2 (L) 02/21/2023   PLT 218.0 02/21/2023   GLUCOSE 95 12/30/2022   CHOL 234 (H) 12/30/2022   TRIG 95.0 12/30/2022   HDL 71.40 12/30/2022   LDLDIRECT 133.0 12/30/2022   LDLCALC 143 (H) 12/30/2022   ALT 10 12/30/2022   AST 16 12/30/2022   NA 138 12/30/2022   K 4.4 12/30/2022   CL 105 12/30/2022   CREATININE 1.16 12/30/2022   BUN 18 12/30/2022   CO2 28 12/30/2022   TSH 1.32 07/19/2022   INR 0.9 05/29/2012   HGBA1C 5.6 07/19/2022   MICROALBUR 1.5 09/27/2022    MM 3D SCREENING MAMMOGRAM BILATERAL BREAST Result Date: 02/24/2023 CLINICAL DATA:  Screening. EXAM: DIGITAL SCREENING BILATERAL MAMMOGRAM WITH TOMOSYNTHESIS AND CAD TECHNIQUE: Bilateral screening digital  craniocaudal and mediolateral oblique mammograms were obtained. Bilateral screening digital breast tomosynthesis was performed. The images were evaluated with computer-aided detection. COMPARISON:  Previous exam(s). ACR Breast Density Category b: There are scattered areas of fibroglandular density. FINDINGS: There are no findings suspicious for malignancy. IMPRESSION: No mammographic evidence of malignancy. A result letter of this screening mammogram will be mailed  directly to the patient. RECOMMENDATION: Screening mammogram in one year. (Code:SM-B-01Y) BI-RADS CATEGORY  1: Negative. Electronically Signed   By: Rinda Cheers M.D.   On: 02/24/2023 13:05    Assessment & Plan:  .Essential hypertension  Hyperlipidemia LDL goal <70  Iron  deficiency anemia, unspecified iron  deficiency anemia type  Class 1 obesity due to excess calories without serious comorbidity with body mass index (BMI) of 32.0 to 32.9 in adult     I spent 34 minutes on the day of this face to face encounter reviewing patient's  most recent visit with cardiology,  nephrology,  and orthopedics, prior relevant surgical and non surgical procedures, recent  labs and imaging studies, counseling on risks and benefits of anticoagulation , reviewing the assessment and plan with patient, and post visit ordering and reviewing of  diagnostics and therapeutics with patient  .   Follow-up: No follow-ups on file.   Thersia Flax, MD

## 2023-07-20 ENCOUNTER — Other Ambulatory Visit (INDEPENDENT_AMBULATORY_CARE_PROVIDER_SITE_OTHER)

## 2023-07-20 ENCOUNTER — Ambulatory Visit (INDEPENDENT_AMBULATORY_CARE_PROVIDER_SITE_OTHER)

## 2023-07-20 DIAGNOSIS — M7989 Other specified soft tissue disorders: Secondary | ICD-10-CM

## 2023-07-20 DIAGNOSIS — M19032 Primary osteoarthritis, left wrist: Secondary | ICD-10-CM | POA: Diagnosis not present

## 2023-07-20 DIAGNOSIS — I10 Essential (primary) hypertension: Secondary | ICD-10-CM | POA: Diagnosis not present

## 2023-07-20 DIAGNOSIS — E785 Hyperlipidemia, unspecified: Secondary | ICD-10-CM | POA: Diagnosis not present

## 2023-07-20 DIAGNOSIS — D485 Neoplasm of uncertain behavior of skin: Secondary | ICD-10-CM | POA: Diagnosis not present

## 2023-07-20 DIAGNOSIS — M79632 Pain in left forearm: Secondary | ICD-10-CM

## 2023-07-20 DIAGNOSIS — L408 Other psoriasis: Secondary | ICD-10-CM | POA: Diagnosis not present

## 2023-07-20 DIAGNOSIS — D509 Iron deficiency anemia, unspecified: Secondary | ICD-10-CM

## 2023-07-20 DIAGNOSIS — C44722 Squamous cell carcinoma of skin of right lower limb, including hip: Secondary | ICD-10-CM | POA: Diagnosis not present

## 2023-07-20 DIAGNOSIS — L309 Dermatitis, unspecified: Secondary | ICD-10-CM | POA: Diagnosis not present

## 2023-07-21 ENCOUNTER — Ambulatory Visit: Payer: Self-pay | Admitting: Internal Medicine

## 2023-07-21 LAB — CBC WITH DIFFERENTIAL/PLATELET
Basophils Absolute: 0.1 10*3/uL (ref 0.0–0.1)
Basophils Relative: 1.2 % (ref 0.0–3.0)
Eosinophils Absolute: 0.1 10*3/uL (ref 0.0–0.7)
Eosinophils Relative: 3 % (ref 0.0–5.0)
HCT: 32.7 % — ABNORMAL LOW (ref 36.0–46.0)
Hemoglobin: 10.6 g/dL — ABNORMAL LOW (ref 12.0–15.0)
Lymphocytes Relative: 25.4 % (ref 12.0–46.0)
Lymphs Abs: 1.2 10*3/uL (ref 0.7–4.0)
MCHC: 32.3 g/dL (ref 30.0–36.0)
MCV: 83.9 fl (ref 78.0–100.0)
Monocytes Absolute: 0.4 10*3/uL (ref 0.1–1.0)
Monocytes Relative: 8.1 % (ref 3.0–12.0)
Neutro Abs: 2.9 10*3/uL (ref 1.4–7.7)
Neutrophils Relative %: 62.3 % (ref 43.0–77.0)
Platelets: 201 10*3/uL (ref 150.0–400.0)
RBC: 3.9 Mil/uL (ref 3.87–5.11)
RDW: 17.1 % — ABNORMAL HIGH (ref 11.5–15.5)
WBC: 4.6 10*3/uL (ref 4.0–10.5)

## 2023-07-21 LAB — COMPREHENSIVE METABOLIC PANEL WITH GFR
ALT: 13 U/L (ref 0–35)
AST: 17 U/L (ref 0–37)
Albumin: 4 g/dL (ref 3.5–5.2)
Alkaline Phosphatase: 106 U/L (ref 39–117)
BUN: 21 mg/dL (ref 6–23)
CO2: 25 meq/L (ref 19–32)
Calcium: 9.5 mg/dL (ref 8.4–10.5)
Chloride: 106 meq/L (ref 96–112)
Creatinine, Ser: 1.02 mg/dL (ref 0.40–1.20)
GFR: 50.08 mL/min — ABNORMAL LOW (ref >60.00)
Glucose, Bld: 84 mg/dL (ref 70–99)
Potassium: 4.3 meq/L (ref 3.5–5.1)
Sodium: 140 meq/L (ref 135–145)
Total Bilirubin: 0.3 mg/dL (ref 0.2–1.2)
Total Protein: 6.5 g/dL (ref 6.0–8.3)

## 2023-07-21 LAB — LIPID PANEL
Cholesterol: 237 mg/dL — ABNORMAL HIGH (ref 0–200)
HDL: 67.4 mg/dL (ref >39.00)
LDL Cholesterol: 152 mg/dL — ABNORMAL HIGH (ref 0–99)
NonHDL: 169.5
Total CHOL/HDL Ratio: 4
Triglycerides: 87 mg/dL (ref 0.0–149.0)
VLDL: 17.4 mg/dL (ref 0.0–40.0)

## 2023-07-21 LAB — TSH: TSH: 1.41 u[IU]/mL (ref 0.35–5.50)

## 2023-07-21 LAB — LDL CHOLESTEROL, DIRECT: Direct LDL: 143 mg/dL

## 2023-07-22 ENCOUNTER — Telehealth: Payer: Self-pay

## 2023-07-22 NOTE — Telephone Encounter (Signed)
 Pt is requesting lab results. Pt would like to be called with the results.

## 2023-07-22 NOTE — Telephone Encounter (Signed)
 Copied from CRM [id number]. Topic: Clinical - Lab/Test Results >> Jul 22, 2023  2:35 PM Luane Rumps D wrote: Reason for CRM: Patient called to confirm if Dr. Madelon Scheuermann has gone over her blood work yet, advised patient that the results are still pending review and any plan of care/recommendations will not be available til then. Patient was concerned about possible medications being prescribed before the weekend, told patient to wait for Dr. Madelon Scheuermann to review.

## 2023-07-25 ENCOUNTER — Other Ambulatory Visit: Payer: Self-pay | Admitting: Internal Medicine

## 2023-07-25 DIAGNOSIS — D509 Iron deficiency anemia, unspecified: Secondary | ICD-10-CM

## 2023-07-25 DIAGNOSIS — E538 Deficiency of other specified B group vitamins: Secondary | ICD-10-CM

## 2023-07-25 NOTE — Telephone Encounter (Signed)
 See result note message. LMTCB. Please relay the result note to pt when she returns the call.

## 2023-07-25 NOTE — Telephone Encounter (Signed)
 Copied from CRM [id number]. Topic: Clinical - Lab/Test Results >> Jul 25, 2023  3:21 PM Jamie Anderson C wrote: Reason for CRM: Patient called in regarding lab results, relay results, Patient stated that she was took off all her statins because she was breaking out, but patient stated she would be willing to restart a statin , stated would like to know what she needs to do about her iron ,

## 2023-07-26 ENCOUNTER — Other Ambulatory Visit (INDEPENDENT_AMBULATORY_CARE_PROVIDER_SITE_OTHER)

## 2023-07-26 DIAGNOSIS — D509 Iron deficiency anemia, unspecified: Secondary | ICD-10-CM

## 2023-07-26 DIAGNOSIS — E538 Deficiency of other specified B group vitamins: Secondary | ICD-10-CM

## 2023-07-26 MED ORDER — ROSUVASTATIN CALCIUM 10 MG PO TABS: 10.0000 mg | ORAL_TABLET | Freq: Every day | ORAL | 1 refills | Status: DC

## 2023-07-26 NOTE — Addendum Note (Signed)
 Addended by: Thersia Flax on: 07/26/2023 01:01 PM   Modules accepted: Orders

## 2023-07-26 NOTE — Telephone Encounter (Signed)
 Pt is coming in for the iron  studies today.

## 2023-07-27 LAB — IBC + FERRITIN
Ferritin: 12.2 ng/mL (ref 10.0–291.0)
Iron: 40 ug/dL — ABNORMAL LOW (ref 42–145)
Saturation Ratios: 10.3 % — ABNORMAL LOW (ref 20.0–50.0)
TIBC: 387.8 ug/dL (ref 250.0–450.0)
Transferrin: 277 mg/dL (ref 212.0–360.0)

## 2023-07-27 LAB — VITAMIN B12: Vitamin B-12: 939 pg/mL — ABNORMAL HIGH (ref 211–911)

## 2023-07-28 ENCOUNTER — Ambulatory Visit: Payer: Self-pay | Admitting: Internal Medicine

## 2023-07-28 NOTE — Addendum Note (Signed)
 Addended by: Thersia Flax on: 07/28/2023 06:22 AM   Modules accepted: Orders

## 2023-08-12 DIAGNOSIS — L408 Other psoriasis: Secondary | ICD-10-CM | POA: Diagnosis not present

## 2023-08-18 DIAGNOSIS — H04123 Dry eye syndrome of bilateral lacrimal glands: Secondary | ICD-10-CM | POA: Diagnosis not present

## 2023-08-18 DIAGNOSIS — Z961 Presence of intraocular lens: Secondary | ICD-10-CM | POA: Diagnosis not present

## 2023-08-18 DIAGNOSIS — H43813 Vitreous degeneration, bilateral: Secondary | ICD-10-CM | POA: Diagnosis not present

## 2023-08-19 ENCOUNTER — Other Ambulatory Visit: Payer: Self-pay | Admitting: Internal Medicine

## 2023-08-22 NOTE — Telephone Encounter (Signed)
 Medication was discontinued on 07/19/2022. Is it okay to refuse refill request?

## 2023-09-02 DIAGNOSIS — L820 Inflamed seborrheic keratosis: Secondary | ICD-10-CM | POA: Diagnosis not present

## 2023-09-02 DIAGNOSIS — L2989 Other pruritus: Secondary | ICD-10-CM | POA: Diagnosis not present

## 2023-09-02 DIAGNOSIS — C44722 Squamous cell carcinoma of skin of right lower limb, including hip: Secondary | ICD-10-CM | POA: Diagnosis not present

## 2023-09-08 ENCOUNTER — Other Ambulatory Visit

## 2023-09-12 ENCOUNTER — Other Ambulatory Visit

## 2023-09-14 ENCOUNTER — Other Ambulatory Visit (INDEPENDENT_AMBULATORY_CARE_PROVIDER_SITE_OTHER)

## 2023-09-14 DIAGNOSIS — D509 Iron deficiency anemia, unspecified: Secondary | ICD-10-CM | POA: Diagnosis not present

## 2023-09-15 LAB — CBC WITH DIFFERENTIAL/PLATELET
Basophils Absolute: 0 K/uL (ref 0.0–0.1)
Basophils Relative: 0.9 % (ref 0.0–3.0)
Eosinophils Absolute: 0.1 K/uL (ref 0.0–0.7)
Eosinophils Relative: 2.7 % (ref 0.0–5.0)
HCT: 33.7 % — ABNORMAL LOW (ref 36.0–46.0)
Hemoglobin: 11.3 g/dL — ABNORMAL LOW (ref 12.0–15.0)
Lymphocytes Relative: 24 % (ref 12.0–46.0)
Lymphs Abs: 1.2 K/uL (ref 0.7–4.0)
MCHC: 33.7 g/dL (ref 30.0–36.0)
MCV: 85.2 fl (ref 78.0–100.0)
Monocytes Absolute: 0.4 K/uL (ref 0.1–1.0)
Monocytes Relative: 7.3 % (ref 3.0–12.0)
Neutro Abs: 3.1 K/uL (ref 1.4–7.7)
Neutrophils Relative %: 65.1 % (ref 43.0–77.0)
Platelets: 205 K/uL (ref 150.0–400.0)
RBC: 3.95 Mil/uL (ref 3.87–5.11)
RDW: 17.9 % — ABNORMAL HIGH (ref 11.5–15.5)
WBC: 4.8 K/uL (ref 4.0–10.5)

## 2023-09-15 LAB — IBC + FERRITIN
Ferritin: 24.6 ng/mL (ref 10.0–291.0)
Iron: 73 ug/dL (ref 42–145)
Saturation Ratios: 23.2 % (ref 20.0–50.0)
TIBC: 315 ug/dL (ref 250.0–450.0)
Transferrin: 225 mg/dL (ref 212.0–360.0)

## 2023-09-28 DIAGNOSIS — L408 Other psoriasis: Secondary | ICD-10-CM | POA: Diagnosis not present

## 2023-09-30 DIAGNOSIS — L408 Other psoriasis: Secondary | ICD-10-CM | POA: Diagnosis not present

## 2023-10-03 ENCOUNTER — Other Ambulatory Visit: Payer: Self-pay | Admitting: Internal Medicine

## 2023-10-17 DIAGNOSIS — D631 Anemia in chronic kidney disease: Secondary | ICD-10-CM | POA: Diagnosis not present

## 2023-10-17 DIAGNOSIS — N2581 Secondary hyperparathyroidism of renal origin: Secondary | ICD-10-CM | POA: Diagnosis not present

## 2023-10-17 DIAGNOSIS — I10 Essential (primary) hypertension: Secondary | ICD-10-CM | POA: Diagnosis not present

## 2023-10-17 DIAGNOSIS — N182 Chronic kidney disease, stage 2 (mild): Secondary | ICD-10-CM | POA: Diagnosis not present

## 2023-10-31 ENCOUNTER — Other Ambulatory Visit: Payer: Self-pay | Admitting: Internal Medicine

## 2023-10-31 ENCOUNTER — Telehealth: Payer: Self-pay

## 2023-10-31 MED ORDER — AMLODIPINE BESYLATE 2.5 MG PO TABS: 2.5000 mg | ORAL_TABLET | Freq: Every day | ORAL | 1 refills | Status: DC

## 2023-10-31 NOTE — Telephone Encounter (Signed)
 Copied from CRM #[id number]. Topic: Clinical - Medical Advice >> Oct 31, 2023  3:29 PM Chiquita SQUIBB wrote: Reason for CRM: Patient is calling in as she is suppose to be taking her blood pressure and relaying back to Dr. Tullo and has not yet but has recently started taking them and feels she may need to add a blood pressure pill. She reported 08/14 -155/71 12:30, 08/21 -186/95 also on 08/21 176/87, 08/24 - 182/95, 08/25- 179/86  Some of these are before taking her morning pill and sometimes wakes up dizzy with a headache and that is when she takes her BP. She wanted to add 07/03 it was 183/93.

## 2023-11-01 NOTE — Telephone Encounter (Signed)
 She should take the amlodipine  in the morning with her losartan 

## 2023-11-01 NOTE — Telephone Encounter (Signed)
 Spoke with pt and she stated that she is taking the Losartan  in the morning and the Metoprolol  at night. Pt is okay with adding the Amlodipine  but should she take it in the morning or at night?

## 2023-11-02 NOTE — Telephone Encounter (Signed)
Called pt and informed pt of this information.

## 2023-11-04 NOTE — Telephone Encounter (Unsigned)
 Copied from CRM #[id number]. Topic: General - Other >> Nov 04, 2023  2:21 PM Jamie Anderson wrote: Reason for CRM: Patient would like to let Dr Marylynn know that she took her amLODipine  (NORVASC ) 2.5 MG tablet this morning. At the time of taking the medication, her blood pressure was 185/83 but went down to 160/66.

## 2023-11-08 NOTE — Telephone Encounter (Signed)
 Pt notified to collect more readings and continue medications

## 2023-11-11 NOTE — Telephone Encounter (Signed)
 Patient is scheduled Tuesday at 9:40 am with P.Webb for elevated blood pressure reading.

## 2023-11-11 NOTE — Telephone Encounter (Unsigned)
 Copied from CRM #[id number]. Topic: General - Other >> Nov 11, 2023  2:14 PM Jamie Anderson wrote: Reason for CRM: Pt was instructed to call with the blood pressure readings for this week.  Monday-180/95 Tuesday-152/83 before med 14/71 after meds Wednesday-174/84 Thursday-161/72 before meds and 153/77 after meds Friday-162/77  These readings are prior to taking blood pressure medication and she is limiting her salt intake.

## 2023-11-15 ENCOUNTER — Telehealth: Payer: Self-pay

## 2023-11-15 ENCOUNTER — Encounter: Payer: Self-pay | Admitting: Family

## 2023-11-15 ENCOUNTER — Ambulatory Visit (INDEPENDENT_AMBULATORY_CARE_PROVIDER_SITE_OTHER): Admitting: Family

## 2023-11-15 VITALS — BP 175/78 | HR 69 | Temp 97.9°F | Wt 177.0 lb

## 2023-11-15 DIAGNOSIS — I10 Essential (primary) hypertension: Secondary | ICD-10-CM | POA: Diagnosis not present

## 2023-11-15 DIAGNOSIS — Z23 Encounter for immunization: Secondary | ICD-10-CM

## 2023-11-15 MED ORDER — AMLODIPINE BESYLATE 5 MG PO TABS: 5.0000 mg | ORAL_TABLET | Freq: Every day | ORAL | 2 refills | Status: DC

## 2023-11-15 NOTE — Telephone Encounter (Signed)
 Copied from CRM #[id number]. Topic: Clinical - Prescription Issue >> Nov 15, 2023  3:54 PM Jamie Anderson wrote: Reason for CRM: Patient states she had an appointment this morning and the provider prescribed a new medication ,but when patient saw it at pharmacy,its the same medication  she has already been taking. And needs to know what to do

## 2023-11-16 NOTE — Progress Notes (Signed)
 Acute Office Visit  Subjective:     Patient ID: Jamie Anderson, female    DOB: May 10, 1935, 88 y.o.   MRN: [id number]  Chief Complaint  Patient presents with  . Hypertension  . Leg Pain    HPI 88 year old female presents today with complaints of elevated blood pressure. Over the last several weeks, patient's blood pressure has been in the one 40s to one 80s framed systolically. She is currently taking COZAAR  100 mgs daily and Amlodipine  2.5 milligrams. Reports having a decent diet with low sodium. No swelling in her extremities.   Review of Systems  Constitutional: Negative.   HENT: Negative.    Respiratory: Negative.    Cardiovascular: Negative.   Gastrointestinal: Negative.   Genitourinary: Negative.   Musculoskeletal: Negative.   Neurological: Negative.   Endo/Heme/Allergies: Negative.   Psychiatric/Behavioral: Negative.    All other systems reviewed and are negative.  Past Medical History:  Diagnosis Date  . Acute deep vein thrombosis (DVT) of calf muscle vein of right lower extremity (HCC) 05/04/2021  . Arthritis   . Asthma   . Basal cell carcinoma 01/17/2018   Left chest. Infiltrative. Excised 02/06/2018, margins free.  . Basal cell carcinoma 12/14/2021   L nasal dorsum, Mohs [facility] 12/29/2021  . Cystic teratoma of right ovary 11/23/2012   She underwent right salpingo-oophorectomy on April 1 by Dr. Arloa for removal of what turned out to be a cystic teratoma. Hysteroscopy was also done at the time due to postmenopausal bleeding. It is unclear  why the left ovary was not taken out since she is postmenopausal.   . Diastolic dysfunction    a. 09/2021 Echo: EF 55-60%, no rwma, GrII DD, nl RV fxn, mild MR, triv AI.  [name] Dyspnea   . GERD (gastroesophageal reflux disease)   . Granuloma annulare   . Hyperlipidemia   . Hypertension   . Menopause   . Non-obstructive CAD (coronary artery disease)    a. 07/2021 Cor CTA: LM <25, LAD 25-49p, LCX <25p, RCA nl. Ca2+ = 390  (71st%'ile).  [name] PAF (paroxysmal atrial fibrillation) (HCC)    a. 04/2021 - occured following R knee surgery.  CHA2DS2VASc = 5--> Eliquis .  . Plantar fasciitis of right foot 12/24/2014  . Right knee DJD 04/03/2012  . SCC (squamous cell carcinoma) 10/08/2020   R mid lower pretibia, [facility]  . SCC (squamous cell carcinoma) 03/16/2021   R upper pretibial, [facility] 12/14/2021  . Squamous cell carcinoma of skin 02/26/2020   R lower pretibial, clear with bx 04/01/20  . Squamous cell carcinoma of skin 07/15/2020   R mid pretibia, [facility]  . Squamous cell carcinoma of skin 12/09/2020   R lateral pretibia, [facility]  . Squamous cell carcinoma of skin 12/14/2021   R thenar hand dorsum, [facility]. Also Mohs 12/29/2021.  [name] Squamous cell carcinoma of skin 11/17/2022   left mid pretibia, [facility]    Social History   Socioeconomic History  . Marital status: Widowed    Spouse name: Not on file  . Number of children: Not on file  . Years of education: Not on file  . Highest education level: Not on file  Occupational History  . Not on file  Tobacco Use  . Smoking status: Never  . Smokeless tobacco: Never  Vaping Use  . Vaping status: Never Used  Substance and Sexual Activity  . Alcohol use: No    Alcohol/week: 4.0 standard drinks of alcohol    Types: 4 Glasses of wine per week  .  Drug use: No  . Sexual activity: Not Currently  Other Topics Concern  . Not on file  Social History Narrative  . Not on file   Social Drivers of Health   Financial Resource Strain: Low Risk  (08/27/2022)   Overall Financial Resource Strain (CARDIA)   . Difficulty of Paying Living Expenses: Not hard at all  Food Insecurity: No Food Insecurity (08/27/2022)   Hunger Vital Sign   . Worried About Programme researcher, broadcasting/film/video in the Last Year: Never true   . Ran Out of Food in the Last Year: Never true  Transportation Needs: No Transportation Needs (08/27/2022)   PRAPARE - Transportation   . Lack of Transportation (Medical): No   . Lack of  Transportation (Non-Medical): No  Physical Activity: Insufficiently Active (08/27/2022)   Exercise Vital Sign   . Days of Exercise per Week: 3 days   . Minutes of Exercise per Session: 30 min  Stress: No Stress Concern Present (08/27/2022)   [name] of Occupational Health - Occupational Stress Questionnaire   . Feeling of Stress : Not at all  Social Connections: Moderately Integrated (08/27/2022)   Social Connection and Isolation Panel   . Frequency of Communication with Friends and Family: More than three times a week   . Frequency of Social Gatherings with Friends and Family: Three times a week   . Attends Religious Services: More than 4 times per year   . Active Member of Clubs or Organizations: Yes   . Attends Banker Meetings: 1 to 4 times per year   . Marital Status: Widowed  Intimate Partner Violence: Not At Risk (08/27/2022)   Humiliation, Afraid, Rape, and Kick questionnaire   . Fear of Current or Ex-Partner: No   . Emotionally Abused: No   . Physically Abused: No   . Sexually Abused: No    Past Surgical History:  Procedure Laterality Date  . APPENDECTOMY    . CATARACT EXTRACTION, BILATERAL    . DILATION AND CURETTAGE OF UTERUS    . RIGHT OOPHORECTOMY Right 04/08/2012   [name]  . SPINE SURGERY  03/08/2008  . TOTAL KNEE ARTHROPLASTY Right 04/29/2021   Procedure: TOTAL KNEE ARTHROPLASTY;  Surgeon: [name], MD;  Location: [facility] ORS;  Service: Orthopedics;  Laterality: Right;  . VENOUS ABLATION     bilateral    Family History  Problem Relation Age of Onset  . Early death Mother        durig childbirth  . Heart disease Father   . Heart attack Father   . Breast cancer Sister 22       half sister  . Heart disease Sister   . Cancer Brother   . Heart disease Brother        Afib    Allergies  Allergen Reactions  . Almond Meal (Obsolete)   . Amoxicillin  Other (See Comments)  . Amoxicillin -Pot Clavulanate     Hives, nausea  . Calcitriol      Bone pain  . Clarithromycin Other (See Comments)    Unknown  . Colloidal Oatmeal Other (See Comments)    Unknown reaction  . Corn-Containing Products Other (See Comments)    Unknown reaction  corn allergenic extract  . Dust Mite Extract Other (See Comments)  . Gluten Meal Other (See Comments)    Unknown  . Molds & Smuts   . Oat     Unknown reaction  . Other     Almonds - unknown reaction Mold  Spores  . Shrimp [Shellfish Allergy]     Unknown reaction     Current Outpatient Medications on File Prior to Visit  Medication Sig Dispense Refill  . acetaminophen  (TYLENOL ) 325 MG tablet Take 1-2 tablets (325-650 mg total) by mouth every 6 (six) hours as needed for mild pain (pain score 1-3 or temp > 100.5). 40 tablet 1  . cyanocobalamin  (VITAMIN B12) 1000 MCG tablet Take 1,000 mcg by mouth daily.    . ELIQUIS  5 MG TABS tablet Take 1 tablet (5 mg total) by mouth 2 (two) times daily. 60 tablet 5  . famotidine  (PEPCID ) 20 MG tablet Take 1 tablet (20 mg total) by mouth 2 (two) times daily. For itching 180 tablet 2  . fluticasone  (FLONASE ) 50 MCG/ACT nasal spray Place 2 sprays into both nostrils daily. 16 g 6  . fluticasone -salmeterol (ADVAIR ) 100-50 MCG/ACT AEPB Inhale 1 puff into the lungs 2 (two) times daily as needed. 180 each 1  . Iron , Ferrous Sulfate , 325 (65 Fe) MG TABS Take 325 mg by mouth daily. 30 tablet 2  . losartan  (COZAAR ) 100 MG tablet TAKE 1 TABLET BY MOUTH DAILY 30 tablet 11  . metoprolol  succinate (TOPROL -XL) 25 MG 24 hr tablet TAKE 1 TABLET BY MOUTH AT  BEDTIME 100 tablet 2  . pantoprazole  (PROTONIX ) 40 MG tablet Take 1 tablet (40 mg total) by mouth daily. For gastritis 100 tablet 3  . rosuvastatin  (CRESTOR ) 10 MG tablet TAKE 1 TABLET BY MOUTH DAILY 100 tablet 2  . sertraline  (ZOLOFT ) 100 MG tablet TAKE 1 TABLET BY MOUTH DAILY 90 tablet 3  . VITAMIN D  PO Take 1,000 Int'l Units/day by mouth daily.    . fexofenadine  (ALLEGRA ) 180 MG tablet Take 1 tablet (180 mg total) by  mouth 2 (two) times daily. For itching (Patient not taking: Reported on 07/19/2023) 180 tablet 2   No current facility-administered medications on file prior to visit.    BP (!) 175/78 (BP Location: Left Arm, Patient Position: Sitting, Cuff Size: Normal)   Pulse 69   Temp 97.9 F (36.6 C) (Oral)   Wt 177 lb (80.3 kg)   SpO2 98%   BMI 30.38 kg/m chart      Objective:    BP (!) 175/78 (BP Location: Left Arm, Patient Position: Sitting, Cuff Size: Normal)   Pulse 69   Temp 97.9 F (36.6 C) (Oral)   Wt 177 lb (80.3 kg)   SpO2 98%   BMI 30.38 kg/m    Physical Exam Vitals reviewed.  Constitutional:      Appearance: Normal appearance. She is normal weight.  HENT:     Head: Normocephalic and atraumatic.     Right Ear: Tympanic membrane, ear canal and external ear normal.     Left Ear: Tympanic membrane, ear canal and external ear normal.  Cardiovascular:     Rate and Rhythm: Normal rate and regular rhythm.  Pulmonary:     Effort: Pulmonary effort is normal.     Breath sounds: Normal breath sounds.  Abdominal:     General: Abdomen is flat. Bowel sounds are normal.     Palpations: Abdomen is soft.  Musculoskeletal:        General: Normal range of motion.     Cervical back: Normal range of motion and neck supple.  Skin:    General: Skin is warm and dry.  Neurological:     General: No focal deficit present.     Mental Status: She is alert and oriented  to person, place, and time. Mental status is at baseline.  Psychiatric:        Thought Content: Thought content normal.    No results found for any visits on 11/15/23.      Assessment & Plan:   Problem List Items Addressed This Visit     Essential hypertension   Relevant Medications   amLODipine  (NORVASC ) 5 MG tablet   Other Visit Diagnoses       Need for influenza vaccination    -  Primary   Relevant Orders   Flu vaccine HIGH DOSE PF(Fluzone Trivalent) (Completed)       Meds ordered this encounter   Medications  . amLODipine  (NORVASC ) 5 MG tablet    Sig: Take 1 tablet (5 mg total) by mouth daily.    Dispense:  30 tablet    Refill:  2   Call the office if symptoms worsen or persist. Increase amlodipine  to 5 milligrams once daily. Recheck in 2-4 weeks.  Return in about 4 weeks (around 12/13/2023).  Alif Petrak B Jaimen Melone, FNP

## 2023-11-20 ENCOUNTER — Emergency Department

## 2023-11-20 ENCOUNTER — Emergency Department
Admission: EM | Admit: 2023-11-20 | Discharge: 2023-11-20 | Disposition: A | Discharge status: Home / Self Care | Attending: Emergency Medicine | Admitting: Emergency Medicine

## 2023-11-20 DIAGNOSIS — M79601 Pain in right arm: Secondary | ICD-10-CM | POA: Insufficient documentation

## 2023-11-20 DIAGNOSIS — N189 Chronic kidney disease, unspecified: Secondary | ICD-10-CM | POA: Diagnosis not present

## 2023-11-20 DIAGNOSIS — I480 Paroxysmal atrial fibrillation: Secondary | ICD-10-CM | POA: Insufficient documentation

## 2023-11-20 DIAGNOSIS — W19XXXA Unspecified fall, initial encounter: Secondary | ICD-10-CM | POA: Diagnosis not present

## 2023-11-20 DIAGNOSIS — S0093XA Contusion of unspecified part of head, initial encounter: Secondary | ICD-10-CM | POA: Diagnosis not present

## 2023-11-20 DIAGNOSIS — I6521 Occlusion and stenosis of right carotid artery: Secondary | ICD-10-CM | POA: Diagnosis not present

## 2023-11-20 DIAGNOSIS — Z743 Need for continuous supervision: Secondary | ICD-10-CM | POA: Diagnosis not present

## 2023-11-20 DIAGNOSIS — M79602 Pain in left arm: Secondary | ICD-10-CM | POA: Diagnosis not present

## 2023-11-20 DIAGNOSIS — R55 Syncope and collapse: Secondary | ICD-10-CM | POA: Diagnosis not present

## 2023-11-20 DIAGNOSIS — M5020 Other cervical disc displacement, unspecified cervical region: Secondary | ICD-10-CM | POA: Diagnosis not present

## 2023-11-20 DIAGNOSIS — R609 Edema, unspecified: Secondary | ICD-10-CM | POA: Diagnosis not present

## 2023-11-20 DIAGNOSIS — I129 Hypertensive chronic kidney disease with stage 1 through stage 4 chronic kidney disease, or unspecified chronic kidney disease: Secondary | ICD-10-CM | POA: Diagnosis not present

## 2023-11-20 DIAGNOSIS — I672 Cerebral atherosclerosis: Secondary | ICD-10-CM | POA: Diagnosis not present

## 2023-11-20 DIAGNOSIS — Z7901 Long term (current) use of anticoagulants: Secondary | ICD-10-CM | POA: Diagnosis not present

## 2023-11-20 DIAGNOSIS — S0990XA Unspecified injury of head, initial encounter: Secondary | ICD-10-CM | POA: Diagnosis not present

## 2023-11-20 LAB — CBC WITH DIFFERENTIAL/PLATELET
Abs Immature Granulocytes: 0.01 K/uL (ref 0.00–0.07)
Basophils Absolute: 0 K/uL (ref 0.0–0.1)
Basophils Relative: 1 %
Eosinophils Absolute: 0.2 K/uL (ref 0.0–0.5)
Eosinophils Relative: 3 %
HCT: 35.2 % — ABNORMAL LOW (ref 36.0–46.0)
Hemoglobin: 12 g/dL (ref 12.0–15.0)
Immature Granulocytes: 0 %
Lymphocytes Relative: 22 %
Lymphs Abs: 1.3 K/uL (ref 0.7–4.0)
MCH: 30.4 pg (ref 26.0–34.0)
MCHC: 34.1 g/dL (ref 30.0–36.0)
MCV: 89.1 fL (ref 80.0–100.0)
Monocytes Absolute: 0.5 K/uL (ref 0.1–1.0)
Monocytes Relative: 9 %
Neutro Abs: 3.7 K/uL (ref 1.7–7.7)
Neutrophils Relative %: 65 %
Platelets: 186 K/uL (ref 150–400)
RBC: 3.95 MIL/uL (ref 3.87–5.11)
RDW: 14.9 % (ref 11.5–15.5)
WBC: 5.7 K/uL (ref 4.0–10.5)
nRBC: 0 % (ref 0.0–0.2)

## 2023-11-20 LAB — PROTIME-INR
INR: 1.2 (ref 0.8–1.2)
Prothrombin Time: 15.7 s — ABNORMAL HIGH (ref 11.4–15.2)

## 2023-11-20 LAB — BASIC METABOLIC PANEL WITH GFR
Anion gap: 12 (ref 5–15)
BUN: 24 mg/dL — ABNORMAL HIGH (ref 8–23)
CO2: 22 mmol/L (ref 22–32)
Calcium: 9.7 mg/dL (ref 8.9–10.3)
Chloride: 106 mmol/L (ref 98–111)
Creatinine, Ser: 1.01 mg/dL — ABNORMAL HIGH (ref 0.44–1.00)
GFR, Estimated: 54 mL/min — ABNORMAL LOW (ref >60)
Glucose, Bld: 112 mg/dL — ABNORMAL HIGH (ref 70–99)
Potassium: 4.1 mmol/L (ref 3.5–5.1)
Sodium: 140 mmol/L (ref 135–145)

## 2023-11-20 LAB — CK: Total CK: 129 U/L (ref 38–234)

## 2023-11-20 LAB — APTT: aPTT: 35 s (ref 24–36)

## 2023-11-20 NOTE — Discharge Instructions (Addendum)
 Your CT CT scan is showing a significant narrowing of your right carotid artery in your neck.  This will need to be followed up with a carotid ultrasound.  This can be ordered by your primary care doctor.  Return to the ER for new, worsening, recurrent episodes of dizziness, falls, weakness or numbness on one side, other strokelike symptoms, severe headache, or any other new or worsening symptoms that concern you.

## 2023-11-20 NOTE — ED Provider Notes (Signed)
 [facility] Provider Note    Event Date/Time   First MD Initiated Contact with Patient 11/20/23 [phone]     (approximate)   History   Fall   HPI  Jamie Anderson is a 88 y.o. female with a history of hypertension, paroxysmal A-fib on Eliquis , and CKD who presents after a fall.  The patient states that she got up from bed to go to the bathroom, started to feel lightheaded, and then had a near syncope, hitting her head when she fell.  She did not lose consciousness.  She reports relatively frequent similar episodes of dizziness often when she gets up quickly which she believes are related to her blood pressure medication.  This is not new.  She reports feeling some soreness in both arms but no pain in her neck or back and no pain in her hips or lower legs.  She does not think that she seriously injured herself.  She is able to move all of her extremities.  She is no longer feeling lightheaded.  She denies any chest pain, difficulty breathing, or headache.  I reviewed the past medical records.  The patient's most recent outpatient encounter was on 9/9 with family medicine for evaluation of elevated blood pressure.   Physical Exam   Triage Vital Signs: ED Triage Vitals  Encounter Vitals Group     BP 11/20/23 0700 (!) 159/73     Girls Systolic BP Percentile --      Girls Diastolic BP Percentile --      Boys Systolic BP Percentile --      Boys Diastolic BP Percentile --      Pulse Rate 11/20/23 0700 66     Resp 11/20/23 0700 14     Temp 11/20/23 0700 98.2 F (36.8 C)     Temp Source 11/20/23 0700 Oral     SpO2 11/20/23 0700 100 %     Weight 11/20/23 0705 178 lb (80.7 kg)     Height 11/20/23 0705 5' 5 (1.651 m)     Head Circumference --      Peak Flow --      Pain Score 11/20/23 0705 5     Pain Loc --      Pain Education --      Exclude from Growth Chart --     Most recent vital signs: Vitals:   11/20/23 0730 11/20/23 0915  BP: (!) 187/65 (!) 158/64   Pulse:  70  Resp:  14  Temp:    SpO2:  96%     General: Alert, well-appearing, no distress.  CV:  Good peripheral perfusion.  Resp:  Normal effort.  Abd:  No distention.  Other:  EOMI.  PERRLA.  No facial droop.  Normal speech.  Motor and sensory intact in all extremities.  No ataxia on finger to nose.  Full range of motion of bilateral hips, knees, shoulders and elbows.   ED Results / Procedures / Treatments   Labs (all labs ordered are listed, but only abnormal results are displayed) Labs Reviewed  CBC WITH DIFFERENTIAL/PLATELET - Abnormal; Notable for the following components:      Result Value   HCT 35.2 (*)    All other components within normal limits  BASIC METABOLIC PANEL WITH GFR - Abnormal; Notable for the following components:   Glucose, Bld 112 (*)    BUN 24 (*)    Creatinine, Ser 1.01 (*)    GFR, Estimated 54 (*)  All other components within normal limits  PROTIME-INR - Abnormal; Notable for the following components:   Prothrombin Time 15.7 (*)    All other components within normal limits  CK  APTT     EKG  ED ECG REPORT I, Waylon Cassis, the attending physician, personally viewed and interpreted this ECG.  Date: 11/20/2023 EKG Time: 0725 Rate: 69 Rhythm: normal sinus rhythm QRS Axis: normal Intervals: normal ST/T Wave abnormalities: normal Narrative Interpretation: no evidence of acute ischemia    RADIOLOGY  CT head/cervical spine: I independently viewed and interpreted the images; there is no ICH.  Radiology report indicates no acute intracranial abnormality and no acute cervical spine fracture.  PROCEDURES:  Critical Care performed: No  Procedures   MEDICATIONS ORDERED IN ED: Medications - No data to display   IMPRESSION / MDM / ASSESSMENT AND PLAN / ED COURSE  I reviewed the triage vital signs and the nursing notes.  88 year old female with PMH as noted above presents with an episode of orthostatic near syncope with a  resulting fall and head injury.  Vital signs are normal.  The patient is well-appearing.  Thorough neurologic exam is nonfocal.  There are no concerning traumatic findings.  Differential diagnosis includes, but is not limited to, minor head injury, concussion, TBI.  Differential for the near syncope includes vasovagal episode or orthostatic hypotension.  There is no evidence of cardiac cause.  We will obtain CT head and cervical spine, basic labs, CK, EKG, and reassess.   Patient's presentation is most consistent with acute presentation with potential threat to life or bodily function.  The patient is on the cardiac monitor to evaluate for evidence of arrhythmia and/or significant heart rate changes.  ----------------------------------------- 10:14 AM on 11/20/2023 -----------------------------------------  CT head and cervical spine are negative for acute findings.  Labs are unremarkable.  EKG is nonischemic.  The CT does show findings of a likely right ICA stenosis.  However, the patient is not having any strokelike symptoms or other indication for emergent workup of this.  I advised her on this finding and have also included in her written discharge paperwork to have it followed by her primary care provider for an outpatient carotid ultrasound.  At this time, the patient is stable for discharge home.  I gave strict return precautions, and she expressed understanding.   FINAL CLINICAL IMPRESSION(S) / ED DIAGNOSES   Final diagnoses:  Fall, initial encounter  Near syncope  Minor head injury, initial encounter     Rx / DC Orders   ED Discharge Orders     None        Note:  This document was prepared using Dragon voice recognition software and may include unintentional dictation errors.    Cassis Waylon, MD 11/20/23 1015

## 2023-11-20 NOTE — ED Notes (Signed)
 Pt ambulatory to restroom x1 assistance at this time.

## 2023-11-20 NOTE — ED Triage Notes (Signed)
 Pt arrived by ems d/t mechanical fall on carpet where pt is unsure of downtime - at least an hour per pt. Pt on eliquis . No loc. Hematoma noticed on right forearm and pt states that she hit head - no hematoma on head but pt is complaining of headache.

## 2023-11-21 ENCOUNTER — Telehealth: Payer: Self-pay

## 2023-11-21 DIAGNOSIS — I6521 Occlusion and stenosis of right carotid artery: Secondary | ICD-10-CM

## 2023-11-21 NOTE — Telephone Encounter (Signed)
 Carotid ultrasound ordered,  to be done at Cardiology office

## 2023-11-21 NOTE — Telephone Encounter (Signed)
 Copied from CRM #[id number]. Topic: Clinical - Request for Lab/Test Order >> Nov 21, 2023  8:24 AM Berneda FALCON wrote: Reason for CRM: During ER Visit on 9/14, it was discovered that patient has significant narrowing of the right carotid artery, and suggested an ultrasound for this requested by the PCP.  When this order is placed, please call patient to let her know. Can call Rock (daughter) as well. >> Nov 21, 2023  8:27 AM Berneda FALCON wrote: Daughter's name is Holley, not Rock. Sorry for the mistake on my end.

## 2023-11-21 NOTE — Addendum Note (Signed)
 Addended by: MARYLYNN VERNEITA CROME on: 11/21/2023 05:51 PM   Modules accepted: Orders

## 2023-11-22 NOTE — Telephone Encounter (Signed)
 Pt's daughter is aware that order has been placed and someone will contact her to schedule the appt.

## 2023-11-23 ENCOUNTER — Ambulatory Visit: Payer: Self-pay

## 2023-11-23 DIAGNOSIS — M25562 Pain in left knee: Secondary | ICD-10-CM | POA: Diagnosis not present

## 2023-11-23 NOTE — Telephone Encounter (Signed)
 Attempted to call pt. No answer. No voicemail. Please see if pt would like to be seen if another provider here in the office this week to be evaluated.

## 2023-11-23 NOTE — Telephone Encounter (Signed)
 FYI Only or Action Required?: Action required by provider: Requesting medication sent in for dizziness.  Patient was last seen in primary care on 11/15/2023 by Douglass Kenney NOVAK, FNP.  Called Nurse Triage reporting Dizziness.  Symptoms began several years ago.  Interventions attempted: Nothing.  Symptoms are: gradually worsening.  Triage Disposition: See PCP Within 2 Weeks  Patient/caregiver understands and will follow disposition?: Yes      Copied from CRM [id number]. Topic: Clinical - Red Word Triage >> Nov 23, 2023 10:59 AM Jamie Anderson wrote: Red Word that prompted transfer to Nurse Triage: Pt was dizzy on Sunday and fell. She was seen in the ER and they didn't find any head injuries or broken bones. Pt is still feeling lightheaded, dizzy, and has intermittent pain in her head. Warm transfer to nurse triage. Reason for Disposition  Dizziness is a chronic symptom (recurrent or ongoing AND present > 4 weeks)  Answer Assessment - Initial Assessment Questions Patient states she has a hx of Meniere's disease.States symptoms improved yesterday but have returned. Patient states she feels ok overall but is using a walker and has help getting around to appointments. Patient requesting a medication sent in for dizziness. Patient has a follow up appointment scheduled with PCP already. Patient requesting a callback regarding if medication is sent in.   [facility] [place], [place] - [address]  [address], [place] [address]     1. DESCRIPTION: Describe your dizziness.     Fells lightheaded when getting up quickly. Pt states she does not trust her self to walk without her walker  2. LIGHTHEADED: Do you feel lightheaded? (e.g., somewhat faint, woozy, weak upon standing)     Feels lightheaded when standing to fast  3. VERTIGO: Do you feel like either you or the room is spinning or tilting? (i.e., vertigo)     Denies 4. SEVERITY: How bad is it?  Do you feel like you are  going to faint? Can you stand and walk?     Can stand and get up with walker.  5. ONSET:  When did the dizziness begin?     On and off for years  6. AGGRAVATING FACTORS: Does anything make it worse? (e.g., standing, change in head position)     Standing and moving head in a certain direction  7. HEART RATE: Can you tell me your heart rate? How many beats in 15 seconds?  (Note: Not all patients can do this.)       Denies heart palpations  9. RECURRENT SYMPTOM: Have you had dizziness before? If Yes, ask: When was the last time? What happened that time?     Yes, had a fall on 11/20/23 10. OTHER SYMPTOMS: Do you have any other symptoms? (e.g., fever, chest pain, vomiting, diarrhea, bleeding)       R side Headache that comes and goes states it is tolerable.  Protocols used: Dizziness - Lightheadedness-A-AH

## 2023-11-24 ENCOUNTER — Ambulatory Visit (INDEPENDENT_AMBULATORY_CARE_PROVIDER_SITE_OTHER)

## 2023-11-24 ENCOUNTER — Other Ambulatory Visit: Payer: Self-pay | Admitting: Cardiovascular Disease

## 2023-11-24 ENCOUNTER — Other Ambulatory Visit: Payer: Self-pay | Admitting: Internal Medicine

## 2023-11-24 DIAGNOSIS — I6521 Occlusion and stenosis of right carotid artery: Secondary | ICD-10-CM | POA: Diagnosis not present

## 2023-11-24 DIAGNOSIS — I480 Paroxysmal atrial fibrillation: Secondary | ICD-10-CM

## 2023-11-24 MED ORDER — MECLIZINE HCL 12.5 MG PO TABS: 12.5000 mg | ORAL_TABLET | Freq: Three times a day (TID) | ORAL | 2 refills | Status: DC | PRN

## 2023-11-24 NOTE — Telephone Encounter (Signed)
 Prescription refill request for Eliquis  received. Indication:afib Last office visit:10/24 Scr:1.01  9/25 Age: 88 Weight:80.7  kg  Prescription refilled

## 2023-11-24 NOTE — Telephone Encounter (Signed)
Pt notified  Pt verbalized understanding

## 2023-11-26 ENCOUNTER — Ambulatory Visit: Payer: Self-pay | Admitting: Internal Medicine

## 2023-12-02 ENCOUNTER — Encounter: Payer: Self-pay | Admitting: Internal Medicine

## 2023-12-02 ENCOUNTER — Ambulatory Visit (INDEPENDENT_AMBULATORY_CARE_PROVIDER_SITE_OTHER): Admitting: Internal Medicine

## 2023-12-02 ENCOUNTER — Ambulatory Visit: Attending: Internal Medicine

## 2023-12-02 VITALS — BP 140/64 | HR 70 | Ht 65.0 in | Wt 175.6 lb

## 2023-12-02 DIAGNOSIS — R296 Repeated falls: Secondary | ICD-10-CM | POA: Diagnosis not present

## 2023-12-02 DIAGNOSIS — I6523 Occlusion and stenosis of bilateral carotid arteries: Secondary | ICD-10-CM

## 2023-12-02 DIAGNOSIS — D6869 Other thrombophilia: Secondary | ICD-10-CM

## 2023-12-02 DIAGNOSIS — I480 Paroxysmal atrial fibrillation: Secondary | ICD-10-CM

## 2023-12-02 DIAGNOSIS — Z9181 History of falling: Secondary | ICD-10-CM

## 2023-12-02 DIAGNOSIS — D509 Iron deficiency anemia, unspecified: Secondary | ICD-10-CM | POA: Diagnosis not present

## 2023-12-02 DIAGNOSIS — H8109 Meniere's disease, unspecified ear: Secondary | ICD-10-CM | POA: Diagnosis not present

## 2023-12-02 DIAGNOSIS — I6529 Occlusion and stenosis of unspecified carotid artery: Secondary | ICD-10-CM | POA: Insufficient documentation

## 2023-12-02 MED ORDER — PANTOPRAZOLE SODIUM 40 MG PO TBEC: 40.0000 mg | DELAYED_RELEASE_TABLET | Freq: Every day | ORAL | 3 refills | Status: DC

## 2023-12-02 MED ORDER — FAMOTIDINE 20 MG PO TABS: 20.0000 mg | ORAL_TABLET | Freq: Two times a day (BID) | ORAL | 2 refills | Status: DC

## 2023-12-02 MED ORDER — TRIAMTERENE-HCTZ 37.5-25 MG PO TABS: 1.0000 | ORAL_TABLET | Freq: Every day | ORAL | 3 refills | Status: AC

## 2023-12-02 MED ORDER — FLUTICASONE-SALMETEROL 100-50 MCG/ACT IN AEPB: 1.0000 | INHALATION_SPRAY | Freq: Two times a day (BID) | RESPIRATORY_TRACT | 1 refills | Status: AC | PRN

## 2023-12-02 MED ORDER — METOPROLOL SUCCINATE ER 25 MG PO TB24: 25.0000 mg | ORAL_TABLET | Freq: Every day | ORAL | 2 refills | Status: DC

## 2023-12-02 NOTE — Assessment & Plan Note (Addendum)
 Stopping Eliquis  today given her increased risk of cerebral bleed due to recurrent falls (May 2025, Sept 2025)

## 2023-12-02 NOTE — Assessment & Plan Note (Signed)
 She has had a second fall at home due to vertigo.  Stopping eliquis  today. Home health PT ordered

## 2023-12-02 NOTE — Assessment & Plan Note (Signed)
 She is intolerant of meclizine  due to excessive sedation.  Starting triamterene voncile

## 2023-12-02 NOTE — Patient Instructions (Addendum)
 You can stop taking ferrous sulfate  (iron )  I am starting you on triamterene  /hydrochlorothiazide  to prevent meniere's  attacks. Take it once daily in the morning    Stop the Eliquis  . It 's too dangerous to continue this medication    I am ordering a ZIO monitor for you to wear for 2 weeks to monitor your heart rhythms .  It will come to your house and you can apply it to your chest wall.  If you have any trouble applying it you can schedule a RN visit in our office and we will help you py  I am also ordering Home health physical therapy to work on your balance and strength   I also  recommend getting a LIFE ALERT

## 2023-12-02 NOTE — Assessment & Plan Note (Signed)
 Resolved . .advised to stop the ferrous sulfate 

## 2023-12-02 NOTE — Progress Notes (Signed)
 Subjective:  Patient ID: Jamie Anderson, female    DOB: 02-17-1936  Age: 88 y.o. MRN: [id number]  CC: The primary encounter diagnosis was Bilateral carotid artery stenosis without cerebral infarction. Diagnoses of Paroxysmal A-fib (HCC), History of recent fall, Iron  deficiency anemia, unspecified iron  deficiency anemia type, Acquired thrombophilia, Meniere's disease, unspecified laterality, and Recurrent falls while walking were also pertinent to this visit.   HPI Jamie Anderson presents for  Chief Complaint  Patient presents with   Hospitalization Follow-up    ER follow up: Lidie IS AN 88 YR OLD FEMALE WITH HYPERTENSION, PAROXYSMAL ATRIAL FIBRILLATION (on anticoagulation with Eliquis )  AND Menieres Disease who  was evaluated in ER for  injuries secondary to an unwitnessed  fall that occurred on Sept 14 when she got out of bed and  became acutely dizzy on the way to the bathroom.  The patient was unable to get up on her own was taken to [facility] via  EMS.  She does not wear a life alert and lives alone.  She underwent imaging with CT head and CT spine  which were negative for bleeds and fractures but suggested Right carotid artery stenosis  She was noted to  be hypertensive in the ER.  No orthostatic measurements were taken.  She was not anemic   Orthstatic vital signs in the office were done today and she is NOT orthostatic ,  but did report vertigo  with position change from lying to sitting.    Her previous mechanical fall occurred  in May 2025  and resulted in a head injury  but was not evaluated in ER     Outpatient Medications Prior to Visit  Medication Sig Dispense Refill   acetaminophen  (TYLENOL ) 325 MG tablet Take 1-2 tablets (325-650 mg total) by mouth every 6 (six) hours as needed for mild pain (pain score 1-3 or temp > 100.5). 40 tablet 1   amLODipine  (NORVASC ) 5 MG tablet Take 1 tablet (5 mg total) by mouth daily. 30 tablet 2   apixaban  (ELIQUIS ) 5 MG TABS tablet Take 1 tablet  (5 mg total) by mouth 2 (two) times daily. 60 tablet 5   cyanocobalamin  (VITAMIN B12) 1000 MCG tablet Take 1,000 mcg by mouth daily.     fluticasone  (FLONASE ) 50 MCG/ACT nasal spray Place 2 sprays into both nostrils daily. 16 g 6   Iron , Ferrous Sulfate , 325 (65 Fe) MG TABS Take 325 mg by mouth daily. 30 tablet 2   losartan  (COZAAR ) 100 MG tablet TAKE 1 TABLET BY MOUTH DAILY 30 tablet 11   meclizine  (ANTIVERT ) 12.5 MG tablet Take 1 tablet (12.5 mg total) by mouth 3 (three) times daily as needed for dizziness. 60 tablet 2   rosuvastatin  (CRESTOR ) 10 MG tablet TAKE 1 TABLET BY MOUTH DAILY 100 tablet 2   sertraline  (ZOLOFT ) 100 MG tablet TAKE 1 TABLET BY MOUTH DAILY 90 tablet 3   VITAMIN D  PO Take 1,000 Int'l Units/day by mouth daily.     famotidine  (PEPCID ) 20 MG tablet Take 1 tablet (20 mg total) by mouth 2 (two) times daily. For itching 180 tablet 2   fluticasone -salmeterol (ADVAIR ) 100-50 MCG/ACT AEPB Inhale 1 puff into the lungs 2 (two) times daily as needed. 180 each 1   metoprolol  succinate (TOPROL -XL) 25 MG 24 hr tablet TAKE 1 TABLET BY MOUTH AT  BEDTIME 100 tablet 2   pantoprazole  (PROTONIX ) 40 MG tablet Take 1 tablet (40 mg total) by mouth daily. For gastritis 100 tablet  3   No facility-administered medications prior to visit.    Review of Systems;  Patient denies headache, fevers, malaise, unintentional weight loss, skin rash, eye pain, sinus congestion and sinus pain, sore throat, dysphagia,  hemoptysis , cough, dyspnea, wheezing, chest pain, palpitations, orthopnea, edema, abdominal pain, nausea, melena, diarrhea, constipation, flank pain, dysuria, hematuria, urinary  Frequency, nocturia, numbness, tingling, seizures,  Focal weakness, Loss of consciousness,  Tremor, insomnia, depression, anxiety, and suicidal ideation.      Objective:  BP (!) 140/64   Pulse 70   Ht 5' 5 (1.651 m)   Wt 175 lb 9.6 oz (79.7 kg)   SpO2 97%   BMI 29.22 kg/m   BP Readings from Last 3 Encounters:   12/02/23 (!) 140/64  11/20/23 (!) 155/67  11/15/23 (!) 175/78    Wt Readings from Last 3 Encounters:  12/02/23 175 lb 9.6 oz (79.7 kg)  11/20/23 178 lb (80.7 kg)  11/15/23 177 lb (80.3 kg)    Physical Exam Vitals reviewed.  Constitutional:      General: She is not in acute distress.    Appearance: Normal appearance. She is normal weight. She is not ill-appearing, toxic-appearing or diaphoretic.  HENT:     Head: Normocephalic.  Eyes:     General: No scleral icterus.       Right eye: No discharge.        Left eye: No discharge.     Conjunctiva/sclera: Conjunctivae normal.  Cardiovascular:     Rate and Rhythm: Normal rate and regular rhythm.     Heart sounds: Normal heart sounds.  Pulmonary:     Effort: Pulmonary effort is normal. No respiratory distress.     Breath sounds: Normal breath sounds.  Musculoskeletal:        General: Normal range of motion.  Skin:    General: Skin is warm and dry.  Neurological:     General: No focal deficit present.     Mental Status: She is alert and oriented to person, place, and time. Mental status is at baseline.  Psychiatric:        Mood and Affect: Mood normal.        Behavior: Behavior normal.        Thought Content: Thought content normal.        Judgment: Judgment normal.     Lab Results  Component Value Date   HGBA1C 5.6 07/19/2022   HGBA1C 5.5 01/30/2018   HGBA1C 5.4 01/15/2015    Lab Results  Component Value Date   CREATININE 1.01 (H) 11/20/2023   CREATININE 1.02 07/20/2023   CREATININE 1.16 12/30/2022    Lab Results  Component Value Date   WBC 5.7 11/20/2023   HGB 12.0 11/20/2023   HCT 35.2 (L) 11/20/2023   PLT 186 11/20/2023   GLUCOSE 112 (H) 11/20/2023   CHOL 237 (H) 07/20/2023   TRIG 87.0 07/20/2023   HDL 67.40 07/20/2023   LDLDIRECT 143.0 07/20/2023   LDLCALC 152 (H) 07/20/2023   ALT 13 07/20/2023   AST 17 07/20/2023   NA 140 11/20/2023   K 4.1 11/20/2023   CL 106 11/20/2023   CREATININE 1.01 (H)  11/20/2023   BUN 24 (H) 11/20/2023   CO2 22 11/20/2023   TSH 1.41 07/20/2023   INR 1.2 11/20/2023   HGBA1C 5.6 07/19/2022   MICROALBUR 1.5 09/27/2022    CT Cervical Spine Wo Contrast Result Date: 11/20/2023 CLINICAL DATA:  88 year old female status post unwitnessed fall at home, found down.  On Eliquis . Struck head, hematoma. EXAM: CT CERVICAL SPINE WITHOUT CONTRAST TECHNIQUE: Multidetector CT imaging of the cervical spine was performed without intravenous contrast. Multiplanar CT image reconstructions were also generated. RADIATION DOSE REDUCTION: This exam was performed according to the departmental dose-optimization program which includes automated exposure control, adjustment of the mA and/or kV according to patient size and/or use of iterative reconstruction technique. COMPARISON:  Head CT today.  Cervical spine MRI 11/14/2018. FINDINGS: Alignment: Straightening of cervical lordosis is chronic and stable. Cervicothoracic junction alignment is within normal limits. Bilateral posterior element alignment is within normal limits. Skull base and vertebrae: Bone mineralization is within normal limits. Visualized skull base is intact. No atlanto-occipital dissociation. C1 and C2 are chronically degenerated but intact and aligned. No acute osseous abnormality identified. Soft tissues and spinal canal: No prevertebral fluid or swelling. No visible canal hematoma. Bulky cervical carotid calcified atherosclerosis, including evidence of a radiographic string sign on the right series 4, image 40. Otherwise negative visible noncontrast neck soft tissues. Disc levels: Bulky and heavily calcified ligamentous hypertrophy about the odontoid. Advanced chronic anterior C1-odontoid degeneration including vacuum phenomena. Similar extensive degenerative calcification of cervical intervertebral discs (C2-C3 through C4-C5) and intermittently affecting the ligament flavum (C2-C3). Advanced chronic disc and bulky endplate  spurring in the lower cervical spine. Multilevel mild spinal stenosis suspected. Upper chest: Visible upper thoracic levels appear intact. Mild apical lung scarring. IMPRESSION: 1. No acute traumatic injury identified in the cervical spine. 2. Advanced cervical spine degeneration, especially calcified bulky ligamentous hypertrophy, calcified disc bulging. 3. Advanced calcified atherosclerosis, including noncontrast evidence of a Right ICA Radiographic-String-Sign stenosis. Carotid Doppler Ultrasound would be the simplest way to evaluate further. Electronically Signed   By: VEAR Hurst M.D.   On: 11/20/2023 08:16   CT Head Wo Contrast Result Date: 11/20/2023 CLINICAL DATA:  88 year old female status post unwitnessed fall at home, found down. On Eliquis . Struck head, hematoma. EXAM: CT HEAD WITHOUT CONTRAST TECHNIQUE: Contiguous axial images were obtained from the base of the skull through the vertex without intravenous contrast. RADIATION DOSE REDUCTION: This exam was performed according to the departmental dose-optimization program which includes automated exposure control, adjustment of the mA and/or kV according to patient size and/or use of iterative reconstruction technique. COMPARISON:  None Available. FINDINGS: Brain: Normal cerebral volume for age. No midline shift, ventriculomegaly, mass effect, evidence of mass lesion, intracranial hemorrhage or evidence of cortically based acute infarction. Minimal to mild for age patchy periventricular white matter hypodensity. Otherwise maintained gray-white differentiation. Vascular: Advanced Calcified atherosclerosis at the skull base. No suspicious intracranial vascular hyperdensity. Skull: Intact. No fracture identified. Advanced C1-C2 degeneration partially visible, cervical spine today reported separately. Sinuses/Orbits: Visualized paranasal sinuses and mastoids are clear. Other: No acute orbit or scalp soft tissue injury is identified. IMPRESSION: 1. No acute  traumatic injury identified. 2. Normal for age noncontrast CT appearance of the brain. 3. Advanced calcified atherosclerosis. Electronically Signed   By: VEAR Hurst M.D.   On: 11/20/2023 08:12    Assessment & Plan:  .Bilateral carotid artery stenosis without cerebral infarction Assessment & Plan: Carotid dopplers were done after her Sept 14 ER visit due to suggestion of R ICA stenosis on CT cervica spine (noncontrasted:   Bulky cervical carotid calcified atherosclerosis, including evidence of a radiographic string sign on the right series 4, image 40.  However   No hemodynamically significant stenosis was noted on the carotid dopplers that were done Sept 18.  Patient will continue rosuvastatin  .  Paroxysmal A-fib [facility]) Assessment & Plan:   She has been taking Eliquis  for embolic stroke risk mitigation due to  atrial fibrillation.However she continues to live alone and has had 2 mechanical falls int he past 5 months.  She is at risk for ICH with continued antiocoagulation.  Eliquis  is being stopped today.     Orders: -     LONG TERM MONITOR (3-14 DAYS); Future  History of recent fall Assessment & Plan: She has had a second fall at home due to vertigo.  Stopping eliquis  today. Home health PT ordered   Orders: -     Ambulatory referral to Home Health  Iron  deficiency anemia, unspecified iron  deficiency anemia type Assessment & Plan: Resolved . .advised to stop the ferrous sulfate     Acquired thrombophilia Assessment & Plan: Stopping Eliquis  today given her increased risk of cerebral bleed due to recurrent falls (May 2025, Sept 2025)    Meniere's disease, unspecified laterality Assessment & Plan: She is intolerant of meclizine  due to excessive sedation.  Starting triamterene /hct    Recurrent falls while walking Assessment & Plan: She has had a second fall at home due to vertigo.  Stopping eliquis  today. Home health PT ordered    Other orders -     Famotidine ; Take 1 tablet  (20 mg total) by mouth 2 (two) times daily. For itching  Dispense: 180 tablet; Refill: 2 -     Fluticasone -Salmeterol; Inhale 1 puff into the lungs 2 (two) times daily as needed.  Dispense: 180 each; Refill: 1 -     Metoprolol  Succinate ER; Take 1 tablet (25 mg total) by mouth at bedtime.  Dispense: 100 tablet; Refill: 2 -     Pantoprazole  Sodium; Take 1 tablet (40 mg total) by mouth daily. For gastritis  Dispense: 100 tablet; Refill: 3 -     Triamterene -HCTZ; Take 1 tablet by mouth daily.  Dispense: 90 tablet; Refill: 3   I personally spent a total of 40 minutes in the care of the patient today including preparing to see the patient, getting/reviewing separately obtained history, performing a medically appropriate exam/evaluation, counseling and educating, placing orders, referring and communicating with other health care professionals, and communicating results.  Follow-up: Return in about 3 months (around 03/02/2024).   Verneita LITTIE Kettering, MD

## 2023-12-02 NOTE — Assessment & Plan Note (Addendum)
 Carotid dopplers were done after her Sept 14 ER visit due to suggestion of R ICA stenosis on CT cervica spine (noncontrasted:   Bulky cervical carotid calcified atherosclerosis, including evidence of a radiographic string sign on the right series 4, image 40.  However   No hemodynamically significant stenosis was noted on the carotid dopplers that were done Sept 18.  Patient will continue rosuvastatin  .

## 2023-12-04 NOTE — Assessment & Plan Note (Signed)
 She has been taking Eliquis  for embolic stroke risk mitigation due to  atrial fibrillation.However she continues to live alone and has had 2 mechanical falls int he past 5 months.  She is at risk for ICH with continued antiocoagulation.  Eliquis  is being stopped today.

## 2023-12-05 ENCOUNTER — Other Ambulatory Visit: Payer: Self-pay | Admitting: Internal Medicine

## 2023-12-13 ENCOUNTER — Ambulatory Visit: Admitting: Internal Medicine

## 2023-12-21 ENCOUNTER — Other Ambulatory Visit: Payer: Self-pay | Admitting: Internal Medicine

## 2023-12-21 DIAGNOSIS — M1712 Unilateral primary osteoarthritis, left knee: Secondary | ICD-10-CM | POA: Diagnosis not present

## 2023-12-28 DIAGNOSIS — M1712 Unilateral primary osteoarthritis, left knee: Secondary | ICD-10-CM | POA: Diagnosis not present

## 2023-12-29 DIAGNOSIS — I480 Paroxysmal atrial fibrillation: Secondary | ICD-10-CM | POA: Diagnosis not present

## 2024-01-02 ENCOUNTER — Other Ambulatory Visit: Payer: Self-pay | Admitting: Internal Medicine

## 2024-01-02 DIAGNOSIS — I480 Paroxysmal atrial fibrillation: Secondary | ICD-10-CM

## 2024-01-02 DIAGNOSIS — E785 Hyperlipidemia, unspecified: Secondary | ICD-10-CM

## 2024-01-03 ENCOUNTER — Ambulatory Visit: Payer: Self-pay | Admitting: Internal Medicine

## 2024-01-04 ENCOUNTER — Telehealth: Payer: Self-pay

## 2024-01-04 ENCOUNTER — Ambulatory Visit (INDEPENDENT_AMBULATORY_CARE_PROVIDER_SITE_OTHER)

## 2024-01-04 ENCOUNTER — Ambulatory Visit: Payer: Self-pay

## 2024-01-04 VITALS — BP 108/52 | HR 81 | Temp 97.9°F | Ht 62.75 in | Wt 173.8 lb

## 2024-01-04 DIAGNOSIS — J069 Acute upper respiratory infection, unspecified: Secondary | ICD-10-CM

## 2024-01-04 LAB — POC COVID19 BINAXNOW: SARS Coronavirus 2 Ag: NEGATIVE

## 2024-01-04 LAB — POCT INFLUENZA A/B
Influenza A, POC: NEGATIVE
Influenza B, POC: NEGATIVE

## 2024-01-04 MED ORDER — PREDNISONE 20 MG PO TABS: 40.0000 mg | ORAL_TABLET | Freq: Every day | ORAL | 0 refills | Status: AC

## 2024-01-04 NOTE — Telephone Encounter (Signed)
 Spoke with pt and scheduled her to see Dr. Abbey today at 3pm.

## 2024-01-04 NOTE — Assessment & Plan Note (Signed)
 Acute upper respiratory infection with pharyngitis. Symptoms suggest viral etiology. COVID-19 and influenza tests negative in clinic.  Prescribed prednisone  40 mg once daily for 5 days, morning with food. Recommended acetaminophen  500 mg every 8 hours as needed for fever or pain. Encouraged hydration, rest, and humidifier use. Advised frequent hand washing and avoiding sick contacts. Instructed to use Advair  and rinse mouth after use. Advised Flonase  nasal spray, two puffs each nostril daily. Monitor for fever, chest pain, or difficulty breathing; seek emergency care if she occurs.

## 2024-01-04 NOTE — Progress Notes (Signed)
 Acute Office Visit  Subjective:    Patient ID: Jamie Anderson, female    DOB: 1935-06-15, 88 y.o.   MRN: [id number]  Chief Complaint  Patient presents with   Cough   Sore Throat   Hoarse   Nasal Congestion   Patient is in today for following acute concerns:  HPI Discussed the use of AI scribe software for clinical note transcription with the patient, who gave verbal consent to proceed.  History of Present Illness Jamie Anderson is an 88 year old female who presents with a sore throat and nasal congestion for two days. Uses Advair  and initially attributing it to not rinsing her mouth well after using her inhaler. This was followed by a runny nose and nasal congestion. She is concerned because she has had bronchitis in the past when she develops similar symptoms.  No recent exposure to individuals with COVID-19 or flu. She has not taken any over-the-counter medications for her current symptoms. She felt feverish the previous night but has not had any confirmed fevers.  She received her influenza vaccine during her last visit, which was not recent. She has not been around children or sick individuals recently.   ROS As per HPI    Objective:    BP (!) 108/52 (BP Location: Right Arm, Patient Position: Sitting, Cuff Size: Normal)   Pulse 81   Temp 97.9 F (36.6 C) (Oral)   Ht 5' 2.75 (1.594 m)   Wt 173 lb 12.8 oz (78.8 kg)   SpO2 96%   BMI 31.03 kg/m    Physical Exam Constitutional:      General: She is not in acute distress.    Appearance: She is obese.  HENT:     Head: Normocephalic and atraumatic.     Right Ear: Tympanic membrane normal.     Left Ear: Tympanic membrane normal.     Nose: Rhinorrhea (clear) present.     Mouth/Throat:     Mouth: Mucous membranes are moist.     Pharynx: Uvula midline. No oropharyngeal exudate or posterior oropharyngeal erythema.     Tonsils: No tonsillar exudate.  Cardiovascular:     Rate and Rhythm: Normal rate.  Pulmonary:      Effort: Pulmonary effort is normal.     Breath sounds: Normal breath sounds. No wheezing or rales.  Abdominal:     Tenderness: There is no abdominal tenderness. There is no guarding.  Lymphadenopathy:     Cervical: No cervical adenopathy.  Skin:    General: Skin is warm.  Neurological:     Mental Status: She is alert and oriented to person, place, and time.     Results for orders placed or performed in visit on 01/04/24  POC COVID-19 BinaxNow  Result Value Ref Range   SARS Coronavirus 2 Ag Negative Negative  POCT Influenza A/B  Result Value Ref Range   Influenza A, POC Negative Negative   Influenza B, POC Negative Negative       Assessment & Plan:   URI, acute Assessment & Plan: Acute upper respiratory infection with pharyngitis. Symptoms suggest viral etiology. COVID-19 and influenza tests negative in clinic.  Prescribed prednisone  40 mg once daily for 5 days, morning with food. Recommended acetaminophen  500 mg every 8 hours as needed for fever or pain. Encouraged hydration, rest, and humidifier use. Advised frequent hand washing and avoiding sick contacts. Instructed to use Advair  and rinse mouth after use. Advised Flonase  nasal spray, two puffs each nostril daily. Monitor  for fever, chest pain, or difficulty breathing; seek emergency care if she occurs.  Orders: -     POC COVID-19 BinaxNow -     POCT Influenza A/B -     predniSONE ; Take 2 tablets (40 mg total) by mouth daily with breakfast for 5 days.  Dispense: 10 tablet; Refill: 0    Return if symptoms worsen or fail to improve.  Luke Shade, MD

## 2024-01-04 NOTE — Telephone Encounter (Signed)
 Copied from CRM [id number]. Topic: Clinical - Medication Question >> Jan 04, 2024  8:42 AM Deaijah H wrote: Reason for CRM: Patient stated Dr. Tullo usually puts her on prednisone  due to have a tendency of getting bronchitis & would like to know if some may be sent in. Stated she's currently experiencing Sore throat/Cold, congest, runny nose. No appointment avail w/ PCP until 1/09   ----------------------------------------------------------------------- From previous Reason for Contact - Prescription Issue: Reason for CRM:    ----------------------------------------------------------------------- From previous Reason for Contact - Scheduling: Patient/patient representative is calling to schedule an appointment. Refer to attachments for appointment information.

## 2024-01-04 NOTE — Progress Notes (Signed)
 Discussed during office visit.  Luke Shade, MD

## 2024-01-04 NOTE — Patient Instructions (Addendum)
 Things to do that can help symptomatically at home:  Rest: Try to get plenty of rest. Take it easy and don't overdo it. Drink Fluids: Drink water, clear soups, or tea to stay hydrated. Eat Light: Eat small, easy-to-digest meals if you feel up to it. Fever or Aches: You can take Tylenol  (acetaminophen  500 mg every 8 hourly) for fever or body aches.  Stuffy Nose: Use a saline nasal spray or breathe in steam from a bowl of hot water to help with congestion.  Humidifier: Using a humidifier in your room can help keep the air moist and make breathing easier. Wash your hands often.  Start Prednisone  40 mg once daily.    Seek medical care if:  Trouble breathing or chest pain High fever that doesn't go down Confusion, trouble waking up, or new weakness Can't keep fluids down or aren't urinating as much Coughing up green, yellow, or bloody mucus Symptoms that last more than 10 days or get worse after starting to feel better

## 2024-01-31 ENCOUNTER — Ambulatory Visit: Admitting: *Deleted

## 2024-01-31 ENCOUNTER — Telehealth: Payer: Self-pay | Admitting: *Deleted

## 2024-01-31 VITALS — Ht 62.75 in | Wt 170.0 lb

## 2024-01-31 DIAGNOSIS — Z0000 Encounter for general adult medical examination without abnormal findings: Secondary | ICD-10-CM | POA: Diagnosis not present

## 2024-01-31 DIAGNOSIS — Z1231 Encounter for screening mammogram for malignant neoplasm of breast: Secondary | ICD-10-CM

## 2024-01-31 NOTE — Patient Instructions (Signed)
 Jamie Anderson,  Thank you for taking the time for your Medicare Wellness Visit. I appreciate your continued commitment to your health goals. Please review the care plan we discussed, and feel free to reach out if I can assist you further.  Please note that Annual Wellness Visits do not include a physical exam. Some assessments may be limited, especially if the visit was conducted virtually. If needed, we may recommend an in-person follow-up with your provider.  Ongoing Care Seeing your primary care provider every 3 to 6 months helps us  monitor your health and provide consistent, personalized care.  Remember to update your shingles and tetanus vaccines at your pharmacy.  You have an order for:  []   2D Mammogram  [x]   3D Mammogram  []   Bone Density     Please call for appointment:  [facility]  [address] Jamie Anderson [name] [address] [phone]     Make sure to wear two-piece clothing.  No lotions, powders, or deodorants the day of the appointment. Make sure to bring picture ID and insurance card.  Bring list of medications you are currently taking including any supplements.    Referrals If a referral was made during today's visit and you haven't received any updates within two weeks, please contact the referred provider directly to check on the status.  Recommended Screenings:  Health Maintenance  Topic Date Due   Zoster (Shingles) Vaccine (1 of 2) 10/22/1954   COVID-19 Vaccine (5 - 2025-26 season) 11/07/2023   DTaP/Tdap/Td vaccine (3 - Td or Tdap) 12/07/2023   Breast Cancer Screening  02/22/2024   Medicare Annual Wellness Visit  01/30/2025   Pneumococcal Vaccine for age over 71  Completed   Flu Shot  Completed   Osteoporosis screening with Bone Density Scan  Completed   Meningitis B Vaccine  Aged Out       01/31/2024    3:24 PM  Advanced Directives  Does Patient Have a Medical Advance Directive? Yes  Type of [employer] [place];Living will  Does patient want to make changes to medical advance directive? No - Patient declined  Copy of Healthcare Power of Attorney in Chart? No - copy requested    Vision: Annual vision screenings are recommended for early detection of glaucoma, cataracts, and diabetic retinopathy. These exams can also reveal signs of chronic conditions such as diabetes and high blood pressure.  Dental: Annual dental screenings help detect early signs of oral cancer, gum disease, and other conditions linked to overall health, including heart disease and diabetes.  Please see the attached documents for additional preventive care recommendations.

## 2024-01-31 NOTE — Progress Notes (Signed)
 Chief Complaint  Patient presents with   Medicare Wellness     Subjective:   Jamie Anderson is a 88 y.o. female who presents for a Medicare Annual Wellness Visit.  Allergies (verified) Almond meal (obsolete), Amoxicillin , Amoxicillin -pot clavulanate, Calcitriol, Clarithromycin, Colloidal oatmeal, Corn-containing products, Dust mite extract, Gluten meal, Molds & smuts, Oat, Other, and Shrimp [shellfish allergy]   History: Past Medical History:  Diagnosis Date   Acute deep vein thrombosis (DVT) of calf muscle vein of right lower extremity (HCC) 05/04/2021   Arthritis    Asthma    Basal cell carcinoma 01/17/2018   Left chest. Infiltrative. Excised 02/06/2018, margins free.   Basal cell carcinoma 12/14/2021   L nasal dorsum, Mohs [facility] 12/29/2021   Cystic teratoma of right ovary 11/23/2012   She underwent right salpingo-oophorectomy on April 1 by Dr. Arloa for removal of what turned out to be a cystic teratoma. Hysteroscopy was also done at the time due to postmenopausal bleeding. It is unclear  why the left ovary was not taken out since she is postmenopausal.    Diastolic dysfunction    a. 09/2021 Echo: EF 55-60%, no rwma, GrII DD, nl RV fxn, mild MR, triv AI.   Dyspnea    GERD (gastroesophageal reflux disease)    Granuloma annulare    Hyperlipidemia    Hypertension    Menopause    Non-obstructive CAD (coronary artery disease)    a. 07/2021 Cor CTA: LM <25, LAD 25-49p, LCX <25p, RCA nl. Ca2+ = 390 (71st%'ile).   PAF (paroxysmal atrial fibrillation) (HCC)    a. 04/2021 - occured following R knee surgery.  CHA2DS2VASc = 5--> Eliquis .   Plantar fasciitis of right foot 12/24/2014   Right knee DJD 04/03/2012   SCC (squamous cell carcinoma) 10/08/2020   R mid lower pretibia, [facility]   SCC (squamous cell carcinoma) 03/16/2021   R upper pretibial, [facility] 12/14/2021   Squamous cell carcinoma of skin 02/26/2020   R lower pretibial, clear with bx 04/01/20   Squamous cell carcinoma of skin  07/15/2020   R mid pretibia, [facility]   Squamous cell carcinoma of skin 12/09/2020   R lateral pretibia, [facility]   Squamous cell carcinoma of skin 12/14/2021   R thenar hand dorsum, [facility]. Also Mohs 12/29/2021.   Squamous cell carcinoma of skin 11/17/2022   left mid pretibia, [facility]   Past Surgical History:  Procedure Laterality Date   APPENDECTOMY     CATARACT EXTRACTION, BILATERAL     DILATION AND CURETTAGE OF UTERUS     RIGHT OOPHORECTOMY Right 04/08/2012   [name]   SPINE SURGERY  03/08/2008   TOTAL KNEE ARTHROPLASTY Right 04/29/2021   Procedure: TOTAL KNEE ARTHROPLASTY;  Surgeon: [name], MD;  Location: [facility] ORS;  Service: Orthopedics;  Laterality: Right;   VENOUS ABLATION     bilateral   Family History  Problem Relation Age of Onset   Early death Mother        durig childbirth   Heart disease Father    Heart attack Father    Breast cancer Sister 12       half sister   Heart disease Sister    Cancer Brother    Heart disease Brother        Afib   Social History   Occupational History   Not on file  Tobacco Use   Smoking status: Never   Smokeless tobacco: Never  Vaping Use   Vaping status: Never Used  Substance and  Sexual Activity   Alcohol use: No    Alcohol/week: 4.0 standard drinks of alcohol    Types: 4 Glasses of wine per week   Drug use: No   Sexual activity: Not Currently   Tobacco Counseling Counseling given: Not Answered  SDOH Screenings   Food Insecurity: No Food Insecurity (01/31/2024)  Housing: Unknown (01/31/2024)  Transportation Needs: No Transportation Needs (01/31/2024)  Utilities: Not At Risk (01/31/2024)  Alcohol Screen: Low Risk  (01/31/2024)  Depression (PHQ2-9): Low Risk  (01/31/2024)  Financial Resource Strain: Low Risk  (01/31/2024)  Physical Activity: Inactive (01/31/2024)  Social Connections: Socially Isolated (01/31/2024)  Stress: No Stress Concern Present (01/31/2024)  Tobacco Use: Low Risk  (01/31/2024)  Health Literacy:  Adequate Health Literacy (01/31/2024)   See flowsheets for full screening details  Depression Screen Depression Screening Exception Documentation Depression Screening Exception:: Patient refusal  PHQ 2 & 9 Depression Scale- Over the past 2 weeks, how often have you been bothered by any of the following problems? Little interest or pleasure in doing things: 0 Feeling down, depressed, or hopeless (PHQ Adolescent also includes...irritable): 0 PHQ-2 Total Score: 0 Trouble falling or staying asleep, or sleeping too much: 1 Feeling tired or having little energy: 0 Poor appetite or overeating (PHQ Adolescent also includes...weight loss): 0 Feeling bad about yourself - or that you are a failure or have let yourself or your family down: 0 Trouble concentrating on things, such as reading the newspaper or watching television (PHQ Adolescent also includes...like school work): 0 Moving or speaking so slowly that other people could have noticed. Or the opposite - being so fidgety or restless that you have been moving around a lot more than usual: 0 Thoughts that you would be better off dead, or of hurting yourself in some way: 0 PHQ-9 Total Score: 1 If you checked off any problems, how difficult have these problems made it for you to do your work, take care of things at home, or get along with other people?: Not difficult at all     Goals Addressed             This Visit's Progress    Patient Stated       Wants to eat good       Visit info / Clinical Intake: Medicare Wellness Visit Type:: Subsequent Annual Wellness Visit Persons participating in visit:: patient Medicare Wellness Visit Mode:: Telephone If telephone:: video declined Because this visit was a virtual/telehealth visit:: pt reported vitals If Telephone or Video please confirm:: I connected with the patient using audio enabled telemedicine application and verified that I am speaking with the correct person using two identifiers;  I discussed the limitations of evaluation and management by telemedicine; The patient expressed understanding and agreed to proceed Patient Location:: Home Provider Location:: Office/Home Information given by:: patient Interpreter Needed?: No Pre-visit prep was completed: yes AWV questionnaire completed by patient prior to visit?: no Living arrangements:: (!) lives alone Patient's Overall Health Status Rating: good Typical amount of pain: some Does pain affect daily life?: (!) yes Are you currently prescribed opioids?: no  Dietary Habits and Nutritional Risks How many meals a day?: 2 Eats fruit and vegetables daily?: (!) no Most meals are obtained by: eating out In the last 2 weeks, have you had any of the following?: (!) nausea, vomiting, diarrhea (some diarrhea since having a cold, getting better) Diabetic:: no  Functional Status Activities of Daily Living (to include ambulation/medication): Independent Ambulation: Independent with device- listed below Home  Assistive Devices/Equipment: Environmental Consultant (specify Type) (at times) Medication Administration: Independent Home Management: Independent Manage your own finances?: yes Primary transportation is: driving Concerns about vision?: no *vision screening is required for WTM* Concerns about hearing?: no  Fall Screening Falls in the past year?: 1 Number of falls in past year: 0 Was there an injury with Fall?: 1 Fall Risk Category Calculator: 2 Patient Fall Risk Level: Moderate Fall Risk  Fall Risk Patient at Risk for Falls Due to: History of fall(s); Impaired balance/gait Fall risk Follow up: Falls evaluation completed; Falls prevention discussed  Home and Transportation Safety: All rugs have non-skid backing?: yes All stairs or steps have railings?: N/A, no stairs Grab bars in the bathtub or shower?: yes Have non-skid surface in bathtub or shower?: yes Good home lighting?: yes Regular seat belt use?: yes Hospital stays in the  last year:: no  Cognitive Assessment Difficulty concentrating, remembering, or making decisions? : yes Will 6CIT or Mini Cog be Completed: yes What year is it?: 0 points What month is it?: 0 points Give patient an address phrase to remember (5 components): [address] [place] About what time is it?: 0 points Count backwards from 20 to 1: 0 points Say the months of the year in reverse: 0 points Repeat the address phrase from earlier: 4 points 6 CIT Score: 4 points  Advance Directives (For Healthcare) Does Patient Have a Medical Advance Directive?: Yes Does patient want to make changes to medical advance directive?: No - Patient declined Type of Advance Directive: Healthcare Power of [place]; Living will Copy of Healthcare Power of Attorney in Chart?: No - copy requested Copy of Living Will in Chart?: No - copy requested  Reviewed/Updated  Reviewed/Updated: Reviewed All (Medical, Surgical, Family, Medications, Allergies, Care Teams, Patient Goals)        Objective:    Today's Vitals   01/31/24 1503  Weight: 170 lb (77.1 kg)  Height: 5' 2.75 (1.594 m)   Body mass index is 30.35 kg/m.  Current Medications (verified) Outpatient Encounter Medications as of 01/31/2024  Medication Sig   acetaminophen  (TYLENOL ) 325 MG tablet Take 1-2 tablets (325-650 mg total) by mouth every 6 (six) hours as needed for mild pain (pain score 1-3 or temp > 100.5).   amLODipine  (NORVASC ) 5 MG tablet Take 1 tablet (5 mg total) by mouth daily. (Patient taking differently: Take 5 mg by mouth daily as needed.)   cyanocobalamin  (VITAMIN B12) 1000 MCG tablet Take 1,000 mcg by mouth daily.   famotidine  (PEPCID ) 20 MG tablet Take 1 tablet (20 mg total) by mouth 2 (two) times daily. For itching   fluticasone  (FLONASE ) 50 MCG/ACT nasal spray Place 2 sprays into both nostrils daily.   fluticasone -salmeterol (ADVAIR ) 100-50 MCG/ACT AEPB Inhale 1 puff into the lungs 2 (two) times daily as needed.    folic acid  (FOLVITE ) 1 MG tablet Take 1 mg by mouth daily. (Patient taking differently: Take 1 mg by mouth daily. Takes every day except Monday)   losartan  (COZAAR ) 100 MG tablet TAKE 1 TABLET BY MOUTH DAILY   methotrexate (RHEUMATREX) 2.5 MG tablet Take 2.5 mg by mouth once a week. Caution:Chemotherapy. Protect from light. (Patient taking differently: Take 7.5 mg by mouth once a week. Caution:Chemotherapy. Protect from light.)   metoprolol  succinate (TOPROL -XL) 25 MG 24 hr tablet TAKE 1 TABLET BY MOUTH AT  BEDTIME   pantoprazole  (PROTONIX ) 40 MG tablet TAKE 1 TABLET BY MOUTH DAILY   rosuvastatin  (CRESTOR ) 10 MG tablet TAKE 1 TABLET  BY MOUTH DAILY   sertraline  (ZOLOFT ) 100 MG tablet TAKE 1 TABLET BY MOUTH DAILY   Iron , Ferrous Sulfate , 325 (65 Fe) MG TABS Take 325 mg by mouth daily. (Patient not taking: Reported on 01/31/2024)   meclizine  (ANTIVERT ) 12.5 MG tablet Take 1 tablet (12.5 mg total) by mouth 3 (three) times daily as needed for dizziness. (Patient not taking: Reported on 01/31/2024)   triamterene -hydrochlorothiazide  (MAXZIDE-25) 37.5-25 MG tablet Take 1 tablet by mouth daily. (Patient not taking: Reported on 01/31/2024)   VITAMIN D  PO Take 1,000 Int'l Units/day by mouth daily. (Patient not taking: Reported on 01/31/2024)   No facility-administered encounter medications on file as of 01/31/2024.   Hearing/Vision screen Hearing Screening - Comments:: No issues Vision Screening - Comments:: No correction, [facility], up to date Immunizations and Health Maintenance Health Maintenance  Topic Date Due   Zoster Vaccines- Shingrix (1 of 2) 10/22/1954   COVID-19 Vaccine (5 - 2025-26 season) 11/07/2023   DTaP/Tdap/Td (3 - Td or Tdap) 12/07/2023   Medicare Annual Wellness (AWV)  01/30/2025   Pneumococcal Vaccine: 50+ Years  Completed   Influenza Vaccine  Completed   Bone Density Scan  Completed   Meningococcal B Vaccine  Aged Out        Assessment/Plan:  This is a routine wellness  examination for [place].  Patient Care Team: Marylynn Verneita CROME, MD as PCP - General (Internal Medicine) Perla Evalene PARAS, MD as PCP - Cardiology (Cardiology)  I have personally reviewed and noted the following in the patient's chart:   Medical and social history Use of alcohol, tobacco or illicit drugs  Current medications and supplements including opioid prescriptions. Functional ability and status Nutritional status Physical activity Advanced directives List of other physicians Hospitalizations, surgeries, and ER visits in previous 12 months Vitals Screenings to include cognitive, depression, and falls Referrals and appointments  No orders of the defined types were placed in this encounter.  In addition, I have reviewed and discussed with patient certain preventive protocols, quality metrics, and best practice recommendations. A written personalized care plan for preventive services as well as general preventive health recommendations were provided to patient.   Angeline Fredericks, LPN   [id number]   Return in 1 year (on 01/30/2025).  After Visit Summary: (MyChart) Due to this being a telephonic visit, the after visit summary with patients personalized plan was offered to patient via MyChart   Nurse Notes: Mammogram order placed. Discussed the need to update shingles and tetanus vaccines. Patient declines covid vaccine. Phone note sent to PCP.

## 2024-01-31 NOTE — Telephone Encounter (Signed)
 Performed AWV  While reviewing patient's medication she stated that she has not been taking Amlodipine  but not sure for how long. Patient stated that her blood pressure has been fine when she checks it. Patient stated she decided to just take Amlodipine  when she feels her blood pressure is high. Patient took her blood pressure while on the phone and it was 164/73. Patient gave me several readings 01/23/24 BP 157/74, 01/15/24 BP 138/73 and 01/11/24 BP 154/76. Patient was advised that she should be taking any medications as prescribed by her doctor. Advised patient that a note would be sent back to Dr. Tullo for her to be aware.  Patient also stated that she has had diarrhea since she had a cold last month but she thinks that it is getting better.

## 2024-01-31 NOTE — Telephone Encounter (Signed)
 Pt is aware and gave a verbal understanding.

## 2024-02-06 NOTE — Progress Notes (Signed)
  I connected with  Jamie Anderson on 01/31/24 by a audio enabled telemedicine application and verified that I am speaking with the correct person using two identifiers.  Patient Location: Home  Provider Location: Home Office  Persons Participating in Visit: Patient.  I discussed the limitations of evaluation and management by telemedicine. The patient expressed understanding and agreed to proceed.   Vital Signs: Because this visit was a virtual/telehealth visit, some criteria may be missing or patient reported. Any vitals not documented were not able to be obtained and vitals that have been documented are patient reported.

## 2024-03-05 ENCOUNTER — Encounter: Payer: Self-pay | Admitting: Internal Medicine

## 2024-03-05 ENCOUNTER — Ambulatory Visit (INDEPENDENT_AMBULATORY_CARE_PROVIDER_SITE_OTHER): Admitting: Internal Medicine

## 2024-03-05 VITALS — BP 130/73 | HR 87 | Ht 62.75 in | Wt 178.0 lb

## 2024-03-05 DIAGNOSIS — G8929 Other chronic pain: Secondary | ICD-10-CM | POA: Diagnosis not present

## 2024-03-05 DIAGNOSIS — E785 Hyperlipidemia, unspecified: Secondary | ICD-10-CM

## 2024-03-05 DIAGNOSIS — R7301 Impaired fasting glucose: Secondary | ICD-10-CM

## 2024-03-05 DIAGNOSIS — D509 Iron deficiency anemia, unspecified: Secondary | ICD-10-CM | POA: Diagnosis not present

## 2024-03-05 DIAGNOSIS — J479 Bronchiectasis, uncomplicated: Secondary | ICD-10-CM | POA: Diagnosis not present

## 2024-03-05 DIAGNOSIS — I480 Paroxysmal atrial fibrillation: Secondary | ICD-10-CM | POA: Diagnosis not present

## 2024-03-05 DIAGNOSIS — N1831 Chronic kidney disease, stage 3a: Secondary | ICD-10-CM | POA: Diagnosis not present

## 2024-03-05 DIAGNOSIS — I10 Essential (primary) hypertension: Secondary | ICD-10-CM | POA: Diagnosis not present

## 2024-03-05 DIAGNOSIS — R21 Rash and other nonspecific skin eruption: Secondary | ICD-10-CM

## 2024-03-05 DIAGNOSIS — M25562 Pain in left knee: Secondary | ICD-10-CM | POA: Diagnosis not present

## 2024-03-05 DIAGNOSIS — J42 Unspecified chronic bronchitis: Secondary | ICD-10-CM

## 2024-03-05 MED ORDER — AMLODIPINE BESYLATE 5 MG PO TABS: 5.0000 mg | ORAL_TABLET | Freq: Every day | ORAL | 1 refills | Status: AC

## 2024-03-05 NOTE — Progress Notes (Unsigned)
 "  Subjective:  Patient ID: Jamie Anderson, female    DOB: 1935/09/19  Age: 88 y.o. MRN: [id number]  CC: The primary encounter diagnosis was Essential hypertension. Diagnoses of Hyperlipidemia LDL goal <70, Iron  deficiency anemia, unspecified iron  deficiency anemia type, and Impaired fasting glucose were also pertinent to this visit.   HPI Jamie Anderson presents for  Chief Complaint  Patient presents with   Medical Management of Chronic Issues    3 month follow up     Prurigo nodularis ;  prescribed  7.5 mg methotrexate by Dermatologist Isenstein  filled Nov 19 .  Itching has improved   Sister in hospital with flu and pneumonia     Recall of 2 /3 items at 2 minutes.  Notices shoe short term memory deficits.  Forgetting the names of meds. Using a pill box   Hypertension: patient checks blood pressure twice weekly at home.  Readings have been f doen but not brought to visit.  Nephrolog reading last week was 130/80  or the most part <130/80 at rest . Patient is following a reduced salt diet most days and is taking medications as prescribed    Left knee pain : completed 3 gel injections  by [facility] without improvement in pain ,  using tylenol  .  Wants to see [facility]    Outpatient Medications Prior to Visit  Medication Sig Dispense Refill   acetaminophen  (TYLENOL ) 325 MG tablet Take 1-2 tablets (325-650 mg total) by mouth every 6 (six) hours as needed for mild pain (pain score 1-3 or temp > 100.5). 40 tablet 1   amLODipine  (NORVASC ) 5 MG tablet Take 1 tablet (5 mg total) by mouth daily. (Patient taking differently: Take 5 mg by mouth daily as needed.) 30 tablet 2   cyanocobalamin  (VITAMIN B12) 1000 MCG tablet Take 1,000 mcg by mouth daily.     fluticasone  (FLONASE ) 50 MCG/ACT nasal spray Place 2 sprays into both nostrils daily. 16 g 6   fluticasone -salmeterol (ADVAIR) 100-50 MCG/ACT AEPB Inhale 1 puff into the lungs 2 (two) times daily as needed. 180 each 1   folic acid   (FOLVITE ) 1 MG tablet Take 1 mg by mouth daily. (Patient taking differently: Take 1 mg by mouth daily. Takes every day except Monday)     losartan  (COZAAR ) 100 MG tablet TAKE 1 TABLET BY MOUTH DAILY 30 tablet 11   methotrexate (RHEUMATREX) 2.5 MG tablet Take 2.5 mg by mouth once a week. Caution:Chemotherapy. Protect from light. (Patient taking differently: Take 7.5 mg by mouth once a week. Caution:Chemotherapy. Protect from light.)     metoprolol  succinate (TOPROL -XL) 25 MG 24 hr tablet TAKE 1 TABLET BY MOUTH AT  BEDTIME 100 tablet 2   pantoprazole  (PROTONIX ) 40 MG tablet TAKE 1 TABLET BY MOUTH DAILY 100 tablet 2   rosuvastatin  (CRESTOR ) 10 MG tablet TAKE 1 TABLET BY MOUTH DAILY 100 tablet 2   sertraline  (ZOLOFT ) 100 MG tablet TAKE 1 TABLET BY MOUTH DAILY 90 tablet 3   triamterene -hydrochlorothiazide  (MAXZIDE-25) 37.5-25 MG tablet Take 1 tablet by mouth daily. (Patient not taking: Reported on 01/31/2024) 90 tablet 3   VITAMIN D  PO Take 1,000 Int'l Units/day by mouth daily. (Patient not taking: Reported on 01/31/2024)     famotidine  (PEPCID ) 20 MG tablet Take 1 tablet (20 mg total) by mouth 2 (two) times daily. For itching (Patient not taking: Reported on 03/05/2024) 180 tablet 2   Iron , Ferrous Sulfate , 325 (65 Fe) MG TABS Take 325 mg by  mouth daily. (Patient not taking: Reported on 03/05/2024) 30 tablet 2   meclizine  (ANTIVERT ) 12.5 MG tablet Take 1 tablet (12.5 mg total) by mouth 3 (three) times daily as needed for dizziness. (Patient not taking: Reported on 03/05/2024) 60 tablet 2   No facility-administered medications prior to visit.    Review of Systems;  Patient denies headache, fevers, malaise, unintentional weight loss, skin rash, eye pain, sinus congestion and sinus pain, sore throat, dysphagia,  hemoptysis , cough, dyspnea, wheezing, chest pain, palpitations, orthopnea, edema, abdominal pain, nausea, melena, diarrhea, constipation, flank pain, dysuria, hematuria, urinary  Frequency,  nocturia, numbness, tingling, seizures,  Focal weakness, Loss of consciousness,  Tremor, insomnia, depression, anxiety, and suicidal ideation.      Objective:  BP (!) 162/62   Pulse 87   Ht 5' 2.75 (1.594 m)   Wt 178 lb (80.7 kg)   SpO2 95%   BMI 31.78 kg/m   BP Readings from Last 3 Encounters:  03/05/24 (!) 162/62  01/04/24 (!) 108/52  12/02/23 (!) 140/64    Wt Readings from Last 3 Encounters:  03/05/24 178 lb (80.7 kg)  01/31/24 170 lb (77.1 kg)  01/04/24 173 lb 12.8 oz (78.8 kg)    Physical Exam  Lab Results  Component Value Date   HGBA1C 5.6 07/19/2022   HGBA1C 5.5 01/30/2018   HGBA1C 5.4 01/15/2015    Lab Results  Component Value Date   CREATININE 1.01 (H) 11/20/2023   CREATININE 1.02 07/20/2023   CREATININE 1.16 12/30/2022    Lab Results  Component Value Date   WBC 5.7 11/20/2023   HGB 12.0 11/20/2023   HCT 35.2 (L) 11/20/2023   PLT 186 11/20/2023   GLUCOSE 112 (H) 11/20/2023   CHOL 237 (H) 07/20/2023   TRIG 87.0 07/20/2023   HDL 67.40 07/20/2023   LDLDIRECT 143.0 07/20/2023   LDLCALC 152 (H) 07/20/2023   ALT 13 07/20/2023   AST 17 07/20/2023   NA 140 11/20/2023   K 4.1 11/20/2023   CL 106 11/20/2023   CREATININE 1.01 (H) 11/20/2023   BUN 24 (H) 11/20/2023   CO2 22 11/20/2023   TSH 1.41 07/20/2023   INR 1.2 11/20/2023   HGBA1C 5.6 07/19/2022   MICROALBUR 1.5 09/27/2022    CT Cervical Spine Wo Contrast Result Date: 11/20/2023 CLINICAL DATA:  88 year old female status post unwitnessed fall at home, found down. On Eliquis . Struck head, hematoma. EXAM: CT CERVICAL SPINE WITHOUT CONTRAST TECHNIQUE: Multidetector CT imaging of the cervical spine was performed without intravenous contrast. Multiplanar CT image reconstructions were also generated. RADIATION DOSE REDUCTION: This exam was performed according to the departmental dose-optimization program which includes automated exposure control, adjustment of the mA and/or kV according to patient size  and/or use of iterative reconstruction technique. COMPARISON:  Head CT today.  Cervical spine MRI 11/14/2018. FINDINGS: Alignment: Straightening of cervical lordosis is chronic and stable. Cervicothoracic junction alignment is within normal limits. Bilateral posterior element alignment is within normal limits. Skull base and vertebrae: Bone mineralization is within normal limits. Visualized skull base is intact. No atlanto-occipital dissociation. C1 and C2 are chronically degenerated but intact and aligned. No acute osseous abnormality identified. Soft tissues and spinal canal: No prevertebral fluid or swelling. No visible canal hematoma. Bulky cervical carotid calcified atherosclerosis, including evidence of a radiographic string sign on the right series 4, image 40. Otherwise negative visible noncontrast neck soft tissues. Disc levels: Bulky and heavily calcified ligamentous hypertrophy about the odontoid. Advanced chronic anterior C1-odontoid degeneration including vacuum  phenomena. Similar extensive degenerative calcification of cervical intervertebral discs (C2-C3 through C4-C5) and intermittently affecting the ligament flavum (C2-C3). Advanced chronic disc and bulky endplate spurring in the lower cervical spine. Multilevel mild spinal stenosis suspected. Upper chest: Visible upper thoracic levels appear intact. Mild apical lung scarring. IMPRESSION: 1. No acute traumatic injury identified in the cervical spine. 2. Advanced cervical spine degeneration, especially calcified bulky ligamentous hypertrophy, calcified disc bulging. 3. Advanced calcified atherosclerosis, including noncontrast evidence of a Right ICA Radiographic-String-Sign stenosis. Carotid Doppler Ultrasound would be the simplest way to evaluate further. Electronically Signed   By: VEAR Hurst M.D.   On: 11/20/2023 08:16   CT Head Wo Contrast Result Date: 11/20/2023 CLINICAL DATA:  88 year old female status post unwitnessed fall at home, found down.  On Eliquis . Struck head, hematoma. EXAM: CT HEAD WITHOUT CONTRAST TECHNIQUE: Contiguous axial images were obtained from the base of the skull through the vertex without intravenous contrast. RADIATION DOSE REDUCTION: This exam was performed according to the departmental dose-optimization program which includes automated exposure control, adjustment of the mA and/or kV according to patient size and/or use of iterative reconstruction technique. COMPARISON:  None Available. FINDINGS: Brain: Normal cerebral volume for age. No midline shift, ventriculomegaly, mass effect, evidence of mass lesion, intracranial hemorrhage or evidence of cortically based acute infarction. Minimal to mild for age patchy periventricular white matter hypodensity. Otherwise maintained gray-white differentiation. Vascular: Advanced Calcified atherosclerosis at the skull base. No suspicious intracranial vascular hyperdensity. Skull: Intact. No fracture identified. Advanced C1-C2 degeneration partially visible, cervical spine today reported separately. Sinuses/Orbits: Visualized paranasal sinuses and mastoids are clear. Other: No acute orbit or scalp soft tissue injury is identified. IMPRESSION: 1. No acute traumatic injury identified. 2. Normal for age noncontrast CT appearance of the brain. 3. Advanced calcified atherosclerosis. Electronically Signed   By: VEAR Hurst M.D.   On: 11/20/2023 08:12    Assessment & Plan:  .Essential hypertension  Hyperlipidemia LDL goal <70  Iron  deficiency anemia, unspecified iron  deficiency anemia type  Impaired fasting glucose     I spent 34 minutes on the day of this face to face encounter reviewing patient's  most recent visit with cardiology,  nephrology,  and neurology,  prior relevant surgical and non surgical procedures, recent  labs and imaging studies, counseling on weight management,  reviewing the assessment and plan with patient, and post visit ordering and reviewing of  diagnostics and  therapeutics with patient  .   Follow-up: No follow-ups on file.   Jamie LITTIE Kettering, MD "

## 2024-03-05 NOTE — Assessment & Plan Note (Signed)
 GFR is stable at 59 ml/min per nephrology eval in Dec 2025.  She has requested to defer an y more nephrology appointments

## 2024-03-05 NOTE — Patient Instructions (Addendum)
 YOUR MAMMOGRAM IS DUE, PLEASE CALL AND GET THIS SCHEDULED! [facility] - call [phone]   I have made a referral to Dr mardee for your knee pain.  You can take 3000 mg of acetominophen (tylenol ) every day safely  In divided doses (750 mg  every 6 hours  Or 1000 mg every 8 hours.)   Please return in April for labs and follow up

## 2024-03-06 DIAGNOSIS — G8929 Other chronic pain: Secondary | ICD-10-CM | POA: Insufficient documentation

## 2024-03-06 NOTE — Assessment & Plan Note (Signed)
 Well controlled on current regimen of losartan  and metoprolol  XL based on last week's BP measurement at nephrology .   Renal function  is stable , no changes today.

## 2024-03-06 NOTE — Assessment & Plan Note (Signed)
 She has Mild bronchiectasis and chronic bronchitis.  Patient has  resumed use of Advair and symptoms have resolved.

## 2024-03-06 NOTE — Assessment & Plan Note (Signed)
 Advised to  continue regular use of  LABA /steroid MDI.

## 2024-03-06 NOTE — Assessment & Plan Note (Signed)
"    She has stopped taking Eliquis  for embolic stroke risk mitigation due to  atrial fibrillation. Due to  history of mechanical falls int he past 5 months making her at increased risk for ICH with continued antiocoagulation.  "

## 2024-03-06 NOTE — Assessment & Plan Note (Signed)
 Managed by dermatology with methotrexate

## 2024-03-06 NOTE — Assessment & Plan Note (Signed)
 Secondary to DJD.  No relief of pain with 3 gelvisc injections done by [facility] and her pain and restricted ROM are impacting her daily life .  Second opinion requested by patient. Referring to Dr Mardee

## 2024-03-17 ENCOUNTER — Other Ambulatory Visit: Payer: Self-pay | Admitting: Internal Medicine

## 2024-03-17 DIAGNOSIS — M25561 Pain in right knee: Secondary | ICD-10-CM

## 2024-04-04 ENCOUNTER — Ambulatory Visit: Payer: Self-pay

## 2024-04-04 NOTE — Telephone Encounter (Signed)
 Noted pt is scheduled to see kaur on tomorrow

## 2024-04-04 NOTE — Telephone Encounter (Signed)
 Patient's daughter will call back to schedule virtual UC  FYI Only or Action Required?: Action required by provider: request for appointment and update on patient condition.   Patient was last seen in primary care on 03/05/2024 by Marylynn Verneita CROME, MD.  Called Nurse Triage reporting Cough.  Symptoms began a week ago.  Interventions attempted: OTC medications: robitussin and Prescription medications: albuterol  MDI.  Symptoms are: unchanged.  Triage Disposition: See Physician Within 24 Hours  Patient/caregiver understands and will follow disposition?: Yes    Message from Rea ORN sent at 04/04/2024  2:46 PM EST  Reason for Triage: SOB when laying on right side. Pt also has a cough. Pt said she found an old inhaler that expired last July. She said it is helping and she would like another rx    Reason for Disposition  [1] Continuous (nonstop) coughing interferes with work or school AND [2] no improvement using cough treatment per Care Advice  Answer Assessment - Initial Assessment Questions 1. ONSET: When did the cough begin?      One week ago 2. SEVERITY: How bad is the cough today?      Talking makes cough worse 3. SPUTUM: Describe the color of your sputum (e.g., none, dry cough; clear, white, yellow, green)     Creamy colored at times, but not always able to cough up congestion 4. HEMOPTYSIS: Are you coughing up any blood? If Yes, ask: How much? (e.g., flecks, streaks, tablespoons, etc.)     denies 5. DIFFICULTY BREATHING: Are you having difficulty breathing? If Yes, ask: How bad is it? (e.g., mild, moderate, severe)      Sometimes has difficulty breathing after coughing fits 6. FEVER: Do you have a fever? If Yes, ask: What is your temperature, how was it measured, and when did it start?     denies 7. CARDIAC HISTORY: Do you have any history of heart disease? (e.g., heart attack, congestive heart failure)      htn 8. LUNG HISTORY: Do you have any history of  lung disease?  (e.g., pulmonary embolus, asthma, emphysema)     bronchiectasis  10. OTHER SYMPTOMS: Do you have any other symptoms? (e.g., runny nose, wheezing, chest pain)       Runny nose at times  Protocols used: Cough - Acute Non-Productive-A-AH

## 2024-04-05 ENCOUNTER — Encounter: Payer: Self-pay | Admitting: Nurse Practitioner

## 2024-04-05 ENCOUNTER — Ambulatory Visit: Admitting: Nurse Practitioner

## 2024-04-05 ENCOUNTER — Telehealth: Admitting: Nurse Practitioner

## 2024-04-05 VITALS — Ht 62.75 in | Wt 178.0 lb

## 2024-04-05 DIAGNOSIS — J069 Acute upper respiratory infection, unspecified: Secondary | ICD-10-CM | POA: Diagnosis not present

## 2024-04-05 MED ORDER — ALBUTEROL SULFATE HFA 108 (90 BASE) MCG/ACT IN AERS: 2.0000 | INHALATION_SPRAY | Freq: Four times a day (QID) | RESPIRATORY_TRACT | 0 refills | Status: AC | PRN

## 2024-04-05 MED ORDER — PREDNISONE 20 MG PO TABS: 20.0000 mg | ORAL_TABLET | Freq: Every day | ORAL | 0 refills | Status: AC

## 2024-04-05 MED ORDER — AZITHROMYCIN 250 MG PO TABS: ORAL_TABLET | ORAL | 0 refills | Status: AC

## 2024-04-05 NOTE — Progress Notes (Signed)
 "  Virtual Visit via Video Note  I connected with Jamie Anderson on 04/06/24 at 1:28 PM by a video enabled telemedicine application and verified that I am speaking with the correct person using two identifiers.  Patient Location: Home Provider Location: Office/Clinic  I discussed the limitations, risks, security, and privacy concerns of performing an evaluation and management service by video and the availability of in person appointments. I also discussed with the patient that there may be a patient responsible charge related to this service. The patient expressed understanding and agreed to proceed.  Subjective: PCP: Marylynn Verneita LITTIE, MD  Chief Complaint  Patient presents with   Acute Visit    Cough, chills & intermittent sore throat   HPI  Jamie Anderson is an 89 year old female who presents with a persistent cough. Her daughter is with her during the visit.   For the past week she has had a severe, heavy cough that worsens when she lies on her right side, causes a choking sensation, and often prevents her from talking or sleeping flat so she props herself up. She has been taking Robitussin and Tylenol  and has also used an expired albuterol  inhaler.  She reports feeling warm but has no thermometer, and denies fever, chills, sinus pressure, or sinus pain. She has mild runny nose, slight sore throat, intermittent headaches, and shortness of breath only with intense coughing. She lives alone and had recent contact with a friend who had bronchitis. She is allergic to amoxicillin  and other penicillins, which cause hives and nausea, and she notes prednisone  has helped her symptoms in the past.   ROS: Per HPI Current Medications[1]  Observations/Objective: Today's Vitals   04/05/24 1606  Weight: 178 lb (80.7 kg)  Height: 5' 2.75 (1.594 m)   Physical Exam Constitutional:      General: She is not in acute distress.    Appearance: Normal appearance. She is not ill-appearing.  Eyes:      Conjunctiva/sclera: Conjunctivae normal.  Pulmonary:     Effort: No respiratory distress.  Neurological:     Mental Status: She is alert.  Psychiatric:        Mood and Affect: Mood normal.        Behavior: Behavior normal.        Thought Content: Thought content normal.        Judgment: Judgment normal.     Assessment and Plan: URI with cough and congestion Assessment & Plan: Pt is, non toxic appearing, without respiratoty distress.  -Will treat with z-pack and prednisone . - Refilled albuterol  inhaler. - Advised probiotics or yogurt to mitigate stomach discomfort. - Increase fluid intake and rest. - Instructed to seek in person evaluation if symptoms worsen or persist.    Other orders -     Azithromycin ; Take 2 tablets on day 1, then 1 tablet daily on days 2 through 5  Dispense: 6 tablet; Refill: 0 -     predniSONE ; Take 1 tablet (20 mg total) by mouth daily with breakfast for 5 days.  Dispense: 5 tablet; Refill: 0 -     Albuterol  Sulfate HFA; Inhale 2 puffs into the lungs every 6 (six) hours as needed for wheezing or shortness of breath.  Dispense: 8 g; Refill: 0    Follow Up Instructions: No follow-ups on file.   I discussed the assessment and treatment plan with the patient. The patient was provided an opportunity to ask questions, and all were answered. The patient agreed with  the plan and demonstrated an understanding of the instructions.   The patient was advised to call back or seek an in-person evaluation if the symptoms worsen or if the condition fails to improve as anticipated.  The above assessment and management plan was discussed with the patient. The patient verbalized understanding of and has agreed to the management plan.   Cori Henningsen, NP     [1]  Current Outpatient Medications:    acetaminophen  (TYLENOL ) 325 MG tablet, Take 1-2 tablets (325-650 mg total) by mouth every 6 (six) hours as needed for mild pain (pain score 1-3 or temp > 100.5).,  Disp: 40 tablet, Rfl: 1   albuterol  (VENTOLIN  HFA) 108 (90 Base) MCG/ACT inhaler, Inhale 2 puffs into the lungs every 6 (six) hours as needed for wheezing or shortness of breath., Disp: 8 g, Rfl: 0   amLODipine  (NORVASC ) 5 MG tablet, Take 1 tablet (5 mg total) by mouth daily., Disp: 90 tablet, Rfl: 1   azithromycin  (ZITHROMAX ) 250 MG tablet, Take 2 tablets on day 1, then 1 tablet daily on days 2 through 5, Disp: 6 tablet, Rfl: 0   cyanocobalamin  (VITAMIN B12) 1000 MCG tablet, Take 1,000 mcg by mouth daily., Disp: , Rfl:    fluticasone  (FLONASE ) 50 MCG/ACT nasal spray, Place 2 sprays into both nostrils daily., Disp: 16 g, Rfl: 6   fluticasone -salmeterol (ADVAIR) 100-50 MCG/ACT AEPB, Inhale 1 puff into the lungs 2 (two) times daily as needed., Disp: 180 each, Rfl: 1   folic acid  (FOLVITE ) 1 MG tablet, Take 1 mg by mouth daily. (Patient taking differently: Take 1 mg by mouth daily. Takes every day except Monday), Disp: , Rfl:    losartan  (COZAAR ) 100 MG tablet, TAKE 1 TABLET BY MOUTH DAILY, Disp: 30 tablet, Rfl: 11   methotrexate (RHEUMATREX) 2.5 MG tablet, Take 2.5 mg by mouth once a week. Caution:Chemotherapy. Protect from light. (Patient taking differently: Take 7.5 mg by mouth once a week. Caution:Chemotherapy. Protect from light.), Disp: , Rfl:    metoprolol  succinate (TOPROL -XL) 25 MG 24 hr tablet, TAKE 1 TABLET BY MOUTH AT  BEDTIME, Disp: 100 tablet, Rfl: 2   pantoprazole  (PROTONIX ) 40 MG tablet, TAKE 1 TABLET BY MOUTH DAILY, Disp: 100 tablet, Rfl: 2   predniSONE  (DELTASONE ) 20 MG tablet, Take 1 tablet (20 mg total) by mouth daily with breakfast for 5 days., Disp: 5 tablet, Rfl: 0   rosuvastatin  (CRESTOR ) 10 MG tablet, TAKE 1 TABLET BY MOUTH DAILY, Disp: 100 tablet, Rfl: 2   sertraline  (ZOLOFT ) 100 MG tablet, TAKE 1 TABLET BY MOUTH DAILY, Disp: 90 tablet, Rfl: 1   triamterene -hydrochlorothiazide  (MAXZIDE-25) 37.5-25 MG tablet, Take 1 tablet by mouth daily., Disp: 90 tablet, Rfl: 3  "

## 2024-04-06 DIAGNOSIS — J069 Acute upper respiratory infection, unspecified: Secondary | ICD-10-CM | POA: Insufficient documentation

## 2024-04-06 NOTE — Assessment & Plan Note (Addendum)
 Pt is, non toxic appearing, without respiratoty distress.  -Will treat with z-pack and prednisone . - Refilled albuterol  inhaler. - Advised probiotics or yogurt to mitigate stomach discomfort. - Increase fluid intake and rest. - Instructed to seek in person evaluation if symptoms worsen or persist.

## 2024-04-20 ENCOUNTER — Encounter

## 2024-07-05 ENCOUNTER — Other Ambulatory Visit

## 2025-02-06 ENCOUNTER — Ambulatory Visit
